# Patient Record
Sex: Male | Born: 1960 | Race: White | Hispanic: No | Marital: Single | Smoking: Never smoker
Health system: Southern US, Community
[De-identification: ages and names within clinical notes are randomized; demographics above are authoritative.]

## PROBLEM LIST (undated history)

## (undated) DIAGNOSIS — N481 Balanitis: Secondary | ICD-10-CM

## (undated) DIAGNOSIS — I10 Essential (primary) hypertension: Secondary | ICD-10-CM

## (undated) DIAGNOSIS — E119 Type 2 diabetes mellitus without complications: Secondary | ICD-10-CM

## (undated) DIAGNOSIS — K589 Irritable bowel syndrome without diarrhea: Secondary | ICD-10-CM

## (undated) DIAGNOSIS — L0291 Cutaneous abscess, unspecified: Secondary | ICD-10-CM

## (undated) DIAGNOSIS — J189 Pneumonia, unspecified organism: Secondary | ICD-10-CM

## (undated) DIAGNOSIS — L0390 Cellulitis, unspecified: Secondary | ICD-10-CM

## (undated) DIAGNOSIS — R7881 Bacteremia: Secondary | ICD-10-CM

## (undated) HISTORY — DX: Bacteremia: R78.81

## (undated) HISTORY — DX: Cutaneous abscess, unspecified: L02.91

## (undated) HISTORY — PX: TONSILLECTOMY: SUR1361

## (undated) HISTORY — DX: Cellulitis, unspecified: L03.90

## (undated) HISTORY — DX: Balanitis: N48.1

---

## 2008-01-23 ENCOUNTER — Encounter: Admission: RE | Admit: 2008-01-23 | Discharge: 2008-01-23 | Payer: Self-pay | Admitting: Family Medicine

## 2011-04-19 DIAGNOSIS — K760 Fatty (change of) liver, not elsewhere classified: Secondary | ICD-10-CM

## 2011-04-19 HISTORY — DX: Fatty (change of) liver, not elsewhere classified: K76.0

## 2013-03-21 DIAGNOSIS — I2699 Other pulmonary embolism without acute cor pulmonale: Secondary | ICD-10-CM

## 2013-03-21 HISTORY — DX: Other pulmonary embolism without acute cor pulmonale: I26.99

## 2015-04-19 DIAGNOSIS — K219 Gastro-esophageal reflux disease without esophagitis: Secondary | ICD-10-CM | POA: Diagnosis present

## 2015-11-11 ENCOUNTER — Inpatient Hospital Stay (HOSPITAL_BASED_OUTPATIENT_CLINIC_OR_DEPARTMENT_OTHER)
Admission: EM | Admit: 2015-11-11 | Discharge: 2015-11-17 | DRG: 871 | Disposition: A | Discharge status: Home Health | Payer: Medicare Other | Attending: Internal Medicine | Admitting: Internal Medicine

## 2015-11-11 ENCOUNTER — Emergency Department (HOSPITAL_BASED_OUTPATIENT_CLINIC_OR_DEPARTMENT_OTHER): Payer: Medicare Other

## 2015-11-11 ENCOUNTER — Encounter (HOSPITAL_BASED_OUTPATIENT_CLINIC_OR_DEPARTMENT_OTHER): Payer: Self-pay | Admitting: *Deleted

## 2015-11-11 DIAGNOSIS — Z794 Long term (current) use of insulin: Secondary | ICD-10-CM | POA: Diagnosis not present

## 2015-11-11 DIAGNOSIS — M793 Panniculitis, unspecified: Secondary | ICD-10-CM | POA: Diagnosis present

## 2015-11-11 DIAGNOSIS — L03311 Cellulitis of abdominal wall: Secondary | ICD-10-CM | POA: Diagnosis present

## 2015-11-11 DIAGNOSIS — L304 Erythema intertrigo: Secondary | ICD-10-CM | POA: Diagnosis present

## 2015-11-11 DIAGNOSIS — I4891 Unspecified atrial fibrillation: Secondary | ICD-10-CM | POA: Diagnosis present

## 2015-11-11 DIAGNOSIS — A4102 Sepsis due to Methicillin resistant Staphylococcus aureus: Secondary | ICD-10-CM | POA: Diagnosis present

## 2015-11-11 DIAGNOSIS — A419 Sepsis, unspecified organism: Secondary | ICD-10-CM | POA: Diagnosis present

## 2015-11-11 DIAGNOSIS — K589 Irritable bowel syndrome without diarrhea: Secondary | ICD-10-CM | POA: Diagnosis present

## 2015-11-11 DIAGNOSIS — I10 Essential (primary) hypertension: Secondary | ICD-10-CM | POA: Diagnosis present

## 2015-11-11 DIAGNOSIS — Z86711 Personal history of pulmonary embolism: Secondary | ICD-10-CM

## 2015-11-11 DIAGNOSIS — Z6839 Body mass index (BMI) 39.0-39.9, adult: Secondary | ICD-10-CM | POA: Diagnosis not present

## 2015-11-11 DIAGNOSIS — E1165 Type 2 diabetes mellitus with hyperglycemia: Secondary | ICD-10-CM | POA: Diagnosis present

## 2015-11-11 DIAGNOSIS — R7881 Bacteremia: Secondary | ICD-10-CM

## 2015-11-11 DIAGNOSIS — E119 Type 2 diabetes mellitus without complications: Secondary | ICD-10-CM

## 2015-11-11 DIAGNOSIS — R600 Localized edema: Secondary | ICD-10-CM | POA: Diagnosis present

## 2015-11-11 DIAGNOSIS — B377 Candidal sepsis: Secondary | ICD-10-CM | POA: Diagnosis present

## 2015-11-11 DIAGNOSIS — Z7901 Long term (current) use of anticoagulants: Secondary | ICD-10-CM | POA: Diagnosis not present

## 2015-11-11 DIAGNOSIS — R21 Rash and other nonspecific skin eruption: Secondary | ICD-10-CM | POA: Diagnosis present

## 2015-11-11 DIAGNOSIS — E0865 Diabetes mellitus due to underlying condition with hyperglycemia: Secondary | ICD-10-CM | POA: Diagnosis not present

## 2015-11-11 DIAGNOSIS — B9562 Methicillin resistant Staphylococcus aureus infection as the cause of diseases classified elsewhere: Secondary | ICD-10-CM | POA: Diagnosis not present

## 2015-11-11 DIAGNOSIS — Z452 Encounter for adjustment and management of vascular access device: Secondary | ICD-10-CM

## 2015-11-11 DIAGNOSIS — B379 Candidiasis, unspecified: Secondary | ICD-10-CM | POA: Diagnosis present

## 2015-11-11 DIAGNOSIS — Z7984 Long term (current) use of oral hypoglycemic drugs: Secondary | ICD-10-CM | POA: Diagnosis not present

## 2015-11-11 DIAGNOSIS — Z79899 Other long term (current) drug therapy: Secondary | ICD-10-CM | POA: Diagnosis not present

## 2015-11-11 HISTORY — DX: Irritable bowel syndrome, unspecified: K58.9

## 2015-11-11 HISTORY — DX: Essential (primary) hypertension: I10

## 2015-11-11 HISTORY — DX: Pneumonia, unspecified organism: J18.9

## 2015-11-11 HISTORY — DX: Type 2 diabetes mellitus without complications: E11.9

## 2015-11-11 HISTORY — DX: Sepsis, unspecified organism: A41.9

## 2015-11-11 LAB — APTT: APTT: 31 s (ref 24–36)

## 2015-11-11 LAB — CBC WITH DIFFERENTIAL/PLATELET
BASOS PCT: 0 %
Basophils Absolute: 0 10*3/uL (ref 0.0–0.1)
Eosinophils Absolute: 0.2 10*3/uL (ref 0.0–0.7)
Eosinophils Relative: 1 %
HEMATOCRIT: 44.6 % (ref 39.0–52.0)
Hemoglobin: 15.3 g/dL (ref 13.0–17.0)
LYMPHS ABS: 1.7 10*3/uL (ref 0.7–4.0)
Lymphocytes Relative: 12 %
MCH: 32.1 pg (ref 26.0–34.0)
MCHC: 34.3 g/dL (ref 30.0–36.0)
MCV: 93.7 fL (ref 78.0–100.0)
MONO ABS: 0.9 10*3/uL (ref 0.1–1.0)
MONOS PCT: 6 %
NEUTROS ABS: 11.1 10*3/uL — AB (ref 1.7–7.7)
Neutrophils Relative %: 81 %
Platelets: 194 10*3/uL (ref 150–400)
RBC: 4.76 MIL/uL (ref 4.22–5.81)
RDW: 12.9 % (ref 11.5–15.5)
WBC: 13.8 10*3/uL — ABNORMAL HIGH (ref 4.0–10.5)

## 2015-11-11 LAB — SEDIMENTATION RATE: Sed Rate: 13 mm/hr (ref 0–16)

## 2015-11-11 LAB — COMPREHENSIVE METABOLIC PANEL
ALBUMIN: 4.2 g/dL (ref 3.5–5.0)
ALK PHOS: 76 U/L (ref 38–126)
ALT: 96 U/L — ABNORMAL HIGH (ref 17–63)
ANION GAP: 11 (ref 5–15)
AST: 67 U/L — ABNORMAL HIGH (ref 15–41)
BILIRUBIN TOTAL: 0.8 mg/dL (ref 0.3–1.2)
BUN: 15 mg/dL (ref 6–20)
CO2: 23 mmol/L (ref 22–32)
Calcium: 9.9 mg/dL (ref 8.9–10.3)
Chloride: 101 mmol/L (ref 101–111)
Creatinine, Ser: 0.72 mg/dL (ref 0.61–1.24)
Glucose, Bld: 330 mg/dL — ABNORMAL HIGH (ref 65–99)
POTASSIUM: 4.2 mmol/L (ref 3.5–5.1)
Sodium: 135 mmol/L (ref 135–145)
Total Protein: 7.8 g/dL (ref 6.5–8.1)

## 2015-11-11 LAB — GLUCOSE, CAPILLARY
GLUCOSE-CAPILLARY: 271 mg/dL — AB (ref 65–99)
GLUCOSE-CAPILLARY: 283 mg/dL — AB (ref 65–99)
GLUCOSE-CAPILLARY: 264 mg/dL — AB (ref 65–99)
GLUCOSE-CAPILLARY: 283 mg/dL — AB (ref 65–99)
GLUCOSE-CAPILLARY: 292 mg/dL — AB (ref 65–99)

## 2015-11-11 LAB — I-STAT CG4 LACTIC ACID, ED: LACTIC ACID, VENOUS: 2.33 mmol/L — AB (ref 0.5–1.9)

## 2015-11-11 LAB — URINALYSIS, ROUTINE W REFLEX MICROSCOPIC
Bilirubin Urine: NEGATIVE
Glucose, UA: >1000 mg/dL — AB
Hgb urine dipstick: NEGATIVE
KETONES UR: 15 mg/dL — AB
LEUKOCYTES UA: NEGATIVE
NITRITE: NEGATIVE
PROTEIN: NEGATIVE mg/dL
Specific Gravity, Urine: 1.026 (ref 1.005–1.030)
pH: 6 (ref 5.0–8.0)

## 2015-11-11 LAB — URINE MICROSCOPIC-ADD ON: Bacteria, UA: NONE SEEN

## 2015-11-11 LAB — C-REACTIVE PROTEIN: CRP: 3.2 mg/dL — ABNORMAL HIGH

## 2015-11-11 LAB — LACTIC ACID, PLASMA
LACTIC ACID, VENOUS: 2.1 mmol/L — AB (ref 0.5–1.9)
Lactic Acid, Venous: 2.1 mmol/L (ref 0.5–1.9)

## 2015-11-11 LAB — HIV ANTIBODY (ROUTINE TESTING W REFLEX): HIV Screen 4th Generation wRfx: NONREACTIVE

## 2015-11-11 LAB — PROTIME-INR
INR: 1.45
Prothrombin Time: 17.8 seconds — ABNORMAL HIGH (ref 11.4–15.2)

## 2015-11-11 LAB — CBG MONITORING, ED: Glucose-Capillary: 322 mg/dL — ABNORMAL HIGH (ref 65–99)

## 2015-11-11 LAB — PROCALCITONIN: Procalcitonin: <0.1 ng/mL

## 2015-11-11 MED ORDER — INSULIN GLARGINE 100 UNIT/ML ~~LOC~~ SOLN
61.0000 [IU] | Freq: Two times a day (BID) | SUBCUTANEOUS | Status: DC
  Filled 2015-11-11 (×2): qty 0.61

## 2015-11-11 MED ORDER — PIPERACILLIN-TAZOBACTAM 3.375 G IVPB 30 MIN
3.3750 g | Freq: Once | INTRAVENOUS | Status: AC
  Administered 2015-11-11: 3.375 g via INTRAVENOUS
  Filled 2015-11-11 (×2): qty 50

## 2015-11-11 MED ORDER — SODIUM CHLORIDE 0.9 % IV BOLUS (SEPSIS)
1000.0000 mL | Freq: Once | INTRAVENOUS | Status: AC
  Administered 2015-11-11: 1000 mL via INTRAVENOUS

## 2015-11-11 MED ORDER — PIPERACILLIN-TAZOBACTAM 3.375 G IVPB 30 MIN: 3.3750 g | Freq: Three times a day (TID) | INTRAVENOUS | Status: DC

## 2015-11-11 MED ORDER — NYSTATIN 100000 UNIT/GM EX POWD
Freq: Three times a day (TID) | CUTANEOUS | Status: DC
  Administered 2015-11-11: 04:00:00 via TOPICAL
  Filled 2015-11-11: qty 15

## 2015-11-11 MED ORDER — VANCOMYCIN HCL 10 G IV SOLR
1250.0000 mg | Freq: Three times a day (TID) | INTRAVENOUS | Status: DC
  Administered 2015-11-11 – 2015-11-12 (×3): 1250 mg via INTRAVENOUS
  Filled 2015-11-11 (×5): qty 1250

## 2015-11-11 MED ORDER — VANCOMYCIN HCL IN DEXTROSE 1-5 GM/200ML-% IV SOLN
1000.0000 mg | Freq: Once | INTRAVENOUS | Status: AC
  Administered 2015-11-11: 1000 mg via INTRAVENOUS
  Filled 2015-11-11: qty 200

## 2015-11-11 MED ORDER — IBUPROFEN 200 MG PO TABS: 400.0000 mg | ORAL_TABLET | Freq: Four times a day (QID) | ORAL | Status: DC | PRN

## 2015-11-11 MED ORDER — ONDANSETRON HCL 4 MG PO TABS: 4.0000 mg | ORAL_TABLET | Freq: Four times a day (QID) | ORAL | Status: DC | PRN

## 2015-11-11 MED ORDER — LOSARTAN POTASSIUM 50 MG PO TABS
50.0000 mg | ORAL_TABLET | Freq: Every day | ORAL | Status: DC
  Administered 2015-11-11 – 2015-11-17 (×7): 50 mg via ORAL
  Filled 2015-11-11 (×7): qty 1

## 2015-11-11 MED ORDER — INSULIN ASPART 100 UNIT/ML ~~LOC~~ SOLN
0.0000 [IU] | Freq: Three times a day (TID) | SUBCUTANEOUS | Status: DC
  Administered 2015-11-11 – 2015-11-12 (×4): 11 [IU] via SUBCUTANEOUS
  Administered 2015-11-12: 7 [IU] via SUBCUTANEOUS
  Administered 2015-11-13: 11 [IU] via SUBCUTANEOUS
  Administered 2015-11-13: 4 [IU] via SUBCUTANEOUS
  Administered 2015-11-13: 7 [IU] via SUBCUTANEOUS
  Administered 2015-11-14: 4 [IU] via SUBCUTANEOUS
  Administered 2015-11-14 (×2): 7 [IU] via SUBCUTANEOUS
  Administered 2015-11-15: 4 [IU] via SUBCUTANEOUS
  Administered 2015-11-15: 7 [IU] via SUBCUTANEOUS
  Administered 2015-11-15 – 2015-11-16 (×2): 4 [IU] via SUBCUTANEOUS
  Administered 2015-11-16: 3 [IU] via SUBCUTANEOUS
  Administered 2015-11-16 – 2015-11-17 (×2): 4 [IU] via SUBCUTANEOUS
  Administered 2015-11-17: 3 [IU] via SUBCUTANEOUS

## 2015-11-11 MED ORDER — PIPERACILLIN-TAZOBACTAM 3.375 G IVPB
3.3750 g | Freq: Three times a day (TID) | INTRAVENOUS | Status: DC
  Administered 2015-11-11 – 2015-11-12 (×4): 3.375 g via INTRAVENOUS
  Filled 2015-11-11 (×6): qty 50

## 2015-11-11 MED ORDER — SODIUM CHLORIDE 0.9 % IV BOLUS (SEPSIS): 1000.0000 mL | Freq: Once | INTRAVENOUS | Status: DC

## 2015-11-11 MED ORDER — ACETAMINOPHEN 325 MG PO TABS
650.0000 mg | ORAL_TABLET | Freq: Four times a day (QID) | ORAL | Status: DC | PRN
  Administered 2015-11-12: 650 mg via ORAL
  Filled 2015-11-11: qty 2

## 2015-11-11 MED ORDER — RIVAROXABAN 20 MG PO TABS
20.0000 mg | ORAL_TABLET | Freq: Every day | ORAL | Status: DC
  Administered 2015-11-11 – 2015-11-17 (×7): 20 mg via ORAL
  Filled 2015-11-11 (×7): qty 1

## 2015-11-11 MED ORDER — ACETAMINOPHEN 325 MG PO TABS
ORAL_TABLET | ORAL | Status: AC
  Administered 2015-11-11: 650 mg
  Filled 2015-11-11: qty 2

## 2015-11-11 MED ORDER — SODIUM CHLORIDE 0.9 % IV BOLUS (SEPSIS)
1000.0000 mL | Freq: Once | INTRAVENOUS | Status: DC
Start: 2015-11-11 — End: 2015-11-11

## 2015-11-11 MED ORDER — SODIUM CHLORIDE 0.9 % IV BOLUS (SEPSIS): 500.0000 mL | Freq: Once | INTRAVENOUS | Status: DC

## 2015-11-11 MED ORDER — ENOXAPARIN SODIUM 60 MG/0.6ML ~~LOC~~ SOLN
60.0000 mg | SUBCUTANEOUS | Status: DC
  Administered 2015-11-11: 60 mg via SUBCUTANEOUS
  Filled 2015-11-11: qty 0.6

## 2015-11-11 MED ORDER — IBUPROFEN 400 MG PO TABS
600.0000 mg | ORAL_TABLET | Freq: Once | ORAL | Status: AC
  Administered 2015-11-11: 600 mg via ORAL
  Filled 2015-11-11: qty 1

## 2015-11-11 MED ORDER — INSULIN ASPART 100 UNIT/ML ~~LOC~~ SOLN
0.0000 [IU] | Freq: Three times a day (TID) | SUBCUTANEOUS | Status: DC
  Administered 2015-11-11: 5 [IU] via SUBCUTANEOUS

## 2015-11-11 MED ORDER — PIPERACILLIN-TAZOBACTAM 3.375 G IVPB
3.3750 g | Freq: Three times a day (TID) | INTRAVENOUS | Status: DC
  Filled 2015-11-11 (×2): qty 50

## 2015-11-11 MED ORDER — INSULIN GLARGINE 100 UNIT/ML ~~LOC~~ SOLN
65.0000 [IU] | Freq: Two times a day (BID) | SUBCUTANEOUS | Status: DC
  Filled 2015-11-11: qty 0.65

## 2015-11-11 MED ORDER — MORPHINE SULFATE (PF) 4 MG/ML IV SOLN
4.0000 mg | Freq: Once | INTRAVENOUS | Status: AC
  Administered 2015-11-11: 4 mg via INTRAVENOUS
  Filled 2015-11-11: qty 1

## 2015-11-11 MED ORDER — INSULIN GLARGINE 100 UNIT/ML ~~LOC~~ SOLN
65.0000 [IU] | Freq: Two times a day (BID) | SUBCUTANEOUS | Status: DC
  Administered 2015-11-11 – 2015-11-12 (×3): 65 [IU] via SUBCUTANEOUS
  Filled 2015-11-11 (×4): qty 0.65

## 2015-11-11 MED ORDER — ACETAMINOPHEN 650 MG RE SUPP: 650.0000 mg | Freq: Four times a day (QID) | RECTAL | Status: DC | PRN

## 2015-11-11 MED ORDER — ONDANSETRON HCL 4 MG/2ML IJ SOLN: 4.0000 mg | Freq: Four times a day (QID) | INTRAMUSCULAR | Status: DC | PRN

## 2015-11-11 MED ORDER — INSULIN ASPART 100 UNIT/ML ~~LOC~~ SOLN
4.0000 [IU] | Freq: Three times a day (TID) | SUBCUTANEOUS | Status: DC
  Administered 2015-11-11 – 2015-11-17 (×19): 4 [IU] via SUBCUTANEOUS

## 2015-11-11 MED ORDER — SODIUM CHLORIDE 0.9 % IV SOLN
INTRAVENOUS | Status: DC
  Administered 2015-11-11 – 2015-11-15 (×6): via INTRAVENOUS

## 2015-11-11 MED ORDER — OXYCODONE-ACETAMINOPHEN 5-325 MG PO TABS: 2.0000 | ORAL_TABLET | Freq: Four times a day (QID) | ORAL | Status: DC | PRN

## 2015-11-11 MED ORDER — SODIUM CHLORIDE 0.9% FLUSH
3.0000 mL | Freq: Two times a day (BID) | INTRAVENOUS | Status: DC
  Administered 2015-11-12 – 2015-11-14 (×4): 3 mL via INTRAVENOUS

## 2015-11-11 NOTE — ED Provider Notes (Signed)
[facility] Provider Note   CSN: [id number] Arrival date & time: 11/11/15  4098  First Provider Contact:  None       History   Chief Complaint Chief Complaint  Patient presents with  . Rash    HPI Bradley Mcclure is a 55 y.o. male.  HPI  This is a 55 year old male with a history of hypertension and diabetes who presents with a rash. Patient reports he noted a painful rash under his right pannus 2-3 days ago. He has used a generic over-the-counter antibiotic ointment that did not seem to help. He subsequently used Neosporin which seem to worsen pain and redness at the site. Currently he rates his pain at 5 out of 10. Denies any fevers at home.  Past Medical History:  Diagnosis Date  . Diabetes mellitus without complication (HCC)   . Hypertension   . IBS (irritable bowel syndrome)     Patient Active Problem List   Diagnosis Date Noted  . Cellulitis 11/11/2015    Past Surgical History:  Procedure Laterality Date  . TONSILLECTOMY         Home Medications    Prior to Admission medications   Medication Sig Start Date End Date Taking? Authorizing Provider  glucose blood test strip 1 each by Other route as needed for other. Use as instructed   Yes Historical Provider, MD  insulin glargine (LANTUS) 100 UNIT/ML injection Inject 61 Units into the skin at bedtime.   Yes Historical Provider, MD  Liraglutide (VICTOZA) 18 MG/3ML SOPN Inject into the skin.   Yes Historical Provider, MD  losartan (COZAAR) 50 MG tablet Take 50 mg by mouth daily.   Yes Historical Provider, MD  metFORMIN (GLUCOPHAGE) 500 MG tablet Take by mouth 2 (two) times daily with a meal.   Yes Historical Provider, MD    Family History History reviewed. No pertinent family history.  Social History Social History  Substance Use Topics  . Smoking status: Never Smoker  . Smokeless tobacco: Not on file  . Alcohol use No     Allergies   Review of patient's allergies indicates no known  allergies.   Review of Systems Review of Systems  Constitutional: Positive for chills. Negative for fever.  Respiratory: Negative for cough and shortness of breath.   Cardiovascular: Negative for chest pain.  Gastrointestinal: Positive for abdominal pain. Negative for nausea and vomiting.  Musculoskeletal: Negative for arthralgias.  Skin: Positive for rash. Negative for wound.  All other systems reviewed and are negative.    Physical Exam Updated Vital Signs BP 137/65   Pulse (!) 143   Temp 101.8 F (38.8 C) (Oral)   Resp 22   Ht 6\' 1"  (1.854 m)   Wt 298 lb (135.2 kg)   SpO2 94%   BMI 39.32 kg/m   Physical Exam  Constitutional: He is oriented to person, place, and time. No distress.  Obese  HENT:  Head: Normocephalic and atraumatic.  Cardiovascular: Regular rhythm and normal heart sounds.   No murmur heard. Tachycardia  Pulmonary/Chest: Effort normal and breath sounds normal. No respiratory distress. He has no wheezes.  Abdominal: Soft. Bowel sounds are normal. There is no tenderness.  Musculoskeletal: He exhibits no edema.  Lymphadenopathy:    He has no cervical adenopathy.  Neurological: He is alert and oriented to person, place, and time.  Skin: Skin is warm.  Erythema with satellite lesions noted bilaterally underneath abdominal pannus right greater than left, there is adjacent blanching erythema, warmth,  tenderness to palpation, no fluctuance noted  Psychiatric: He has a normal mood and affect.  Nursing note and vitals reviewed.    ED Treatments / Results  Labs (all labs ordered are listed, but only abnormal results are displayed) Labs Reviewed  CBC WITH DIFFERENTIAL/PLATELET - Abnormal; Notable for the following:       Result Value   WBC 13.8 (*)    Neutro Abs 11.1 (*)    All other components within normal limits  COMPREHENSIVE METABOLIC PANEL - Abnormal; Notable for the following:    Glucose, Bld 330 (*)    AST 67 (*)    ALT 96 (*)    All other  components within normal limits  URINALYSIS, ROUTINE W REFLEX MICROSCOPIC (NOT AT [facility]) - Abnormal; Notable for the following:    Glucose, UA >1000 (*)    Ketones, ur 15 (*)    All other components within normal limits  URINE MICROSCOPIC-ADD ON - Abnormal; Notable for the following:    Squamous Epithelial / LPF 0-5 (*)    All other components within normal limits  I-STAT CG4 LACTIC ACID, ED - Abnormal; Notable for the following:    Lactic Acid, Venous 2.33 (*)    All other components within normal limits  CULTURE, BLOOD (ROUTINE X 2)  CULTURE, BLOOD (ROUTINE X 2)  URINE CULTURE    EKG  EKG Interpretation  Date/Time:  Wednesday November 11 2015 00:50:28 EDT Ventricular Rate:  140 PR Interval:    QRS Duration: 97 QT Interval:  279 QTC Calculation: 426 R Axis:   85 Text Interpretation:  Sinus tachycardia Borderline ST elevation, anterior leads No prior for comparison Confirmed by Wilkie Aye  MD, COURTNEY ([id number]) on 11/11/2015 1:18:07 AM       Radiology Dg Chest 2 View  Result Date: 11/11/2015 CLINICAL DATA:  55 year old male with fever EXAM: CHEST  2 VIEW COMPARISON:  None. FINDINGS: Two views of the chest demonstrate shallow inspiration with mild increased interstitial prominence. There is no focal consolidation, pleural effusion, or pneumothorax. The cardiac silhouette is within normal limits with no acute osseous pathology identified. IMPRESSION: No active cardiopulmonary disease. Electronically Signed   By: Elgie Collard M.D.   On: 11/11/2015 02:14   Procedures Procedures (including critical care time)  CRITICAL CARE Performed by: Shon Baton   Total critical care time: 20 minutes  Critical care time was exclusive of separately billable procedures and treating other patients.  Critical care was necessary to treat or prevent imminent or life-threatening deterioration.  Critical care was time spent personally by me on the following activities: development of  treatment plan with patient and/or surrogate as well as nursing, discussions with consultants, evaluation of patient's response to treatment, examination of patient, obtaining history from patient or surrogate, ordering and performing treatments and interventions, ordering and review of laboratory studies, ordering and review of radiographic studies, pulse oximetry and re-evaluation of patient's condition.   Medications Ordered in ED Medications  nystatin (MYCOSTATIN/NYSTOP) topical powder (not administered)  vancomycin (VANCOCIN) IVPB 1000 mg/200 mL premix (1,000 mg Intravenous New Bag/Given 11/11/15 0216)  sodium chloride 0.9 % bolus 1,000 mL (not administered)  morphine 4 MG/ML injection 4 mg (4 mg Intravenous Given 11/11/15 0110)  sodium chloride 0.9 % bolus 1,000 mL (0 mLs Intravenous Stopped 11/11/15 0208)  piperacillin-tazobactam (ZOSYN) IVPB 3.375 g (0 g Intravenous Stopped 11/11/15 0208)  acetaminophen (TYLENOL) 325 MG tablet (650 mg  Given 11/11/15 0125)  sodium chloride 0.9 % bolus 1,000 mL (1,000  mLs Intravenous New Bag/Given 11/11/15 0221)  ibuprofen (ADVIL,MOTRIN) tablet 600 mg (600 mg Oral Given 11/11/15 0249)     Initial Impression / Assessment and Plan / ED Course  I have reviewed the triage vital signs and the nursing notes.  Pertinent labs & imaging results that were available during my care of the patient were reviewed by me and considered in my medical decision making (see chart for details).  Clinical Course   Patient presents with a rash. Over the last 2-3 days. Denies systemic symptoms. Nontoxic on exam but vital signs notable for pulse of 147. Initial temperature 99.6. Repeat temperature 102.7. Sepsis alert was initiated. Rash appears to be candidal with superimposed cellulitis/Panniculitis. Lactate 2.33.  Patient is not hypotensive. He was initially given 1 normal saline bolus. He remained tachycardic and febrile. He subsequently was given Tylenol and ibuprofen and 2  additional fluid boluses were ordered. He was given vancomycin and Zosyn. White blood count 13.8. He is hyperglycemic. No anion gap. On recheck, he remained stable. Remains tachycardic but remains febrile. Will admit for IV antibiotics.  2:59 AM  Repeat sepsis assessment completed.     Final Clinical Impressions(s) / ED Diagnoses   Final diagnoses:  Cellulitis of abdominal wall  Candida-induced panniculitis    New Prescriptions New Prescriptions   No medications on file     Shon Baton, MD 11/11/15 0300

## 2015-11-11 NOTE — Progress Notes (Signed)
Lestine Box, MD notified of lactic acid level of 2.1 . Will continue to monitor

## 2015-11-11 NOTE — H&P (Signed)
History and Physical    Bradley Mcclure TDV:[id number] DOB: May 27, 1960 DOA: 11/11/2015  Referring MD/NP/PA:   PCP: Pcp Not In System   Patient coming from:  The patient is coming from home.  At baseline, pt is independent for most of ADL.  Chief Complaint: painful rashes  HPI: Bradley Mcclure is a 55 y.o. male with medical history significant of hypertension, diabetes mellitus, IBS, who presents with painful rashes.  Pt states that he has painful rashed under his bilateral pannus areas for 3 days ago. He has used a generic over-the-counter antibiotic ointment that did not seem to help. He subsequently used Neosporin which seem to worsen pain and redness. He states that he developed fever and chills. His pain is constant, 5 out of 10 in severity, nonradiating. He states that he has similar rashed breakout almost every year in this season. He does not have chest pain, cough, shortness breath, nausea, vomiting, diarrhea, symptoms of UTI.  ED Course: pt was found to have WBC 13.8, lactate 2.33, negative urinalysis, temperature while a 2.7, tachycardia with heart rate up to 140s, tachypnea, characterize renal function okay, chest x-ray is negative for acute abnormalities. Patient is admitted to telemetry bed as inpatient for further eval and treatment.  Review of Systems:   General: has fevers, chills, no changes in body weight, has fatigue HEENT: no blurry vision, hearing changes or sore throat Pulm: no dyspnea, coughing, wheezing CV: no chest pain, no palpitations Abd: no nausea, vomiting, abdominal pain, diarrhea, constipation GU: no dysuria, burning on urination, increased urinary frequency, hematuria  Ext: no leg edema Neuro: no unilateral weakness, numbness, or tingling, no vision change or hearing loss Skin: has painful rashes in bilateral pannus areas. MSK: No muscle spasm, no deformity, no limitation of range of movement in spin Heme: No easy bruising.  Travel history: No  recent long distant travel.  Allergy: No Known Allergies  Past Medical History:  Diagnosis Date  . Diabetes mellitus without complication ([place])   . Hypertension   . IBS (irritable bowel syndrome)     Past Surgical History:  Procedure Laterality Date  . TONSILLECTOMY      Social History:  reports that he has never smoked. He does not have any smokeless tobacco history on file. He reports that he does not drink alcohol or use drugs.  Family History:  Family History  Problem Relation Age of Onset  . Myasthenia gravis Mother   . Diabetes Mellitus I Brother      Prior to Admission medications   Medication Sig Start Date End Date Taking? Authorizing Provider  glucose blood test strip 1 each by Other route as needed for other. Use as instructed   Yes Historical Provider, MD  insulin glargine (LANTUS) 100 UNIT/ML injection Inject 61 Units into the skin at bedtime.   Yes Historical Provider, MD  Liraglutide (VICTOZA) 18 MG/3ML SOPN Inject into the skin.   Yes Historical Provider, MD  losartan (COZAAR) 50 MG tablet Take 50 mg by mouth daily.   Yes Historical Provider, MD  metFORMIN (GLUCOPHAGE) 500 MG tablet Take by mouth 2 (two) times daily with a meal.   Yes Historical Provider, MD    Physical Exam: Vitals:   11/11/15 0330 11/11/15 0338 11/11/15 0341 11/11/15 0438  BP: 142/75 140/76  (!) 143/66  Pulse: (!) 126 (!) 123  (!) 127  Resp: 22 24  (!) 25  Temp:   101.4 F (38.6 C) (!) 100.4 F (38 C)  TempSrc:  Oral Oral  SpO2: (!) 88% 95%  100%  Weight:    (!) 136.2 kg (300 lb 3.2 oz)  Height:    6' 1"  (1.854 m)   General: Not in acute distress HEENT:       Eyes: PERRL, EOMI, no scleral icterus.       ENT: No discharge from the ears and nose, no pharynx injection, no tonsillar enlargement.        Neck: No JVD, no bruit, no mass felt. Heme: No neck lymph node enlargement. Cardiac: S1/S2, RRR, No murmurs, No gallops or rubs. Pulm: Good air movement bilaterally. No rales,  wheezing, rhonchi or rubs. Abd: Soft, nondistended, nontender, no rebound pain, no organomegaly, BS present. GU: No hematuria Ext: No pitting leg edema bilaterally. 2+DP/PT pulse bilaterally. Musculoskeletal: No joint deformities, No joint redness or warmth, no limitation of ROM in spin. Skin: has erythematous rashes, with satellite lesions, underneath abdominal pannus right greater than left, has adjacent skin erythema, warmth, tenderness to palpation, no fluctuance. Neuro: Alert, oriented X3, cranial nerves II-XII grossly intact, moves all extremities normally.  Psych: Patient is not psychotic, no suicidal or hemocidal ideation.  Labs on Admission: I have personally reviewed following labs and imaging studies  CBC:  Recent Labs Lab 11/11/15 0103  WBC 13.8*  NEUTROABS 11.1*  HGB 15.3  HCT 44.6  MCV 93.7  PLT 426   Basic Metabolic Panel:  Recent Labs Lab 11/11/15 0103  NA 135  K 4.2  CL 101  CO2 23  GLUCOSE 330*  BUN 15  CREATININE 0.72  CALCIUM 9.9   GFR: Estimated Creatinine Clearance: 156.4 mL/min (by C-G formula based on SCr of 0.8 mg/dL). Liver Function Tests:  Recent Labs Lab 11/11/15 0103  AST 67*  ALT 96*  ALKPHOS 76  BILITOT 0.8  PROT 7.8  ALBUMIN 4.2   No results for input(s): LIPASE, AMYLASE in the last 168 hours. No results for input(s): AMMONIA in the last 168 hours. Coagulation Profile: No results for input(s): INR, PROTIME in the last 168 hours. Cardiac Enzymes: No results for input(s): CKTOTAL, CKMB, CKMBINDEX, TROPONINI in the last 168 hours. BNP (last 3 results) No results for input(s): PROBNP in the last 8760 hours. HbA1C: No results for input(s): HGBA1C in the last 72 hours. CBG:  Recent Labs Lab 11/11/15 0338 11/11/15 0436  GLUCAP 322* 271*   Lipid Profile: No results for input(s): CHOL, HDL, LDLCALC, TRIG, CHOLHDL, LDLDIRECT in the last 72 hours. Thyroid Function Tests: No results for input(s): TSH, T4TOTAL, FREET4, T3FREE,  THYROIDAB in the last 72 hours. Anemia Panel: No results for input(s): VITAMINB12, FOLATE, FERRITIN, TIBC, IRON, RETICCTPCT in the last 72 hours. Urine analysis:    Component Value Date/Time   COLORURINE YELLOW 11/11/2015 0200   APPEARANCEUR CLEAR 11/11/2015 0200   LABSPEC 1.026 11/11/2015 0200   PHURINE 6.0 11/11/2015 0200   GLUCOSEU >1000 (A) 11/11/2015 0200   HGBUR NEGATIVE 11/11/2015 0200   BILIRUBINUR NEGATIVE 11/11/2015 0200   KETONESUR 15 (A) 11/11/2015 0200   PROTEINUR NEGATIVE 11/11/2015 0200   NITRITE NEGATIVE 11/11/2015 0200   LEUKOCYTESUR NEGATIVE 11/11/2015 0200   Sepsis Labs: @LABRCNTIP (procalcitonin:4,lacticidven:4) )No results found for this or any previous visit (from the past 240 hour(s)).   Radiological Exams on Admission: Dg Chest 2 View  Result Date: 11/11/2015 CLINICAL DATA:  55 year old male with fever EXAM: CHEST  2 VIEW COMPARISON:  None. FINDINGS: Two views of the chest demonstrate shallow inspiration with mild increased interstitial prominence. There is no  focal consolidation, pleural effusion, or pneumothorax. The cardiac silhouette is within normal limits with no acute osseous pathology identified. IMPRESSION: No active cardiopulmonary disease. Electronically Signed   By: Anner Crete M.D.   On: 11/11/2015 02:14    EKG: Independently reviewed. Sinus rhythm, tachycardia, QTC 426, nonspecific T-wave change.  Assessment/Plan Principal Problem:   Abdominal wall cellulitis Active Problems:   Hypertension   Diabetes mellitus without complication (HCC)   Sepsis ([place])   Rash   Candida-induced panniculitis   Abdominal wall cellulitis and sepsis: pt seems to havesuperimposed abdominal wall cellulitis on on the top of Candida-induced panniculitis. He is septic with leukocytosis, fever, elevated lactate at 2.33, tachycardia and tachypnea. Currently hemodynamically stable.  -will admit to tele bed as inpt. - Empiric antimicrobial treatment with  vancomycin and Zosyn per pharmacy - PRN Percocet and ibuprofen for pain - Blood cultures x 2  - ESR and CRP - wound care consult - will get Procalcitonin and trend lactic acid levels per sepsis protocol. - IVF: 3.0L of NS bolus in ED, followed by 125 cc/h  Candida-induced panniculitis: -Nystatin power tid  HTN: -continue Losartan  DM-II: Last A1c not on record. His medication list has Lantus 61 unit daily, but patient states that he is taking Lantus 61 unit twice a day at home. He is also taking metformin and victoza. CBG 330 on admission. -will continue Lantus 61 U bid -SSI -Check A1c  DVT ppx: SQ Lovenox Code Status: Full code Family Communication: None at bed side.  Disposition Plan:  Anticipate discharge back to previous home environment Consults called:  none Admission status:  Inpatient/tele   Date of Service 11/11/2015    Ivor Costa [facility] Pager [phone]  If 7PM-7AM, please contact night-coverage [email or web address] Password [email or web address] 11/11/2015, 5:15 AM

## 2015-11-11 NOTE — ED Notes (Signed)
Pt ambulatory to b/r, steady gait, from [facility] stretcher prior to leaving/transfer.

## 2015-11-11 NOTE — Progress Notes (Signed)
Patient seen and examined. Admitted after midnight secondary to sepsis due to abdominal panniculitis and cellulitis. Without nausea, CP or SOB currently. Lactic acid much better after IVF's (2.1 now). Will continue broad spectrum antibiotics and follow clinical response. Please refer to H&P written by Dr. Clyde Lundborg for further info/details on admission.  Plan: -will follow lactic acid and continue IVF's -insulin therapy adjusted (lantus BID 65 units) along with SSI and meal coverage -follow A1C -appreciate wound care assessment and rec's   Bradley Mcclure [phone]

## 2015-11-11 NOTE — Progress Notes (Signed)
CBG recheck 283.  NP notified, no new orders at this time.

## 2015-11-11 NOTE — Progress Notes (Signed)
Pharmacy Antibiotic Note  Bradley Mcclure is a 55 y.o. male admitted on 11/11/2015 with abdominal wall cellulitis.  Pharmacy has been consulted for Vancomycin and Zosyn dosing.  Vancomycin 2 g IV total given thus far tongiht  Plan: Vancomycin 1250 mg IV q8h Zosyn 3.375 g IV q8h   Height: 6\' 1"  (185.4 cm) Weight: (!) 300 lb 3.2 oz (136.2 kg) IBW/kg (Calculated) : 79.9  Temp (24hrs), Avg:101.2 F (38.4 C), Min:99.6 F (37.6 C), Max:102.7 F (39.3 C)   Recent Labs Lab 11/11/15 0103 11/11/15 0107 11/11/15 0511  WBC 13.8*  --   --   CREATININE 0.72  --   --   LATICACIDVEN  --  2.33* 2.1*    Estimated Creatinine Clearance: 156.4 mL/min (by C-G formula based on SCr of 0.8 mg/dL).    No Known Allergies   Bradley, Mcclure 11/11/2015 5:57 AM

## 2015-11-11 NOTE — Progress Notes (Signed)
This is a no charge note   Transfer from [facility] per Dr. Wilkie Aye.  55 year old male withpast medical history of IBS, hypertension, diabetes mellitus, who presents with fever and rashes.  Pt has painful rash under his right pannus for 3 days. He seems to have cellulitis in the setting of Candida infection per DEP. Patient is septic with lactate of 2.33, WBC 13.8, temperature 102.7, tachycardia with heart rate up to 140s, tachypnea, electrolytes and renal function okay, negative urinalysis, negative chest x-ray. Pt will receive 3L NS. IV vancomycin and Zosyn was started. Pt is accepted to tele bed for obs.  Lorretta Harp, MD  [facility] Pager [phone]  If 7PM-7AM, please contact night-coverage [email or web address] Password [facility] 11/11/2015, 2:58 AM

## 2015-11-11 NOTE — Progress Notes (Signed)
Utilization review completed.  

## 2015-11-11 NOTE — ED Triage Notes (Signed)
Pt c/o rash to abd  X 3 days

## 2015-11-11 NOTE — Consult Note (Addendum)
WOC Nurse wound consult note Reason for Consult: Consult requested for abd fold;Pt with generalized cellulitis and intertrigo. Wound type: Red macerated skin, generalized erythremia, painful to touch across entire lower abd fold locations. Drainage (amount, consistency, odor) Small amt yellow drainage, no odor Dressing procedure/placement/frequency: Interdry silver-impregnated fabric to wick drainage away from skin and provide antimicrobial benefits. Pt is on systemic antibiotic coverage. Discussed plan of care with patient and provided instructions for staff nurse use.  This should remain in place for 5 days for optimal plan of care. Please re-consult if further assistance is needed.  Thank-you,  Cammie Mcgee MSN, RN, CWOCN, [place], CNS [phone]

## 2015-11-11 NOTE — Progress Notes (Signed)
MD Clyde Lundborg notified of patient's lactic acid level 2.1.  RN will continue to monitor.

## 2015-11-11 NOTE — ED Notes (Signed)
Dr. Wilkie Aye at [facility] to re-assess pt, SPO2 88% 2L, increased to 4L [place], SPO2 95%. LS CTA. IVF bolus continues, vanc continues, CBG 322, [facility] here at [facility], pt alert, NAD, calm, interactive, denies need for pain med.

## 2015-11-11 NOTE — Progress Notes (Signed)
Patient had two incontinent episodes, inter dry Agi reordered after each episode, will continue to monitor

## 2015-11-11 NOTE — Progress Notes (Signed)
Inpatient Diabetes Program Recommendations  AACE/ADA: New Consensus Statement on Inpatient Glycemic Control (2015)  Target Ranges:  Prepandial:   less than 140 mg/dL      Peak postprandial:   less than 180 mg/dL (1-2 hours)      Critically ill patients:  140 - 180 mg/dL   Results for Bradley Mcclure, Bradley Mcclure (MRN [id number]) as of 11/11/2015 12:13  Ref. Range 11/11/2015 03:38 11/11/2015 04:36 11/11/2015 06:18 11/11/2015 11:18  Glucose-Capillary Latest Ref Range: 65 - 99 mg/dL 262 (H) 035 (H) 597 (H) 264 (H)    Review of Glycemic Control  Diabetes history: DM2 Outpatient Diabetes medications: Lantus 61 units bid + Victoza qd + Metformin 500 mg bid Current orders for Inpatient glycemic control: Lantus 65 units bid + Novolog 4 units tid meal coverage + Novolog correction 0-20 units tid  Inpatient Diabetes Program Recommendations:  Noted A1c pending. Spoke with nurse concerning Lantus dose for today (MAR has current dose discontinued and new dose scheduled to start hs). Reviewed patient needs dose to start this am. Nurse to call MD to clarify. Will follow patient while hospitalized.  Thank you, [name]. Dezeray Puccio, RN, MSN, CDE Inpatient Glycemic Control Team Team Pager [phone] (8am-5pm) 11/11/2015 12:21 PM

## 2015-11-12 DIAGNOSIS — Z794 Long term (current) use of insulin: Secondary | ICD-10-CM

## 2015-11-12 DIAGNOSIS — R7881 Bacteremia: Secondary | ICD-10-CM

## 2015-11-12 DIAGNOSIS — E0865 Diabetes mellitus due to underlying condition with hyperglycemia: Secondary | ICD-10-CM

## 2015-11-12 DIAGNOSIS — A4102 Sepsis due to Methicillin resistant Staphylococcus aureus: Secondary | ICD-10-CM

## 2015-11-12 LAB — BLOOD CULTURE ID PANEL (REFLEXED)
Acinetobacter baumannii: NOT DETECTED
CANDIDA KRUSEI: NOT DETECTED
CANDIDA PARAPSILOSIS: NOT DETECTED
CANDIDA TROPICALIS: NOT DETECTED
CARBAPENEM RESISTANCE: NOT DETECTED
Candida albicans: NOT DETECTED
Candida glabrata: NOT DETECTED
ENTEROBACTERIACEAE SPECIES: NOT DETECTED
ENTEROCOCCUS SPECIES: NOT DETECTED
Enterobacter cloacae complex: NOT DETECTED
Escherichia coli: NOT DETECTED
Haemophilus influenzae: NOT DETECTED
KLEBSIELLA OXYTOCA: NOT DETECTED
KLEBSIELLA PNEUMONIAE: NOT DETECTED
LISTERIA MONOCYTOGENES: NOT DETECTED
Methicillin resistance: DETECTED — AB
Neisseria meningitidis: NOT DETECTED
Proteus species: NOT DETECTED
Pseudomonas aeruginosa: NOT DETECTED
SERRATIA MARCESCENS: NOT DETECTED
STAPHYLOCOCCUS AUREUS BCID: DETECTED — AB
STAPHYLOCOCCUS SPECIES: DETECTED — AB
Streptococcus agalactiae: NOT DETECTED
Streptococcus pneumoniae: NOT DETECTED
Streptococcus pyogenes: NOT DETECTED
Streptococcus species: NOT DETECTED
VANCOMYCIN RESISTANCE: NOT DETECTED

## 2015-11-12 LAB — VANCOMYCIN, TROUGH: Vancomycin Tr: 6 ug/mL — ABNORMAL LOW (ref 15–20)

## 2015-11-12 LAB — CBC
HCT: 40.1 % (ref 39.0–52.0)
HEMOGLOBIN: 13.6 g/dL (ref 13.0–17.0)
MCH: 31.8 pg (ref 26.0–34.0)
MCHC: 33.9 g/dL (ref 30.0–36.0)
MCV: 93.7 fL (ref 78.0–100.0)
PLATELETS: 151 10*3/uL (ref 150–400)
RBC: 4.28 MIL/uL (ref 4.22–5.81)
RDW: 13 % (ref 11.5–15.5)
WBC: 15 10*3/uL — ABNORMAL HIGH (ref 4.0–10.5)

## 2015-11-12 LAB — BASIC METABOLIC PANEL
ANION GAP: 7 (ref 5–15)
BUN: 9 mg/dL (ref 6–20)
CALCIUM: 8.8 mg/dL — AB (ref 8.9–10.3)
CO2: 26 mmol/L (ref 22–32)
CREATININE: 0.7 mg/dL (ref 0.61–1.24)
Chloride: 100 mmol/L — ABNORMAL LOW (ref 101–111)
GLUCOSE: 258 mg/dL — AB (ref 65–99)
Potassium: 3.9 mmol/L (ref 3.5–5.1)
Sodium: 133 mmol/L — ABNORMAL LOW (ref 135–145)

## 2015-11-12 LAB — HEMOGLOBIN A1C
HEMOGLOBIN A1C: 9.8 % — AB (ref 4.8–5.6)
MEAN PLASMA GLUCOSE: 235 mg/dL

## 2015-11-12 LAB — GLUCOSE, CAPILLARY
GLUCOSE-CAPILLARY: 239 mg/dL — AB (ref 65–99)
GLUCOSE-CAPILLARY: 289 mg/dL — AB (ref 65–99)
GLUCOSE-CAPILLARY: 273 mg/dL — AB (ref 65–99)
GLUCOSE-CAPILLARY: 235 mg/dL — AB (ref 65–99)

## 2015-11-12 LAB — URINE CULTURE: CULTURE: NO GROWTH

## 2015-11-12 LAB — LACTIC ACID, PLASMA: LACTIC ACID, VENOUS: 1.5 mmol/L (ref 0.5–1.9)

## 2015-11-12 MED ORDER — VANCOMYCIN HCL 10 G IV SOLR
1750.0000 mg | Freq: Three times a day (TID) | INTRAVENOUS | Status: DC
  Administered 2015-11-12 – 2015-11-13 (×4): 1750 mg via INTRAVENOUS
  Filled 2015-11-12 (×6): qty 1750

## 2015-11-12 MED ORDER — INSULIN GLARGINE 100 UNIT/ML ~~LOC~~ SOLN
70.0000 [IU] | Freq: Two times a day (BID) | SUBCUTANEOUS | Status: DC
Start: 2015-11-12 — End: 2015-11-16
  Administered 2015-11-12 – 2015-11-16 (×8): 70 [IU] via SUBCUTANEOUS
  Filled 2015-11-12 (×9): qty 0.7

## 2015-11-12 MED ORDER — VANCOMYCIN HCL 10 G IV SOLR
1750.0000 mg | Freq: Three times a day (TID) | INTRAVENOUS | Status: DC
  Filled 2015-11-12: qty 1750

## 2015-11-12 MED ORDER — VANCOMYCIN HCL 10 G IV SOLR
1750.0000 mg | Freq: Three times a day (TID) | INTRAVENOUS | Status: DC
  Filled 2015-11-12 (×2): qty 1750

## 2015-11-12 MED ORDER — VANCOMYCIN HCL 10 G IV SOLR
2000.0000 mg | INTRAVENOUS | Status: AC
  Administered 2015-11-12: 2000 mg via INTRAVENOUS
  Filled 2015-11-12: qty 2000

## 2015-11-12 NOTE — Progress Notes (Signed)
Inpatient Diabetes Program Recommendations  AACE/ADA: New Consensus Statement on Inpatient Glycemic Control (2015)  Target Ranges:  Prepandial:   less than 140 mg/dL      Peak postprandial:   less than 180 mg/dL (1-2 hours)      Critically ill patients:  140 - 180 mg/dL   Results for Bradley Mcclure, Bradley Mcclure (MRN [id number]) as of 11/12/2015 09:35  Ref. Range 11/11/2015 06:18 11/11/2015 11:18 11/11/2015 16:46 11/11/2015 21:38 11/12/2015 06:25  Glucose-Capillary Latest Ref Range: 65 - 99 mg/dL 366 (H) 294 (H) 765 (H) 292 (H) 239 (H)   Results for ELGAR, RAPPAPORT (MRN [id number]) as of 11/12/2015 09:35  Ref. Range 11/11/2015 05:05  Hemoglobin A1C Latest Ref Range: 4.8 - 5.6 % 9.8 (H)   Review of Glycemic Control  Diabetes history: DM2 Outpatient Diabetes medications: Lantus 61 units bid + Victoza qd + Metformin 500 mg bid Current orders for Inpatient glycemic control: Lantus 65 units bid + Novolog 4 units tid meal coverage + Novolog correction 0-20 units tid  Inpatient Diabetes Program Recommendations:    Noted A1c 9.8. Please consider increase in meal coverage to 6 units tid and add Novolog hs correction of 0-5 units. Will review with patient regarding elevated A1c.  Thank you, [name]. Ryelan Kazee, RN, MSN, CDE Inpatient Glycemic Control Team Team Pager [phone] (8am-5pm) 11/12/2015 9:38 AM

## 2015-11-12 NOTE — Progress Notes (Signed)
ANTIBIOTIC CONSULT NOTE   Pharmacy Consult for Vancomycin Indication: bacteremia  No Known Allergies  Patient Measurements: Height: 6\' 1"  (185.4 cm) Weight: (!) 300 lb 3.2 oz (136.2 kg) IBW/kg (Calculated) : 79.9 Adjusted Body Weight:    Vital Signs: Temp: 100.1 F (37.8 C) (07/27 0445) Temp Source: Oral (07/27 0445) BP: 120/56 (07/27 0953) Pulse Rate: 106 (07/27 0445) Intake/Output from previous day: 07/26 0701 - 07/27 0700 In: 760 [P.O.:760] Out: 2340 [Urine:2340] Intake/Output from this shift: Total I/O In: 240 [P.O.:240] Out: 900 [Urine:900]  Labs:  Recent Labs  11/11/15 0103 11/12/15 0152  WBC 13.8* 15.0*  HGB 15.3 13.6  PLT 194 151  CREATININE 0.72 0.70   Estimated Creatinine Clearance: 156.4 mL/min (by C-G formula based on SCr of 0.8 mg/dL).  Recent Labs  11/12/15 1127  VANCOTROUGH 6*     Microbiology:   Medical History: Past Medical History:  Diagnosis Date  . Hypertension   . IBS (irritable bowel syndrome)   . Pneumonia 2014-2015 X 1  . Type II diabetes mellitus (HCC)     Assessment: Admitted with severe abdominal wall cellulitis for Vancomycin and Zosyn.  Infectious Disease  Vancomycin and Zosyn for severe abdominal wall cellulitis. Now MRSA bacteremia. Renal ok. LA 1.5 down. Vanco trough 6 very low.  Vanco 7/26>> --7/27 VT 6: 2g bolus, then 1750mg  IV 8hr Zosyn 7/26>>7/27  7/26 BC x 2>>MRSA  7/26 Ucx>>   Goal of Therapy:  Vancomycin trough level 15-20 mcg/ml   Plan:  Re-bolus Vanco 2g stat, then increase to 1750mg  IV q8hr Zosyn 3.375g IV q8hr--d/c Need to tighten glucose control! 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

## 2015-11-12 NOTE — Consult Note (Signed)
[facility]       Reason for Consult:  MRSA bacteremia  Referring Physician: CHAMP autoconsult  Principal Problem:   Abdominal wall cellulitis Active Problems:   Hypertension   Diabetes mellitus without complication (HCC)   Sepsis (HCC)   Rash   Candida-induced panniculitis   . insulin aspart  0-20 Units Subcutaneous TID WC  . insulin aspart  4 Units Subcutaneous TID WC  . insulin glargine  65 Units Subcutaneous BID  . losartan  50 mg Oral Daily  . piperacillin-tazobactam (ZOSYN)  IV  3.375 g Intravenous Q8H  . rivaroxaban  20 mg Oral Q supper  . sodium chloride flush  3 mL Intravenous Q12H  . vancomycin  1,250 mg Intravenous Q8H    Recommendations: Will narrow to vancomycin TTE TEE if no endocarditis on TTE Repeat blood cultures   Assessment: He has Staph aureus bacteremia from skin source.    Antibiotics: Vancomycin and zosyn  HPI: Bradley Mcclure is a 55 y.o. male with morbid obesity, who presented to ED with skin infection and wanted an 'ointment'.  He was admitted with fever, chills with an elevated WBC and lactate and concern for sirs.  Hgb A1C 9.8. No associated chest pain, no cough, no N/V.  Does not want to stay, complaining of his phone not charged and can't call anyone to care for his cat.  Has been tired.    Review of Systems:  Constitutional: negative for fevers and chills Gastrointestinal: negative for diarrhea Musculoskeletal: negative for myalgias and arthralgias All other systems reviewed and are negative   Past Medical History:  Diagnosis Date  . Hypertension   . IBS (irritable bowel syndrome)   . Pneumonia 2014-2015 X 1  . Type II diabetes mellitus (HCC)     Social History  Substance Use Topics  . Smoking status: Never Smoker  . Smokeless tobacco: Never Used  . Alcohol use No    Family History  Problem Relation Age of Onset  . Myasthenia gravis Mother   . Diabetes Mellitus I Brother     No Known  Allergies  Physical Exam: Constitutional: in no apparent distress and alert  Vitals:   11/12/15 0445 11/12/15 0953  BP: (!) 126/55 (!) 120/56  Pulse: (!) 106   Resp: 18   Temp: 100.1 F (37.8 C)    EYES: anicteric ENMT: no thrush Cardiovascular: Cor RRR Respiratory: CTA B; normal respiratory effort GI: Bowel sounds are normal, liver is not enlarged, spleen is not enlarged Musculoskeletal: no pedal edema noted Skin: negatives: no rash Hematologic: no cervical lad  Lab Results  Component Value Date   WBC 15.0 (H) 11/12/2015   HGB 13.6 11/12/2015   HCT 40.1 11/12/2015   MCV 93.7 11/12/2015   PLT 151 11/12/2015    Lab Results  Component Value Date   CREATININE 0.70 11/12/2015   BUN 9 11/12/2015   NA 133 (L) 11/12/2015   K 3.9 11/12/2015   CL 100 (L) 11/12/2015   CO2 26 11/12/2015    Lab Results  Component Value Date   ALT 96 (H) 11/11/2015   AST 67 (H) 11/11/2015   ALKPHOS 76 11/11/2015     Microbiology: Recent Results (from the past 240 hour(s))  Blood Culture (routine x 2)     Status: Abnormal (Preliminary result)   Collection Time: 11/11/15  1:04 AM  Result Value Ref Range Status   Specimen Description BLOOD LEFT FOREHAND  Final   Special Requests  BOTTLES DRAWN AEROBIC AND ANAEROBIC 8 CC EACH  Final   Culture  Setup Time   Final    GRAM POSITIVE COCCI IN CLUSTERS AEROBIC BOTTLE ONLY CRITICAL RESULT CALLED TO, READ BACK BY AND VERIFIED WITH: VERONDA BRYK,PHARMD  11/12/15 MKELLY Performed at [facility]    Culture STAPHYLOCOCCUS AUREUS (A)  Final   Report Status PENDING  Incomplete  Blood Culture ID Panel (Reflexed)     Status: Abnormal   Collection Time: 11/11/15  1:04 AM  Result Value Ref Range Status   Enterococcus species NOT DETECTED NOT DETECTED Final   Vancomycin resistance NOT DETECTED NOT DETECTED Final   Listeria monocytogenes NOT DETECTED NOT DETECTED Final   Staphylococcus species DETECTED (A) NOT DETECTED Final    Comment:  CRITICAL RESULT CALLED TO, READ BACK BY AND VERIFIED WITH: V BRYK,PHARMD  11/12/15 MKELLY    Staphylococcus aureus DETECTED (A) NOT DETECTED Final    Comment: CRITICAL RESULT CALLED TO, READ BACK BY AND VERIFIED WITH: V BRYK,PHARMD  11/12/15 MKELLY    Methicillin resistance DETECTED (A) NOT DETECTED Final    Comment: CRITICAL RESULT CALLED TO, READ BACK BY AND VERIFIED WITH: V BRYK,PHARMD  11/12/15 MKELLY    Streptococcus species NOT DETECTED NOT DETECTED Final   Streptococcus agalactiae NOT DETECTED NOT DETECTED Final   Streptococcus pneumoniae NOT DETECTED NOT DETECTED Final   Streptococcus pyogenes NOT DETECTED NOT DETECTED Final   Acinetobacter baumannii NOT DETECTED NOT DETECTED Final   Enterobacteriaceae species NOT DETECTED NOT DETECTED Final   Enterobacter cloacae complex NOT DETECTED NOT DETECTED Final   Escherichia coli NOT DETECTED NOT DETECTED Final   Klebsiella oxytoca NOT DETECTED NOT DETECTED Final   Klebsiella pneumoniae NOT DETECTED NOT DETECTED Final   Proteus species NOT DETECTED NOT DETECTED Final   Serratia marcescens NOT DETECTED NOT DETECTED Final   Carbapenem resistance NOT DETECTED NOT DETECTED Final   Haemophilus influenzae NOT DETECTED NOT DETECTED Final   Neisseria meningitidis NOT DETECTED NOT DETECTED Final   Pseudomonas aeruginosa NOT DETECTED NOT DETECTED Final   Candida albicans NOT DETECTED NOT DETECTED Final   Candida glabrata NOT DETECTED NOT DETECTED Final   Candida krusei NOT DETECTED NOT DETECTED Final   Candida parapsilosis NOT DETECTED NOT DETECTED Final   Candida tropicalis NOT DETECTED NOT DETECTED Final    Comment: Performed at [facility]    Staci Righter, MD [facility] [email or web address] [phone] pager  [phone] cell 11/12/2015, 11:25 AM

## 2015-11-12 NOTE — Progress Notes (Signed)
[facility] PROGRESS NOTE  Bradley Mcclure:[id number] DOB: 11-26-1960 DOA: 11/11/2015 PCP: [facility]  Assessment/Plan: 1-Sepsis: due to panniculitis and MRSA Bacteremia  -has received IVF's resuscitation as part of sepsis protocol -on vancomycin as recommended by ID -repeat blood cx's ordered, along with 2-D echo -will continue supportive care -lactic acid is now WNL -patient still with low grade temp  2-diabetes type 2 with hyperglycemia: -will continue lantus, SSI, and meal coverage -still running high -continue modified carb diet -imaging infection contributing with elevated CBG's -follow A1C  3-essential HTN -continue losartan  4-hx of PE: and per patient some concerns about hypercoagulable state -continue xarelto  5-obesity -low calorie diet and increase exercise discussed with patient  -Body mass index is 39.61 kg/m.    Code Status: Full Family Communication: no family at bedside  Disposition Plan: remains inpatient, continue IV antibiotics; follow results of 2-D echo and repeat blood cultures   Consultants:  ID  Wound care service   Procedures:  2-D echo: pending  Antibiotics:  Vancomycin   HPI/Subjective: Spiking low grade temp, no CP, no SOB. Patient with positive staph (MRSA) on his blood cultures. Denies nausea and vomiting   Objective: Vitals:   11/12/15 0445 11/12/15 0953  BP: (!) 126/55 (!) 120/56  Pulse: (!) 106   Resp: 18   Temp: 100.1 F (37.8 C)     Intake/Output Summary (Last 24 hours) at 11/12/15 1451 Last data filed at 11/12/15 1328  Gross per 24 hour  Intake              240 ml  Output             2740 ml  Net            -2500 ml   Filed Weights   11/11/15 0026 11/11/15 0438  Weight: 135.2 kg (298 lb) (!) 136.2 kg (300 lb 3.2 oz)    Exam:   General:  Feeling somewhat better; no CP or SOB. Patient with low grade temp overnight.  Cardiovascular: tachycardic, no rubs or  gallops  Respiratory: no wheezing, no crackles  Abdomen: soft, obese, with erythematous changes/panniculitis across lower abd; positive BS  Musculoskeletal: trace edema bilaterally, no cyanosis or clubbing   Data Reviewed: Basic Metabolic Panel:  Recent Labs Lab 11/11/15 0103 11/12/15 0152  NA 135 133*  K 4.2 3.9  CL 101 100*  CO2 23 26  GLUCOSE 330* 258*  BUN 15 9  CREATININE 0.72 0.70  CALCIUM 9.9 8.8*   Liver Function Tests:  Recent Labs Lab 11/11/15 0103  AST 67*  ALT 96*  ALKPHOS 76  BILITOT 0.8  PROT 7.8  ALBUMIN 4.2   CBC:  Recent Labs Lab 11/11/15 0103 11/12/15 0152  WBC 13.8* 15.0*  NEUTROABS 11.1*  --   HGB 15.3 13.6  HCT 44.6 40.1  MCV 93.7 93.7  PLT 194 151   CBG:  Recent Labs Lab 11/11/15 1118 11/11/15 1646 11/11/15 2138 11/12/15 0625 11/12/15 1116  GLUCAP 264* 283* 292* 239* 289*    Recent Results (from the past 240 hour(s))  Blood Culture (routine x 2)     Status: Abnormal (Preliminary result)   Collection Time: 11/11/15  1:04 AM  Result Value Ref Range Status   Specimen Description BLOOD LEFT FOREHAND  Final   Special Requests BOTTLES DRAWN AEROBIC AND ANAEROBIC 8 CC EACH  Final   Culture  Setup Time   Final    GRAM POSITIVE COCCI IN CLUSTERS AEROBIC  BOTTLE ONLY CRITICAL RESULT CALLED TO, READ BACK BY AND VERIFIED WITH: VERONDA BRYK,PHARMD @0544  11/12/15 MKELLY Performed at [facility]    Culture STAPHYLOCOCCUS AUREUS (A)  Final   Report Status PENDING  Incomplete  Blood Culture ID Panel (Reflexed)     Status: Abnormal   Collection Time: 11/11/15  1:04 AM  Result Value Ref Range Status   Enterococcus species NOT DETECTED NOT DETECTED Final   Vancomycin resistance NOT DETECTED NOT DETECTED Final   Listeria monocytogenes NOT DETECTED NOT DETECTED Final   Staphylococcus species DETECTED (A) NOT DETECTED Final    Comment: CRITICAL RESULT CALLED TO, READ BACK BY AND VERIFIED WITH: V BRYK,PHARMD @0544  11/12/15  MKELLY    Staphylococcus aureus DETECTED (A) NOT DETECTED Final    Comment: CRITICAL RESULT CALLED TO, READ BACK BY AND VERIFIED WITH: V BRYK,PHARMD @0544  11/12/15 MKELLY    Methicillin resistance DETECTED (A) NOT DETECTED Final    Comment: CRITICAL RESULT CALLED TO, READ BACK BY AND VERIFIED WITH: V BRYK,PHARMD @0544  11/12/15 MKELLY    Streptococcus species NOT DETECTED NOT DETECTED Final   Streptococcus agalactiae NOT DETECTED NOT DETECTED Final   Streptococcus pneumoniae NOT DETECTED NOT DETECTED Final   Streptococcus pyogenes NOT DETECTED NOT DETECTED Final   Acinetobacter baumannii NOT DETECTED NOT DETECTED Final   Enterobacteriaceae species NOT DETECTED NOT DETECTED Final   Enterobacter cloacae complex NOT DETECTED NOT DETECTED Final   Escherichia coli NOT DETECTED NOT DETECTED Final   Klebsiella oxytoca NOT DETECTED NOT DETECTED Final   Klebsiella pneumoniae NOT DETECTED NOT DETECTED Final   Proteus species NOT DETECTED NOT DETECTED Final   Serratia marcescens NOT DETECTED NOT DETECTED Final   Carbapenem resistance NOT DETECTED NOT DETECTED Final   Haemophilus influenzae NOT DETECTED NOT DETECTED Final   Neisseria meningitidis NOT DETECTED NOT DETECTED Final   Pseudomonas aeruginosa NOT DETECTED NOT DETECTED Final   Candida albicans NOT DETECTED NOT DETECTED Final   Candida glabrata NOT DETECTED NOT DETECTED Final   Candida krusei NOT DETECTED NOT DETECTED Final   Candida parapsilosis NOT DETECTED NOT DETECTED Final   Candida tropicalis NOT DETECTED NOT DETECTED Final    Comment: Performed at [facility]  Urine culture     Status: None   Collection Time: 11/11/15  2:00 AM  Result Value Ref Range Status   Specimen Description URINE, RANDOM  Final   Special Requests NONE  Final   Culture NO GROWTH Performed at [facility]   Final   Report Status 11/12/2015 FINAL  Final  Culture, blood (routine x 2)     Status: None (Preliminary result)   Collection Time:  11/12/15 12:37 PM  Result Value Ref Range Status   Specimen Description BLOOD LEFT HAND  Final   Special Requests NONE  Final   Culture PENDING  Incomplete   Report Status PENDING  Incomplete  Culture, blood (routine x 2)     Status: None (Preliminary result)   Collection Time: 11/12/15 12:37 PM  Result Value Ref Range Status   Specimen Description BLOOD RIGHT ANTECUBITAL  Final   Special Requests NONE  Final   Culture PENDING  Incomplete   Report Status PENDING  Incomplete     Studies: Dg Chest 2 View  Result Date: 11/11/2015 CLINICAL DATA:  55 year old male with fever EXAM: CHEST  2 VIEW COMPARISON:  None. FINDINGS: Two views of the chest demonstrate shallow inspiration with mild increased interstitial prominence. There is no focal consolidation, pleural effusion, or  pneumothorax. The cardiac silhouette is within normal limits with no acute osseous pathology identified. IMPRESSION: No active cardiopulmonary disease. Electronically Signed   By: Elgie Collard M.D.   On: 11/11/2015 02:14   Scheduled Meds: . insulin aspart  0-20 Units Subcutaneous TID WC  . insulin aspart  4 Units Subcutaneous TID WC  . insulin glargine  65 Units Subcutaneous BID  . losartan  50 mg Oral Daily  . rivaroxaban  20 mg Oral Q supper  . sodium chloride flush  3 mL Intravenous Q12H  . vancomycin  1,750 mg Intravenous Q8H  . vancomycin  2,000 mg Intravenous STAT   Continuous Infusions: . sodium chloride 125 mL/hr at 11/12/15 1325    Principal Problem:   Abdominal wall cellulitis Active Problems:   Hypertension   Diabetes mellitus without complication (HCC)   Sepsis (HCC)   Rash   Candida-induced panniculitis    Time spent: 30 minutes    Vassie Loll  [facility] Pager [phone]. If 7PM-7AM, please contact night-coverage at [email or web address], password [facility] 11/12/2015, 2:51 PM  LOS: 1 day

## 2015-11-12 NOTE — Progress Notes (Signed)
PHARMACY - PHYSICIAN COMMUNICATION CRITICAL VALUE ALERT - BLOOD CULTURE IDENTIFICATION (BCID)  Results for orders placed or performed during the hospital encounter of 11/11/15  Blood Culture ID Panel (Reflexed) (Collected: 11/11/2015  1:04 AM)  Result Value Ref Range   Enterococcus species NOT DETECTED NOT DETECTED   Vancomycin resistance NOT DETECTED NOT DETECTED   Listeria monocytogenes NOT DETECTED NOT DETECTED   Staphylococcus species DETECTED (A) NOT DETECTED   Staphylococcus aureus DETECTED (A) NOT DETECTED   Methicillin resistance DETECTED (A) NOT DETECTED   Streptococcus species NOT DETECTED NOT DETECTED   Streptococcus agalactiae NOT DETECTED NOT DETECTED   Streptococcus pneumoniae NOT DETECTED NOT DETECTED   Streptococcus pyogenes NOT DETECTED NOT DETECTED   Acinetobacter baumannii NOT DETECTED NOT DETECTED   Enterobacteriaceae species NOT DETECTED NOT DETECTED   Enterobacter cloacae complex NOT DETECTED NOT DETECTED   Escherichia coli NOT DETECTED NOT DETECTED   Klebsiella oxytoca NOT DETECTED NOT DETECTED   Klebsiella pneumoniae NOT DETECTED NOT DETECTED   Proteus species NOT DETECTED NOT DETECTED   Serratia marcescens NOT DETECTED NOT DETECTED   Carbapenem resistance NOT DETECTED NOT DETECTED   Haemophilus influenzae NOT DETECTED NOT DETECTED   Neisseria meningitidis NOT DETECTED NOT DETECTED   Pseudomonas aeruginosa NOT DETECTED NOT DETECTED   Candida albicans NOT DETECTED NOT DETECTED   Candida glabrata NOT DETECTED NOT DETECTED   Candida krusei NOT DETECTED NOT DETECTED   Candida parapsilosis NOT DETECTED NOT DETECTED   Candida tropicalis NOT DETECTED NOT DETECTED    Name of physician (or Provider) Contacted: Maren Reamer, NP via text msg  Changes to prescribed antibiotics required: Already on vanc, no change needed.  Vernard Gambles, PharmD, BCPS  11/12/2015  5:58 AM

## 2015-11-13 ENCOUNTER — Inpatient Hospital Stay (HOSPITAL_COMMUNITY): Payer: Medicare Other

## 2015-11-13 DIAGNOSIS — R7881 Bacteremia: Secondary | ICD-10-CM

## 2015-11-13 LAB — ECHOCARDIOGRAM COMPLETE
CHL CUP DOP CALC LVOT VTI: 16.2 cm
E decel time: 151 msec
E/e' ratio: 7.91
FS: 29 % (ref 28–44)
Height: 73 in
IVS/LV PW RATIO, ED: 1.02
LA diam index: 1.44 cm/m2
LASIZE: 39 mm
LAVOL: 59.7 mL
LAVOLA4C: 64 mL
LAVOLIN: 22.1 mL/m2
LDCA: 3.46 cm2
LEFT ATRIUM END SYS DIAM: 39 mm
LV E/e' medial: 7.91
LV E/e'average: 7.91
LV dias vol: 216 mL — AB (ref 62–150)
LV sys vol index: 41 mL/m2
LVDIAVOLIN: 80 mL/m2
LVELAT: 10.3 cm/s
LVOT SV: 56 mL
LVOT diameter: 21 mm
LVOTPV: 79.1 cm/s
LVSYSVOL: 111 mL — AB (ref 21–61)
MV Dec: 151
MV pk A vel: 58.7 m/s
MV pk E vel: 81.5 m/s
MVPG: 3 mmHg
PW: 10.3 mm — AB (ref 0.6–1.1)
RV TAPSE: 20.3 mm
Simpson's disk: 49
Stroke v: 105 ml
TDI e' lateral: 10.3
TDI e' medial: 8.49
WEIGHTICAEL: 4803.2 [oz_av]

## 2015-11-13 LAB — VANCOMYCIN, TROUGH: Vancomycin Tr: 12 ug/mL — ABNORMAL LOW (ref 15–20)

## 2015-11-13 LAB — GLUCOSE, CAPILLARY
GLUCOSE-CAPILLARY: 205 mg/dL — AB (ref 65–99)
GLUCOSE-CAPILLARY: 172 mg/dL — AB (ref 65–99)
Glucose-Capillary: 256 mg/dL — ABNORMAL HIGH (ref 65–99)
Glucose-Capillary: 196 mg/dL — ABNORMAL HIGH (ref 65–99)

## 2015-11-13 MED ORDER — VANCOMYCIN HCL 10 G IV SOLR
2250.0000 mg | Freq: Three times a day (TID) | INTRAVENOUS | Status: DC
  Administered 2015-11-14 – 2015-11-17 (×11): 2250 mg via INTRAVENOUS
  Filled 2015-11-13 (×13): qty 2250

## 2015-11-13 MED ORDER — FUROSEMIDE 10 MG/ML IJ SOLN
20.0000 mg | Freq: Once | INTRAMUSCULAR | Status: AC
  Administered 2015-11-13: 20 mg via INTRAVENOUS
  Filled 2015-11-13: qty 2

## 2015-11-13 MED ORDER — PERFLUTREN LIPID MICROSPHERE
1.0000 mL | INTRAVENOUS | Status: AC | PRN
  Administered 2015-11-13: 2 mL via INTRAVENOUS
  Filled 2015-11-13: qty 10

## 2015-11-13 MED ORDER — PERFLUTREN LIPID MICROSPHERE
INTRAVENOUS | Status: AC
Start: 2015-11-13 — End: 2015-11-13
  Administered 2015-11-13: 17:00:00
  Filled 2015-11-13: qty 10

## 2015-11-13 NOTE — Progress Notes (Signed)
Pharmacy Antibiotic Note  Bradley Mcclure is a 55 y.o. male admitted on 11/11/2015 with painful rashes.  Pharmacy has been consulted for Vancomycin dosing (Day #) for MRSA bacteremia. TTE showed no vegetations. Will need TEE to r/o endocarditis. ID service following. SCr remains stable  Vancomycin trough 12 mcg/ml (SUBtherapeutic for goal trough of 15-20 mcg) on 1750mg  IV q8h  Plan: Change Vancomycin to 2250mg  IV q8h Will f/u renal function, TEE, micro data, and pt's clinical condition Vanc trough at new Css  Height: 6\' 1"  (185.4 cm) Weight: (!) 300 lb 3.2 oz (136.2 kg) IBW/kg (Calculated) : 79.9  Temp (24hrs), Avg:98.1 F (36.7 C), Min:98 F (36.7 C), Max:98.2 F (36.8 C)   Recent Labs Lab 11/11/15 0103 11/11/15 0107 11/11/15 0511 11/11/15 0827 11/12/15 0152 11/12/15 1127 11/13/15 2128  WBC 13.8*  --   --   --  15.0*  --   --   CREATININE 0.72  --   --   --  0.70  --   --   LATICACIDVEN  --  2.33* 2.1* 2.1* 1.5  --   --   VANCOTROUGH  --   --   --   --   --  6* 12*    Estimated Creatinine Clearance: 156.4 mL/min (by C-G formula based on SCr of 0.8 mg/dL).    No Known Allergies  Antimicrobials this admission: Vanco 7/26>> Zosyn 7/26>>7/27  Dose adjustments this admission: 7/28 VT 12 mcg/ml on Vanc 1750mg  IV q8h - changed to 2.25gm q8h  Microbiology results: 7/26 BC x 2>> 1/2 MRSA 7/26 Ucx>>neg 7/27 BC x 2>>  Thank you for allowing pharmacy to be a part of this patient's care.  Christoper Fabian, PharmD, BCPS Clinical pharmacist, pager [phone] 11/13/2015 11:45 PM

## 2015-11-13 NOTE — Progress Notes (Signed)
  Echocardiogram 2D Echocardiogram has been performed with definity.  Bradley Mcclure 11/13/2015, 5:04 PM

## 2015-11-13 NOTE — Care Management Note (Addendum)
Case Management Note [name] RN, BSN Unit 2W-Case Manager [phone]  Patient Details  Name: Bradley Mcclure MRN: [id number] Date of Birth: [birth year]  Subjective/Objective:    Pt admitted with abdominal wall cellulitis- and Sepsis: due to panniculitis and MRSA Bacteremia                 Action/Plan: PTA pt lived at home-followed at the [facility] clinic-  ID has been consulted- may need long term IV abx- following cultures- CM to follow for potential HH needs at discharge.   Expected Discharge Date:                  Expected Discharge Plan:  Home w Home Health Services  In-House Referral:     Discharge planning Services  CM Consult  Post Acute Care Choice:    Choice offered to:     DME Arranged:    DME Agency:     HH Arranged:    HH Agency:     Status of Service:  In process, will continue to follow  If discussed at Long Length of Stay Meetings, dates discussed:    Additional Comments:  Darrold Span, RN 11/13/2015, 10:53 AM

## 2015-11-13 NOTE — Progress Notes (Signed)
[facility] PROGRESS NOTE  Bradley Mcclure YNW:[id number] DOB: [birth year] DOA: 11/11/2015 PCP: [facility]  Assessment/Plan: 1-Sepsis: due to panniculitis and MRSA Bacteremia  -has received IVF's resuscitation as part of sepsis protocol -on vancomycin as recommended by ID -repeat blood cx's ordered, along with 2-D echo -will continue supportive care -lactic acid is now WNL -patient sepsis features resolving   2-diabetes type 2 with hyperglycemia: -will continue lantus, SSI, and meal coverage -still running high -continue modified carb diet -imaging infection contributing with elevated CBG's -follow A1C  3-essential HTN -continue losartan BP is stable  4-hx of PE: and per patient some concerns about hypercoagulable state -continue xarelto  5-obesity -low calorie diet and increase exercise discussed with patient  -Body mass index is 39.61 kg/m.  6-pedal edema: -most likely due to aggressive IVF's -will use low dose lasix -IVF's rate decreased -TED hoses ordered   Code Status: Full Family Communication: no family at bedside  Disposition Plan: remains inpatient, continue IV antibiotics; follow results of 2-D echo and repeat blood cultures   Consultants:  ID  Wound care service   Procedures:  2-D echo: pending  Antibiotics:  Vancomycin   HPI/Subjective: No CP, no SOB. Patient with positive staph (MRSA) on his blood cultures. Denies nausea and vomiting   Objective: Vitals:   11/12/15 2209 11/13/15 0605  BP: 134/63 140/67  Pulse: (!) 101 91  Resp: 20 18  Temp: 98.2 F (36.8 C) 98.1 F (36.7 C)    Intake/Output Summary (Last 24 hours) at 11/13/15 1335 Last data filed at 11/13/15 1219  Gross per 24 hour  Intake             2560 ml  Output             3525 ml  Net             -965 ml   Filed Weights   11/11/15 0026 11/11/15 0438  Weight: 135.2 kg (298 lb) (!) 136.2 kg (300 lb 3.2 oz)    Exam:   General:  Feeling somewhat  better; no CP or SOB. Patient without fever today. No nausea, no vomiting. Complaining of LE edema.   Cardiovascular: tachycardic, no rubs or gallops  Respiratory: no wheezing, no crackles, good air movement   Abdomen: soft, obese, with erythematous changes/panniculitis across lower abd; positive BS  Musculoskeletal: 1+ pedal edema bilaterally, no cyanosis or clubbing   Data Reviewed: Basic Metabolic Panel:  Recent Labs Lab 11/11/15 0103 11/12/15 0152  NA 135 133*  K 4.2 3.9  CL 101 100*  CO2 23 26  GLUCOSE 330* 258*  BUN 15 9  CREATININE 0.72 0.70  CALCIUM 9.9 8.8*   Liver Function Tests:  Recent Labs Lab 11/11/15 0103  AST 67*  ALT 96*  ALKPHOS 76  BILITOT 0.8  PROT 7.8  ALBUMIN 4.2   CBC:  Recent Labs Lab 11/11/15 0103 11/12/15 0152  WBC 13.8* 15.0*  NEUTROABS 11.1*  --   HGB 15.3 13.6  HCT 44.6 40.1  MCV 93.7 93.7  PLT 194 151   CBG:  Recent Labs Lab 11/12/15 1116 11/12/15 1620 11/12/15 2209 11/13/15 0613 11/13/15 1133  GLUCAP 289* 273* 235* 205* 256*    Recent Results (from the past 240 hour(s))  Blood Culture (routine x 2)     Status: None (Preliminary result)   Collection Time: 11/11/15  1:04 AM  Result Value Ref Range Status   Specimen Description BLOOD RIGHT HAND  Final  Special Requests BOTTLES DRAWN AEROBIC AND ANAEROBIC 5CC EACH  Final   Culture   Final    NO GROWTH 1 DAY Performed at [facility]    Report Status PENDING  Incomplete  Blood Culture (routine x 2)     Status: Abnormal (Preliminary result)   Collection Time: 11/11/15  1:04 AM  Result Value Ref Range Status   Specimen Description BLOOD LEFT FOREHAND  Final   Special Requests BOTTLES DRAWN AEROBIC AND ANAEROBIC 8 CC EACH  Final   Culture  Setup Time   Final    GRAM POSITIVE COCCI IN CLUSTERS AEROBIC BOTTLE ONLY CRITICAL RESULT CALLED TO, READ BACK BY AND VERIFIED WITH: VERONDA BRYK,PHARMD @0544  11/12/15 MKELLY Performed at [facility]     Culture STAPHYLOCOCCUS AUREUS (A)  Final   Report Status PENDING  Incomplete  Blood Culture ID Panel (Reflexed)     Status: Abnormal   Collection Time: 11/11/15  1:04 AM  Result Value Ref Range Status   Enterococcus species NOT DETECTED NOT DETECTED Final   Vancomycin resistance NOT DETECTED NOT DETECTED Final   Listeria monocytogenes NOT DETECTED NOT DETECTED Final   Staphylococcus species DETECTED (A) NOT DETECTED Final    Comment: CRITICAL RESULT CALLED TO, READ BACK BY AND VERIFIED WITH: V BRYK,PHARMD @0544  11/12/15 MKELLY    Staphylococcus aureus DETECTED (A) NOT DETECTED Final    Comment: CRITICAL RESULT CALLED TO, READ BACK BY AND VERIFIED WITH: V BRYK,PHARMD @0544  11/12/15 MKELLY    Methicillin resistance DETECTED (A) NOT DETECTED Final    Comment: CRITICAL RESULT CALLED TO, READ BACK BY AND VERIFIED WITH: V BRYK,PHARMD @0544  11/12/15 MKELLY    Streptococcus species NOT DETECTED NOT DETECTED Final   Streptococcus agalactiae NOT DETECTED NOT DETECTED Final   Streptococcus pneumoniae NOT DETECTED NOT DETECTED Final   Streptococcus pyogenes NOT DETECTED NOT DETECTED Final   Acinetobacter baumannii NOT DETECTED NOT DETECTED Final   Enterobacteriaceae species NOT DETECTED NOT DETECTED Final   Enterobacter cloacae complex NOT DETECTED NOT DETECTED Final   Escherichia coli NOT DETECTED NOT DETECTED Final   Klebsiella oxytoca NOT DETECTED NOT DETECTED Final   Klebsiella pneumoniae NOT DETECTED NOT DETECTED Final   Proteus species NOT DETECTED NOT DETECTED Final   Serratia marcescens NOT DETECTED NOT DETECTED Final   Carbapenem resistance NOT DETECTED NOT DETECTED Final   Haemophilus influenzae NOT DETECTED NOT DETECTED Final   Neisseria meningitidis NOT DETECTED NOT DETECTED Final   Pseudomonas aeruginosa NOT DETECTED NOT DETECTED Final   Candida albicans NOT DETECTED NOT DETECTED Final   Candida glabrata NOT DETECTED NOT DETECTED Final   Candida krusei NOT DETECTED NOT DETECTED  Final   Candida parapsilosis NOT DETECTED NOT DETECTED Final   Candida tropicalis NOT DETECTED NOT DETECTED Final    Comment: Performed at [facility]  Urine culture     Status: None   Collection Time: 11/11/15  2:00 AM  Result Value Ref Range Status   Specimen Description URINE, RANDOM  Final   Special Requests NONE  Final   Culture NO GROWTH Performed at [facility]   Final   Report Status 11/12/2015 FINAL  Final  Culture, blood (routine x 2)     Status: None (Preliminary result)   Collection Time: 11/12/15 12:37 PM  Result Value Ref Range Status   Specimen Description BLOOD LEFT HAND  Final   Special Requests BOTTLES DRAWN AEROBIC AND ANAEROBIC 5CC  Final   Culture PENDING  Incomplete  Report Status PENDING  Incomplete  Culture, blood (routine x 2)     Status: None (Preliminary result)   Collection Time: 11/12/15 12:37 PM  Result Value Ref Range Status   Specimen Description BLOOD RIGHT ANTECUBITAL  Final   Special Requests BOTTLES DRAWN AEROBIC AND ANAEROBIC 5CC  Final   Culture PENDING  Incomplete   Report Status PENDING  Incomplete     Studies: No results found.  Scheduled Meds: . furosemide  20 mg Intravenous Once  . insulin aspart  0-20 Units Subcutaneous TID WC  . insulin aspart  4 Units Subcutaneous TID WC  . insulin glargine  70 Units Subcutaneous BID  . losartan  50 mg Oral Daily  . rivaroxaban  20 mg Oral Q supper  . sodium chloride flush  3 mL Intravenous Q12H  . vancomycin  1,750 mg Intravenous Q8H   Continuous Infusions: . sodium chloride 75 mL/hr at 11/12/15 1511    Principal Problem:   Abdominal wall cellulitis Active Problems:   Hypertension   Diabetes mellitus without complication (HCC)   Sepsis (HCC)   Rash   Candida-induced panniculitis    Time spent: 30 minutes    Vassie Loll  [facility] Pager [phone]. If 7PM-7AM, please contact night-coverage at [email or web address], password [facility] 11/13/2015, 1:35 PM  LOS: 2  days

## 2015-11-14 DIAGNOSIS — E1165 Type 2 diabetes mellitus with hyperglycemia: Secondary | ICD-10-CM

## 2015-11-14 LAB — GLUCOSE, CAPILLARY
GLUCOSE-CAPILLARY: 159 mg/dL — AB (ref 65–99)
GLUCOSE-CAPILLARY: 226 mg/dL — AB (ref 65–99)
GLUCOSE-CAPILLARY: 224 mg/dL — AB (ref 65–99)
Glucose-Capillary: 222 mg/dL — ABNORMAL HIGH (ref 65–99)

## 2015-11-14 LAB — CBC WITH DIFFERENTIAL/PLATELET
BASOS ABS: 0 10*3/uL (ref 0.0–0.1)
BASOS PCT: 0 %
Eosinophils Absolute: 0.3 10*3/uL (ref 0.0–0.7)
Eosinophils Relative: 3 %
HEMATOCRIT: 39.5 % (ref 39.0–52.0)
HEMOGLOBIN: 13.3 g/dL (ref 13.0–17.0)
LYMPHS PCT: 22 %
Lymphs Abs: 1.9 10*3/uL (ref 0.7–4.0)
MCH: 31.9 pg (ref 26.0–34.0)
MCHC: 33.7 g/dL (ref 30.0–36.0)
MCV: 94.7 fL (ref 78.0–100.0)
MONO ABS: 0.6 10*3/uL (ref 0.1–1.0)
Monocytes Relative: 7 %
NEUTROS ABS: 6.1 10*3/uL (ref 1.7–7.7)
NEUTROS PCT: 68 %
Platelets: 191 10*3/uL (ref 150–400)
RBC: 4.17 MIL/uL — AB (ref 4.22–5.81)
RDW: 12.8 % (ref 11.5–15.5)
WBC: 8.9 10*3/uL (ref 4.0–10.5)

## 2015-11-14 LAB — BASIC METABOLIC PANEL
ANION GAP: 6 (ref 5–15)
BUN: 6 mg/dL (ref 6–20)
CHLORIDE: 105 mmol/L (ref 101–111)
CO2: 25 mmol/L (ref 22–32)
Calcium: 9.3 mg/dL (ref 8.9–10.3)
Creatinine, Ser: 0.68 mg/dL (ref 0.61–1.24)
GFR calc non Af Amer: >60 mL/min (ref >60)
GLUCOSE: 285 mg/dL — AB (ref 65–99)
POTASSIUM: 3.8 mmol/L (ref 3.5–5.1)
Sodium: 136 mmol/L (ref 135–145)

## 2015-11-14 LAB — CULTURE, BLOOD (ROUTINE X 2)

## 2015-11-14 LAB — HEMOGLOBIN A1C
HEMOGLOBIN A1C: 10.2 % — AB (ref 4.8–5.6)
Mean Plasma Glucose: 246 mg/dL

## 2015-11-14 NOTE — Progress Notes (Signed)
[facility] PROGRESS NOTE  AQIL WAHLE RUE:[id number] DOB: 02-01-1961 DOA: 11/11/2015 PCP: [facility]  Interim summary and HPI 55 y.o. male with medical history significant of hypertension, diabetes mellitus, IBS, who presents with painful rashes. Pt states that he has painful rashed under his bilateral pannus areas for 3 days ago. He has used a generic over-the-counter antibiotic ointment that did not seem to help. He subsequently used Neosporin which seem to worsen pain and redness. He states that he developed fever and chills at home.   Found to have Sepsis due to panniculitis/superimposed cellulitis and MRSA bacteremia.  Assessment/Plan: 1-Sepsis: due to panniculitis and MRSA Bacteremia  -Received IVF's resuscitation as part of sepsis protocol; -on vancomycin as recommended by ID -repeat blood cx's ordered (and w/o growth), 2-D echo w/o vegetations. Will follow ID rec's for needs of TEE vs extended abx's therapy -will continue supportive care -lactic acid is now WNL and patient is now afebrile and with normal WBC's. -patient sepsis features essentially resolved   2-diabetes type 2 uncontrolled and with hyperglycemia: -will continue lantus, SSI, and meal coverage -continue modified carb diet -improved, but still running high -imaging infection contributing with elevated CBG's -A1C 10.2  3-essential HTN -continue losartan BP is stable  4-hx of PE: and per patient some concerns about hypercoagulable state -continue xarelto  5-obesity -low calorie diet and increase exercise discussed with patient  -Body mass index is 39.61 kg/m.  6-pedal edema: -most likely due to aggressive IVF's -IVF's rate decreased -TED hoses ordered   Code Status: Full Family Communication: no family at bedside  Disposition Plan: remains inpatient, continue IV antibiotics; follow results of 2-D echo and repeat blood cultures   Consultants:  ID  Wound care service    Procedures:  2-D echo: pending - Procedure narrative: Transthoracic echocardiography. Image   quality was fair. The study was technically difficult, as a   result of poor acoustic windows and body habitus. Intravenous   contrast (Definity) was administered. - Left ventricle: The cavity size was normal. Wall thickness was   increased in a pattern of mild LVH. Systolic function was normal.   The estimated ejection fraction was in the range of 50% to 55%.   Wall motion was normal; there were no regional wall motion   abnormalities. Left ventricular diastolic function parameters   were normal. - Ventricular septum: Septal motion showed abnormal function and   dyssynergy. -no vegetations appreciated  Antibiotics:  Vancomycin   HPI/Subjective: No CP, no SOB. Patient with positive staph (MRSA) on his blood cultures. Denies nausea and vomiting. Overall feeling better  Objective: Vitals:   11/13/15 2053 11/14/15 1540  BP: 136/66 (!) 147/66  Pulse: 96 89  Resp: 20 20  Temp: 98 F (36.7 C) 98.3 F (36.8 C)    Intake/Output Summary (Last 24 hours) at 11/14/15 1636 Last data filed at 11/14/15 1542  Gross per 24 hour  Intake              480 ml  Output             2375 ml  Net            -1895 ml   Filed Weights   11/11/15 0026 11/11/15 0438  Weight: 135.2 kg (298 lb) (!) 136.2 kg (300 lb 3.2 oz)    Exam:   General:  Feeling better; no CP or SOB. Patient has remained afebrile now. No nausea, no vomiting. Endorsing improvement on LE edema.  Cardiovascular: tachycardic, no rubs or gallops  Respiratory: no wheezing, no crackles, good air movement   Abdomen: soft, obese, with erythematous changes/panniculitis across lower abd; positive BS  Musculoskeletal: trace pedal edema bilaterally, no cyanosis or clubbing   Data Reviewed: Basic Metabolic Panel:  Recent Labs Lab 11/11/15 0103 11/12/15 0152 11/14/15 1342  NA 135 133* 136  K 4.2 3.9 3.8  CL 101 100* 105   CO2 GLUCOSE 330* 258* 285*  BUN CREATININE 0.72 0.70 0.68  CALCIUM 9.9 8.8* 9.3   Liver Function Tests:  Recent Labs Lab 11/11/15 0103  AST 67*  ALT 96*  ALKPHOS 76  BILITOT 0.8  PROT 7.8  ALBUMIN 4.2   CBC:  Recent Labs Lab 11/11/15 0103 11/12/15 0152 11/14/15 1342  WBC 13.8* 15.0* 8.9  NEUTROABS 11.1*  --  6.1  HGB 15.3 13.6 13.3  HCT 44.6 40.1 39.5  MCV 93.7 93.7 94.7  PLT 194 151 191   CBG:  Recent Labs Lab 11/13/15 1133 11/13/15 1633 11/13/15 2141 11/14/15 0625 11/14/15 1213  GLUCAP 256* 196* 172* 159* 222*    Recent Results (from the past 240 hour(s))  Blood Culture (routine x 2)     Status: None (Preliminary result)   Collection Time: 11/11/15  1:04 AM  Result Value Ref Range Status   Specimen Description BLOOD RIGHT HAND  Final   Special Requests BOTTLES DRAWN AEROBIC AND ANAEROBIC 5CC EACH  Final   Culture   Final    NO GROWTH 3 DAYS Performed at [facility]    Report Status PENDING  Incomplete  Blood Culture (routine x 2)     Status: Abnormal   Collection Time: 11/11/15  1:04 AM  Result Value Ref Range Status   Specimen Description BLOOD LEFT FOREHAND  Final   Special Requests BOTTLES DRAWN AEROBIC AND ANAEROBIC 8 CC EACH  Final   Culture  Setup Time   Final    GRAM POSITIVE COCCI IN CLUSTERS AEROBIC BOTTLE ONLY CRITICAL RESULT CALLED TO, READ BACK BY AND VERIFIED WITH: VERONDA BRYK,PHARMD  11/12/15 MKELLY Performed at [facility]    Culture METHICILLIN RESISTANT STAPHYLOCOCCUS AUREUS (A)  Final   Report Status 11/14/2015 FINAL  Final   Organism ID, Bacteria METHICILLIN RESISTANT STAPHYLOCOCCUS AUREUS  Final      Susceptibility   Methicillin resistant staphylococcus aureus - MIC*    CIPROFLOXACIN <=0.5 SENSITIVE Sensitive     ERYTHROMYCIN >=8 RESISTANT Resistant     GENTAMICIN <=0.5 SENSITIVE Sensitive     OXACILLIN >=4 RESISTANT Resistant     TETRACYCLINE >=16 RESISTANT Resistant      VANCOMYCIN <=0.5 SENSITIVE Sensitive     TRIMETH/SULFA <=10 SENSITIVE Sensitive     CLINDAMYCIN >=8 RESISTANT Resistant     RIFAMPIN <=0.5 SENSITIVE Sensitive     Inducible Clindamycin NEGATIVE Sensitive     * METHICILLIN RESISTANT STAPHYLOCOCCUS AUREUS  Blood Culture ID Panel (Reflexed)     Status: Abnormal   Collection Time: 11/11/15  1:04 AM  Result Value Ref Range Status   Enterococcus species NOT DETECTED NOT DETECTED Final   Vancomycin resistance NOT DETECTED NOT DETECTED Final   Listeria monocytogenes NOT DETECTED NOT DETECTED Final   Staphylococcus species DETECTED (A) NOT DETECTED Final    Comment: CRITICAL RESULT CALLED TO, READ BACK BY AND VERIFIED WITH: V BRYK,PHARMD  11/12/15 MKELLY    Staphylococcus aureus DETECTED (A) NOT DETECTED Final    Comment: CRITICAL RESULT CALLED  TO, READ BACK BY AND VERIFIED WITH: V BRYK,PHARMD @0544  11/12/15 MKELLY    Methicillin resistance DETECTED (A) NOT DETECTED Final    Comment: CRITICAL RESULT CALLED TO, READ BACK BY AND VERIFIED WITH: V BRYK,PHARMD @0544  11/12/15 MKELLY    Streptococcus species NOT DETECTED NOT DETECTED Final   Streptococcus agalactiae NOT DETECTED NOT DETECTED Final   Streptococcus pneumoniae NOT DETECTED NOT DETECTED Final   Streptococcus pyogenes NOT DETECTED NOT DETECTED Final   Acinetobacter baumannii NOT DETECTED NOT DETECTED Final   Enterobacteriaceae species NOT DETECTED NOT DETECTED Final   Enterobacter cloacae complex NOT DETECTED NOT DETECTED Final   Escherichia coli NOT DETECTED NOT DETECTED Final   Klebsiella oxytoca NOT DETECTED NOT DETECTED Final   Klebsiella pneumoniae NOT DETECTED NOT DETECTED Final   Proteus species NOT DETECTED NOT DETECTED Final   Serratia marcescens NOT DETECTED NOT DETECTED Final   Carbapenem resistance NOT DETECTED NOT DETECTED Final   Haemophilus influenzae NOT DETECTED NOT DETECTED Final   Neisseria meningitidis NOT DETECTED NOT DETECTED Final   Pseudomonas  aeruginosa NOT DETECTED NOT DETECTED Final   Candida albicans NOT DETECTED NOT DETECTED Final   Candida glabrata NOT DETECTED NOT DETECTED Final   Candida krusei NOT DETECTED NOT DETECTED Final   Candida parapsilosis NOT DETECTED NOT DETECTED Final   Candida tropicalis NOT DETECTED NOT DETECTED Final    Comment: Performed at [facility]  Urine culture     Status: None   Collection Time: 11/11/15  2:00 AM  Result Value Ref Range Status   Specimen Description URINE, RANDOM  Final   Special Requests NONE  Final   Culture NO GROWTH Performed at [facility]   Final   Report Status 11/12/2015 FINAL  Final  Culture, blood (routine x 2)     Status: None (Preliminary result)   Collection Time: 11/12/15 12:37 PM  Result Value Ref Range Status   Specimen Description BLOOD LEFT HAND  Final   Special Requests BOTTLES DRAWN AEROBIC AND ANAEROBIC 5CC  Final   Culture NO GROWTH 2 DAYS  Final   Report Status PENDING  Incomplete  Culture, blood (routine x 2)     Status: None (Preliminary result)   Collection Time: 11/12/15 12:37 PM  Result Value Ref Range Status   Specimen Description BLOOD RIGHT ANTECUBITAL  Final   Special Requests BOTTLES DRAWN AEROBIC AND ANAEROBIC 5CC  Final   Culture NO GROWTH 2 DAYS  Final   Report Status PENDING  Incomplete     Studies: No results found.  Scheduled Meds: . insulin aspart  0-20 Units Subcutaneous TID WC  . insulin aspart  4 Units Subcutaneous TID WC  . insulin glargine  70 Units Subcutaneous BID  . losartan  50 mg Oral Daily  . rivaroxaban  20 mg Oral Q supper  . sodium chloride flush  3 mL Intravenous Q12H  . vancomycin  2,250 mg Intravenous Q8H   Continuous Infusions: . sodium chloride 50 mL/hr at 11/13/15 2201    Principal Problem:   Abdominal wall cellulitis Active Problems:   Hypertension   Diabetes mellitus without complication (HCC)   Sepsis (HCC)   Rash   Candida-induced panniculitis    Time spent: 25  minutes    Vassie Loll  [facility] Pager [phone]. If 7PM-7AM, please contact night-coverage at [email or web address], password [facility] 11/14/2015, 4:36 PM  LOS: 3 days

## 2015-11-15 DIAGNOSIS — R7881 Bacteremia: Secondary | ICD-10-CM

## 2015-11-15 DIAGNOSIS — R21 Rash and other nonspecific skin eruption: Secondary | ICD-10-CM

## 2015-11-15 LAB — GLUCOSE, CAPILLARY
GLUCOSE-CAPILLARY: 206 mg/dL — AB (ref 65–99)
Glucose-Capillary: 189 mg/dL — ABNORMAL HIGH (ref 65–99)
Glucose-Capillary: 199 mg/dL — ABNORMAL HIGH (ref 65–99)
Glucose-Capillary: 173 mg/dL — ABNORMAL HIGH (ref 65–99)

## 2015-11-15 LAB — VANCOMYCIN, TROUGH: VANCOMYCIN TR: 17 ug/mL (ref 15–20)

## 2015-11-15 NOTE — Progress Notes (Signed)
[facility] PROGRESS NOTE  Bradley Mcclure ZOX:[id number] DOB: [birth year] DOA: 11/11/2015 PCP: [facility]  Interim summary and HPI 55 y.o. male with medical history significant of hypertension, diabetes mellitus, IBS, who presents with painful rashes. Pt states that he has painful rashed under his bilateral pannus areas for 3 days ago. He has used a generic over-the-counter antibiotic ointment that did not seem to help. He subsequently used Neosporin which seem to worsen pain and redness. He states that he developed fever and chills at home.   Found to have Sepsis due to panniculitis/superimposed cellulitis and MRSA bacteremia.  Assessment/Plan: 1-Sepsis: due to panniculitis and MRSA Bacteremia  -Received IVF's resuscitation as part of sepsis protocol; -on vancomycin as recommended by ID -repeat blood cx's ordered (and w/o growth), 2-D echo w/o vegetations. Will follow ID rec's for needs of TEE vs extended abx's therapy -will continue supportive care -lactic acid is now WNL and patient is now afebrile and with normal WBC's. -patient sepsis features essentially resolved  -will place PICC line tomorrow (7/31)  2-diabetes type 2 uncontrolled and with hyperglycemia: -will continue lantus, SSI, and meal coverage -continue modified carb diet -improved, but still running high -imaging infection contributing with elevated CBG's -A1C 10.2  3-essential HTN -continue losartan BP is stable  4-hx of PE: and per patient some concerns about hypercoagulable state -continue xarelto  5-obesity -low calorie diet and increase exercise discussed with patient  -Body mass index is 39.91 kg/m.  6-pedal edema: -most likely due to aggressive IVF's -IVF's rate decreased -TED hoses ordered   Code Status: Full Family Communication: no family at bedside  Disposition Plan: remains inpatient, continue IV antibiotics; 2-D echo without vegetations and repeat blood cultures w/o  growth.   Consultants:  ID  Wound care service   Procedures:  2-D echo: pending - Procedure narrative: Transthoracic echocardiography. Image   quality was fair. The study was technically difficult, as a   result of poor acoustic windows and body habitus. Intravenous   contrast (Definity) was administered. - Left ventricle: The cavity size was normal. Wall thickness was   increased in a pattern of mild LVH. Systolic function was normal.   The estimated ejection fraction was in the range of 50% to 55%.   Wall motion was normal; there were no regional wall motion   abnormalities. Left ventricular diastolic function parameters   were normal. - Ventricular septum: Septal motion showed abnormal function and   dyssynergy. -no vegetations appreciated  Antibiotics:  Vancomycin   HPI/Subjective: No CP, no SOB. Patient with positive staph (MRSA) on his blood cultures. Denies nausea and vomiting. Overall feeling much better.  Objective: Vitals:   11/15/15 0610 11/15/15 1432  BP: (!) 147/69 (!) 148/69  Pulse: 86 82  Resp: 18 18  Temp: 98.3 F (36.8 C) 98.2 F (36.8 C)    Intake/Output Summary (Last 24 hours) at 11/15/15 1712 Last data filed at 11/15/15 1654  Gross per 24 hour  Intake              581 ml  Output             6150 ml  Net            -5569 ml   Filed Weights   11/11/15 0026 11/11/15 0438 11/15/15 0610  Weight: 135.2 kg (298 lb) (!) 136.2 kg (300 lb 3.2 oz) (!) 137.2 kg (302 lb 7.5 oz)    Exam:   General: Feeling better; no CP or  SOB. Patient has remained afebrile now. No nausea, no vomiting. LE edema essentially resolved. In no acute distress.  Cardiovascular: tachycardic, no rubs or gallops  Respiratory: no wheezing, no crackles, good air movement   Abdomen: soft, obese, with improvement erythematous changes/panniculitis across lower abd; his right side with mild ulceration. Positive BS  Musculoskeletal: trace pedal edema bilaterally, no cyanosis  or clubbing   Data Reviewed: Basic Metabolic Panel:  Recent Labs Lab 11/11/15 0103 11/12/15 0152 11/14/15 1342  NA 135 133* 136  K 4.2 3.9 3.8  CL 101 100* 105  CO2 23 26 25   GLUCOSE 330* 258* 285*  BUN 15 9 6   CREATININE 0.72 0.70 0.68  CALCIUM 9.9 8.8* 9.3   Liver Function Tests:  Recent Labs Lab 11/11/15 0103  AST 67*  ALT 96*  ALKPHOS 76  BILITOT 0.8  PROT 7.8  ALBUMIN 4.2   CBC:  Recent Labs Lab 11/11/15 0103 11/12/15 0152 11/14/15 1342  WBC 13.8* 15.0* 8.9  NEUTROABS 11.1*  --  6.1  HGB 15.3 13.6 13.3  HCT 44.6 40.1 39.5  MCV 93.7 93.7 94.7  PLT 194 151 191   CBG:  Recent Labs Lab 11/14/15 1639 11/14/15 2139 11/15/15 0606 11/15/15 1135 11/15/15 1635  GLUCAP 226* 224* 206* 189* 199*    Recent Results (from the past 240 hour(s))  Blood Culture (routine x 2)     Status: None (Preliminary result)   Collection Time: 11/11/15  1:04 AM  Result Value Ref Range Status   Specimen Description BLOOD RIGHT HAND  Final   Special Requests BOTTLES DRAWN AEROBIC AND ANAEROBIC 5CC EACH  Final   Culture   Final    NO GROWTH 4 DAYS Performed at [facility]    Report Status PENDING  Incomplete  Blood Culture (routine x 2)     Status: Abnormal   Collection Time: 11/11/15  1:04 AM  Result Value Ref Range Status   Specimen Description BLOOD LEFT FOREHAND  Final   Special Requests BOTTLES DRAWN AEROBIC AND ANAEROBIC 8 CC EACH  Final   Culture  Setup Time   Final    GRAM POSITIVE COCCI IN CLUSTERS AEROBIC BOTTLE ONLY CRITICAL RESULT CALLED TO, READ BACK BY AND VERIFIED WITH: VERONDA BRYK,PHARMD @0544  11/12/15 MKELLY Performed at [facility]    Culture METHICILLIN RESISTANT STAPHYLOCOCCUS AUREUS (A)  Final   Report Status 11/14/2015 FINAL  Final   Organism ID, Bacteria METHICILLIN RESISTANT STAPHYLOCOCCUS AUREUS  Final      Susceptibility   Methicillin resistant staphylococcus aureus - MIC*    CIPROFLOXACIN <=0.5 SENSITIVE Sensitive      ERYTHROMYCIN >=8 RESISTANT Resistant     GENTAMICIN <=0.5 SENSITIVE Sensitive     OXACILLIN >=4 RESISTANT Resistant     TETRACYCLINE >=16 RESISTANT Resistant     VANCOMYCIN <=0.5 SENSITIVE Sensitive     TRIMETH/SULFA <=10 SENSITIVE Sensitive     CLINDAMYCIN >=8 RESISTANT Resistant     RIFAMPIN <=0.5 SENSITIVE Sensitive     Inducible Clindamycin NEGATIVE Sensitive     * METHICILLIN RESISTANT STAPHYLOCOCCUS AUREUS  Blood Culture ID Panel (Reflexed)     Status: Abnormal   Collection Time: 11/11/15  1:04 AM  Result Value Ref Range Status   Enterococcus species NOT DETECTED NOT DETECTED Final   Vancomycin resistance NOT DETECTED NOT DETECTED Final   Listeria monocytogenes NOT DETECTED NOT DETECTED Final   Staphylococcus species DETECTED (A) NOT DETECTED Final    Comment: CRITICAL RESULT CALLED TO, READ BACK  BY AND VERIFIED WITH: V BRYK,PHARMD  11/12/15 MKELLY    Staphylococcus aureus DETECTED (A) NOT DETECTED Final    Comment: CRITICAL RESULT CALLED TO, READ BACK BY AND VERIFIED WITH: V BRYK,PHARMD  11/12/15 MKELLY    Methicillin resistance DETECTED (A) NOT DETECTED Final    Comment: CRITICAL RESULT CALLED TO, READ BACK BY AND VERIFIED WITH: V BRYK,PHARMD  11/12/15 MKELLY    Streptococcus species NOT DETECTED NOT DETECTED Final   Streptococcus agalactiae NOT DETECTED NOT DETECTED Final   Streptococcus pneumoniae NOT DETECTED NOT DETECTED Final   Streptococcus pyogenes NOT DETECTED NOT DETECTED Final   Acinetobacter baumannii NOT DETECTED NOT DETECTED Final   Enterobacteriaceae species NOT DETECTED NOT DETECTED Final   Enterobacter cloacae complex NOT DETECTED NOT DETECTED Final   Escherichia coli NOT DETECTED NOT DETECTED Final   Klebsiella oxytoca NOT DETECTED NOT DETECTED Final   Klebsiella pneumoniae NOT DETECTED NOT DETECTED Final   Proteus species NOT DETECTED NOT DETECTED Final   Serratia marcescens NOT DETECTED NOT DETECTED Final   Carbapenem resistance NOT  DETECTED NOT DETECTED Final   Haemophilus influenzae NOT DETECTED NOT DETECTED Final   Neisseria meningitidis NOT DETECTED NOT DETECTED Final   Pseudomonas aeruginosa NOT DETECTED NOT DETECTED Final   Candida albicans NOT DETECTED NOT DETECTED Final   Candida glabrata NOT DETECTED NOT DETECTED Final   Candida krusei NOT DETECTED NOT DETECTED Final   Candida parapsilosis NOT DETECTED NOT DETECTED Final   Candida tropicalis NOT DETECTED NOT DETECTED Final    Comment: Performed at [facility]  Urine culture     Status: None   Collection Time: 11/11/15  2:00 AM  Result Value Ref Range Status   Specimen Description URINE, RANDOM  Final   Special Requests NONE  Final   Culture NO GROWTH Performed at [facility]   Final   Report Status 11/12/2015 FINAL  Final  Culture, blood (routine x 2)     Status: None (Preliminary result)   Collection Time: 11/12/15 12:37 PM  Result Value Ref Range Status   Specimen Description BLOOD LEFT HAND  Final   Special Requests BOTTLES DRAWN AEROBIC AND ANAEROBIC 5CC  Final   Culture NO GROWTH 3 DAYS  Final   Report Status PENDING  Incomplete  Culture, blood (routine x 2)     Status: None (Preliminary result)   Collection Time: 11/12/15 12:37 PM  Result Value Ref Range Status   Specimen Description BLOOD RIGHT ANTECUBITAL  Final   Special Requests BOTTLES DRAWN AEROBIC AND ANAEROBIC 5CC  Final   Culture NO GROWTH 3 DAYS  Final   Report Status PENDING  Incomplete     Studies: No results found.  Scheduled Meds: . insulin aspart  0-20 Units Subcutaneous TID WC  . insulin aspart  4 Units Subcutaneous TID WC  . insulin glargine  70 Units Subcutaneous BID  . losartan  50 mg Oral Daily  . rivaroxaban  20 mg Oral Q supper  . sodium chloride flush  3 mL Intravenous Q12H  . vancomycin  2,250 mg Intravenous Q8H   Continuous Infusions: . sodium chloride 50 mL/hr at 11/15/15 7253    Principal Problem:   Abdominal wall cellulitis Active  Problems:   Hypertension   Diabetes mellitus without complication (HCC)   Sepsis (HCC)   Rash   Candida-induced panniculitis    Time spent: 25 minutes    Vassie Loll  [facility] Pager [phone]. If 7PM-7AM, please contact night-coverage at [email or web address], password  [email or web address] 11/15/2015, 5:12 PM  LOS: 4 days

## 2015-11-15 NOTE — Progress Notes (Signed)
    [facility]    Date of Admission:  11/11/2015   Total days of antibiotics 4        Day 4 vanco           ID: Bradley Mcclure is a 55 y.o. male  with morbid obesity, who presented to ED with candidal skin infection affecting pannus admitted with fever, chills with an elevated WBC and lactate and concern for sirs.  Hgb A1C 9.8. Found to have MRSA bacteremia Principal Problem:   Abdominal wall cellulitis Active Problems:   Hypertension   Diabetes mellitus without complication (HCC)   Sepsis (HCC)   Rash   Candida-induced panniculitis    Subjective: Afebrile, abdominal wall area improving  Medications:  . insulin aspart  0-20 Units Subcutaneous TID WC  . insulin aspart  4 Units Subcutaneous TID WC  . insulin glargine  70 Units Subcutaneous BID  . losartan  50 mg Oral Daily  . rivaroxaban  20 mg Oral Q supper  . sodium chloride flush  3 mL Intravenous Q12H  . vancomycin  2,250 mg Intravenous Q8H    Objective: Vital signs in last 24 hours: Temp:  [98.2 F (36.8 C)-98.9 F (37.2 C)] 98.2 F (36.8 C) (07/30 1432) Pulse Rate:  [82-93] 82 (07/30 1432) Resp:  [18] 18 (07/30 1432) BP: (123-148)/(52-69) 148/69 (07/30 1432) SpO2:  [95 %-96 %] 96 % (07/30 1432) Weight:  [137.2 kg (302 lb 7.5 oz)] 137.2 kg (302 lb 7.5 oz) (07/30 0610) Physical Exam  Constitutional: He is oriented to person, place, and time. He appears well-developed and well-nourished. No distress.  HENT:  Mouth/Throat: Oropharynx is clear and moist. No oropharyngeal exudate.  Cardiovascular: Normal rate, regular rhythm and normal heart sounds. Exam reveals no gallop and no friction rub.  No murmur heard.  Pulmonary/Chest: Effort normal and breath sounds normal. No respiratory distress. He has no wheezes.  Abdominal: obese contour. Soft. Bowel sounds are normal. He exhibits no distension. There is no tenderness. Erythema improving to pannus fold R side affected more so than L  side Lymphadenopathy:  He has no cervical adenopathy.  Neurological: He is alert and oriented to person, place, and time.  Skin: Skin is warm and dry. No rash noted. No erythema. R side of lower abdominal fold erythema with serous drainage Psychiatric: He has a normal mood and affect. His behavior is normal.     Lab Results  Recent Labs  11/14/15 1342  WBC 8.9  HGB 13.3  HCT 39.5  NA 136  K 3.8  CL 105  CO2 25  BUN 6  CREATININE 0.68    Microbiology: 7/26 blood cx MRSA 7/27 blood cx NGTD Studies/Results: No results found.   Assessment/Plan: mrsa bacteremia associated with panniculitis = continue with vancomycin. TTE had poor visualization. Would recommend to get TEE to determine length of treatment. Since blood cx are clear, can get picc line. Minimum of 2 wk using 7/27 as day 1, possibly 4 wk pending TEE findings  Panniculitis =continue with IV vanco. Trough 15-20.  Candidal skin infection = continue with nystatin powder  Diabetes mellitus IDDM = continue with moderate control. May need to change home regimen  Dr Luciana Axe to follow tomorrow  Drue Second [facility] Cell: [phone] Pager: [phone]  11/15/2015, 4:20 PM

## 2015-11-15 NOTE — Progress Notes (Signed)
Pharmacy Antibiotic Note  Bradley Mcclure is a 55 y.o. male admitted on 11/11/2015 with abdominal wall cellulitis and MRSA bacteremia on day #5 of antibiotic therapy. Pharmacy has been consulted for vancomycin dosing. Today's vanc trough is therapeutic at 17. Patient is afebrile, wbc decreasing, and Scr stable.   Plan: - Continue Vancomycin 2250 mg IV every 8 hours. Goal trough 15-20 mcg/mL. - Follow-up on ID recommendation for possible TEE - Will monitor renal function, C/S, and clinical condition  Height: 6\' 1"  (185.4 cm) Weight: (!) 302 lb 7.5 oz (137.2 kg) IBW/kg (Calculated) : 79.9  Temp (24hrs), Avg:98.5 F (36.9 C), Min:98.2 F (36.8 C), Max:98.9 F (37.2 C)   Recent Labs Lab 11/11/15 0103 11/11/15 0107 11/11/15 0511 11/11/15 0827 11/12/15 0152  11/13/15 2128 11/14/15 1342 11/15/15 1334  WBC 13.8*  --   --   --  15.0*  --   --  8.9  --   CREATININE 0.72  --   --   --  0.70  --   --  0.68  --   LATICACIDVEN  --  2.33* 2.1* 2.1* 1.5  --   --   --   --   VANCOTROUGH  --   --   --   --   --   < > 12*  --  17  < > = values in this interval not displayed.  Estimated Creatinine Clearance: 157.1 mL/min (by C-G formula based on SCr of 0.8 mg/dL).    No Known Allergies  Antimicrobials this admission: Vanc 7/26 >>  Zosyn 7/26 >> 7/27  Dose adjustments this admission: 7/26 start >> vanc 2g bolus, then 1250mg  IV q8h 7/27 VT=6 >> vanc 2g bolus, then 1750mg  IV q8h 7/28 VT=12 >> increase to vanc 2250mg  IV q8h  Microbiology results: 7/26 BCx: MRSA (1 of 2) 7/26 UCx: NG (final)  7/27 BCx: NGTD  Thank you for allowing pharmacy to be a part of this patient's care.  Allie Bossier, PharmD PGY1 Pharmacy Resident [phone] (Pager) 11/15/2015 3:56 PM

## 2015-11-16 DIAGNOSIS — E1165 Type 2 diabetes mellitus with hyperglycemia: Secondary | ICD-10-CM

## 2015-11-16 DIAGNOSIS — B9562 Methicillin resistant Staphylococcus aureus infection as the cause of diseases classified elsewhere: Secondary | ICD-10-CM

## 2015-11-16 DIAGNOSIS — Z794 Long term (current) use of insulin: Secondary | ICD-10-CM

## 2015-11-16 LAB — GLUCOSE, CAPILLARY
GLUCOSE-CAPILLARY: 199 mg/dL — AB (ref 65–99)
Glucose-Capillary: 180 mg/dL — ABNORMAL HIGH (ref 65–99)
Glucose-Capillary: 135 mg/dL — ABNORMAL HIGH (ref 65–99)
Glucose-Capillary: 161 mg/dL — ABNORMAL HIGH (ref 65–99)

## 2015-11-16 LAB — CULTURE, BLOOD (ROUTINE X 2): Culture: NO GROWTH

## 2015-11-16 MED ORDER — INSULIN GLARGINE 100 UNIT/ML ~~LOC~~ SOLN
75.0000 [IU] | Freq: Two times a day (BID) | SUBCUTANEOUS | Status: DC
  Administered 2015-11-16 – 2015-11-17 (×2): 75 [IU] via SUBCUTANEOUS
  Filled 2015-11-16 (×3): qty 0.75

## 2015-11-16 MED ORDER — SODIUM CHLORIDE 0.9% FLUSH
10.0000 mL | INTRAVENOUS | Status: DC | PRN
  Administered 2015-11-17: 10 mL
  Filled 2015-11-16: qty 40

## 2015-11-16 MED ORDER — SODIUM CHLORIDE 0.9 % IV SOLN
INTRAVENOUS | Status: DC
  Administered 2015-11-16 (×2): via INTRAVENOUS

## 2015-11-16 NOTE — Progress Notes (Signed)
[facility] PROGRESS NOTE  Bradley Mcclure ZOX:[id number] DOB: [birth year] DOA: 11/11/2015 PCP: [facility]  Interim summary and HPI 55 y.o. male with medical history significant of hypertension, diabetes mellitus, IBS, who presents with painful rashes. Pt states that he has painful rashed under his bilateral pannus areas for 3 days ago. He has used a generic over-the-counter antibiotic ointment that did not seem to help. He subsequently used Neosporin which seem to worsen pain and redness. He states that he developed fever and chills at home.   Found to have Sepsis due to panniculitis/superimposed cellulitis and MRSA bacteremia.  Assessment/Plan: 1-Sepsis: due to panniculitis and MRSA Bacteremia  -Received IVF's resuscitation as part of sepsis protocol; -on vancomycin as recommended by ID -repeat blood cx's ordered (and w/o growth), 2-D echo w/o vegetations. -will continue supportive care -lactic acid is now WNL and patient is now afebrile and with normal WBC's. -patient sepsis features essentially resolved  -waiting on PICC line placement and per ID rec's will perform TEE  2-diabetes type 2 uncontrolled and with hyperglycemia: -will continue lantus (now adjusted to 75 units BID), SSI, and meal coverage -continue modified carb diet -improved, but still running high -imaging infection contributing with elevated CBG's -A1C 10.2  3-essential HTN -continue losartan BP is stable and well controlled.  4-hx of PE: and per patient some underlying concerns about hypercoagulable state (reason why he is still on anticoagulation) -continue xarelto  5-obesity -low calorie diet and increase exercise discussed with patient  -Body mass index is 39.91 kg/m.  6-pedal edema: -most likely due to aggressive IVF's -IVF's rate decreased -TED hoses ordered   Code Status: Full Family Communication: no family at bedside  Disposition Plan: remains inpatient, continue IV  antibiotics; 2-D echo without vegetations and repeat blood cultures w/o growth. Cardiology consulted for TEE   Consultants:  ID  Cardiology consulted for TEE  Wound care service   Procedures:  2-D echo: pending - Procedure narrative: Transthoracic echocardiography. Image   quality was fair. The study was technically difficult, as a   result of poor acoustic windows and body habitus. Intravenous   contrast (Definity) was administered. - Left ventricle: The cavity size was normal. Wall thickness was   increased in a pattern of mild LVH. Systolic function was normal.   The estimated ejection fraction was in the range of 50% to 55%.   Wall motion was normal; there were no regional wall motion   abnormalities. Left ventricular diastolic function parameters   were normal. - Ventricular septum: Septal motion showed abnormal function and   dyssynergy. -no vegetations appreciated  Antibiotics:  Vancomycin   HPI/Subjective: No CP, no SOB. Patient in no distress. Denies nausea, vomiting and fever.   Objective: Vitals:   11/16/15 0521 11/16/15 1312  BP: (!) 142/64 (!) 175/78  Pulse: 86 88  Resp: 18 18  Temp: 97.8 F (36.6 C) 98 F (36.7 C)    Intake/Output Summary (Last 24 hours) at 11/16/15 1417 Last data filed at 11/16/15 1230  Gross per 24 hour  Intake              600 ml  Output             5501 ml  Net            -4901 ml   Filed Weights   11/11/15 0026 11/11/15 0438 11/15/15 0610  Weight: 135.2 kg (298 lb) (!) 136.2 kg (300 lb 3.2 oz) (!) 137.2 kg (302 lb 7.5  oz)    Exam:   General: Feeling better; no CP or SOB. Patient has remained afebrile now. No nausea, no vomiting. In no acute distress.  Cardiovascular: tachycardic, no rubs or gallops  Respiratory: no wheezing, no crackles, good air movement   Abdomen: soft, obese, with improvement in erythematous changes/panniculitis across lower abd; his right side with mild ulceration. Positive  BS  Musculoskeletal: trace pedal edema bilaterally, no cyanosis or clubbing   Data Reviewed: Basic Metabolic Panel:  Recent Labs Lab 11/11/15 0103 11/12/15 0152 11/14/15 1342  NA 135 133* 136  K 4.2 3.9 3.8  CL 101 100* 105  CO2 23 26 25   GLUCOSE 330* 258* 285*  BUN 15 9 6   CREATININE 0.72 0.70 0.68  CALCIUM 9.9 8.8* 9.3   Liver Function Tests:  Recent Labs Lab 11/11/15 0103  AST 67*  ALT 96*  ALKPHOS 76  BILITOT 0.8  PROT 7.8  ALBUMIN 4.2   CBC:  Recent Labs Lab 11/11/15 0103 11/12/15 0152 11/14/15 1342  WBC 13.8* 15.0* 8.9  NEUTROABS 11.1*  --  6.1  HGB 15.3 13.6 13.3  HCT 44.6 40.1 39.5  MCV 93.7 93.7 94.7  PLT 194 151 191   CBG:  Recent Labs Lab 11/15/15 0606 11/15/15 1135 11/15/15 1635 11/15/15 2145 11/16/15 0757  GLUCAP 206* 189* 199* 173* 199*    Recent Results (from the past 240 hour(s))  Blood Culture (routine x 2)     Status: None (Preliminary result)   Collection Time: 11/11/15  1:04 AM  Result Value Ref Range Status   Specimen Description BLOOD RIGHT HAND  Final   Special Requests BOTTLES DRAWN AEROBIC AND ANAEROBIC 5CC EACH  Final   Culture   Final    NO GROWTH 4 DAYS Performed at [facility]    Report Status PENDING  Incomplete  Blood Culture (routine x 2)     Status: Abnormal   Collection Time: 11/11/15  1:04 AM  Result Value Ref Range Status   Specimen Description BLOOD LEFT FOREHAND  Final   Special Requests BOTTLES DRAWN AEROBIC AND ANAEROBIC 8 CC EACH  Final   Culture  Setup Time   Final    GRAM POSITIVE COCCI IN CLUSTERS AEROBIC BOTTLE ONLY CRITICAL RESULT CALLED TO, READ BACK BY AND VERIFIED WITH: VERONDA BRYK,PHARMD @0544  11/12/15 MKELLY Performed at [facility]    Culture METHICILLIN RESISTANT STAPHYLOCOCCUS AUREUS (A)  Final   Report Status 11/14/2015 FINAL  Final   Organism ID, Bacteria METHICILLIN RESISTANT STAPHYLOCOCCUS AUREUS  Final      Susceptibility   Methicillin resistant  staphylococcus aureus - MIC*    CIPROFLOXACIN <=0.5 SENSITIVE Sensitive     ERYTHROMYCIN >=8 RESISTANT Resistant     GENTAMICIN <=0.5 SENSITIVE Sensitive     OXACILLIN >=4 RESISTANT Resistant     TETRACYCLINE >=16 RESISTANT Resistant     VANCOMYCIN <=0.5 SENSITIVE Sensitive     TRIMETH/SULFA <=10 SENSITIVE Sensitive     CLINDAMYCIN >=8 RESISTANT Resistant     RIFAMPIN <=0.5 SENSITIVE Sensitive     Inducible Clindamycin NEGATIVE Sensitive     * METHICILLIN RESISTANT STAPHYLOCOCCUS AUREUS  Blood Culture ID Panel (Reflexed)     Status: Abnormal   Collection Time: 11/11/15  1:04 AM  Result Value Ref Range Status   Enterococcus species NOT DETECTED NOT DETECTED Final   Vancomycin resistance NOT DETECTED NOT DETECTED Final   Listeria monocytogenes NOT DETECTED NOT DETECTED Final   Staphylococcus species DETECTED (A) NOT DETECTED Final  Comment: CRITICAL RESULT CALLED TO, READ BACK BY AND VERIFIED WITH: V BRYK,PHARMD @0544  11/12/15 MKELLY    Staphylococcus aureus DETECTED (A) NOT DETECTED Final    Comment: CRITICAL RESULT CALLED TO, READ BACK BY AND VERIFIED WITH: V BRYK,PHARMD @0544  11/12/15 MKELLY    Methicillin resistance DETECTED (A) NOT DETECTED Final    Comment: CRITICAL RESULT CALLED TO, READ BACK BY AND VERIFIED WITH: V BRYK,PHARMD @0544  11/12/15 MKELLY    Streptococcus species NOT DETECTED NOT DETECTED Final   Streptococcus agalactiae NOT DETECTED NOT DETECTED Final   Streptococcus pneumoniae NOT DETECTED NOT DETECTED Final   Streptococcus pyogenes NOT DETECTED NOT DETECTED Final   Acinetobacter baumannii NOT DETECTED NOT DETECTED Final   Enterobacteriaceae species NOT DETECTED NOT DETECTED Final   Enterobacter cloacae complex NOT DETECTED NOT DETECTED Final   Escherichia coli NOT DETECTED NOT DETECTED Final   Klebsiella oxytoca NOT DETECTED NOT DETECTED Final   Klebsiella pneumoniae NOT DETECTED NOT DETECTED Final   Proteus species NOT DETECTED NOT DETECTED Final    Serratia marcescens NOT DETECTED NOT DETECTED Final   Carbapenem resistance NOT DETECTED NOT DETECTED Final   Haemophilus influenzae NOT DETECTED NOT DETECTED Final   Neisseria meningitidis NOT DETECTED NOT DETECTED Final   Pseudomonas aeruginosa NOT DETECTED NOT DETECTED Final   Candida albicans NOT DETECTED NOT DETECTED Final   Candida glabrata NOT DETECTED NOT DETECTED Final   Candida krusei NOT DETECTED NOT DETECTED Final   Candida parapsilosis NOT DETECTED NOT DETECTED Final   Candida tropicalis NOT DETECTED NOT DETECTED Final    Comment: Performed at [facility]  Urine culture     Status: None   Collection Time: 11/11/15  2:00 AM  Result Value Ref Range Status   Specimen Description URINE, RANDOM  Final   Special Requests NONE  Final   Culture NO GROWTH Performed at [facility]   Final   Report Status 11/12/2015 FINAL  Final  Culture, blood (routine x 2)     Status: None (Preliminary result)   Collection Time: 11/12/15 12:37 PM  Result Value Ref Range Status   Specimen Description BLOOD LEFT HAND  Final   Special Requests BOTTLES DRAWN AEROBIC AND ANAEROBIC 5CC  Final   Culture NO GROWTH 3 DAYS  Final   Report Status PENDING  Incomplete  Culture, blood (routine x 2)     Status: None (Preliminary result)   Collection Time: 11/12/15 12:37 PM  Result Value Ref Range Status   Specimen Description BLOOD RIGHT ANTECUBITAL  Final   Special Requests BOTTLES DRAWN AEROBIC AND ANAEROBIC 5CC  Final   Culture NO GROWTH 3 DAYS  Final   Report Status PENDING  Incomplete     Studies: No results found.  Scheduled Meds: . insulin aspart  0-20 Units Subcutaneous TID WC  . insulin aspart  4 Units Subcutaneous TID WC  . insulin glargine  70 Units Subcutaneous BID  . losartan  50 mg Oral Daily  . rivaroxaban  20 mg Oral Q supper  . sodium chloride flush  3 mL Intravenous Q12H  . vancomycin  2,250 mg Intravenous Q8H   Continuous Infusions: . sodium chloride 50 mL/hr  at 11/15/15 1610    Principal Problem:   Cellulitis of abdominal wall Active Problems:   Hypertension   Diabetes mellitus without complication (HCC)   Sepsis (HCC)   Rash   Candida-induced panniculitis   Bacteremia   Morbid obesity due to excess calories (HCC)    Time spent:  25 minutes    Vassie Loll  [facility] Pager [phone]. If 7PM-7AM, please contact night-coverage at [email or web address], password [facility] 11/16/2015, 2:17 PM  LOS: 5 days

## 2015-11-16 NOTE — Progress Notes (Signed)
Patient is followed by the [facility]; CM talked to soc worker with the [facility] to arrange [facility] IV antibiotics and HHRN; awaiting callback from the [place] for approval and HHC agency. Bradley Mcclure [facility] [phone]

## 2015-11-16 NOTE — Progress Notes (Addendum)
    [facility] has been requested to perform a transesophageal echocardiogram on this patient for bacteremia. After careful review of history and examination, the risks and benefits of transesophageal echocardiogram have been explained including risks of esophageal damage, perforation (1:10,000 risk), bleeding, pharyngeal hematoma as well as other potential complications associated with conscious sedation including aspiration, arrhythmia, respiratory failure and death. Alternatives to treatment were discussed, questions were answered. Patient is willing to proceed. Tentatively scheduled for tomorrow AM (we are shuffling pts around so it will probably be 8am, if not that then 11am). I discussed pt's insulin regimen with Dr. Gwenlyn Perking given NPO after midnight and he does plan to make any changes at this time.  Laurann Montana, PA-C 11/16/2015 3:27 PM

## 2015-11-16 NOTE — Progress Notes (Signed)
Peripherally Inserted Central Catheter/Midline Placement  The IV Nurse has discussed with the patient and/or persons authorized to consent for the patient, the purpose of this procedure and the potential benefits and risks involved with this procedure.  The benefits include less needle sticks, lab draws from the catheter and patient may be discharged home with the catheter.  Risks include, but not limited to, infection, bleeding, blood clot (thrombus formation), and puncture of an artery; nerve damage and irregular heat beat.  Alternatives to this procedure were also discussed.  PICC/Midline Placement Documentation        Lisabeth Devoid 11/16/2015, 11:58 AM

## 2015-11-16 NOTE — Progress Notes (Signed)
Talked to [place] with the [facility]; clinical information faxed as requested ( fax # [phone]) to the [facility] for approval. The [facility] use [facility] agencies  [facility] [phone] [facility] [phone] [facility] [phone]; [phone]  CM talked to patient concerning HHC choices, patient stated "which ever agency will take me." [facility] called, voice mail left. [facility] called - they stated that they will call me back. Kristen Cardinal Worker with the [employer] pager # [phone]  [facility] - [place] Harry Rattron [phone]( she will assist the [facility] with home IV antibiotics at discharge/ contact person for the [facility] agency). Abelino Derrick [facility] [phone]

## 2015-11-16 NOTE — Progress Notes (Signed)
[facility]   Reason for visit: Follow up on bacteremia  Interval History: afebrile, TTE unrevealing.  Picc line ordered  Physical Exam: Constitutional:  Vitals:   11/15/15 2143 11/16/15 0521  BP: (!) 110/95 (!) 142/64  Pulse: 92 86  Resp: 19 18  Temp: 98.3 F (36.8 C) 97.8 F (36.6 C)   patient appears in NAD Respiratory: Normal respiratory effort; CTA B Cardiovascular: RRR GI: soft, nt  Review of Systems: Constitutional: negative for fevers and chills Gastrointestinal: negative for diarrhea Musculoskeletal: negative for myalgias and arthralgias  Lab Results  Component Value Date   WBC 8.9 11/14/2015   HGB 13.3 11/14/2015   HCT 39.5 11/14/2015   MCV 94.7 11/14/2015   PLT 191 11/14/2015    Lab Results  Component Value Date   CREATININE 0.68 11/14/2015   BUN 6 11/14/2015   NA 136 11/14/2015   K 3.8 11/14/2015   CL 105 11/14/2015   CO2 25 11/14/2015    Lab Results  Component Value Date   ALT 96 (H) 11/11/2015   AST 67 (H) 11/11/2015   ALKPHOS 76 11/11/2015     Microbiology: Recent Results (from the past 240 hour(s))  Blood Culture (routine x 2)     Status: None (Preliminary result)   Collection Time: 11/11/15  1:04 AM  Result Value Ref Range Status   Specimen Description BLOOD RIGHT HAND  Final   Special Requests BOTTLES DRAWN AEROBIC AND ANAEROBIC 5CC EACH  Final   Culture   Final    NO GROWTH 4 DAYS Performed at [facility]    Report Status PENDING  Incomplete  Blood Culture (routine x 2)     Status: Abnormal   Collection Time: 11/11/15  1:04 AM  Result Value Ref Range Status   Specimen Description BLOOD LEFT FOREHAND  Final   Special Requests BOTTLES DRAWN AEROBIC AND ANAEROBIC 8 CC EACH  Final   Culture  Setup Time   Final    GRAM POSITIVE COCCI IN CLUSTERS AEROBIC BOTTLE ONLY CRITICAL RESULT CALLED TO, READ BACK BY AND VERIFIED WITH: VERONDA BRYK,PHARMD  11/12/15 MKELLY Performed at [facility]    Culture METHICILLIN RESISTANT STAPHYLOCOCCUS AUREUS (A)  Final   Report Status 11/14/2015 FINAL  Final   Organism ID, Bacteria METHICILLIN RESISTANT STAPHYLOCOCCUS AUREUS  Final      Susceptibility   Methicillin resistant staphylococcus aureus - MIC*    CIPROFLOXACIN <=0.5 SENSITIVE Sensitive     ERYTHROMYCIN >=8 RESISTANT Resistant     GENTAMICIN <=0.5 SENSITIVE Sensitive     OXACILLIN >=4 RESISTANT Resistant     TETRACYCLINE >=16 RESISTANT Resistant     VANCOMYCIN <=0.5 SENSITIVE Sensitive     TRIMETH/SULFA <=10 SENSITIVE Sensitive     CLINDAMYCIN >=8 RESISTANT Resistant     RIFAMPIN <=0.5 SENSITIVE Sensitive     Inducible Clindamycin NEGATIVE Sensitive     * METHICILLIN RESISTANT STAPHYLOCOCCUS AUREUS  Blood Culture ID Panel (Reflexed)     Status: Abnormal   Collection Time: 11/11/15  1:04 AM  Result Value Ref Range Status   Enterococcus species NOT DETECTED NOT DETECTED Final   Vancomycin resistance NOT DETECTED NOT DETECTED Final   Listeria monocytogenes NOT DETECTED NOT DETECTED Final   Staphylococcus species DETECTED (A) NOT DETECTED Final    Comment: CRITICAL RESULT CALLED TO, READ BACK BY AND VERIFIED WITH: V BRYK,PHARMD  11/12/15 MKELLY    Staphylococcus aureus DETECTED (A) NOT DETECTED Final  Comment: CRITICAL RESULT CALLED TO, READ BACK BY AND VERIFIED WITH: V BRYK,PHARMD @0544  11/12/15 MKELLY    Methicillin resistance DETECTED (A) NOT DETECTED Final    Comment: CRITICAL RESULT CALLED TO, READ BACK BY AND VERIFIED WITH: V BRYK,PHARMD @0544  11/12/15 MKELLY    Streptococcus species NOT DETECTED NOT DETECTED Final   Streptococcus agalactiae NOT DETECTED NOT DETECTED Final   Streptococcus pneumoniae NOT DETECTED NOT DETECTED Final   Streptococcus pyogenes NOT DETECTED NOT DETECTED Final   Acinetobacter baumannii NOT DETECTED NOT DETECTED Final   Enterobacteriaceae species NOT DETECTED NOT DETECTED Final   Enterobacter cloacae complex NOT DETECTED  NOT DETECTED Final   Escherichia coli NOT DETECTED NOT DETECTED Final   Klebsiella oxytoca NOT DETECTED NOT DETECTED Final   Klebsiella pneumoniae NOT DETECTED NOT DETECTED Final   Proteus species NOT DETECTED NOT DETECTED Final   Serratia marcescens NOT DETECTED NOT DETECTED Final   Carbapenem resistance NOT DETECTED NOT DETECTED Final   Haemophilus influenzae NOT DETECTED NOT DETECTED Final   Neisseria meningitidis NOT DETECTED NOT DETECTED Final   Pseudomonas aeruginosa NOT DETECTED NOT DETECTED Final   Candida albicans NOT DETECTED NOT DETECTED Final   Candida glabrata NOT DETECTED NOT DETECTED Final   Candida krusei NOT DETECTED NOT DETECTED Final   Candida parapsilosis NOT DETECTED NOT DETECTED Final   Candida tropicalis NOT DETECTED NOT DETECTED Final    Comment: Performed at [facility]  Urine culture     Status: None   Collection Time: 11/11/15  2:00 AM  Result Value Ref Range Status   Specimen Description URINE, RANDOM  Final   Special Requests NONE  Final   Culture NO GROWTH Performed at [facility]   Final   Report Status 11/12/2015 FINAL  Final  Culture, blood (routine x 2)     Status: None (Preliminary result)   Collection Time: 11/12/15 12:37 PM  Result Value Ref Range Status   Specimen Description BLOOD LEFT HAND  Final   Special Requests BOTTLES DRAWN AEROBIC AND ANAEROBIC 5CC  Final   Culture NO GROWTH 3 DAYS  Final   Report Status PENDING  Incomplete  Culture, blood (routine x 2)     Status: None (Preliminary result)   Collection Time: 11/12/15 12:37 PM  Result Value Ref Range Status   Specimen Description BLOOD RIGHT ANTECUBITAL  Final   Special Requests BOTTLES DRAWN AEROBIC AND ANAEROBIC 5CC  Final   Culture NO GROWTH 3 DAYS  Final   Report Status PENDING  Incomplete    Impression/Plan:  1. MRSA bacteremia - blood cultures remain negative.  Picc line ordered and to get TEE.  Duration depends on TEE.  2. Dispo - will need Home Health,  care management aware.

## 2015-11-17 ENCOUNTER — Encounter (HOSPITAL_COMMUNITY)
Admission: EM | Disposition: A | Discharge status: Home Health | Payer: Self-pay | Source: Home / Self Care | Attending: Internal Medicine

## 2015-11-17 ENCOUNTER — Inpatient Hospital Stay (HOSPITAL_COMMUNITY): Payer: Medicare Other

## 2015-11-17 ENCOUNTER — Encounter (HOSPITAL_COMMUNITY): Payer: Self-pay

## 2015-11-17 DIAGNOSIS — R7881 Bacteremia: Secondary | ICD-10-CM

## 2015-11-17 HISTORY — PX: TEE WITHOUT CARDIOVERSION: SHX5443

## 2015-11-17 LAB — BASIC METABOLIC PANEL
Anion gap: 7 (ref 5–15)
CHLORIDE: 104 mmol/L (ref 101–111)
CO2: 26 mmol/L (ref 22–32)
Calcium: 9.3 mg/dL (ref 8.9–10.3)
Creatinine, Ser: 0.73 mg/dL (ref 0.61–1.24)
GFR calc Af Amer: >60 mL/min (ref >60)
GFR calc non Af Amer: >60 mL/min (ref >60)
GLUCOSE: 168 mg/dL — AB (ref 65–99)
POTASSIUM: 3.7 mmol/L (ref 3.5–5.1)
Sodium: 137 mmol/L (ref 135–145)

## 2015-11-17 LAB — CULTURE, BLOOD (ROUTINE X 2)
Culture: NO GROWTH
Culture: NO GROWTH

## 2015-11-17 LAB — VANCOMYCIN, TROUGH: Vancomycin Tr: 17 ug/mL (ref 15–20)

## 2015-11-17 LAB — GLUCOSE, CAPILLARY
GLUCOSE-CAPILLARY: 194 mg/dL — AB (ref 65–99)
GLUCOSE-CAPILLARY: 104 mg/dL — AB (ref 65–99)
Glucose-Capillary: 131 mg/dL — ABNORMAL HIGH (ref 65–99)

## 2015-11-17 SURGERY — ECHOCARDIOGRAM, TRANSESOPHAGEAL
Anesthesia: Moderate Sedation

## 2015-11-17 MED ORDER — FENTANYL CITRATE (PF) 100 MCG/2ML IJ SOLN
INTRAMUSCULAR | Status: DC | PRN
  Administered 2015-11-17: 50 ug via INTRAVENOUS
  Administered 2015-11-17: 25 ug via INTRAVENOUS

## 2015-11-17 MED ORDER — HYDRALAZINE HCL 20 MG/ML IJ SOLN
INTRAMUSCULAR | Status: AC
  Filled 2015-11-17: qty 1

## 2015-11-17 MED ORDER — IBUPROFEN 400 MG PO TABS: 400.0000 mg | ORAL_TABLET | Freq: Four times a day (QID) | ORAL | 0 refills | Status: DC | PRN

## 2015-11-17 MED ORDER — INSULIN GLARGINE 100 UNIT/ML ~~LOC~~ SOLN: 75.0000 [IU] | Freq: Two times a day (BID) | SUBCUTANEOUS | Status: DC

## 2015-11-17 MED ORDER — HEPARIN SOD (PORK) LOCK FLUSH 100 UNIT/ML IV SOLN
250.0000 [IU] | INTRAVENOUS | Status: AC | PRN
  Administered 2015-11-17: 250 [IU]

## 2015-11-17 MED ORDER — FENTANYL CITRATE (PF) 100 MCG/2ML IJ SOLN
INTRAMUSCULAR | Status: AC
  Filled 2015-11-17: qty 2

## 2015-11-17 MED ORDER — "INTERDRY AG TEXTILE 10""X144"" EX SHEE": 2.0000 | MEDICATED_PATCH | CUTANEOUS | 1 refills | Status: DC

## 2015-11-17 MED ORDER — MIDAZOLAM HCL 10 MG/2ML IJ SOLN
INTRAMUSCULAR | Status: DC | PRN
  Administered 2015-11-17: 2 mg via INTRAVENOUS
  Administered 2015-11-17: 1 mg via INTRAVENOUS

## 2015-11-17 MED ORDER — MIDAZOLAM HCL 5 MG/ML IJ SOLN
INTRAMUSCULAR | Status: AC
  Filled 2015-11-17: qty 2

## 2015-11-17 MED ORDER — VANCOMYCIN HCL 10 G IV SOLR: 2250.0000 mg | Freq: Three times a day (TID) | INTRAVENOUS | 0 refills | Status: AC

## 2015-11-17 MED ORDER — HYDRALAZINE HCL 20 MG/ML IJ SOLN
INTRAMUSCULAR | Status: DC | PRN
  Administered 2015-11-17: 10 mg via INTRAVENOUS

## 2015-11-17 MED ORDER — LIDOCAINE VISCOUS 2 % MT SOLN
OROMUCOSAL | Status: AC
  Filled 2015-11-17: qty 15

## 2015-11-17 MED ORDER — LIDOCAINE VISCOUS 2 % MT SOLN
OROMUCOSAL | Status: DC | PRN
  Administered 2015-11-17: 15 mL via OROMUCOSAL

## 2015-11-17 NOTE — Discharge Instructions (Signed)

## 2015-11-17 NOTE — Care Management Note (Signed)
Case Management Note  Patient Details  Name: Bradley Mcclure MRN: [id number] Date of Birth: [birth year]  Subjective/Objective:    Cellulitis of abdominal wall                Action/Plan: Discharge Planning: AVS reviewed:  1030 NCM spoke to pt and he lives at home alone. Spoke to Maxcine Ham RN, [facility] [phone]. States she will do teaching for IV abx with pt. Requested Rx, PICC line, HH orders and dc summary be faxed to office.   Refaxed paperwork to [place], Spoke to [place] SW and they have submitted documentation to [place] to approve IV Vancomycin at home. Faxed dc summary, Rx, HH orders, and PICC line documentation, facesheet to [facility] # [phone].   1545 Spoke to [facility] rep, [place] # [phone] and she has needed documentation. Pt was approved for IV Vancomycin.     Expected Discharge Date:  11/17/2015              Expected Discharge Plan:  Home w Home Health Services  In-House Referral:  NA  Discharge planning Services  CM Consult  Post Acute Care Choice:  Home Health Choice offered to:  Patient  DME Arranged:  N/A DME Agency:  NA  HH Arranged:  RN HH Agency:  Other - See comment  Status of Service:  Completed, signed off  If discussed at Long Length of Stay Meetings, dates discussed:    Additional Comments:  Elliot Cousin, RN 11/17/2015, 3:43 PM

## 2015-11-17 NOTE — Progress Notes (Signed)
Inpatient Diabetes Program Recommendations  AACE/ADA: New Consensus Statement on Inpatient Glycemic Control (2015)  Target Ranges:  Prepandial:   less than 140 mg/dL      Peak postprandial:   less than 180 mg/dL (1-2 hours)      Critically ill patients:  140 - 180 mg/dL   Lab Results  Component Value Date   GLUCAP 194 (H) 11/17/2015   HGBA1C 10.2 (H) 11/12/2015    Review of Glycemic Control  Results for CHIKA, WEB (MRN [id number]) as of 11/17/2015 11:06  Ref. Range 11/16/2015 07:57 11/16/2015 11:49 11/16/2015 16:19 11/16/2015 21:27 11/17/2015 06:07  Glucose-Capillary Latest Ref Range: 65 - 99 mg/dL 631 (H) 497 (H) 026 (H) 161 (H) 194 (H)    Diabetes history: Type 2 Outpatient Diabetes medications: Lantus 75 units bid, Novolog 0-20 units tid, Novolog 4 units tid Current orders for Inpatient glycemic control: Lantus 61 units bid, Metformin 1000mg  bid, Victoza daily  Inpatient Diabetes Program Recommendations: Noted that Lantus was increased from 70 to 75 units bid but fasting remains elevated- will follow.   Consider increasing Novolog to 5 units tid.   Susette Racer, RN, BA, MHA, CDE Diabetes Coordinator Inpatient Diabetes Program  [phone] (Team Pager) [phone] [facility] Office) 11/17/2015 11:12 AM

## 2015-11-17 NOTE — Discharge Summary (Signed)
Physician Discharge Summary  Bradley Mcclure KFM:[id number] DOB: Jan 25, 1961 DOA: 11/11/2015  PCP: [facility]  Admit date: 11/11/2015 Discharge date: 11/17/2015  Time spent: 35 minutes  Recommendations for Outpatient Follow-up:  1. Close follow up to his CBG's and further adjust hypoglycemic regimen as needed 2. Please reassess panniculitis process to ensure complete healing and resolution 3. Check BMEt to follow electrolytes and renal function    Discharge Diagnoses:  Principal Problem:   Cellulitis of abdominal wall Active Problems:   Hypertension   Diabetes mellitus without complication (HCC)   Sepsis (HCC)   Rash   Candida-induced panniculitis   Bacteremia   Morbid obesity due to excess calories (HCC)   Uncontrolled type 2 diabetes mellitus with hyperglycemia, with long-term current use of insulin (HCC)   Discharge Condition: stable and improved. Discharge home with [facility] services for IV antibiotics and instructions to follow up with PCP in 2 weeks.  Diet recommendation: heart healthy and modify carbohydrates  Filed Weights   11/11/15 0438 11/15/15 0610 11/17/15 0444  Weight: (!) 136.2 kg (300 lb 3.2 oz) (!) 137.2 kg (302 lb 7.5 oz) 135.4 kg (298 lb 6.4 oz)    History of present illness:  As per Dr. Clyde Lundborg H&P written on 11/11/15 55 y.o. male with medical history significant of hypertension, diabetes mellitus, IBS, who presents with painful rashes. Pt states that he has painful rashed under his bilateral pannus areas for 3 days ago. He has used a generic over-the-counter antibiotic ointment that did not seem to help. He subsequently used Neosporin which seem to worsen pain and redness. He states that he developed fever and chills. His pain is constant, 5 out of 10 in severity, nonradiating. He states that he has similar rashed breakout almost every year in this season. He does not have chest pain, cough, shortness breath, nausea, vomiting, diarrhea, symptoms of  UTI.  Hospital Course:  1-Sepsis: due to panniculitis and MRSA Bacteremia  -Received IVF's resuscitation as part of sepsis protocol; and at discharge sepsis features resolved -on vancomycin as recommended by ID and planning to treat for 2 weeks (las antibiotic day 11/26/15) -repeat blood cx's ordered (and w/o growth), 2-D echo and TEE w/o vegetations. -will continue interdry AG + for panniculitis  -advise to keep area clean and dry  2-diabetes type 2 uncontrolled and with hyperglycemia: -will continue lantus (now adjusted to 75 units BID) and resume victoza and metformin -patient advise to follow low carb diet -imaging infection contributing with elevated CBG's -But A1C 10.2  (which demonstrate poor control) -further adjustment on hypoglycemic regimen to be done by  His PCP  3-essential HTN -fair control -advise to follow heart healthy diet -continue home antihypertensive regimen   4-hx of PE: and per patient some underlying concerns about hypercoagulable state (reason why he is still on anticoagulation) -continue xarelto  5-obesity -low calorie diet and increase exercise discussed with patient  -Body mass index is 39.91 kg/m.  6-pedal edema: -most likely due to aggressive IVF's given on resuscitation -resolved after using one dose of lasix and TED hoses. -advise to follow heart healthy and low sodium diet   Procedures:  2-D echo: pending - Procedure narrative: Transthoracic echocardiography. Image quality was fair. The study was technically difficult, as a result of poor acoustic windows and body habitus. Intravenous contrast (Definity) was administered. - Left ventricle: The cavity size was normal. Wall thickness was increased in a pattern of mild LVH. Systolic function was normal. The estimated ejection fraction was in  the range of 50% to 55%. Wall motion was normal; there were no regional wall motion abnormalities. Left ventricular diastolic  function parameters were normal. - Ventricular septum: Septal motion showed abnormal function and dyssynergy. -no vegetations appreciated   TEE -no vegetations seen   Consultations:  ID  Cardiology consulted for TEE  Wound care service   Discharge Exam: Vitals:   11/17/15 0849 11/17/15 0900  BP: (!) 210/96 (!) 168/103  Pulse: 88   Resp: 19   Temp: 97.8 F (36.6 C)      General: Feeling better; no CP or SOB. Patient has remained afebrile now. No nausea, no vomiting. In no acute distress.  Cardiovascular: tachycardic, no rubs or gallops  Respiratory: no wheezing, no crackles, good air movement   Abdomen: soft, obese, with continue improvement in erythematous changes/panniculitis across lower abd; his right side with mild ulceration is not longer draining and healing properly. Positive BS  Musculoskeletal: trace pedal edema bilaterally, no cyanosis or clubbing    Discharge Instructions   Discharge Instructions    Diet - low sodium heart healthy    Complete by:  As directed   Discharge instructions    Complete by:  As directed   Follow up with PCP in 2 weeks Follow low carb diet Please keep area clean and dry Continue use of interdry AG+ every 5 days   Increase activity slowly    Complete by:  As directed     Current Discharge Medication List    START taking these medications   Details  ibuprofen (ADVIL,MOTRIN) 400 MG tablet Take 1 tablet (400 mg total) by mouth every 6 (six) hours as needed for moderate pain. Qty: 30 tablet, Refills: 0    Skin Protectants, Misc. (INTERDRY AG TEXTILE 16"X096") SHEE Apply 2 packets topically once a week. Qty: 10 each, Refills: 1    vancomycin 2,250 mg in sodium chloride 0.9 % 500 mL Inject 2,250 mg into the vein every 8 (eight) hours. Qty: 94500 mg, Refills: 0      CONTINUE these medications which have CHANGED   Details  insulin glargine (LANTUS) 100 UNIT/ML injection Inject 0.75 mLs (75 Units total) into the skin  2 (two) times daily.      CONTINUE these medications which have NOT CHANGED   Details  glucose blood test strip 1 each by Other route as needed for other. Use as instructed    Liraglutide (VICTOZA) 18 MG/3ML SOPN Inject into the skin daily.     losartan (COZAAR) 50 MG tablet Take 50 mg by mouth daily.    metFORMIN (GLUCOPHAGE) 500 MG tablet Take by mouth 2 (two) times daily with a meal.    pravastatin (PRAVACHOL) 40 MG tablet Take 40 mg by mouth daily.    rivaroxaban (XARELTO) 20 MG TABS tablet Take 20 mg by mouth daily with supper.       No Known Allergies Follow-up Information    [facility]. Schedule an appointment as soon as possible for a visit in 2 week(s).   Contact information: [address] [facility] [place] [address] [phone]           The results of significant diagnostics from this hospitalization (including imaging, microbiology, ancillary and laboratory) are listed below for reference.    Significant Diagnostic Studies: Dg Chest 2 View  Result Date: 11/11/2015 CLINICAL DATA:  56 year old male with fever EXAM: CHEST  2 VIEW COMPARISON:  None. FINDINGS: Two views of the chest demonstrate shallow inspiration with mild increased  interstitial prominence. There is no focal consolidation, pleural effusion, or pneumothorax. The cardiac silhouette is within normal limits with no acute osseous pathology identified. IMPRESSION: No active cardiopulmonary disease. Electronically Signed   By: Elgie Collard M.D.   On: 11/11/2015 02:14   Microbiology: Recent Results (from the past 240 hour(s))  Blood Culture (routine x 2)     Status: None   Collection Time: 11/11/15  1:04 AM  Result Value Ref Range Status   Specimen Description BLOOD RIGHT HAND  Final   Special Requests BOTTLES DRAWN AEROBIC AND ANAEROBIC 5CC EACH  Final   Culture   Final    NO GROWTH 5 DAYS Performed at [facility]    Report Status 11/16/2015 FINAL  Final   Blood Culture (routine x 2)     Status: Abnormal   Collection Time: 11/11/15  1:04 AM  Result Value Ref Range Status   Specimen Description BLOOD LEFT FOREHAND  Final   Special Requests BOTTLES DRAWN AEROBIC AND ANAEROBIC 8 CC EACH  Final   Culture  Setup Time   Final    GRAM POSITIVE COCCI IN CLUSTERS AEROBIC BOTTLE ONLY CRITICAL RESULT CALLED TO, READ BACK BY AND VERIFIED WITH: VERONDA BRYK,PHARMD @0544  11/12/15 MKELLY Performed at [facility]    Culture METHICILLIN RESISTANT STAPHYLOCOCCUS AUREUS (A)  Final   Report Status 11/14/2015 FINAL  Final   Organism ID, Bacteria METHICILLIN RESISTANT STAPHYLOCOCCUS AUREUS  Final      Susceptibility   Methicillin resistant staphylococcus aureus - MIC*    CIPROFLOXACIN <=0.5 SENSITIVE Sensitive     ERYTHROMYCIN >=8 RESISTANT Resistant     GENTAMICIN <=0.5 SENSITIVE Sensitive     OXACILLIN >=4 RESISTANT Resistant     TETRACYCLINE >=16 RESISTANT Resistant     VANCOMYCIN <=0.5 SENSITIVE Sensitive     TRIMETH/SULFA <=10 SENSITIVE Sensitive     CLINDAMYCIN >=8 RESISTANT Resistant     RIFAMPIN <=0.5 SENSITIVE Sensitive     Inducible Clindamycin NEGATIVE Sensitive     * METHICILLIN RESISTANT STAPHYLOCOCCUS AUREUS  Blood Culture ID Panel (Reflexed)     Status: Abnormal   Collection Time: 11/11/15  1:04 AM  Result Value Ref Range Status   Enterococcus species NOT DETECTED NOT DETECTED Final   Vancomycin resistance NOT DETECTED NOT DETECTED Final   Listeria monocytogenes NOT DETECTED NOT DETECTED Final   Staphylococcus species DETECTED (A) NOT DETECTED Final    Comment: CRITICAL RESULT CALLED TO, READ BACK BY AND VERIFIED WITH: V BRYK,PHARMD @0544  11/12/15 MKELLY    Staphylococcus aureus DETECTED (A) NOT DETECTED Final    Comment: CRITICAL RESULT CALLED TO, READ BACK BY AND VERIFIED WITH: V BRYK,PHARMD @0544  11/12/15 MKELLY    Methicillin resistance DETECTED (A) NOT DETECTED Final    Comment: CRITICAL RESULT CALLED TO, READ BACK BY  AND VERIFIED WITH: V BRYK,PHARMD @0544  11/12/15 MKELLY    Streptococcus species NOT DETECTED NOT DETECTED Final   Streptococcus agalactiae NOT DETECTED NOT DETECTED Final   Streptococcus pneumoniae NOT DETECTED NOT DETECTED Final   Streptococcus pyogenes NOT DETECTED NOT DETECTED Final   Acinetobacter baumannii NOT DETECTED NOT DETECTED Final   Enterobacteriaceae species NOT DETECTED NOT DETECTED Final   Enterobacter cloacae complex NOT DETECTED NOT DETECTED Final   Escherichia coli NOT DETECTED NOT DETECTED Final   Klebsiella oxytoca NOT DETECTED NOT DETECTED Final   Klebsiella pneumoniae NOT DETECTED NOT DETECTED Final   Proteus species NOT DETECTED NOT DETECTED Final   Serratia marcescens NOT DETECTED NOT DETECTED Final  Carbapenem resistance NOT DETECTED NOT DETECTED Final   Haemophilus influenzae NOT DETECTED NOT DETECTED Final   Neisseria meningitidis NOT DETECTED NOT DETECTED Final   Pseudomonas aeruginosa NOT DETECTED NOT DETECTED Final   Candida albicans NOT DETECTED NOT DETECTED Final   Candida glabrata NOT DETECTED NOT DETECTED Final   Candida krusei NOT DETECTED NOT DETECTED Final   Candida parapsilosis NOT DETECTED NOT DETECTED Final   Candida tropicalis NOT DETECTED NOT DETECTED Final    Comment: Performed at [facility]  Urine culture     Status: None   Collection Time: 11/11/15  2:00 AM  Result Value Ref Range Status   Specimen Description URINE, RANDOM  Final   Special Requests NONE  Final   Culture NO GROWTH Performed at [facility]   Final   Report Status 11/12/2015 FINAL  Final  Culture, blood (routine x 2)     Status: None (Preliminary result)   Collection Time: 11/12/15 12:37 PM  Result Value Ref Range Status   Specimen Description BLOOD LEFT HAND  Final   Special Requests BOTTLES DRAWN AEROBIC AND ANAEROBIC 5CC  Final   Culture NO GROWTH 4 DAYS  Final   Report Status PENDING  Incomplete  Culture, blood (routine x 2)     Status: None  (Preliminary result)   Collection Time: 11/12/15 12:37 PM  Result Value Ref Range Status   Specimen Description BLOOD RIGHT ANTECUBITAL  Final   Special Requests BOTTLES DRAWN AEROBIC AND ANAEROBIC 5CC  Final   Culture NO GROWTH 4 DAYS  Final   Report Status PENDING  Incomplete     Labs: Basic Metabolic Panel:  Recent Labs Lab 11/11/15 0103 11/12/15 0152 11/14/15 1342 11/17/15 1215  NA 135 133* 136 137  K 4.2 3.9 3.8 3.7  CL 101 100* 105 104  CO2 GLUCOSE 330* 258* 285* 168*  BUN <5*  CREATININE 0.72 0.70 0.68 0.73  CALCIUM 9.9 8.8* 9.3 9.3   Liver Function Tests:  Recent Labs Lab 11/11/15 0103  AST 67*  ALT 96*  ALKPHOS 76  BILITOT 0.8  PROT 7.8  ALBUMIN 4.2   CBC:  Recent Labs Lab 11/11/15 0103 11/12/15 0152 11/14/15 1342  WBC 13.8* 15.0* 8.9  NEUTROABS 11.1*  --  6.1  HGB 15.3 13.6 13.3  HCT 44.6 40.1 39.5  MCV 93.7 93.7 94.7  PLT 194 151 191    CBG:  Recent Labs Lab 11/16/15 1149 11/16/15 1619 11/16/15 2127 11/17/15 0607 11/17/15 1129  GLUCAP 180* 135* 161* 194* 104*    Signed:  Vassie Loll MD.  [facility] 11/17/2015, 1:35 PM

## 2015-11-17 NOTE — H&P (Signed)
Patient ID: Bradley Mcclure MRN: [id number] DOB/AGE: 21-Sep-1960 55 y.o.  Admit date: 11/11/2015 Primary Physician   [facility]  Primary Cardiologist   n/a Chief Complaint   MRSA bacteremia   HPI: Bradley Mcclure is a 73M with hypertension, diabetes and IBS here with MRS sepsis and panniculitis/cellulitis.  No vegetation on TTE.  Referred for TEE.     Past Medical History:  Diagnosis Date  . Hypertension   . IBS (irritable bowel syndrome)   . Pneumonia 2014-2015 X 1  . Type II diabetes mellitus (HCC)     Medications Prior to Admission  Medication Sig Dispense Refill  . glucose blood test strip 1 each by Other route as needed for other. Use as instructed    . insulin glargine (LANTUS) 100 UNIT/ML injection Inject 61 Units into the skin 2 (two) times daily.     . Liraglutide (VICTOZA) 18 MG/3ML SOPN Inject into the skin daily.     Marland Kitchen losartan (COZAAR) 50 MG tablet Take 50 mg by mouth daily.    . metFORMIN (GLUCOPHAGE) 500 MG tablet Take by mouth 2 (two) times daily with a meal.    . pravastatin (PRAVACHOL) 40 MG tablet Take 40 mg by mouth daily.    . rivaroxaban (XARELTO) 20 MG TABS tablet Take 20 mg by mouth daily with supper.       No Known Allergies  Social History   Social History  . Marital status: Single    Spouse name: N/A  . Number of children: N/A  . Years of education: N/A   Occupational History  . Not on file.   Social History Main Topics  . Smoking status: Never Smoker  . Smokeless tobacco: Never Used  . Alcohol use No  . Drug use: No  . Sexual activity: Yes   Other Topics Concern  . Not on file   Social History Narrative  . No narrative on file    Family History  Problem Relation Age of Onset  . Myasthenia gravis Mother   . Diabetes Mellitus I Brother     PHYSICAL EXAM: Vitals:   11/17/15 0444 11/17/15 0725  BP: (!) 113/44 (!) 194/82  Pulse: 88 81  Resp: 18 20  Temp: 97.4 F (36.3 C)    General:  Well appearing. No  respiratory difficulty  HEENT: normal Neck: supple. no JVD. Carotids 2+ bilat; no bruits. No lymphadenopathy or thryomegaly appreciated. Cor: PMI nondisplaced. Regular rate & rhythm. No rubs, gallops or murmurs. Lungs: clear Abdomen: soft, nontender, nondistended. No hepatosplenomegaly. No bruits or masses. Good bowel sounds. Extremities: no cyanosis, clubbing, rash, edema Neuro: alert & oriented x 3, cranial nerves grossly intact. moves all 4 extremities w/o difficulty. Affect pleasant.   Results for orders placed or performed during the hospital encounter of 11/11/15 (from the past 24 hour(s))  Glucose, capillary     Status: Abnormal   Collection Time: 11/16/15  7:57 AM  Result Value Ref Range   Glucose-Capillary 199 (H) 65 - 99 mg/dL  Glucose, capillary     Status: Abnormal   Collection Time: 11/16/15 11:49 AM  Result Value Ref Range   Glucose-Capillary 180 (H) 65 - 99 mg/dL   Comment 1 Notify RN   Glucose, capillary     Status: Abnormal   Collection Time: 11/16/15  4:19 PM  Result Value Ref Range   Glucose-Capillary 135 (H) 65 - 99 mg/dL  Glucose, capillary     Status: Abnormal   Collection Time:  11/16/15  9:27 PM  Result Value Ref Range   Glucose-Capillary 161 (H) 65 - 99 mg/dL  Glucose, capillary     Status: Abnormal   Collection Time: 11/17/15  6:07 AM  Result Value Ref Range   Glucose-Capillary 194 (H) 65 - 99 mg/dL   Comment 1 Notify RN    No results found.    ASSESSMENT/PLAN:  [name] is a 50M with hypertension, diabetes and IBS here with MRS sepsis and panniculitis/cellulitis.  No vegetation on TTE.  Referred for TEE.  Bradley Mcclure is a 55 y.o. male who has presented today for surgery, with the diagnosis of atrial fibrillation. The various methods of treatment have been discussed with the patient and family. After consideration of risks, benefits and other options for treatment, the patient has consented to Procedure(s): TRANSESOPHAGEAL ECHOCARDIOGRAM  (TEE) (N/A) as a surgical intervention . The patient's history has been reviewed, patient examined, no change in status, stable for surgery. I have reviewed the patient's chart and labs. Questions were answered to the patient's satisfaction.   Carver Murakami C. Duke Salvia, MD, [employer]  11/17/2015 @     Signed: Micki Riley. Duke Salvia, MD, [facility]  11/17/2015, 7:38 AM

## 2015-11-17 NOTE — Interval H&P Note (Signed)
History and Physical Interval Note:  11/17/2015 8:02 AM  Bradley Mcclure  has presented today for surgery, with the diagnosis of BACTEREMIA  The various methods of treatment have been discussed with the patient and family. After consideration of risks, benefits and other options for treatment, the patient has consented to  Procedure(s): TRANSESOPHAGEAL ECHOCARDIOGRAM (TEE) (N/A) as a surgical intervention .  The patient's history has been reviewed, patient examined, no change in status, stable for surgery.  I have reviewed the patient's chart and labs.  Questions were answered to the patient's satisfaction.     Chilton Si, MD

## 2015-11-17 NOTE — Progress Notes (Signed)
Arrived to patients unit. Nurse stated "don't draw Vanco level, pt will refuse, he has been yelling, bacause he wants to go home." pt was found standing at the door with bag in hand. Pt left unit as soon as line was flushed. Vast nurse reviewed PICC line safety and what to report. VU. Tomasita Morrow, RN VAST

## 2015-11-17 NOTE — Progress Notes (Signed)
[facility] called, they can accept the patient and will be in today to see him. Abelino Derrick [facility] [phone]

## 2015-11-17 NOTE — Progress Notes (Signed)
Faxed Vanc Trough and PICC Line Placement via chest xray to [facility] # [phone].Isidoro Donning RN CCM Case Mgmt phone [phone]

## 2015-11-17 NOTE — CV Procedure (Signed)
Brief TEE Note  LVEF 45-50% No LA/LAA thrombus or mass No evidence of thrombus or vegetation Trivial MR  For additional details see full report.  During this procedure the patient is administered a total of Versed 3 mg and Fentanyl 75 mcg to achieve and maintain moderate conscious sedation.  The patient's heart rate, blood pressure, and oxygen saturation are monitored continuously during the procedure. The period of conscious sedation is 18 minutes, of which I was present face-to-face 100% of this time.   Malaak Stach C. Duke Salvia, MD, [facility]  11/17/2015  8:35 AM

## 2015-11-18 ENCOUNTER — Encounter (HOSPITAL_COMMUNITY): Payer: Self-pay | Admitting: Cardiovascular Disease

## 2015-12-07 ENCOUNTER — Emergency Department (HOSPITAL_BASED_OUTPATIENT_CLINIC_OR_DEPARTMENT_OTHER)
Admission: EM | Admit: 2015-12-07 | Discharge: 2015-12-07 | Disposition: A | Discharge status: Home / Self Care | Payer: Non-veteran care | Attending: Emergency Medicine | Admitting: Emergency Medicine

## 2015-12-07 ENCOUNTER — Encounter (HOSPITAL_BASED_OUTPATIENT_CLINIC_OR_DEPARTMENT_OTHER): Payer: Self-pay | Admitting: *Deleted

## 2015-12-07 DIAGNOSIS — E119 Type 2 diabetes mellitus without complications: Secondary | ICD-10-CM | POA: Insufficient documentation

## 2015-12-07 DIAGNOSIS — Z794 Long term (current) use of insulin: Secondary | ICD-10-CM | POA: Diagnosis not present

## 2015-12-07 DIAGNOSIS — L02413 Cutaneous abscess of right upper limb: Secondary | ICD-10-CM | POA: Insufficient documentation

## 2015-12-07 DIAGNOSIS — L0390 Cellulitis, unspecified: Secondary | ICD-10-CM

## 2015-12-07 DIAGNOSIS — I10 Essential (primary) hypertension: Secondary | ICD-10-CM | POA: Insufficient documentation

## 2015-12-07 DIAGNOSIS — L0291 Cutaneous abscess, unspecified: Secondary | ICD-10-CM

## 2015-12-07 DIAGNOSIS — Z79899 Other long term (current) drug therapy: Secondary | ICD-10-CM | POA: Diagnosis not present

## 2015-12-07 DIAGNOSIS — L03113 Cellulitis of right upper limb: Secondary | ICD-10-CM | POA: Insufficient documentation

## 2015-12-07 DIAGNOSIS — Z7984 Long term (current) use of oral hypoglycemic drugs: Secondary | ICD-10-CM | POA: Diagnosis not present

## 2015-12-07 LAB — CBG MONITORING, ED: GLUCOSE-CAPILLARY: 211 mg/dL — AB (ref 65–99)

## 2015-12-07 MED ORDER — LIDOCAINE-EPINEPHRINE (PF) 2 %-1:200000 IJ SOLN
10.0000 mL | Freq: Once | INTRAMUSCULAR | Status: AC
  Administered 2015-12-07: 10 mL
  Filled 2015-12-07: qty 10

## 2015-12-07 MED ORDER — SULFAMETHOXAZOLE-TRIMETHOPRIM 800-160 MG PO TABS: 1.0000 | ORAL_TABLET | Freq: Two times a day (BID) | ORAL | 0 refills | Status: AC

## 2015-12-07 NOTE — ED Notes (Signed)
PA at bedside.

## 2015-12-07 NOTE — ED Provider Notes (Signed)
[facility] Provider Note   CSN: [id number] Arrival date & time: 12/07/15  1537  By signing my name below, I, Vista Minkobert Ross, attest that this documentation has been prepared under the direction and in the presence of Will Joeleen Wortley PA-C.  Electronically Signed: Vista Minkobert Ross, ED Scribe. 12/07/15. 4:41 PM.  History   Chief Complaint Chief Complaint  Patient presents with  . Abscess    HPI HPI Comments: Bradley Mcclure is a 55 y.o. male who presents to the Emergency Department with a PMHx of diabetes, complaining of gradually worsening possible abscess to his right upper thigh, that has been persistent for the past 3 weeks. Pt [facility] ED on 11/11/15 complaining of a rash, states that he had MRSA and was admitted to the hospital for IV abx. He states that the area started as a "small blackhead" but has been gradually worsening over the past week. Pt is not on any abx currently. He reports that he has noticed "bloody" drainage from the site. Pt has applied antibiotic lotion and neosporin to the area with no relief.  Pt also states that his blood sugar has been running high lately; around 300s.  Pt currently takes Lantus and Victoza for control of his DM. He reports he ran out of his big toes 3 days ago. He reports it is being sent to him by mail order pharmacy currently. He reports having plenty of Lantus. He last checked his CBG approximately 2 hours ago and was 238. He denies any fever, shortness of breath, cough, nausea, vomiting, diarrhea.   The history is provided by the patient. No language interpreter was used.    Past Medical History:  Diagnosis Date  . Hypertension   . IBS (irritable bowel syndrome)   . Pneumonia 2014-2015 X 1  . Type II diabetes mellitus [facility](HCC)     Patient Active Problem List   Diagnosis Date Noted  . Uncontrolled type 2 diabetes mellitus with hyperglycemia, with long-term current use of insulin (HCC)   . Bacteremia   . Morbid obesity due to  excess calories (HCC)   . Cellulitis of abdominal wall 11/11/2015  . Sepsis (HCC) 11/11/2015  . Rash 11/11/2015  . Hypertension   . Diabetes mellitus without complication (HCC)   . Candida-induced panniculitis     Past Surgical History:  Procedure Laterality Date  . TEE WITHOUT CARDIOVERSION N/A 11/17/2015   Procedure: TRANSESOPHAGEAL ECHOCARDIOGRAM (TEE);  Surgeon: Chilton Siiffany [place], MD;  [facility];  Service: Cardiovascular;  Laterality: N/A;  . TONSILLECTOMY  ~ 1977     Home Medications    Prior to Admission medications   Medication Sig Start Date End Date Taking? Authorizing Provider  glucose blood test strip 1 each by Other route as needed for other. Use as instructed    Historical Provider, MD  ibuprofen (ADVIL,MOTRIN) 400 MG tablet Take 1 tablet (400 mg total) by mouth every 6 (six) hours as needed for moderate pain. 11/17/15   Vassie Lollarlos Madera, MD  insulin glargine (LANTUS) 100 UNIT/ML injection Inject 0.75 mLs (75 Units total) into the skin 2 (two) times daily. 11/17/15   Vassie Lollarlos Madera, MD  Liraglutide (VICTOZA) 18 MG/3ML SOPN Inject into the skin daily.     Historical Provider, MD  losartan (COZAAR) 50 MG tablet Take 50 mg by mouth daily.    Historical Provider, MD  metFORMIN (GLUCOPHAGE) 500 MG tablet Take by mouth 2 (two) times daily with a meal.    Historical Provider, MD  pravastatin (  PRAVACHOL) 40 MG tablet Take 40 mg by mouth daily.    Historical Provider, MD  rivaroxaban (XARELTO) 20 MG TABS tablet Take 20 mg by mouth daily with supper.    Historical Provider, MD  Skin Protectants, Misc. (INTERDRY AG TEXTILE 96"[id number]"X144") SHEE Apply 2 packets topically once a week. 11/17/15   Vassie Lollarlos Madera, MD  sulfamethoxazole-trimethoprim (BACTRIM DS,SEPTRA DS) 800-160 MG tablet Take 1 tablet by mouth 2 (two) times daily. 12/07/15 12/14/15  Everlene FarrierWilliam Neiva Maenza, PA-C    Family History Family History  Problem Relation Age of Onset  . Myasthenia gravis Mother   . Diabetes Mellitus I Brother      Social History Social History  Substance Use Topics  . Smoking status: Never Smoker  . Smokeless tobacco: Never Used  . Alcohol use No    Allergies   Review of patient's allergies indicates no known allergies.   Review of Systems Review of Systems  Constitutional: Negative for fever.  Respiratory: Negative for cough and shortness of breath.   Gastrointestinal: Negative for abdominal pain, diarrhea, nausea and vomiting.  Skin: Positive for color change.  Neurological: Negative for dizziness and light-headedness.     Physical Exam Updated Vital Signs BP 150/82 (BP Location: Left Arm)   Pulse 105   Temp 98.5 F (36.9 C)   Resp 16   Ht 6\' 1"  (1.854 m)   Wt 131.5 kg   SpO2 98%   BMI 38.26 kg/m   Physical Exam  Constitutional: He appears well-developed and well-nourished. No distress.  Nontoxic appearing. Obese male.  HENT:  Head: Normocephalic and atraumatic.  Mouth/Throat: Oropharynx is clear and moist.  Eyes: Conjunctivae are normal. Pupils are equal, round, and reactive to light. Right eye exhibits no discharge. Left eye exhibits no discharge.  Neck: Neck supple.  Cardiovascular: Normal rate, regular rhythm, normal heart sounds and intact distal pulses.  Exam reveals no gallop and no friction rub.   No murmur heard. Pulmonary/Chest: Effort normal and breath sounds normal. No respiratory distress. He has no wheezes. He has no rales.  Abdominal: Soft. There is no tenderness.  Musculoskeletal: He exhibits no edema.  Lymphadenopathy:    He has no cervical adenopathy.  Neurological: He is alert. Coordination normal.  Skin: Skin is warm and dry. Capillary refill takes less than 2 seconds. No rash noted. He is not diaphoretic. There is erythema. No pallor.  3cmx1.5cm area of induration with 9cmx6cm area of surrounding erythema to his right upper thigh. No discharge noted. No fluctuance noted.   Psychiatric: He has a normal mood and affect. His behavior is normal.    Nursing note and vitals reviewed.    ED Treatments / Results  DIAGNOSTIC STUDIES: Oxygen Saturation is 98% on RA, normal by my interpretation.  COORDINATION OF CARE: 4:20 PM-Will check CBG and attempt to drain. Discussed treatment plan with pt at bedside and pt agreed to plan.   Labs (all labs ordered are listed, but only abnormal results are displayed) Labs Reviewed  CBG MONITORING, ED - Abnormal; Notable for the following:       Result Value   Glucose-Capillary 211 (*)    All other components within normal limits    EKG  EKG Interpretation None       Radiology No results found.  Procedures .Marland Kitchen.Incision and Drainage Date/Time: 12/07/2015 5:45 PM Performed by: Everlene FarrierANSIE, Ashtyn Freilich Authorized by: Everlene FarrierANSIE, Teresea Donley   Consent:    Consent obtained:  Verbal   Consent given by:  Patient   Risks discussed:  Bleeding, incomplete drainage, pain and infection Location:    Type:  Abscess   Size:  3x5 cm   Location:  Lower extremity   Lower extremity location:  Leg   Leg location:  R upper leg Pre-procedure details:    Skin preparation:  Betadine Anesthesia (see MAR for exact dosages):    Anesthesia method:  Local infiltration   Local anesthetic:  Lidocaine 2% WITH epi Procedure type:    Complexity:  Complex Procedure details:    Incision types:  Single straight   Incision depth:  Dermal   Scalpel blade:  11   Wound management:  Probed and deloculated, irrigated with saline and extensive cleaning   Drainage:  Purulent   Drainage amount:  Scant   Wound treatment:  Wound left open   Packing materials:  None Post-procedure details:    Patient tolerance of procedure:  Tolerated well, no immediate complications   (including critical care time)  Medications Ordered in ED Medications  lidocaine-EPINEPHrine (XYLOCAINE W/EPI) 2 %-1:200000 (PF) injection 10 mL (10 mLs Infiltration Given by Other 12/07/15 1729)   EMERGENCY DEPARTMENT US SOFT TISSUE INTERPRETATION "Study:  Limited Ultrasound of the noted body part in comments below"  INDICATIONS: Soft tissue infection Multiple views of the body part are obtained with a multi-frequency linear probe  PERFORMED BY:  Myself  IMAGES ARCHIVED?: Yes  SIDE:Right   BODY PART:Lower extremity  FINDINGS: Abcess present and Cellulitis present  LIMITATIONS:  Emergent Procedure  INTERPRETATION:  Abcess present and Cellulitis present  COMMENT:  Abscess and cellulitis present to right upper thigh. Dangerous landmarks identified and are more medial to the abscess.     Initial Impression / Assessment and Plan / ED Course  I have reviewed the triage vital signs and the nursing notes.  Pertinent labs & imaging results that were available during my care of the patient were reviewed by me and considered in my medical decision making (see chart for details).  Clinical Course   Patient presented with abscess to right upper thigh. On exam patient is afebrile nontoxic appearing. Blood sugar is 211 in the emergency department. He has no systemic symptoms. On exam he has a large indurated abscess to his right upper thigh with surrounding erythema. Evidence of cellulitis on ultrasound. Patient has previously been diagnosed with MRSA cellulitis. I performed an incision and drainage and obtained purulent material. It was extensively cleaned and washed. Will start on Bactrim to cover MRSA. Encourage close follow-up by his PCP for wound recheck in 2 days. I discussed if he has developing systemic symptoms or worsening abscess he needs to return to the emergency department for reevaluation immediately. The patient is safe to be discharged at this time. Patient reports he has more Victoza on his way to him in the mail. He does not need any refills on his diabetes medications. I advised the patient to follow-up with their primary care provider this week. I advised the patient to return to the emergency department with new or worsening  symptoms or new concerns. The patient verbalized understanding and agreement with plan.    This patient was discussed with and evaluated by Dr. Patria Mane who agrees with assessment and plan.   Final Clinical Impressions(s) / ED Diagnoses   Final diagnoses:  Abscess and cellulitis    New Prescriptions New Prescriptions   SULFAMETHOXAZOLE-TRIMETHOPRIM (BACTRIM DS,SEPTRA DS) 800-160 MG TABLET    Take 1 tablet by mouth 2 (two) times daily.    I personally performed the services  described in this documentation, which was scribed in my presence. The recorded information has been reviewed and is accurate.      Everlene Farrier, PA-C 12/07/15 1801    Azalia Bilis, MD 12/07/15 [phone]

## 2015-12-07 NOTE — ED Triage Notes (Signed)
Pt c/o abscess to right groin x 1 week

## 2015-12-07 NOTE — Discharge Instructions (Signed)
If symptoms worsen or you start to develop fevers you need to return to the ER like we discussed. Please follow up this week with your primary doctor for recheck.

## 2015-12-07 NOTE — ED Notes (Signed)
Pt reports spot on his right upper leg. Reports it is either MRSA or a massive blackhead. Reports had MRSA recently and he had finished up medication regimen Thursday before last

## 2015-12-11 ENCOUNTER — Emergency Department (HOSPITAL_BASED_OUTPATIENT_CLINIC_OR_DEPARTMENT_OTHER)
Admission: EM | Admit: 2015-12-11 | Discharge: 2015-12-11 | Disposition: A | Discharge status: Home / Self Care | Payer: Non-veteran care | Attending: Emergency Medicine | Admitting: Emergency Medicine

## 2015-12-11 ENCOUNTER — Encounter (HOSPITAL_BASED_OUTPATIENT_CLINIC_OR_DEPARTMENT_OTHER): Payer: Self-pay | Admitting: *Deleted

## 2015-12-11 DIAGNOSIS — Z7984 Long term (current) use of oral hypoglycemic drugs: Secondary | ICD-10-CM | POA: Diagnosis not present

## 2015-12-11 DIAGNOSIS — Z794 Long term (current) use of insulin: Secondary | ICD-10-CM | POA: Diagnosis not present

## 2015-12-11 DIAGNOSIS — I10 Essential (primary) hypertension: Secondary | ICD-10-CM | POA: Diagnosis not present

## 2015-12-11 DIAGNOSIS — L03115 Cellulitis of right lower limb: Secondary | ICD-10-CM | POA: Insufficient documentation

## 2015-12-11 DIAGNOSIS — Z5189 Encounter for other specified aftercare: Secondary | ICD-10-CM

## 2015-12-11 DIAGNOSIS — Z79899 Other long term (current) drug therapy: Secondary | ICD-10-CM | POA: Insufficient documentation

## 2015-12-11 DIAGNOSIS — E119 Type 2 diabetes mellitus without complications: Secondary | ICD-10-CM | POA: Diagnosis not present

## 2015-12-11 LAB — I-STAT CG4 LACTIC ACID, ED: LACTIC ACID, VENOUS: 1.81 mmol/L (ref 0.5–1.9)

## 2015-12-11 LAB — CBC WITH DIFFERENTIAL/PLATELET
BASOS ABS: 0 10*3/uL (ref 0.0–0.1)
BASOS PCT: 0 %
EOS ABS: 0.3 10*3/uL (ref 0.0–0.7)
Eosinophils Relative: 5 %
HEMATOCRIT: 38.4 % — AB (ref 39.0–52.0)
Hemoglobin: 13 g/dL (ref 13.0–17.0)
Lymphocytes Relative: 41 %
Lymphs Abs: 2.2 10*3/uL (ref 0.7–4.0)
MCH: 31.7 pg (ref 26.0–34.0)
MCHC: 33.9 g/dL (ref 30.0–36.0)
MCV: 93.7 fL (ref 78.0–100.0)
MONO ABS: 0.6 10*3/uL (ref 0.1–1.0)
Monocytes Relative: 11 %
NEUTROS ABS: 2.3 10*3/uL (ref 1.7–7.7)
Neutrophils Relative %: 43 %
PLATELETS: 171 10*3/uL (ref 150–400)
RBC: 4.1 MIL/uL — ABNORMAL LOW (ref 4.22–5.81)
RDW: 13.3 % (ref 11.5–15.5)
WBC: 5.4 10*3/uL (ref 4.0–10.5)

## 2015-12-11 LAB — BASIC METABOLIC PANEL
Anion gap: 7 (ref 5–15)
BUN: 8 mg/dL (ref 6–20)
CHLORIDE: 102 mmol/L (ref 101–111)
CO2: 25 mmol/L (ref 22–32)
CREATININE: 0.68 mg/dL (ref 0.61–1.24)
Calcium: 9 mg/dL (ref 8.9–10.3)
GFR calc non Af Amer: >60 mL/min (ref >60)
Glucose, Bld: 280 mg/dL — ABNORMAL HIGH (ref 65–99)
Potassium: 4.2 mmol/L (ref 3.5–5.1)
SODIUM: 134 mmol/L — AB (ref 135–145)

## 2015-12-11 LAB — CBG MONITORING, ED: GLUCOSE-CAPILLARY: 279 mg/dL — AB (ref 65–99)

## 2015-12-11 MED ORDER — DEXTROSE 5 % IV SOLN
1.0000 g | Freq: Once | INTRAVENOUS | Status: AC
  Administered 2015-12-11: 1 g via INTRAVENOUS
  Filled 2015-12-11: qty 10

## 2015-12-11 MED ORDER — AMOXICILLIN-POT CLAVULANATE 875-125 MG PO TABS: 1.0000 | ORAL_TABLET | Freq: Two times a day (BID) | ORAL | 0 refills | Status: DC

## 2015-12-11 MED ORDER — SULFAMETHOXAZOLE-TRIMETHOPRIM 800-160 MG PO TABS
1.0000 | ORAL_TABLET | Freq: Two times a day (BID) | ORAL | 0 refills | Status: AC
Start: 2015-12-11 — End: 2015-12-18

## 2015-12-11 MED ORDER — VANCOMYCIN HCL IN DEXTROSE 1-5 GM/200ML-% IV SOLN
1000.0000 mg | Freq: Once | INTRAVENOUS | Status: AC
Start: 2015-12-11 — End: 2015-12-11
  Administered 2015-12-11: 1000 mg via INTRAVENOUS
  Filled 2015-12-11: qty 200

## 2015-12-11 MED ORDER — MORPHINE SULFATE (PF) 4 MG/ML IV SOLN
4.0000 mg | Freq: Once | INTRAVENOUS | Status: AC
  Administered 2015-12-11: 4 mg via INTRAVENOUS
  Filled 2015-12-11: qty 1

## 2015-12-11 MED ORDER — SODIUM CHLORIDE 0.9 % IV BOLUS (SEPSIS)
1000.0000 mL | Freq: Once | INTRAVENOUS | Status: AC
  Administered 2015-12-11: 1000 mL via INTRAVENOUS

## 2015-12-11 NOTE — ED Provider Notes (Signed)
Care assumed from previous provider PA Bradley Mcclure. Please see note for further details. Case discussed, plan agreed upon. Patient currently receiving IV antibiotics. Plan to discharge home after antibiotic completion.  Gen: afebrile, VSS, NAD HEENT: [place]/AT Resp: no resp distress CV: RRR Skin: right anterior thigh with erythema, swelling, and tenderness.  MsK: moving all extremities well Neuro: A&O x4  IV ABX completed. Patient requesting discharge to home. Patient strongly encouraged to follow up with surgery-call first thing Monday morning to schedule appointment. Bactrim and Augmentin. Reasons to return discussed.    Bradley [facility] Hospital[facility] Pilcher Bradley Henri, PA-C 12/11/15 1937    Arby BarretteMarcy Pfeiffer, MD 12/12/15 [phone]

## 2015-12-11 NOTE — ED Notes (Signed)
MD at bedside. 

## 2015-12-11 NOTE — ED Provider Notes (Signed)
[facility]Y [facility] Provider Note   CSN: [id number] Arrival date & time: 12/11/15  1201     History   Chief Complaint Chief Complaint  Patient presents with  . Wound Check    HPI Bradley Mcclure is a 55 y.o. male.  HPI   Bradley Mcclure is a 55 y.o. male, with a history of Hypertension and DM, presenting to the ED for a wound check of an I&D wound on the upper right thigh. Pt had the I&D performed on August 21.  States pain, redness, and swelling to the lesion on the upper left thigh continued to get worse despite the antibiotics and I&D. Pt has been taking tylenol with some relief of pain, last dose around 4 AM this morning.  Pt was previously seen, admitted to [facility], and treated with IV ABX for MRSA sepsis beginning July 26. Pt was admitted for 6 days. This MRSA infection was located under the pannus.  Denies fever/chills, nausea/vomiting, lymphadenopathy, neuro deficits, or any other complaints.   Past Medical History:  Diagnosis Date  . Hypertension   . IBS (irritable bowel syndrome)   . Pneumonia 2014-2015 X 1  . Type II diabetes mellitus [facility])     Patient Active Problem List   Diagnosis Date Noted  . Uncontrolled type 2 diabetes mellitus with hyperglycemia, with long-term current use of insulin (HCC)   . Bacteremia   . Morbid obesity due to excess calories (HCC)   . Cellulitis of abdominal wall 11/11/2015  . Sepsis (HCC) 11/11/2015  . Rash 11/11/2015  . Hypertension   . Diabetes mellitus without complication (HCC)   . Candida-induced panniculitis     Past Surgical History:  Procedure Laterality Date  . TEE WITHOUT CARDIOVERSION N/A 11/17/2015   Procedure: TRANSESOPHAGEAL ECHOCARDIOGRAM (TEE);  Surgeon: Chilton Si, MD;  Location: [facility] ENDOSCOPY;  Service: Cardiovascular;  Laterality: N/A;  . TONSILLECTOMY  ~ 1977       Home Medications    Prior to Admission medications   Medication Sig Start Date End Date Taking? Authorizing Provider    amoxicillin-clavulanate (AUGMENTIN) 875-125 MG tablet Take 1 tablet by mouth every 12 (twelve) hours. 12/11/15   Kaipo Ardis C Jahliyah Trice, PA-C  glucose blood test strip 1 each by Other route as needed for other. Use as instructed    Historical Provider, MD  ibuprofen (ADVIL,MOTRIN) 400 MG tablet Take 1 tablet (400 mg total) by mouth every 6 (six) hours as needed for moderate pain. 11/17/15   Vassie Loll, MD  insulin glargine (LANTUS) 100 UNIT/ML injection Inject 0.75 mLs (75 Units total) into the skin 2 (two) times daily. 11/17/15   Vassie Loll, MD  Liraglutide (VICTOZA) 18 MG/3ML SOPN Inject into the skin daily.     Historical Provider, MD  losartan (COZAAR) 50 MG tablet Take 50 mg by mouth daily.    Historical Provider, MD  metFORMIN (GLUCOPHAGE) 500 MG tablet Take by mouth 2 (two) times daily with a meal.    Historical Provider, MD  pravastatin (PRAVACHOL) 40 MG tablet Take 40 mg by mouth daily.    Historical Provider, MD  rivaroxaban (XARELTO) 20 MG TABS tablet Take 20 mg by mouth daily with supper.    Historical Provider, MD  Skin Protectants, Misc. (INTERDRY AG TEXTILE 40"J811") SHEE Apply 2 packets topically once a week. 11/17/15   Vassie Loll, MD  sulfamethoxazole-trimethoprim (BACTRIM DS,SEPTRA DS) 800-160 MG tablet Take 1 tablet by mouth 2 (two) times daily. 12/07/15 12/14/15  Everlene Farrier, PA-C  sulfamethoxazole-trimethoprim (BACTRIM DS,SEPTRA DS) 800-160 MG tablet Take 1 tablet by mouth 2 (two) times daily. 12/11/15 12/18/15  Anselm PancoastShawn C Yoshie Kosel, PA-C    Family History Family History  Problem Relation Age of Onset  . Myasthenia gravis Mother   . Diabetes Mellitus I Brother     Social History Social History  Substance Use Topics  . Smoking status: Never Smoker  . Smokeless tobacco: Never Used  . Alcohol use No     Allergies   Review of patient's allergies indicates no known allergies.   Review of Systems Review of Systems  Constitutional: Negative for chills and fever.  Gastrointestinal:  Negative for abdominal pain, nausea and vomiting.  Skin: Positive for wound.  All other systems reviewed and are negative.    Physical Exam Updated Vital Signs BP 150/77   Pulse 102   Temp 98 F (36.7 C) (Oral)   Resp 18   Ht 6\' 1"  (1.854 m)   Wt 131.5 kg   SpO2 95%   BMI 38.26 kg/m   Physical Exam  Constitutional: He appears well-developed and well-nourished. No distress.  HENT:  Head: Normocephalic and atraumatic.  Eyes: Conjunctivae are normal.  Neck: Neck supple.  Cardiovascular: Normal rate, regular rhythm, normal heart sounds and intact distal pulses.   Pulmonary/Chest: Effort normal and breath sounds normal. No respiratory distress.  Abdominal: Soft. There is no tenderness. There is no guarding.  Musculoskeletal: He exhibits no edema or tenderness.  Lymphadenopathy:    He has no cervical adenopathy.  Neurological: He is alert.  Skin: Skin is warm and dry. He is not diaphoretic.  6 x 5 cm raised area of tenderness, swelling, and induration noted to the right anterior upper thigh.  Psychiatric: He has a normal mood and affect. His behavior is normal.  Nursing note and vitals reviewed.    ED Treatments / Results  Labs (all labs ordered are listed, but only abnormal results are displayed) Labs Reviewed  CBC WITH DIFFERENTIAL/PLATELET - Abnormal; Notable for the following:       Result Value   RBC 4.10 (*)    HCT 38.4 (*)    All other components within normal limits  BASIC METABOLIC PANEL - Abnormal; Notable for the following:    Sodium 134 (*)    Glucose, Bld 280 (*)    All other components within normal limits  CBG MONITORING, ED - Abnormal; Notable for the following:    Glucose-Capillary 279 (*)    All other components within normal limits  I-STAT CG4 LACTIC ACID, ED  I-STAT CG4 LACTIC ACID, ED    EKG  EKG Interpretation None       Radiology No results found.  Procedures Procedures (including critical care time)  Medications Ordered in  ED Medications  vancomycin (VANCOCIN) IVPB 1000 mg/200 mL premix (not administered)  sodium chloride 0.9 % bolus 1,000 mL (0 mLs Intravenous Stopped 12/11/15 1654)  morphine 4 MG/ML injection 4 mg (4 mg Intravenous Given 12/11/15 1356)  cefTRIAXone (ROCEPHIN) 1 g in dextrose 5 % 50 mL IVPB (1 g Intravenous New Bag/Given 12/11/15 1653)     Initial Impression / Assessment and Plan / ED Course  I have reviewed the triage vital signs and the nursing notes.  Pertinent labs & imaging results that were available during my care of the patient were reviewed by me and considered in my medical decision making (see chart for details).  Clinical Course    Bradley Mcclure presents with a worsening area  of infection on the upper right thigh.  Findings and plan of care discussed with [name], MD. Dr. Donnald Garre personally evaluated and examined this patient.  Patient's area of presumed infection on the upper right thigh does not seem to be responding to initial antibiotic therapy. IV antibiotics given here in the ED. Patient has no signs of sepsis. Inside ultrasound evaluation revealed no central fluid collection that would benefit from I&D. Condition of patient care handoff report given to [facility], PA-C. Plan: Continue the patient's IV antibiotics. Reassess for any new symptoms or signs of sepsis. Discharge patient with extension of his Bactrim prescription and the addition of Augmentin. Patient is to also have a general surgery consult.  Vitals:   12/11/15 1206 12/11/15 1333 12/11/15 1358 12/11/15 1655  BP: 150/77  130/75 141/78  Pulse: 102  92 102  Resp: 18  14 14   Temp: 98 F (36.7 C) 98.2 F (36.8 C)    TempSrc: Oral Rectal    SpO2: 95%  97% 100%  Weight:      Height:            Final Clinical Impressions(s) / ED Diagnoses   Final diagnoses:  Visit for wound check  Cellulitis of right lower extremity    New Prescriptions New Prescriptions   AMOXICILLIN-CLAVULANATE  (AUGMENTIN) 875-125 MG TABLET    Take 1 tablet by mouth every 12 (twelve) hours.   SULFAMETHOXAZOLE-TRIMETHOPRIM (BACTRIM DS,SEPTRA DS) 800-160 MG TABLET    Take 1 tablet by mouth 2 (two) times daily.     Anselm Pancoast, PA-C 12/11/15 1734    [name], MD 12/12/15 [phone]

## 2015-12-11 NOTE — ED Triage Notes (Signed)
Here to have an abscess I&D wound check to his right upper leg.

## 2015-12-11 NOTE — ED Notes (Signed)
Pt placed on contact precautions for MRSA.

## 2015-12-11 NOTE — Discharge Instructions (Signed)
You have been seen today for a wound check. There was evidence of cellulitis and worsening infection. You were given IV antibiotics and IV fluids here in the ED. Follow up with general surgery as soon as possible due to this recurrent issue. Return to ED should symptoms worsen.

## 2015-12-11 NOTE — ED Notes (Signed)
Pt was extremely rude throughout his time here and repeatedly exposed himself despite attempts to cover him.

## 2015-12-14 ENCOUNTER — Emergency Department (HOSPITAL_BASED_OUTPATIENT_CLINIC_OR_DEPARTMENT_OTHER)
Admission: EM | Admit: 2015-12-14 | Discharge: 2015-12-14 | Disposition: A | Discharge status: Home / Self Care | Payer: Non-veteran care | Attending: Emergency Medicine | Admitting: Emergency Medicine

## 2015-12-14 ENCOUNTER — Encounter (HOSPITAL_BASED_OUTPATIENT_CLINIC_OR_DEPARTMENT_OTHER): Payer: Self-pay | Admitting: *Deleted

## 2015-12-14 DIAGNOSIS — I10 Essential (primary) hypertension: Secondary | ICD-10-CM | POA: Diagnosis not present

## 2015-12-14 DIAGNOSIS — Z4801 Encounter for change or removal of surgical wound dressing: Secondary | ICD-10-CM | POA: Insufficient documentation

## 2015-12-14 DIAGNOSIS — Z794 Long term (current) use of insulin: Secondary | ICD-10-CM | POA: Diagnosis not present

## 2015-12-14 DIAGNOSIS — E119 Type 2 diabetes mellitus without complications: Secondary | ICD-10-CM | POA: Diagnosis not present

## 2015-12-14 DIAGNOSIS — Z7984 Long term (current) use of oral hypoglycemic drugs: Secondary | ICD-10-CM | POA: Insufficient documentation

## 2015-12-14 DIAGNOSIS — Z5189 Encounter for other specified aftercare: Secondary | ICD-10-CM

## 2015-12-14 DIAGNOSIS — Z79899 Other long term (current) drug therapy: Secondary | ICD-10-CM | POA: Diagnosis not present

## 2015-12-14 NOTE — ED Notes (Signed)
Pt. Left room after he was given d/c instructions by the Dr. And his dressing was placed on the Pt.  RN Rosana Hoes met Pt. At check out window and gave Pt. The paper work for his d/c instructions.  Pt. Then left facility.

## 2015-12-14 NOTE — ED Triage Notes (Signed)
States his dressing needs to be replaced on his MRSA wound right groin.

## 2015-12-14 NOTE — Discharge Instructions (Signed)
Please read and follow all provided instructions.  Your diagnoses today include:  1. Encounter for wound re-check    Tests performed today include:  Vital signs. See below for your results today.   Medications prescribed:   None  Take any prescribed medications only as directed.   Home care instructions:   Follow any educational materials contained in this packet  Follow-up instructions: Return to the Emergency Department in 48 hours for a recheck if your symptoms are not significantly improved.  Please follow-up with your primary care provider in the next week for recheck.   Return instructions:  Return to the Emergency Department if you have:  Fever  Worsening symptoms  Worsening pain  Worsening swelling  Redness of the skin that moves away from the affected area, especially if it streaks away from the affected area   Any other emergent concerns  Your vital signs today were: BP 145/94    Pulse 100    Temp 97.6 F (36.4 C) (Oral)    Resp 18    Ht 6\' 1"  (1.854 m)    Wt 131.5 kg    SpO2 98%    BMI 38.26 kg/m  If your blood pressure (BP) was elevated above 135/85 this visit, please have this repeated by your doctor within one month. --------------

## 2015-12-14 NOTE — ED Provider Notes (Signed)
[facility] Provider Note   CSN: [id number] Arrival date & time: 12/14/15  1304     History   Chief Complaint Chief Complaint  Patient presents with  . Wound Check    HPI Bradley Mcclure is a 55 y.o. male.  Patient with recent diagnosis of abscess of the right groin presents for wound recheck. Patient has a history of diabetes. He is currently on Bactrim and Augmentin. Patient was seen in emergency department 3 days ago, had I&D performed, received IV antibiotics. Patient reports generalized improvement in the area. It is still sore but is not swollen like it was. Patient denies fevers, nausea or vomiting. He states that the bandage came off at home yesterday. He is requesting that the wound be re-bandaged today. He has not been doing any warm soaks. Patient has a primary care physician at the [place], but states "they don't know anything" and has not followed up with them. The onset of this condition was acute. The course is improving. Aggravating factors: none. Alleviating factors: none.        Past Medical History:  Diagnosis Date  . Hypertension   . IBS (irritable bowel syndrome)   . Pneumonia 2014-2015 X 1  . Type II diabetes mellitus [facility])     Patient Active Problem List   Diagnosis Date Noted  . Uncontrolled type 2 diabetes mellitus with hyperglycemia, with long-term current use of insulin (HCC)   . Bacteremia   . Morbid obesity due to excess calories (HCC)   . Cellulitis of abdominal wall 11/11/2015  . Sepsis (HCC) 11/11/2015  . Rash 11/11/2015  . Hypertension   . Diabetes mellitus without complication (HCC)   . Candida-induced panniculitis     Past Surgical History:  Procedure Laterality Date  . TEE WITHOUT CARDIOVERSION N/A 11/17/2015   Procedure: TRANSESOPHAGEAL ECHOCARDIOGRAM (TEE);  Surgeon: Chilton Si, MD;  Location: [facility] ENDOSCOPY;  Service: Cardiovascular;  Laterality: N/A;  . TONSILLECTOMY  ~ 1977       Home Medications    Prior  to Admission medications   Medication Sig Start Date End Date Taking? Authorizing Provider  amoxicillin-clavulanate (AUGMENTIN) 875-125 MG tablet Take 1 tablet by mouth every 12 (twelve) hours. 12/11/15   Shawn C Joy, PA-C  glucose blood test strip 1 each by Other route as needed for other. Use as instructed    Historical Provider, MD  ibuprofen (ADVIL,MOTRIN) 400 MG tablet Take 1 tablet (400 mg total) by mouth every 6 (six) hours as needed for moderate pain. 11/17/15   Vassie Loll, MD  insulin glargine (LANTUS) 100 UNIT/ML injection Inject 0.75 mLs (75 Units total) into the skin 2 (two) times daily. 11/17/15   Vassie Loll, MD  Liraglutide (VICTOZA) 18 MG/3ML SOPN Inject into the skin daily.     Historical Provider, MD  losartan (COZAAR) 50 MG tablet Take 50 mg by mouth daily.    Historical Provider, MD  metFORMIN (GLUCOPHAGE) 500 MG tablet Take by mouth 2 (two) times daily with a meal.    Historical Provider, MD  pravastatin (PRAVACHOL) 40 MG tablet Take 40 mg by mouth daily.    Historical Provider, MD  rivaroxaban (XARELTO) 20 MG TABS tablet Take 20 mg by mouth daily with supper.    Historical Provider, MD  Skin Protectants, Misc. (INTERDRY AG TEXTILE 40"J811") SHEE Apply 2 packets topically once a week. 11/17/15   Vassie Loll, MD  sulfamethoxazole-trimethoprim (BACTRIM DS,SEPTRA DS) 800-160 MG tablet Take 1 tablet by mouth 2 (two) times  daily. 12/07/15 12/14/15  Everlene Farrier, PA-C  sulfamethoxazole-trimethoprim (BACTRIM DS,SEPTRA DS) 800-160 MG tablet Take 1 tablet by mouth 2 (two) times daily. 12/11/15 12/18/15  Anselm Pancoast, PA-C    Family History Family History  Problem Relation Age of Onset  . Myasthenia gravis Mother   . Diabetes Mellitus I Brother     Social History Social History  Substance Use Topics  . Smoking status: Never Smoker  . Smokeless tobacco: Never Used  . Alcohol use No     Allergies   Review of patient's allergies indicates no known allergies.   Review of  Systems Review of Systems  Constitutional: Negative for fever.  Gastrointestinal: Negative for nausea and vomiting.  Skin: Negative for color change.       Positive for abscess  Hematological: Negative for adenopathy.     Physical Exam Updated Vital Signs BP 145/94   Pulse 100   Temp 97.6 F (36.4 C) (Oral)   Resp 18   Ht 6\' 1"  (1.854 m)   Wt 131.5 kg   SpO2 98%   BMI 38.26 kg/m   Physical Exam  Constitutional: He appears well-developed and well-nourished.  HENT:  Head: Normocephalic and atraumatic.  Eyes: Conjunctivae are normal.  Neck: Normal range of motion. Neck supple.  Pulmonary/Chest: No respiratory distress.  Neurological: He is alert.  Skin: Skin is warm and dry.  Patient with right groin abscess. There are marks showing the extent of previous cellulitis. Cellulitis has receded to well within these marks. Patient is minimally tender. No active drainage. There is some granulation tissue noted in the center of the abscess.  Psychiatric: He has a normal mood and affect.  Nursing note and vitals reviewed.    ED Treatments / Results   Procedures Procedures (including critical care time)   Initial Impression / Assessment and Plan / ED Course  I have reviewed the triage vital signs and the nursing notes.  Pertinent labs & imaging results that were available during my care of the patient were reviewed by me and considered in my medical decision making (see chart for details).  Clinical Course   1:41 PM Patient seen and examined. Bandage replaced. Patient counseled to perform warm soaks or warm compresses if possible. Encouraged patient to take entire course of antibiotics as prescribed. Encouraged PCP follow-up in 2 days.  The patient was urged to return to the Emergency Department urgently with worsening pain, swelling, expanding erythema especially if it streaks away from the affected area, fever, or if they have any other concerns.   The patient was urged to  return to the Emergency Department in 48 hours for wound recheck if the area is not significantly improved.  The patient verbalized understanding and stated agreement with this plan.      Vital signs reviewed and are as follows: BP 145/94   Pulse 100   Temp 97.6 F (36.4 C) (Oral)   Resp 18   Ht 6\' 1"  (1.854 m)   Wt 131.5 kg   SpO2 98%   BMI 38.26 kg/m     Final Clinical Impressions(s) / ED Diagnoses   Final diagnoses:  Encounter for wound re-check   Patient with gradually improving right groin abscess. No systemic symptoms of illness. Cellulitis appears to have receded. No active drainage. No indication or significant induration to reopen the abscess at this time. Patient to monitor closely, return in follow-up with PCP as needed. He is already on Bactrim and Augmentin  New Prescriptions New  Prescriptions   No medications on file     Renne CriglerJoshua Cherisa Brucker, PA-C 12/14/15 1343    Nira ConnPedro Eduardo Cardama, MD 12/14/15 2034

## 2015-12-14 NOTE — ED Notes (Signed)
Pt. Did not sign out with RN

## 2015-12-21 ENCOUNTER — Encounter (HOSPITAL_BASED_OUTPATIENT_CLINIC_OR_DEPARTMENT_OTHER): Payer: Self-pay | Admitting: *Deleted

## 2015-12-21 ENCOUNTER — Emergency Department (HOSPITAL_BASED_OUTPATIENT_CLINIC_OR_DEPARTMENT_OTHER)
Admission: EM | Admit: 2015-12-21 | Discharge: 2015-12-21 | Disposition: A | Discharge status: Home / Self Care | Payer: Non-veteran care | Attending: Emergency Medicine | Admitting: Emergency Medicine

## 2015-12-21 DIAGNOSIS — I10 Essential (primary) hypertension: Secondary | ICD-10-CM | POA: Diagnosis not present

## 2015-12-21 DIAGNOSIS — Z7984 Long term (current) use of oral hypoglycemic drugs: Secondary | ICD-10-CM | POA: Diagnosis not present

## 2015-12-21 DIAGNOSIS — Z794 Long term (current) use of insulin: Secondary | ICD-10-CM | POA: Insufficient documentation

## 2015-12-21 DIAGNOSIS — E119 Type 2 diabetes mellitus without complications: Secondary | ICD-10-CM | POA: Diagnosis not present

## 2015-12-21 DIAGNOSIS — Z79899 Other long term (current) drug therapy: Secondary | ICD-10-CM | POA: Diagnosis not present

## 2015-12-21 DIAGNOSIS — Z4800 Encounter for change or removal of nonsurgical wound dressing: Secondary | ICD-10-CM | POA: Insufficient documentation

## 2015-12-21 NOTE — ED Notes (Addendum)
[place] [facility] (Dr. Ena DawleySekhon), instructed to come to ED d/t unable to get ride to [facility]. Being treated for R inguinal groin MRSA wound, covered with multiple band-aids at this time, wound red, hot, indurated, swollen, open and draining pus, no packing in place, (denies: fever, nv, dizziness), rates pain 1/10, taking Tylenol ES, last taken yesterday, finished Amox and Bactrim, (pt not sure of the name of abx), "finished last Friday", pt is poor historian.

## 2015-12-21 NOTE — ED Provider Notes (Signed)
[facility] Provider Note   CSN: [id number] Arrival date & time: 12/21/15  1729  By signing my name below, I, Bradley Mcclure, attest that this documentation has been prepared under the direction and in the presence of Bradley Racer, MD. Electronically Signed: Angelene Mcclure, ED Scribe. 12/21/15. 8:29 PM.    History   Chief Complaint Chief Complaint  Patient presents with  . Wound Check    HPI Comments: Bradley Mcclure is a 55 y.o. male with a hx of DM II and hypertenstion who presents to the Emergency Department requesting dressing change. He explains that he was initially seen on 12/07/15 for an abscess where he had an I & D. He adds that he was discharged with Bactrim, which he states he has been compliant with. He was then seen on 12/11/15 for a wound check and discharged with Augmentin. He states that he is here today to have his dressing changed. He adds that he has not been to the [place] for follow up because they act as though "they are scared of MRSA". No alleviating factors noted. He has not tried any medications PTA. He denies any fever, chills, nausea, vomiting, or diarrhea.   The history is provided by the patient. No language interpreter was used.  Wound Check  This is a new problem. The current episode started more than 1 week ago. Nothing aggravates the symptoms. Nothing relieves the symptoms. He has tried nothing for the symptoms.    Past Medical History:  Diagnosis Date  . Hypertension   . IBS (irritable bowel syndrome)   . Pneumonia 2014-2015 X 1  . Type II diabetes mellitus [facility])     Patient Active Problem List   Diagnosis Date Noted  . Uncontrolled type 2 diabetes mellitus with hyperglycemia, with long-term current use of insulin (HCC)   . Bacteremia   . Morbid obesity due to excess calories (HCC)   . Cellulitis of abdominal wall 11/11/2015  . Sepsis (HCC) 11/11/2015  . Rash 11/11/2015  . Hypertension   . Diabetes mellitus without  complication (HCC)   . Candida-induced panniculitis     Past Surgical History:  Procedure Laterality Date  . TEE WITHOUT CARDIOVERSION N/A 11/17/2015   Procedure: TRANSESOPHAGEAL ECHOCARDIOGRAM (TEE);  Surgeon: Chilton Si, MD;  Location: [facility] ENDOSCOPY;  Service: Cardiovascular;  Laterality: N/A;  . TONSILLECTOMY  ~ 1977       Home Medications    Prior to Admission medications   Medication Sig Start Date End Date Taking? Authorizing Provider  amoxicillin-clavulanate (AUGMENTIN) 875-125 MG tablet Take 1 tablet by mouth every 12 (twelve) hours. 12/11/15   Shawn C Joy, PA-C  glucose blood test strip 1 each by Other route as needed for other. Use as instructed    Historical Provider, MD  ibuprofen (ADVIL,MOTRIN) 400 MG tablet Take 1 tablet (400 mg total) by mouth every 6 (six) hours as needed for moderate pain. 11/17/15   Vassie Loll, MD  insulin glargine (LANTUS) 100 UNIT/ML injection Inject 0.75 mLs (75 Units total) into the skin 2 (two) times daily. 11/17/15   Vassie Loll, MD  Liraglutide (VICTOZA) 18 MG/3ML SOPN Inject into the skin daily.     Historical Provider, MD  losartan (COZAAR) 50 MG tablet Take 50 mg by mouth daily.    Historical Provider, MD  metFORMIN (GLUCOPHAGE) 500 MG tablet Take by mouth 2 (two) times daily with a meal.    Historical Provider, MD  pravastatin (PRAVACHOL) 40 MG tablet Take 40 mg by  mouth daily.    Historical Provider, MD  rivaroxaban (XARELTO) 20 MG TABS tablet Take 20 mg by mouth daily with supper.    Historical Provider, MD  Skin Protectants, Misc. (INTERDRY AG TEXTILE 40"J811") SHEE Apply 2 packets topically once a week. 11/17/15   Vassie Loll, MD    Family History Family History  Problem Relation Age of Onset  . Myasthenia gravis Mother   . Diabetes Mellitus I Brother     Social History Social History  Substance Use Topics  . Smoking status: Never Smoker  . Smokeless tobacco: Never Used  . Alcohol use No     Allergies   Review of  patient's allergies indicates no known allergies.   Review of Systems Review of Systems  Constitutional: Negative for chills and fever.  Gastrointestinal: Negative for diarrhea, nausea and vomiting.  Skin: Positive for wound.  Neurological: Negative for weakness and numbness.  All other systems reviewed and are negative.    Physical Exam Updated Vital Signs BP 140/73 (BP Location: Left Arm)   Pulse 113   Temp 98.1 F (36.7 C) (Oral)   Resp 18   Ht 6\' 1"  (1.854 m)   Wt 290 lb (131.5 kg)   SpO2 97%   BMI 38.26 kg/m   Physical Exam  Constitutional: He is oriented to person, place, and time. He appears well-developed and well-nourished.  HENT:  Head: Normocephalic and atraumatic.  Mouth/Throat: Oropharynx is clear and moist.  Eyes: EOM are normal. Pupils are equal, round, and reactive to light.  Neck: Normal range of motion. Neck supple.  Cardiovascular: Normal rate and regular rhythm.   Pulmonary/Chest: Effort normal and breath sounds normal.  Abdominal: Soft. Bowel sounds are normal. There is no tenderness. There is no rebound and no guarding.  Musculoskeletal: Normal range of motion. He exhibits no edema or tenderness.  Neurological: He is alert and oriented to person, place, and time.  Skin: Skin is warm and dry. No rash noted. No erythema.  Patient has ulceration roughly 1-1/2 cm in diameter to the right upper thigh. There is some necrotic material at the base but no purulent discharge. There is some surrounding erythema that is significantly decreased from marked edges of previous cellulitis. There is no induration or tenderness.  Psychiatric: He has a normal mood and affect. His behavior is normal.  Nursing note and vitals reviewed.    ED Treatments / Results  DIAGNOSTIC STUDIES: Oxygen Saturation is 97% on RA, normal by my interpretation.    COORDINATION OF CARE: 8:26 PM- Pt advised of plan for treatment and pt agrees. Pt will receive wound care here.     Labs (all labs ordered are listed, but only abnormal results are displayed) Labs Reviewed - No data to display  EKG  EKG Interpretation None       Radiology No results found.  Procedures Procedures (including critical care time)  Medications Ordered in ED Medications - No data to display   Initial Impression / Assessment and Plan / ED Course  Bradley Racer, MD has reviewed the triage vital signs and the nursing notes.  Pertinent labs & imaging results that were available during my care of the patient were reviewed by me and considered in my medical decision making (see chart for details).  Clinical Course   Wet to dry dressing placed. Patient advised to do wet-to-dry dressings daily to aid in wound healing. Do not believe there is no active infection and therefore does not need another round of  antibiotics. Patient has follow-up with his infectious disease doctor this week. Return precautions given.  Final Clinical Impressions(s) / ED Diagnoses   Final diagnoses:  None    New Prescriptions New Prescriptions   No medications on file   I personally performed the services described in this documentation, which was scribed in my presence. The recorded information has been reviewed and is accurate.      Bradley Raceravid Arayah Krouse, MD 12/21/15 2149

## 2015-12-21 NOTE — ED Notes (Signed)
EDP at [facility] 

## 2015-12-21 NOTE — ED Triage Notes (Signed)
Pt reports that he is here for a wound to his right groin-requesting for the bandage to be changed.

## 2015-12-24 ENCOUNTER — Ambulatory Visit (INDEPENDENT_AMBULATORY_CARE_PROVIDER_SITE_OTHER): Payer: Medicare Other | Admitting: Infectious Disease

## 2015-12-24 ENCOUNTER — Encounter: Payer: Self-pay | Admitting: Infectious Disease

## 2015-12-24 VITALS — BP 131/88 | HR 99 | Temp 98.1°F | Ht 73.0 in | Wt 295.0 lb

## 2015-12-24 DIAGNOSIS — B379 Candidiasis, unspecified: Secondary | ICD-10-CM | POA: Diagnosis not present

## 2015-12-24 DIAGNOSIS — M793 Panniculitis, unspecified: Secondary | ICD-10-CM | POA: Diagnosis not present

## 2015-12-24 DIAGNOSIS — Z794 Long term (current) use of insulin: Secondary | ICD-10-CM | POA: Diagnosis not present

## 2015-12-24 DIAGNOSIS — B9562 Methicillin resistant Staphylococcus aureus infection as the cause of diseases classified elsewhere: Secondary | ICD-10-CM | POA: Diagnosis not present

## 2015-12-24 DIAGNOSIS — L0390 Cellulitis, unspecified: Secondary | ICD-10-CM | POA: Diagnosis not present

## 2015-12-24 DIAGNOSIS — N481 Balanitis: Secondary | ICD-10-CM

## 2015-12-24 DIAGNOSIS — E1165 Type 2 diabetes mellitus with hyperglycemia: Secondary | ICD-10-CM

## 2015-12-24 DIAGNOSIS — R7881 Bacteremia: Secondary | ICD-10-CM

## 2015-12-24 DIAGNOSIS — L0291 Cutaneous abscess, unspecified: Secondary | ICD-10-CM | POA: Diagnosis not present

## 2015-12-24 DIAGNOSIS — L03311 Cellulitis of abdominal wall: Secondary | ICD-10-CM

## 2015-12-24 HISTORY — DX: Balanitis: N48.1

## 2015-12-24 HISTORY — DX: Cellulitis, unspecified: L03.90

## 2015-12-24 HISTORY — DX: Bacteremia: R78.81

## 2015-12-24 HISTORY — DX: Cutaneous abscess, unspecified: L02.91

## 2015-12-24 MED ORDER — MUPIROCIN 2 % EX OINT: 1.0000 "application " | TOPICAL_OINTMENT | Freq: Two times a day (BID) | CUTANEOUS | 1 refills | Status: DC

## 2015-12-24 NOTE — Patient Instructions (Signed)
For next  2 months take one cup of bleach and put in a full tub of bathwater  SOAK as much of your body as possible for FIFTEEN MINUTES  REPEAT THREE TIMES WEEKLY FOR 2 MONTHS  Make sure you have eucerin or other moisturizer to apply to skin because the bleach will dry it out  Also apply an anitbacterial ointment : 2% MUPIROCIN TWICE DAILY inside of your nostrils for 7 days

## 2015-12-24 NOTE — Progress Notes (Signed)
Subjective:    Chief complaint: leg wound   Patient ID: Bradley Mcclure, male    DOB: 1960/06/14, 55 y.o.   MRN: [id number]  HPI  55 year old [name] with poorly controled DM with A1C >10 who claims he has recurrent inflammation of his penis and his pannus on yearly basis. This time he was admitted to [facility] with MRSA bacteremia. He had TEE which was negative and source of infection was thought to be panniculitis. He received 2 weeks of IV antibiotics.   Since then he succumbed to an abscess on his leg that required I and D in the ED.  Past Medical History:  Diagnosis Date  . Balanitis 12/24/2015  . Cellulitis and abscess 12/24/2015  . Hypertension   . IBS (irritable bowel syndrome)   . 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  . Pneumonia 2014-2015 X 1  . Type II diabetes mellitus (HCC)     Past Surgical History:  Procedure Laterality Date  . TEE WITHOUT CARDIOVERSION N/A 11/17/2015   Procedure: TRANSESOPHAGEAL ECHOCARDIOGRAM (TEE);  Surgeon: Chilton Si, MD;  Location: [facility] ENDOSCOPY;  Service: Cardiovascular;  Laterality: N/A;  . TONSILLECTOMY  ~ 1977    Family History  Problem Relation Age of Onset  . Myasthenia gravis Mother   . Diabetes Mellitus I Brother       Social History   Social History  . Marital status: Single    Spouse name: N/A  . Number of children: N/A  . Years of education: N/A   Social History Main Topics  . Smoking status: Never Smoker  . Smokeless tobacco: Never Used  . Alcohol use No  . Drug use: No  . Sexual activity: Yes   Other Topics Concern  . None   Social History Narrative  . None    No Known Allergies   Current Outpatient Prescriptions:  .  acetaminophen (TYLENOL) 325 MG tablet, Take 650 mg by mouth every 6 (six) hours as needed., Disp: , Rfl:  .  amoxicillin-clavulanate (AUGMENTIN) 875-125 MG tablet, Take 1 tablet by mouth every 12 (twelve) hours., Disp: 14 tablet, Rfl: 0 .  glucose blood test strip, 1 each by Other route as  needed for other. Use as instructed, Disp: , Rfl:  .  insulin glargine (LANTUS) 100 UNIT/ML injection, Inject 0.75 mLs (75 Units total) into the skin 2 (two) times daily. (Patient taking differently: Inject 90 Units into the skin 2 (two) times daily. ), Disp: , Rfl:  .  Liraglutide (VICTOZA) 18 MG/3ML SOPN, Inject 1.8 mg into the skin daily. , Disp: , Rfl:  .  losartan (COZAAR) 50 MG tablet, Take 50 mg by mouth daily., Disp: , Rfl:  .  metFORMIN (GLUCOPHAGE) 500 MG tablet, Take 1,000 mg by mouth 2 (two) times daily with a meal. , Disp: , Rfl:  .  pravastatin (PRAVACHOL) 40 MG tablet, Take 40 mg by mouth daily., Disp: , Rfl:  .  rivaroxaban (XARELTO) 20 MG TABS tablet, Take 20 mg by mouth daily with supper., Disp: , Rfl:  .  Skin Protectants, Misc. (INTERDRY AG TEXTILE 10"X144") SHEE, Apply 2 packets topically once a week., Disp: 10 each, Rfl: 1 .  mupirocin ointment (BACTROBAN) 2 %, Place 1 application into the nose 2 (two) times daily., Disp: 30 g, Rfl: 1    Review of Systems  Constitutional: Negative for chills, diaphoresis and fever.  HENT: Negative for congestion, hearing loss, sore throat and tinnitus.   Respiratory: Negative for cough, shortness  of breath and wheezing.   Cardiovascular: Negative for chest pain, palpitations and leg swelling.  Gastrointestinal: Negative for abdominal pain, blood in stool, constipation, diarrhea, nausea and vomiting.  Genitourinary: Negative for dysuria, flank pain and hematuria.  Musculoskeletal: Negative for back pain and myalgias.  Skin: Positive for wound. Negative for rash.  Neurological: Negative for dizziness, weakness and headaches.  Hematological: Does not bruise/bleed easily.  Psychiatric/Behavioral: Negative for suicidal ideas. The patient is not nervous/anxious.       Objective:   Physical Exam  Constitutional: He is oriented to person, place, and time. He appears well-developed and well-nourished. No distress.  HENT:  Head:  Normocephalic and atraumatic.  Mouth/Throat: No oropharyngeal exudate.  Eyes: Conjunctivae and EOM are normal. No scleral icterus.  Neck: Normal range of motion. Neck supple.  Cardiovascular: Normal rate and regular rhythm.   Pulmonary/Chest: Effort normal. No respiratory distress. He has no wheezes.  Abdominal: He exhibits no distension.  Genitourinary:    Circumcised. No phimosis.  Musculoskeletal: He exhibits no edema or tenderness.  Neurological: He is alert and oriented to person, place, and time. He exhibits normal muscle tone. Coordination normal.  Skin: Skin is warm and dry. No rash noted. He is not diaphoretic. No erythema. No pallor.  Psychiatric: He has a normal mood and affect. His behavior is normal. Thought content normal. His speech is delayed.   Skin: wound on right thigh 12/24/15:           Assessment & Plan:   MRSA bacteremia:   should have received adequate therapy  MRSA abscess: sp I and D and seems to be under control  DM: very poorly controlled. 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

## 2015-12-28 ENCOUNTER — Telehealth: Payer: Self-pay | Admitting: *Deleted

## 2015-12-28 ENCOUNTER — Other Ambulatory Visit: Payer: Self-pay | Admitting: *Deleted

## 2015-12-28 MED ORDER — CHLORHEXIDINE GLUCONATE 4 % EX LIQD: Freq: Every day | CUTANEOUS | 0 refills | Status: DC

## 2015-12-28 NOTE — Telephone Encounter (Signed)
If he cannot do this than he can do the following instead:  Purchase Hibiclens 4% solution and shower head to toe with this nightly for 1 week.  When he completes the shower with Hibiclens he should not apply deodorant are other topical creams or ointments for at least 1 hour after the showers completed  He can also apply intranasal mupirocin twice daily and his nostrils for that same week.  If the above approach does not help eradicate MRSA and he continues to have recurrent infectious symptoms consistent with MRSA infection such as boils then we may need to reconsider trying to do a bleach back and or doing decolonization of family members or other close contacts with whom he lives.

## 2015-12-28 NOTE — Telephone Encounter (Signed)
Patient called and advised he was told at his last visit to take bleach baths. He advised at the time it sounded easy but as he is over six feet tall and it is hard for him to get down that low and get back up. He can not fit his whole body in the bathtub. He asked if it was really necessary. Advised him will have to ask the doctor and give him a call back.

## 2015-12-30 NOTE — Telephone Encounter (Signed)
Patient notified Bradley Mcclure  

## 2016-02-28 ENCOUNTER — Emergency Department (HOSPITAL_COMMUNITY): Payer: Medicare Other

## 2016-02-28 ENCOUNTER — Encounter (HOSPITAL_COMMUNITY): Payer: Self-pay | Admitting: *Deleted

## 2016-02-28 ENCOUNTER — Inpatient Hospital Stay (HOSPITAL_COMMUNITY)
Admission: EM | Admit: 2016-02-28 | Discharge: 2016-03-01 | DRG: 872 | Disposition: A | Discharge status: Home / Self Care | Payer: Medicare Other | Attending: Internal Medicine | Admitting: Internal Medicine

## 2016-02-28 DIAGNOSIS — E119 Type 2 diabetes mellitus without complications: Secondary | ICD-10-CM | POA: Diagnosis present

## 2016-02-28 DIAGNOSIS — K589 Irritable bowel syndrome without diarrhea: Secondary | ICD-10-CM | POA: Diagnosis present

## 2016-02-28 DIAGNOSIS — Z86711 Personal history of pulmonary embolism: Secondary | ICD-10-CM | POA: Diagnosis not present

## 2016-02-28 DIAGNOSIS — Z833 Family history of diabetes mellitus: Secondary | ICD-10-CM | POA: Diagnosis not present

## 2016-02-28 DIAGNOSIS — A4101 Sepsis due to Methicillin susceptible Staphylococcus aureus: Secondary | ICD-10-CM | POA: Diagnosis present

## 2016-02-28 DIAGNOSIS — Z6839 Body mass index (BMI) 39.0-39.9, adult: Secondary | ICD-10-CM | POA: Diagnosis not present

## 2016-02-28 DIAGNOSIS — Z820 Family history of epilepsy and other diseases of the nervous system: Secondary | ICD-10-CM

## 2016-02-28 DIAGNOSIS — Z794 Long term (current) use of insulin: Secondary | ICD-10-CM

## 2016-02-28 DIAGNOSIS — A419 Sepsis, unspecified organism: Secondary | ICD-10-CM | POA: Diagnosis not present

## 2016-02-28 DIAGNOSIS — Z7901 Long term (current) use of anticoagulants: Secondary | ICD-10-CM

## 2016-02-28 DIAGNOSIS — L03116 Cellulitis of left lower limb: Secondary | ICD-10-CM | POA: Diagnosis present

## 2016-02-28 DIAGNOSIS — E785 Hyperlipidemia, unspecified: Secondary | ICD-10-CM | POA: Diagnosis present

## 2016-02-28 DIAGNOSIS — L0390 Cellulitis, unspecified: Secondary | ICD-10-CM | POA: Diagnosis present

## 2016-02-28 DIAGNOSIS — B9689 Other specified bacterial agents as the cause of diseases classified elsewhere: Secondary | ICD-10-CM | POA: Diagnosis not present

## 2016-02-28 DIAGNOSIS — I10 Essential (primary) hypertension: Secondary | ICD-10-CM | POA: Diagnosis present

## 2016-02-28 DIAGNOSIS — E1165 Type 2 diabetes mellitus with hyperglycemia: Secondary | ICD-10-CM

## 2016-02-28 DIAGNOSIS — B9562 Methicillin resistant Staphylococcus aureus infection as the cause of diseases classified elsewhere: Secondary | ICD-10-CM | POA: Diagnosis not present

## 2016-02-28 LAB — MRSA PCR SCREENING: MRSA by PCR: POSITIVE — AB

## 2016-02-28 LAB — CBC WITH DIFFERENTIAL/PLATELET
Basophils Absolute: 0 10*3/uL (ref 0.0–0.1)
Basophils Relative: 0 %
EOS ABS: 0 10*3/uL (ref 0.0–0.7)
Eosinophils Relative: 0 %
HEMATOCRIT: 43.9 % (ref 39.0–52.0)
HEMOGLOBIN: 15.3 g/dL (ref 13.0–17.0)
LYMPHS ABS: 1.3 10*3/uL (ref 0.7–4.0)
Lymphocytes Relative: 7 %
MCH: 32.1 pg (ref 26.0–34.0)
MCHC: 34.9 g/dL (ref 30.0–36.0)
MCV: 92.2 fL (ref 78.0–100.0)
MONOS PCT: 7 %
Monocytes Absolute: 1.4 10*3/uL — ABNORMAL HIGH (ref 0.1–1.0)
NEUTROS PCT: 86 %
Neutro Abs: 16.4 10*3/uL — ABNORMAL HIGH (ref 1.7–7.7)
Platelets: 228 10*3/uL (ref 150–400)
RBC: 4.76 MIL/uL (ref 4.22–5.81)
RDW: 13.3 % (ref 11.5–15.5)
WBC: 19.1 10*3/uL — ABNORMAL HIGH (ref 4.0–10.5)

## 2016-02-28 LAB — COMPREHENSIVE METABOLIC PANEL
ALK PHOS: 81 U/L (ref 38–126)
ALT: 48 U/L (ref 17–63)
ANION GAP: 11 (ref 5–15)
AST: 42 U/L — ABNORMAL HIGH (ref 15–41)
Albumin: 4 g/dL (ref 3.5–5.0)
BILIRUBIN TOTAL: 1.1 mg/dL (ref 0.3–1.2)
BUN: 11 mg/dL (ref 6–20)
CALCIUM: 9.6 mg/dL (ref 8.9–10.3)
CO2: 23 mmol/L (ref 22–32)
Chloride: 100 mmol/L — ABNORMAL LOW (ref 101–111)
Creatinine, Ser: 0.73 mg/dL (ref 0.61–1.24)
Glucose, Bld: 330 mg/dL — ABNORMAL HIGH (ref 65–99)
Potassium: 4.6 mmol/L (ref 3.5–5.1)
SODIUM: 134 mmol/L — AB (ref 135–145)
TOTAL PROTEIN: 7.7 g/dL (ref 6.5–8.1)

## 2016-02-28 LAB — GLUCOSE, CAPILLARY
GLUCOSE-CAPILLARY: 268 mg/dL — AB (ref 65–99)
GLUCOSE-CAPILLARY: 330 mg/dL — AB (ref 65–99)
Glucose-Capillary: 351 mg/dL — ABNORMAL HIGH (ref 65–99)

## 2016-02-28 LAB — I-STAT CHEM 8, ED
BUN: 14 mg/dL (ref 6–20)
CALCIUM ION: 1.15 mmol/L (ref 1.15–1.40)
Chloride: 99 mmol/L — ABNORMAL LOW (ref 101–111)
Creatinine, Ser: 0.6 mg/dL — ABNORMAL LOW (ref 0.61–1.24)
GLUCOSE: 317 mg/dL — AB (ref 65–99)
HCT: 48 % (ref 39.0–52.0)
Hemoglobin: 16.3 g/dL (ref 13.0–17.0)
Potassium: 4.6 mmol/L (ref 3.5–5.1)
SODIUM: 136 mmol/L (ref 135–145)
TCO2: 26 mmol/L (ref 0–100)

## 2016-02-28 LAB — I-STAT CG4 LACTIC ACID, ED
LACTIC ACID, VENOUS: 2.81 mmol/L — AB (ref 0.5–1.9)
LACTIC ACID, VENOUS: 1.76 mmol/L (ref 0.5–1.9)

## 2016-02-28 MED ORDER — PRAVASTATIN SODIUM 40 MG PO TABS
40.0000 mg | ORAL_TABLET | Freq: Every day | ORAL | Status: DC
  Administered 2016-02-29 – 2016-03-01 (×2): 40 mg via ORAL
  Filled 2016-02-28 (×2): qty 1

## 2016-02-28 MED ORDER — HYDROMORPHONE HCL 2 MG/ML IJ SOLN
1.0000 mg | Freq: Once | INTRAMUSCULAR | Status: AC
  Administered 2016-02-28: 1 mg via INTRAVENOUS
  Filled 2016-02-28: qty 1

## 2016-02-28 MED ORDER — SENNA 8.6 MG PO TABS
1.0000 | ORAL_TABLET | Freq: Two times a day (BID) | ORAL | Status: DC
  Administered 2016-02-28 – 2016-03-01 (×4): 8.6 mg via ORAL
  Filled 2016-02-28 (×4): qty 1

## 2016-02-28 MED ORDER — RIVAROXABAN 20 MG PO TABS
20.0000 mg | ORAL_TABLET | Freq: Every day | ORAL | Status: DC
  Administered 2016-02-28 – 2016-02-29 (×2): 20 mg via ORAL
  Filled 2016-02-28 (×2): qty 1

## 2016-02-28 MED ORDER — INSULIN ASPART 100 UNIT/ML ~~LOC~~ SOLN
0.0000 [IU] | SUBCUTANEOUS | Status: DC
  Administered 2016-02-28: 11 [IU] via SUBCUTANEOUS
  Administered 2016-02-28: 20 [IU] via SUBCUTANEOUS
  Administered 2016-02-28: 15 [IU] via SUBCUTANEOUS
  Administered 2016-02-29 (×3): 11 [IU] via SUBCUTANEOUS
  Administered 2016-02-29: 15 [IU] via SUBCUTANEOUS
  Administered 2016-03-01 (×4): 7 [IU] via SUBCUTANEOUS

## 2016-02-28 MED ORDER — DOCUSATE SODIUM 100 MG PO CAPS
100.0000 mg | ORAL_CAPSULE | Freq: Two times a day (BID) | ORAL | Status: DC
  Administered 2016-02-28 – 2016-03-01 (×4): 100 mg via ORAL
  Filled 2016-02-28 (×4): qty 1

## 2016-02-28 MED ORDER — SODIUM CHLORIDE 0.9 % IV SOLN: INTRAVENOUS | Status: AC

## 2016-02-28 MED ORDER — LOSARTAN POTASSIUM 50 MG PO TABS
50.0000 mg | ORAL_TABLET | Freq: Every day | ORAL | Status: DC
  Administered 2016-02-29 – 2016-03-01 (×2): 50 mg via ORAL
  Filled 2016-02-28 (×2): qty 1

## 2016-02-28 MED ORDER — VANCOMYCIN HCL 10 G IV SOLR
1250.0000 mg | Freq: Two times a day (BID) | INTRAVENOUS | Status: DC
  Administered 2016-02-29 – 2016-03-01 (×3): 1250 mg via INTRAVENOUS
  Filled 2016-02-28 (×4): qty 1250

## 2016-02-28 MED ORDER — ONDANSETRON HCL 4 MG/2ML IJ SOLN
4.0000 mg | Freq: Once | INTRAMUSCULAR | Status: AC
Start: 2016-02-28 — End: 2016-02-28
  Administered 2016-02-28: 4 mg via INTRAVENOUS
  Filled 2016-02-28: qty 2

## 2016-02-28 MED ORDER — ACETAMINOPHEN 650 MG RE SUPP: 650.0000 mg | Freq: Four times a day (QID) | RECTAL | Status: DC | PRN

## 2016-02-28 MED ORDER — HYDROCODONE-ACETAMINOPHEN 5-325 MG PO TABS
1.0000 | ORAL_TABLET | ORAL | Status: DC | PRN
  Administered 2016-02-28 – 2016-02-29 (×4): 2 via ORAL
  Filled 2016-02-28 (×4): qty 2

## 2016-02-28 MED ORDER — SODIUM CHLORIDE 0.9 % IV SOLN
INTRAVENOUS | Status: DC
Start: 2016-02-28 — End: 2016-02-29

## 2016-02-28 MED ORDER — ACETAMINOPHEN 325 MG PO TABS
650.0000 mg | ORAL_TABLET | Freq: Four times a day (QID) | ORAL | Status: DC | PRN
Start: 2016-02-28 — End: 2016-03-01
  Administered 2016-02-29: 650 mg via ORAL
  Filled 2016-02-28: qty 2

## 2016-02-28 MED ORDER — ONDANSETRON HCL 4 MG/2ML IJ SOLN: 4.0000 mg | Freq: Four times a day (QID) | INTRAMUSCULAR | Status: DC | PRN

## 2016-02-28 MED ORDER — ONDANSETRON HCL 4 MG PO TABS: 4.0000 mg | ORAL_TABLET | Freq: Four times a day (QID) | ORAL | Status: DC | PRN

## 2016-02-28 MED ORDER — VANCOMYCIN HCL 10 G IV SOLR
2000.0000 mg | Freq: Once | INTRAVENOUS | Status: AC
  Administered 2016-02-28: 2000 mg via INTRAVENOUS
  Filled 2016-02-28: qty 2000

## 2016-02-28 MED ORDER — INSULIN GLARGINE 100 UNIT/ML ~~LOC~~ SOLN
50.0000 [IU] | Freq: Every day | SUBCUTANEOUS | Status: DC
  Administered 2016-02-28: 50 [IU] via SUBCUTANEOUS
  Filled 2016-02-28: qty 0.5

## 2016-02-28 MED ORDER — VANCOMYCIN HCL IN DEXTROSE 1-5 GM/200ML-% IV SOLN
1000.0000 mg | Freq: Once | INTRAVENOUS | Status: AC
  Administered 2016-02-28: 1000 mg via INTRAVENOUS
  Filled 2016-02-28: qty 200

## 2016-02-28 MED ORDER — SODIUM CHLORIDE 0.9 % IV SOLN
INTRAVENOUS | Status: AC
  Administered 2016-02-28: 17:00:00 via INTRAVENOUS

## 2016-02-28 MED ORDER — SODIUM CHLORIDE 0.9 % IV BOLUS (SEPSIS)
1000.0000 mL | Freq: Once | INTRAVENOUS | Status: AC
  Administered 2016-02-28: 1000 mL via INTRAVENOUS

## 2016-02-28 MED ORDER — HYDRALAZINE HCL 20 MG/ML IJ SOLN
5.0000 mg | INTRAMUSCULAR | Status: DC | PRN
  Administered 2016-02-28 – 2016-02-29 (×2): 5 mg via INTRAVENOUS
  Filled 2016-02-28 (×2): qty 1
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
## 2016-02-28 NOTE — ED Notes (Signed)
Patient transported to X-ray 

## 2016-02-28 NOTE — Progress Notes (Signed)
Pharmacy Antibiotic Note  Bradley Mcclure is a 55 y.o. male admitted on 02/28/2016 with cellulitis.  Pharmacy has been consulted for vancomycin dosing.  Plan: Vancomycin 2000mg  now then 1250mg  IV every 12 hours.  Goal trough 10-15 mcg/mL.  Height: 6\' 1"  (185.4 cm) Weight: 300 lb (136.1 kg) IBW/kg (Calculated) : 79.9  Temp (24hrs), Avg:98.9 F (37.2 C), Min:98.4 F (36.9 C), Max:99.8 F (37.7 C)   Recent Labs Lab 02/28/16 1220 02/28/16 1243 02/28/16 1244 02/28/16 1610  WBC 19.1*  --   --   --   CREATININE 0.73 0.60*  --   --   LATICACIDVEN  --   --  2.81* 1.76    Estimated Creatinine Clearance: 154.7 mL/min (by C-G formula based on SCr of 0.6 mg/dL (L)).    No Known Allergies  Antimicrobials this admission: Vanc 11/12>>  Dose adjustments this admission:  Microbiology results:  BCx:   UCx:   Sputum:   MRSA PCR:   Thank you for allowing pharmacy to be a part of this patient's care.  Sheppard CoilFrank Wilson PharmD., BCPS Clinical Pharmacist Pager [phone] 02/28/2016 8:22 PM

## 2016-02-28 NOTE — ED Notes (Signed)
Pt 84-86% on RA. Pt placed on 2L [place], SpO2 improved to 94%

## 2016-02-28 NOTE — ED Notes (Signed)
EDP aware pt hr 130 and pt wound to left leg w/ yellow drainage and redness surrounding wound. No new orders at this time.
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
## 2016-02-29 DIAGNOSIS — Z7901 Long term (current) use of anticoagulants: Secondary | ICD-10-CM

## 2016-02-29 DIAGNOSIS — Z79899 Other long term (current) drug therapy: Secondary | ICD-10-CM

## 2016-02-29 DIAGNOSIS — Z86711 Personal history of pulmonary embolism: Secondary | ICD-10-CM

## 2016-02-29 DIAGNOSIS — Z8614 Personal history of Methicillin resistant Staphylococcus aureus infection: Secondary | ICD-10-CM

## 2016-02-29 DIAGNOSIS — I10 Essential (primary) hypertension: Secondary | ICD-10-CM

## 2016-02-29 DIAGNOSIS — Z794 Long term (current) use of insulin: Secondary | ICD-10-CM

## 2016-02-29 DIAGNOSIS — A419 Sepsis, unspecified organism: Secondary | ICD-10-CM

## 2016-02-29 DIAGNOSIS — E1165 Type 2 diabetes mellitus with hyperglycemia: Secondary | ICD-10-CM

## 2016-02-29 DIAGNOSIS — L03116 Cellulitis of left lower limb: Secondary | ICD-10-CM

## 2016-02-29 DIAGNOSIS — E785 Hyperlipidemia, unspecified: Secondary | ICD-10-CM

## 2016-02-29 DIAGNOSIS — B9689 Other specified bacterial agents as the cause of diseases classified elsewhere: Secondary | ICD-10-CM

## 2016-02-29 DIAGNOSIS — Z833 Family history of diabetes mellitus: Secondary | ICD-10-CM

## 2016-02-29 DIAGNOSIS — Z872 Personal history of diseases of the skin and subcutaneous tissue: Secondary | ICD-10-CM

## 2016-02-29 DIAGNOSIS — Z8349 Family history of other endocrine, nutritional and metabolic diseases: Secondary | ICD-10-CM

## 2016-02-29 DIAGNOSIS — Z6839 Body mass index (BMI) 39.0-39.9, adult: Secondary | ICD-10-CM

## 2016-02-29 LAB — CBC
HCT: 37.9 % — ABNORMAL LOW (ref 39.0–52.0)
HEMOGLOBIN: 12.6 g/dL — AB (ref 13.0–17.0)
MCH: 31.2 pg (ref 26.0–34.0)
MCHC: 33.2 g/dL (ref 30.0–36.0)
MCV: 93.8 fL (ref 78.0–100.0)
PLATELETS: 192 10*3/uL (ref 150–400)
RBC: 4.04 MIL/uL — AB (ref 4.22–5.81)
RDW: 13.7 % (ref 11.5–15.5)
WBC: 17.4 10*3/uL — AB (ref 4.0–10.5)

## 2016-02-29 LAB — GLUCOSE, CAPILLARY
GLUCOSE-CAPILLARY: 275 mg/dL — AB (ref 65–99)
GLUCOSE-CAPILLARY: 289 mg/dL — AB (ref 65–99)
Glucose-Capillary: 284 mg/dL — ABNORMAL HIGH (ref 65–99)
Glucose-Capillary: 301 mg/dL — ABNORMAL HIGH (ref 65–99)
Glucose-Capillary: 279 mg/dL — ABNORMAL HIGH (ref 65–99)

## 2016-02-29 LAB — BASIC METABOLIC PANEL
ANION GAP: 9 (ref 5–15)
BUN: 10 mg/dL (ref 6–20)
CHLORIDE: 97 mmol/L — AB (ref 101–111)
CO2: 25 mmol/L (ref 22–32)
Calcium: 9 mg/dL (ref 8.9–10.3)
Creatinine, Ser: 0.74 mg/dL (ref 0.61–1.24)
Glucose, Bld: 294 mg/dL — ABNORMAL HIGH (ref 65–99)
POTASSIUM: 4.3 mmol/L (ref 3.5–5.1)
SODIUM: 131 mmol/L — AB (ref 135–145)

## 2016-02-29 MED ORDER — SILVER SULFADIAZINE 1 % EX CREA
TOPICAL_CREAM | Freq: Every day | CUTANEOUS | Status: DC
  Administered 2016-02-29 – 2016-03-01 (×2): via TOPICAL
  Filled 2016-02-29: qty 85

## 2016-02-29 MED ORDER — INSULIN GLARGINE 100 UNIT/ML ~~LOC~~ SOLN
70.0000 [IU] | Freq: Every day | SUBCUTANEOUS | Status: DC
  Administered 2016-02-29: 70 [IU] via SUBCUTANEOUS
  Filled 2016-02-29: qty 0.7

## 2016-02-29 MED ORDER — HYDROCODONE-ACETAMINOPHEN 5-325 MG PO TABS
1.0000 | ORAL_TABLET | Freq: Four times a day (QID) | ORAL | Status: DC | PRN
  Administered 2016-02-29 – 2016-03-01 (×5): 2 via ORAL
  Filled 2016-02-29 (×5): qty 2
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
## 2016-02-29 NOTE — Progress Notes (Signed)
Inpatient Diabetes Program Recommendations  AACE/ADA: New Consensus Statement on Inpatient Glycemic Control (2015)  Target Ranges:  Prepandial:   less than 140 mg/dL      Peak postprandial:   less than 180 mg/dL (1-2 hours)      Critically ill patients:  140 - 180 mg/dL   Results for Bradley Mcclure, Bradley Mcclure (MRN [id number]) as of 02/29/2016 08:52  Ref. Range 02/28/2016 18:07 02/28/2016 20:35 02/28/2016 23:34 02/29/2016 05:16 02/29/2016 08:36  Glucose-Capillary Latest Ref Range: 65 - 99 mg/dL 409268 (H) 811351 (H) 914330 (H) 284 (H) 275 (H)   Review of Glycemic Control  Diabetes history: DM2 Outpatient Diabetes medications: Lantus 90 units BID, Victoza 1.8 mg daily, Metformin 1000 mg BID Current orders for Inpatient glycemic control: Lantus 70 units QHS, Novolog 0-20 units Q4H  Inpatient Diabetes Program Recommendations: Insulin - Basal: Noted Lantus was increased to 70 units QHS this morning.  Insulin - Meal Coverage: If patient is eating at least 50% of meals, please consider ordering Novolog 10 units TID with meals for meal coverage (in addition to Novolog correction scale).  Thanks, Orlando PennerMarie Frankee Gritz, RN, MSN, CDE Diabetes Coordinator Inpatient Diabetes Program [phone] (Team Pager from 8am to 5pm)

## 2016-02-29 NOTE — Progress Notes (Signed)
Patient's blood glucose elevated to 275 after 11 units of insulin at 0516. Physician notified. Will recheck in one hour, nurse will continue to monitor Becci Batty A Edwina Grossberg, RN
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
## 2016-02-29 NOTE — Progress Notes (Addendum)
Received reports that patient was refusing to cooperate with nursing staff to receive medications, being verbally abusive and inappropriate with nursing staff. Had candid conversation with the patient about the need to cooperate with staff, who are working hard to help him heal from his infection, as well as the unacceptable nature of his current behavior. I made it clear that he is not being held here against his will, and that he has the option to leave against medical advice, but while he is here he needs to be reasonably polite and cooperative with our treatment efforts. He voiced understanding and willingness to adjust his behavior.

## 2016-02-29 NOTE — Progress Notes (Signed)
Nurse went in to give patient his medication. Patient is using very inappropriate language, cussing, and not wanting to state his proper name and date of birth. Patient states that his name is Bradley Mcclure or Bradley Mcclure with different birth dates with each encounter. Nurse paged MD, waiting for response Ramin Zoll A Kaleya Douse, RN

## 2016-03-01 DIAGNOSIS — B9562 Methicillin resistant Staphylococcus aureus infection as the cause of diseases classified elsewhere: Secondary | ICD-10-CM

## 2016-03-01 DIAGNOSIS — A4101 Sepsis due to Methicillin susceptible Staphylococcus aureus: Secondary | ICD-10-CM | POA: Diagnosis present

## 2016-03-01 LAB — CBC
HCT: 38.3 % — ABNORMAL LOW (ref 39.0–52.0)
Hemoglobin: 12.7 g/dL — ABNORMAL LOW (ref 13.0–17.0)
MCH: 31 pg (ref 26.0–34.0)
MCHC: 33.2 g/dL (ref 30.0–36.0)
MCV: 93.4 fL (ref 78.0–100.0)
PLATELETS: 206 10*3/uL (ref 150–400)
RBC: 4.1 MIL/uL — AB (ref 4.22–5.81)
RDW: 13.6 % (ref 11.5–15.5)
WBC: 16.2 10*3/uL — ABNORMAL HIGH (ref 4.0–10.5)

## 2016-03-01 LAB — GLUCOSE, CAPILLARY
GLUCOSE-CAPILLARY: 232 mg/dL — AB (ref 65–99)
Glucose-Capillary: 203 mg/dL — ABNORMAL HIGH (ref 65–99)
Glucose-Capillary: 230 mg/dL — ABNORMAL HIGH (ref 65–99)
Glucose-Capillary: 243 mg/dL — ABNORMAL HIGH (ref 65–99)

## 2016-03-01 MED ORDER — POLYETHYLENE GLYCOL 3350 17 G PO PACK
17.0000 g | PACK | Freq: Every day | ORAL | Status: DC
  Administered 2016-03-01: 17 g via ORAL
  Filled 2016-03-01: qty 1

## 2016-03-01 MED ORDER — SILVER SULFADIAZINE 1 % EX CREA: TOPICAL_CREAM | Freq: Every day | CUTANEOUS | 0 refills | Status: DC

## 2016-03-01 MED ORDER — SULFAMETHOXAZOLE-TRIMETHOPRIM 800-160 MG PO TABS
2.0000 | ORAL_TABLET | Freq: Two times a day (BID) | ORAL | Status: DC
  Filled 2016-03-01: qty 2

## 2016-03-01 MED ORDER — SULFAMETHOXAZOLE-TRIMETHOPRIM 800-160 MG PO TABS: 2.0000 | ORAL_TABLET | Freq: Two times a day (BID) | ORAL | Status: DC

## 2016-03-01 MED ORDER — SENNA 8.6 MG PO TABS: 2.0000 | ORAL_TABLET | Freq: Two times a day (BID) | ORAL | Status: DC

## 2016-03-01 MED ORDER — SENNA 8.6 MG PO TABS: 2.0000 | ORAL_TABLET | Freq: Two times a day (BID) | ORAL | 0 refills | Status: DC

## 2016-03-01 MED ORDER — HYDROCODONE-ACETAMINOPHEN 5-325 MG PO TABS: 1.0000 | ORAL_TABLET | Freq: Four times a day (QID) | ORAL | 0 refills | Status: DC | PRN

## 2016-03-01 MED ORDER — MUPIROCIN 2 % EX OINT
1.0000 "application " | TOPICAL_OINTMENT | Freq: Two times a day (BID) | CUTANEOUS | Status: DC
  Administered 2016-03-01: 1 via NASAL
  Filled 2016-03-01: qty 22

## 2016-03-01 MED ORDER — SULFAMETHOXAZOLE-TRIMETHOPRIM 800-160 MG PO TABS: 2.0000 | ORAL_TABLET | Freq: Two times a day (BID) | ORAL | 0 refills | Status: DC

## 2016-03-01 MED ORDER — POLYETHYLENE GLYCOL 3350 17 G PO PACK: 17.0000 g | PACK | Freq: Every day | ORAL | 0 refills | Status: DC

## 2016-03-01 MED ORDER — CHLORHEXIDINE GLUCONATE CLOTH 2 % EX PADS: 6.0000 | MEDICATED_PAD | Freq: Every day | CUTANEOUS | Status: DC

## 2016-03-01 MED ORDER — INSULIN GLARGINE 100 UNIT/ML ~~LOC~~ SOLN
80.0000 [IU] | Freq: Every day | SUBCUTANEOUS | Status: DC
  Filled 2016-03-01: qty 0.8

## 2016-03-01 NOTE — Progress Notes (Signed)
Inpatient Diabetes Program Recommendations  AACE/ADA: New Consensus Statement on Inpatient Glycemic Control (2015)  Target Ranges:  Prepandial:   less than 140 mg/dL      Peak postprandial:   less than 180 mg/dL (1-2 hours)      Critically ill patients:  140 - 180 mg/dL   Results for Ernestina PatchesDONAVANT, Ulrich M (MRN [id number]) as of 03/01/2016 07:38  Ref. Range 02/29/2016 05:16 02/29/2016 08:36 02/29/2016 13:01 02/29/2016 16:29 02/29/2016 19:48 03/01/2016 00:38 03/01/2016 04:47  Glucose-Capillary Latest Ref Range: 65 - 99 mg/dL 409284 (H)  Novolog 11 units 275 (H)   301 (H)  Novolog 15 units 279 (H)  Novolog 11 units 289 (H)  Novolog 11 units  Lantus 70 units 243 (H)  Novolog 7 units 203 (H)  Novolog 7 units   Review of Glycemic Control  Diabetes history: DM2 Outpatient Diabetes medications: Lantus 90 units BID, Victoza 1.8 mg daily, Metformin 1000 mg BID Current orders for Inpatient glycemic control: Lantus 80 units QHS, Novolog 0-20 units Q4H  Inpatient Diabetes Program Recommendations: Insulin - Basal: Noted Lantus was increased to 80 units QHS this morning. Please consider increasing Lantus further to 90 units QHS. Insulin - Meal Coverage: If patient is eating at least 50% of meals, please consider ordering Novolog 10 units TID with meals for meal coverage (in addition to Novolog correction scale).  Thanks, Orlando PennerMarie Milla Wahlberg, RN, MSN, CDE Diabetes Coordinator Inpatient Diabetes Program [phone] (Team Pager from 8am to 5pm)
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
## 2016-03-01 NOTE — Progress Notes (Signed)
   Subjective: [name] reports that his leg pain continues to improve. His behavior has improved and he is cooperating with medications and dressing changes. He reports constipation with no BM since Saturday. He is interested in going home.  Objective: Vital signs in last 24 hours: Vitals:   02/28/16 2036 02/29/16 0520 02/29/16 1952 03/01/16 0452  BP: (!) 111/49 (!) 131/53 (!) 132/56 122/62  Pulse: (!) 131 (!) 124 (!) 118 (!) 112  Resp: 20 18 16 17   Temp: 98.6 F (37 C) 98.5 F (36.9 C) 99.5 F (37.5 C) 99.4 F (37.4 C)  TempSrc: Oral Oral Oral Oral  SpO2: 96% 96% 93% 95%  Weight:    (!) 137 kg (302 lb 1.6 oz)  Height:        Intake/Output Summary (Last 24 hours) at 03/01/16 [id number] Last data filed at 02/29/16 1952  Gross per 24 hour  Intake                0 ml  Output             1075 ml  Net            -1075 ml    Physical Exam General appearance: Obese, disheveled, consuming large breakfast  HENT: Normocephalic, atraumatic  Cardiovascular: Regular rate and rhythm, no murmurs, rubs, gallops Respiratory/Chest: Clear to ausculation bilaterally, normal work of breathing Abdomen: Obese, bowel sounds present, soft, non-tender, non-distended Skin: Warm, dry, bandaged lower extremity cellulitis continues to drain some clearyellow fluid, surrounding erythema retracting from marked border Psych: Guarded affect  Labs / Imaging / Procedures: CBC Latest Ref Rng & Units 03/01/2016 02/29/2016 02/28/2016  WBC 4.0 - 10.5 K/uL 16.2(H) 17.4(H) -  Hemoglobin 13.0 - 17.0 g/dL 12.7(L) 12.6(L) 16.3  Hematocrit 39.0 - 52.0 % 38.3(L) 37.9(L) 48.0  Platelets 150 - 400 K/uL 206 192 -   BMP Latest Ref Rng & Units 02/29/2016 02/28/2016 02/28/2016  Glucose 65 - 99 mg/dL 119(J294(H) 478(G317(H) 956(O330(H)  BUN 6 - 20 mg/dL 10 14 11   Creatinine 0.61 - 1.24 mg/dL 1.300.74 8.65(H0.60(L) 8.460.73  Sodium 135 - 145 mmol/L 131(L) 136 134(L)  Potassium 3.5 - 5.1 mmol/L 4.3 4.6 4.6  Chloride 101 - 111 mmol/L 97(L) 99(L)  100(L)  CO2 22 - 32 mmol/L 25 - 23  Calcium 8.9 - 10.3 mg/dL 9.0 - 9.6   No results found.  Assessment/Plan: 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   LOS: 2 days   Althia FortsAdam Taetum Flewellen, MD 03/01/2016, 6:52 AM Pager: [phone]

## 2016-03-01 NOTE — Progress Notes (Signed)
Discharge orders and prescriptions reviewed with patient. IV removed. Patient states understanding. Patient called [employer] for transportation [employer] Marijo Quizon, RCharity fundraiser

## 2016-03-01 NOTE — Progress Notes (Signed)
Internal Medicine Attending:   I saw and examined the patient. I reviewed the resident's note and I agree with the resident's findings and plan as documented in the resident's note. Erythema is greatly improved on exam today.  Blood cultures are no growth to date.  His wound culture has moderate Staph Aureus.  Based on previous culture results from July I would not recommend Doxycyline but rather Bactrim for transition to oral.  He appears otherwise stable for discharge on Bactrim.  We discussed with him the importance of diabetic control.  I agree with the diabetes coordinator to add mealtime coverage here if he remains here overnight.  [facility], I encourage him to discuss prandial insulin with his PCP as the may be needed to assist in controlling his diabetes.

## 2016-03-02 LAB — AEROBIC CULTURE W GRAM STAIN (SUPERFICIAL SPECIMEN)

## 2016-03-02 LAB — AEROBIC CULTURE  (SUPERFICIAL SPECIMEN)

## 2016-03-04 LAB — CULTURE, BLOOD (ROUTINE X 2)
CULTURE: NO GROWTH
Culture: NO GROWTH

## 2016-04-25 ENCOUNTER — Ambulatory Visit (INDEPENDENT_AMBULATORY_CARE_PROVIDER_SITE_OTHER): Payer: Non-veteran care | Admitting: Infectious Disease

## 2016-04-25 ENCOUNTER — Encounter: Payer: Self-pay | Admitting: Infectious Disease

## 2016-04-25 VITALS — BP 138/83 | HR 119 | Temp 98.6°F | Wt 299.0 lb

## 2016-04-25 DIAGNOSIS — M793 Panniculitis, unspecified: Secondary | ICD-10-CM

## 2016-04-25 DIAGNOSIS — R7881 Bacteremia: Secondary | ICD-10-CM

## 2016-04-25 DIAGNOSIS — A4101 Sepsis due to Methicillin susceptible Staphylococcus aureus: Secondary | ICD-10-CM | POA: Diagnosis not present

## 2016-04-25 DIAGNOSIS — B9562 Methicillin resistant Staphylococcus aureus infection as the cause of diseases classified elsewhere: Secondary | ICD-10-CM

## 2016-04-25 DIAGNOSIS — L0293 Carbuncle, unspecified: Secondary | ICD-10-CM

## 2016-04-25 DIAGNOSIS — L0292 Furuncle, unspecified: Secondary | ICD-10-CM

## 2016-04-25 DIAGNOSIS — B379 Candidiasis, unspecified: Secondary | ICD-10-CM

## 2016-04-25 NOTE — Patient Instructions (Signed)
ONCE your wound has healed up  TRY to see if HIBICLENS 2%  Showers nightly x 7 days  And   INtranasal mupirocin TWICE daily for 7 days will reduce frequency of MRSA outbreaks

## 2016-04-25 NOTE — Progress Notes (Signed)
Subjective:    Chief complaint: New left-sided MRSA infection   Patient ID: Bradley Mcclure, male    DOB: 1961/01/25, 56 y.o.   MRN: [id number]  HPI  56 year old Bradley Mcclure with poorly controled DM with A1C >10 who claims he has recurrent inflammation of his penis and his pannus on yearly basis. This time he was admitted to [facility] with MRSA bacteremia. He had TEE which was negative and source of infection was thought to be panniculitis. He received 2 weeks of IV antibiotics.   Since then he succumbed to an abscess on his leg that required I and D in the ED.  When I last saw him we tried to do Hibiclens baths in the mupirocin intranasally. We also tried to do bleach baths but he apparently would not fit into the bathtub due to his width.  He now has developed another abscess in his left pannus for which she was treated with oral Bactrim at the [facility].    Past Medical History:  Diagnosis Date  . Balanitis 12/24/2015  . Cellulitis and abscess 12/24/2015  . Hypertension   . IBS (irritable bowel syndrome)   . 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  . Pneumonia 2014-2015 X 1  . Type II diabetes mellitus (HCC)     Past Surgical History:  Procedure Laterality Date  . TEE WITHOUT CARDIOVERSION N/A 11/17/2015   Procedure: TRANSESOPHAGEAL ECHOCARDIOGRAM (TEE);  Surgeon: Chilton Si, MD;  Location: [facility] ENDOSCOPY;  Service: Cardiovascular;  Laterality: N/A;  . TONSILLECTOMY  ~ 1977    Family History  Problem Relation Age of Onset  . Myasthenia gravis Mother   . Diabetes Mellitus I Brother       Social History   Social History  . Marital status: Single    Spouse name: N/A  . Number of children: N/A  . Years of education: N/A   Social History Main Topics  . Smoking status: Never Smoker  . Smokeless tobacco: Never Used  . Alcohol use No  . Drug use: No  . Sexual activity: Yes   Other Topics Concern  . None   Social History Narrative  . None    No Known  Allergies   Current Outpatient Prescriptions:  .  acetaminophen (TYLENOL) 325 MG tablet, Take 650 mg by mouth every 6 (six) hours as needed for mild pain. , Disp: , Rfl:  .  Cholecalciferol (VITAMIN D-1000 MAX ST) 1000 units tablet, Take 2,000 Units by mouth daily., Disp: , Rfl:  .  glucose blood test strip, 1 each by Other route as needed for other. Use as instructed, Disp: , Rfl:  .  HYDROcodone-acetaminophen (NORCO/VICODIN) 5-325 MG tablet, Take 1-2 tablets by mouth every 6 (six) hours as needed for moderate pain., Disp: 12 tablet, Rfl: 0 .  insulin glargine (LANTUS) 100 UNIT/ML injection, Inject 0.75 mLs (75 Units total) into the skin 2 (two) times daily. (Patient taking differently: Inject 90 Units into the skin 2 (two) times daily. ), Disp: , Rfl:  .  Liraglutide (VICTOZA) 18 MG/3ML SOPN, Inject 1.8 mg into the skin daily. , Disp: , Rfl:  .  losartan (COZAAR) 50 MG tablet, Take 50 mg by mouth daily., Disp: , Rfl:  .  metFORMIN (GLUCOPHAGE) 500 MG tablet, Take 1,000 mg by mouth 2 (two) times daily with a meal. , Disp: , Rfl:  .  Multiple Vitamin (MULTIVITAMIN) tablet, Take 1 tablet by mouth daily., Disp: , Rfl:  .  mupirocin ointment (  BACTROBAN) 2 %, Place 1 application into the nose 2 (two) times daily., Disp: 30 g, Rfl: 1 .  polyethylene glycol (MIRALAX / GLYCOLAX) packet, Take 17 g by mouth daily., Disp: 14 each, Rfl: 0 .  pravastatin (PRAVACHOL) 40 MG tablet, Take 40 mg by mouth daily., Disp: , Rfl:  .  rivaroxaban (XARELTO) 20 MG TABS tablet, Take 20 mg by mouth daily with supper., Disp: , Rfl:  .  senna (SENOKOT) 8.6 MG TABS tablet, Take 2 tablets (17.2 mg total) by mouth 2 (two) times daily., Disp: 120 each, Rfl: 0 .  silver sulfADIAZINE (SILVADENE) 1 % cream, Apply topically daily., Disp: 50 g, Rfl: 0 .  Skin Protectants, Misc. (INTERDRY AG TEXTILE 10"X144") SHEE, Apply 2 packets topically once a week., Disp: 10 each, Rfl: 1 .  sulfamethoxazole-trimethoprim (BACTRIM DS,SEPTRA DS)  800-160 MG tablet, Take 2 tablets by mouth every 12 (twelve) hours., Disp: 40 tablet, Rfl: 0    Review of Systems  Constitutional: Negative for chills, diaphoresis and fever.  HENT: Negative for congestion, hearing loss, sore throat and tinnitus.   Respiratory: Negative for cough, shortness of breath and wheezing.   Cardiovascular: Negative for chest pain, palpitations and leg swelling.  Gastrointestinal: Negative for abdominal pain, blood in stool, constipation, diarrhea, nausea and vomiting.  Genitourinary: Negative for dysuria, flank pain and hematuria.  Musculoskeletal: Negative for back pain and myalgias.  Skin: Positive for color change and wound. Negative for rash.  Neurological: Negative for dizziness, weakness and headaches.  Hematological: Does not bruise/bleed easily.  Psychiatric/Behavioral: Negative for suicidal ideas. The patient is not nervous/anxious.    Left pannus 04/25/16:       Objective:   Physical Exam  Constitutional: He is oriented to person, place, and time. He appears well-developed and well-nourished. No distress.  HENT:  Head: Normocephalic and atraumatic.  Mouth/Throat: No oropharyngeal exudate.  Eyes: Conjunctivae and EOM are normal. No scleral icterus.  Neck: Normal range of motion. Neck supple.  Cardiovascular: Normal rate and regular rhythm.   Pulmonary/Chest: Effort normal. No respiratory distress. He has no wheezes.  Abdominal: He exhibits no distension.  Genitourinary: Circumcised. No phimosis.  Musculoskeletal: He exhibits no edema or tenderness.  Neurological: He is alert and oriented to person, place, and time. He exhibits normal muscle tone. Coordination normal.  Skin: Skin is warm and dry. No rash noted. He is not diaphoretic. No erythema. No pallor.  Psychiatric: He has a normal mood and affect. His behavior is normal. Thought content normal. His speech is delayed.   Skin: wound on right thigh 12/24/15:     Skin today  18/18:          Assessment & Plan:   MRSA bacteremia:   should have received adequate therapy  Recurrent MRSA infections: Ones that work found here at; were tetracycline resistance of Bactrim would be to go to drug for him for treatment along with proper I&D and drainage. We will see her continue with additional hours with Hibiclens and intranasal mupirocin  DM: very poorly controlled. I told him he clearly needs to bring down his A1c  Recurrent panniculitis a not clear what is driving this a type of allergy or eczema he claims it happens every year   MRSA colonization: see above  care.
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
## 2022-09-05 ENCOUNTER — Other Ambulatory Visit: Payer: Self-pay

## 2022-09-05 ENCOUNTER — Encounter (HOSPITAL_COMMUNITY): Payer: Self-pay

## 2022-09-05 ENCOUNTER — Inpatient Hospital Stay (HOSPITAL_COMMUNITY)
Admission: EM | Admit: 2022-09-05 | Discharge: 2022-09-19 | DRG: 871 | Disposition: A | Discharge status: Skilled Nursing Facility | Payer: Medicare PPO | Attending: Internal Medicine | Admitting: Internal Medicine

## 2022-09-05 ENCOUNTER — Emergency Department (HOSPITAL_COMMUNITY): Payer: Medicare PPO

## 2022-09-05 DIAGNOSIS — F411 Generalized anxiety disorder: Secondary | ICD-10-CM | POA: Diagnosis present

## 2022-09-05 DIAGNOSIS — F329 Major depressive disorder, single episode, unspecified: Secondary | ICD-10-CM | POA: Diagnosis present

## 2022-09-05 DIAGNOSIS — I2699 Other pulmonary embolism without acute cor pulmonale: Secondary | ICD-10-CM | POA: Diagnosis present

## 2022-09-05 DIAGNOSIS — E669 Obesity, unspecified: Secondary | ICD-10-CM | POA: Diagnosis present

## 2022-09-05 DIAGNOSIS — Z7984 Long term (current) use of oral hypoglycemic drugs: Secondary | ICD-10-CM

## 2022-09-05 DIAGNOSIS — Z6830 Body mass index (BMI) 30.0-30.9, adult: Secondary | ICD-10-CM

## 2022-09-05 DIAGNOSIS — J189 Pneumonia, unspecified organism: Secondary | ICD-10-CM | POA: Diagnosis present

## 2022-09-05 DIAGNOSIS — E1110 Type 2 diabetes mellitus with ketoacidosis without coma: Secondary | ICD-10-CM | POA: Diagnosis present

## 2022-09-05 DIAGNOSIS — Z86718 Personal history of other venous thrombosis and embolism: Secondary | ICD-10-CM

## 2022-09-05 DIAGNOSIS — Z8614 Personal history of Methicillin resistant Staphylococcus aureus infection: Secondary | ICD-10-CM

## 2022-09-05 DIAGNOSIS — G3184 Mild cognitive impairment, so stated: Secondary | ICD-10-CM | POA: Diagnosis present

## 2022-09-05 DIAGNOSIS — E876 Hypokalemia: Secondary | ICD-10-CM | POA: Diagnosis present

## 2022-09-05 DIAGNOSIS — Z79899 Other long term (current) drug therapy: Secondary | ICD-10-CM

## 2022-09-05 DIAGNOSIS — R55 Syncope and collapse: Secondary | ICD-10-CM | POA: Diagnosis present

## 2022-09-05 DIAGNOSIS — Z7901 Long term (current) use of anticoagulants: Secondary | ICD-10-CM

## 2022-09-05 DIAGNOSIS — E873 Alkalosis: Secondary | ICD-10-CM | POA: Diagnosis present

## 2022-09-05 DIAGNOSIS — Z820 Family history of epilepsy and other diseases of the nervous system: Secondary | ICD-10-CM

## 2022-09-05 DIAGNOSIS — K219 Gastro-esophageal reflux disease without esophagitis: Secondary | ICD-10-CM | POA: Diagnosis present

## 2022-09-05 DIAGNOSIS — Z86711 Personal history of pulmonary embolism: Secondary | ICD-10-CM

## 2022-09-05 DIAGNOSIS — Z794 Long term (current) use of insulin: Secondary | ICD-10-CM

## 2022-09-05 DIAGNOSIS — W19XXXA Unspecified fall, initial encounter: Secondary | ICD-10-CM | POA: Diagnosis present

## 2022-09-05 DIAGNOSIS — A419 Sepsis, unspecified organism: Secondary | ICD-10-CM | POA: Diagnosis not present

## 2022-09-05 DIAGNOSIS — I10 Essential (primary) hypertension: Secondary | ICD-10-CM | POA: Diagnosis present

## 2022-09-05 DIAGNOSIS — E785 Hyperlipidemia, unspecified: Secondary | ICD-10-CM | POA: Diagnosis present

## 2022-09-05 DIAGNOSIS — L89321 Pressure ulcer of left buttock, stage 1: Secondary | ICD-10-CM | POA: Diagnosis present

## 2022-09-05 DIAGNOSIS — Z833 Family history of diabetes mellitus: Secondary | ICD-10-CM

## 2022-09-05 DIAGNOSIS — J90 Pleural effusion, not elsewhere classified: Secondary | ICD-10-CM | POA: Diagnosis present

## 2022-09-05 DIAGNOSIS — D6959 Other secondary thrombocytopenia: Secondary | ICD-10-CM | POA: Diagnosis present

## 2022-09-05 DIAGNOSIS — E119 Type 2 diabetes mellitus without complications: Secondary | ICD-10-CM

## 2022-09-05 DIAGNOSIS — Z1152 Encounter for screening for COVID-19: Secondary | ICD-10-CM

## 2022-09-05 DIAGNOSIS — K760 Fatty (change of) liver, not elsewhere classified: Secondary | ICD-10-CM | POA: Diagnosis present

## 2022-09-05 LAB — COMPREHENSIVE METABOLIC PANEL
ALT: 14 U/L (ref 0–44)
AST: 18 U/L (ref 15–41)
Albumin: 3.6 g/dL (ref 3.5–5.0)
Alkaline Phosphatase: 60 U/L (ref 38–126)
Anion gap: 13 (ref 5–15)
BUN: 13 mg/dL (ref 6–20)
CO2: 20 mmol/L — ABNORMAL LOW (ref 22–32)
Calcium: 8.8 mg/dL — ABNORMAL LOW (ref 8.9–10.3)
Chloride: 100 mmol/L (ref 98–111)
Creatinine, Ser: 0.8 mg/dL (ref 0.61–1.24)
GFR, Estimated: >60 mL/min (ref >60)
Glucose, Bld: 262 mg/dL — ABNORMAL HIGH (ref 70–99)
Potassium: 3 mmol/L — ABNORMAL LOW (ref 3.5–5.1)
Sodium: 133 mmol/L — ABNORMAL LOW (ref 135–145)
Total Bilirubin: 1.2 mg/dL (ref 0.3–1.2)
Total Protein: 7.4 g/dL (ref 6.5–8.1)

## 2022-09-05 LAB — URINALYSIS, W/ REFLEX TO CULTURE (INFECTION SUSPECTED)
Bacteria, UA: NONE SEEN
Bilirubin Urine: NEGATIVE
Glucose, UA: >=500 mg/dL — AB
Ketones, ur: 20 mg/dL — AB
Leukocytes,Ua: NEGATIVE
Nitrite: NEGATIVE
Protein, ur: 30 mg/dL — AB
Specific Gravity, Urine: 1.033 — ABNORMAL HIGH (ref 1.005–1.030)
pH: 5 (ref 5.0–8.0)

## 2022-09-05 LAB — CBC WITH DIFFERENTIAL/PLATELET
Abs Immature Granulocytes: 0.07 10*3/uL (ref 0.00–0.07)
Basophils Absolute: 0 10*3/uL (ref 0.0–0.1)
Basophils Relative: 0 %
Eosinophils Absolute: 0 10*3/uL (ref 0.0–0.5)
Eosinophils Relative: 0 %
HCT: 42.6 % (ref 39.0–52.0)
Hemoglobin: 14.7 g/dL (ref 13.0–17.0)
Immature Granulocytes: 1 %
Lymphocytes Relative: 3 %
Lymphs Abs: 0.4 10*3/uL — ABNORMAL LOW (ref 0.7–4.0)
MCH: 32 pg (ref 26.0–34.0)
MCHC: 34.5 g/dL (ref 30.0–36.0)
MCV: 92.8 fL (ref 80.0–100.0)
Monocytes Absolute: 0.9 10*3/uL (ref 0.1–1.0)
Monocytes Relative: 7 %
Neutro Abs: 12 10*3/uL — ABNORMAL HIGH (ref 1.7–7.7)
Neutrophils Relative %: 89 %
Platelets: 139 10*3/uL — ABNORMAL LOW (ref 150–400)
RBC: 4.59 MIL/uL (ref 4.22–5.81)
RDW: 13 % (ref 11.5–15.5)
WBC: 13.4 10*3/uL — ABNORMAL HIGH (ref 4.0–10.5)
nRBC: 0 % (ref 0.0–0.2)

## 2022-09-05 LAB — PROTIME-INR
INR: 1.4 — ABNORMAL HIGH (ref 0.8–1.2)
Prothrombin Time: 17 seconds — ABNORMAL HIGH (ref 11.4–15.2)

## 2022-09-05 MED ORDER — SODIUM CHLORIDE 0.9 % IV SOLN
500.0000 mg | Freq: Once | INTRAVENOUS | Status: AC
  Administered 2022-09-06: 500 mg via INTRAVENOUS
  Filled 2022-09-05: qty 5

## 2022-09-05 MED ORDER — SODIUM CHLORIDE 0.9 % IV SOLN
1.0000 g | Freq: Once | INTRAVENOUS | Status: AC
  Administered 2022-09-06: 1 g via INTRAVENOUS
  Filled 2022-09-05: qty 10

## 2022-09-05 MED ORDER — SODIUM CHLORIDE 0.9 % IV BOLUS
1000.0000 mL | Freq: Once | INTRAVENOUS | Status: AC
  Administered 2022-09-06: 1000 mL via INTRAVENOUS

## 2022-09-05 NOTE — ED Notes (Signed)
Sent urine with culture  

## 2022-09-05 NOTE — ED Provider Notes (Signed)
EMERGENCY DEPARTMENT AT [facility] Provider Note   CSN: [id number] Arrival date & time: 09/05/22  2251     History {Add pertinent medical, surgical, social history, OB history to HPI:1} Chief Complaint  Patient presents with   Loss of Consciousness   Weakness   Tachycardia    Bradley Mcclure is a 62 y.o. male.  The history is provided by the patient and medical records.  Loss of Consciousness Associated symptoms: weakness   Weakness Associated symptoms: syncope   Bradley Mcclure is a 62 y.o. male who presents to the Emergency Department complaining of *** Knees won't act right Went to pick up something off the floor and fell.  Hit head. No LOC. 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 Takes as prescribed. No hematochezia, melena.  No fever, chest pain, sob Has cough for four days     Home Medications Prior to Admission medications   Medication Sig Start Date End Date Taking? Authorizing Provider  acetaminophen (TYLENOL) 325 MG tablet Take 650 mg by mouth every 6 (six) hours as needed for mild pain.     [provider]  Cholecalciferol (VITAMIN D-1000 MAX ST) 1000 units tablet Take 2,000 Units by mouth daily.    [provider]  glucose blood test strip 1 each by Other route as needed for other. Use as instructed    [provider]  HYDROcodone-acetaminophen (NORCO/VICODIN) 5-325 MG tablet Take 1-2 tablets by mouth every 6 (six) hours as needed for moderate pain. 03/01/16   Althia Forts, MD  insulin glargine (LANTUS) 100 UNIT/ML injection Inject 0.75 mLs (75 Units total) into the skin 2 (two) times daily. Patient taking differently: Inject 90 Units into the skin 2 (two) times daily.  11/17/15   Vassie Loll, MD  Liraglutide (VICTOZA) 18 MG/3ML SOPN Inject 1.8 mg into the skin daily.     [provider]  losartan (COZAAR) 50 MG tablet Take 50 mg by mouth daily.    [provider]  metFORMIN (GLUCOPHAGE) 500  MG tablet Take 1,000 mg by mouth 2 (two) times daily with a meal.     [provider]  Multiple Vitamin (MULTIVITAMIN) tablet Take 1 tablet by mouth daily.    [provider]  mupirocin ointment (BACTROBAN) 2 % Place 1 application into the nose 2 (two) times daily. 12/24/15   Randall Hiss, MD  polyethylene glycol [facility]) packet Take 17 g by mouth daily. 03/02/16   Althia Forts, MD  pravastatin (PRAVACHOL) 40 MG tablet Take 40 mg by mouth daily.    [provider]  rivaroxaban (XARELTO) 20 MG TABS tablet Take 20 mg by mouth daily with supper.    [provider]  senna (SENOKOT) 8.6 MG TABS tablet Take 2 tablets (17.2 mg total) by mouth 2 (two) times daily. 03/01/16   Althia Forts, MD  silver sulfADIAZINE (SILVADENE) 1 % cream Apply topically daily. 03/02/16   Althia Forts, MD  Skin Protectants, Misc. (INTERDRY AG TEXTILE 29"F621") SHEE Apply 2 packets topically once a week. 11/17/15   Vassie Loll, MD  sulfamethoxazole-trimethoprim (BACTRIM DS,SEPTRA DS) 800-160 MG tablet Take 2 tablets by mouth every 12 (twelve) hours. 03/01/16   Althia Forts, MD      Allergies    Patient has no known allergies.    Review of Systems   Review of Systems  Cardiovascular:  Positive for syncope.  Neurological:  Positive for weakness.    Physical Exam Updated Vital  Signs BP (!) 99/53   Pulse (!) 110   Temp 98.8 F (37.1 C) (Oral)   Resp 20   Ht 6\' 1"  (1.854 m)   Wt 102.1 kg   SpO2 (!) 89%   BMI 29.69 kg/m  Physical Exam  ED Results / Procedures / Treatments   Labs (all labs ordered are listed, but only abnormal results are displayed) Labs Reviewed  CULTURE, BLOOD (ROUTINE X 2)  CULTURE, BLOOD (ROUTINE X 2)  RESP PANEL BY RT-PCR (RSV, FLU A&B, COVID)  [id number]  COMPREHENSIVE METABOLIC PANEL  LACTIC ACID, PLASMA  LACTIC ACID, PLASMA  CBC WITH DIFFERENTIAL/PLATELET  PROTIME-INR  URINALYSIS, W/ REFLEX TO CULTURE (INFECTION SUSPECTED)     EKG None  Radiology No results found.  Procedures Procedures  {Document cardiac monitor, telemetry assessment procedure when appropriate:1}  Medications Ordered in ED Medications - No data to display  ED Course/ Medical Decision Making/ A&P   {   Click here for ABCD2, HEART and other calculatorsREFRESH Note before signing :1}                          Medical Decision Making Amount and/or Complexity of Data Reviewed Labs: ordered. Radiology: ordered.   ***  {Document critical care time when appropriate:1} {Document review of labs and clinical decision tools ie heart score, Chads2Vasc2 etc:1}  {Document your independent review of radiology images, and any outside records:1} {Document your discussion with family members, caretakers, and with consultants:1} {Document social determinants of health affecting pt's care:1} {Document your decision making why or why not admission, treatments were needed:1} Final Clinical Impression(s) / ED Diagnoses Final diagnoses:  None    Rx / DC Orders ED Discharge Orders     None
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
## 2022-09-06 ENCOUNTER — Encounter (HOSPITAL_COMMUNITY): Payer: Self-pay

## 2022-09-06 ENCOUNTER — Emergency Department (HOSPITAL_COMMUNITY): Payer: Medicare PPO

## 2022-09-06 DIAGNOSIS — E876 Hypokalemia: Secondary | ICD-10-CM | POA: Diagnosis present

## 2022-09-06 DIAGNOSIS — Z820 Family history of epilepsy and other diseases of the nervous system: Secondary | ICD-10-CM | POA: Diagnosis not present

## 2022-09-06 DIAGNOSIS — E785 Hyperlipidemia, unspecified: Secondary | ICD-10-CM | POA: Diagnosis present

## 2022-09-06 DIAGNOSIS — Z86711 Personal history of pulmonary embolism: Secondary | ICD-10-CM | POA: Diagnosis not present

## 2022-09-06 DIAGNOSIS — Z8669 Personal history of other diseases of the nervous system and sense organs: Secondary | ICD-10-CM | POA: Insufficient documentation

## 2022-09-06 DIAGNOSIS — F09 Unspecified mental disorder due to known physiological condition: Secondary | ICD-10-CM

## 2022-09-06 DIAGNOSIS — R55 Syncope and collapse: Secondary | ICD-10-CM | POA: Diagnosis not present

## 2022-09-06 DIAGNOSIS — Z1152 Encounter for screening for COVID-19: Secondary | ICD-10-CM | POA: Diagnosis not present

## 2022-09-06 DIAGNOSIS — I10 Essential (primary) hypertension: Secondary | ICD-10-CM | POA: Diagnosis present

## 2022-09-06 DIAGNOSIS — D6959 Other secondary thrombocytopenia: Secondary | ICD-10-CM | POA: Diagnosis present

## 2022-09-06 DIAGNOSIS — K760 Fatty (change of) liver, not elsewhere classified: Secondary | ICD-10-CM | POA: Diagnosis present

## 2022-09-06 DIAGNOSIS — J189 Pneumonia, unspecified organism: Secondary | ICD-10-CM | POA: Diagnosis present

## 2022-09-06 DIAGNOSIS — Z79899 Other long term (current) drug therapy: Secondary | ICD-10-CM | POA: Diagnosis not present

## 2022-09-06 DIAGNOSIS — E1140 Type 2 diabetes mellitus with diabetic neuropathy, unspecified: Secondary | ICD-10-CM | POA: Insufficient documentation

## 2022-09-06 DIAGNOSIS — F329 Major depressive disorder, single episode, unspecified: Secondary | ICD-10-CM | POA: Diagnosis present

## 2022-09-06 DIAGNOSIS — Z7901 Long term (current) use of anticoagulants: Secondary | ICD-10-CM | POA: Diagnosis not present

## 2022-09-06 DIAGNOSIS — W19XXXA Unspecified fall, initial encounter: Secondary | ICD-10-CM | POA: Diagnosis present

## 2022-09-06 DIAGNOSIS — Z6830 Body mass index (BMI) 30.0-30.9, adult: Secondary | ICD-10-CM | POA: Diagnosis not present

## 2022-09-06 DIAGNOSIS — E873 Alkalosis: Secondary | ICD-10-CM | POA: Diagnosis present

## 2022-09-06 DIAGNOSIS — E669 Obesity, unspecified: Secondary | ICD-10-CM | POA: Diagnosis present

## 2022-09-06 DIAGNOSIS — E1110 Type 2 diabetes mellitus with ketoacidosis without coma: Secondary | ICD-10-CM | POA: Diagnosis present

## 2022-09-06 DIAGNOSIS — Z7984 Long term (current) use of oral hypoglycemic drugs: Secondary | ICD-10-CM | POA: Diagnosis not present

## 2022-09-06 DIAGNOSIS — L89321 Pressure ulcer of left buttock, stage 1: Secondary | ICD-10-CM | POA: Diagnosis present

## 2022-09-06 DIAGNOSIS — J90 Pleural effusion, not elsewhere classified: Secondary | ICD-10-CM | POA: Diagnosis present

## 2022-09-06 DIAGNOSIS — Z833 Family history of diabetes mellitus: Secondary | ICD-10-CM | POA: Diagnosis not present

## 2022-09-06 DIAGNOSIS — A419 Sepsis, unspecified organism: Secondary | ICD-10-CM | POA: Diagnosis present

## 2022-09-06 DIAGNOSIS — E119 Type 2 diabetes mellitus without complications: Secondary | ICD-10-CM | POA: Diagnosis not present

## 2022-09-06 DIAGNOSIS — Z794 Long term (current) use of insulin: Secondary | ICD-10-CM | POA: Diagnosis not present

## 2022-09-06 DIAGNOSIS — Z8614 Personal history of Methicillin resistant Staphylococcus aureus infection: Secondary | ICD-10-CM | POA: Diagnosis not present

## 2022-09-06 DIAGNOSIS — K219 Gastro-esophageal reflux disease without esophagitis: Secondary | ICD-10-CM | POA: Diagnosis present

## 2022-09-06 DIAGNOSIS — F411 Generalized anxiety disorder: Secondary | ICD-10-CM

## 2022-09-06 HISTORY — DX: Generalized anxiety disorder: F41.1

## 2022-09-06 HISTORY — DX: Major depressive disorder, single episode, unspecified: F32.9

## 2022-09-06 HISTORY — DX: Hyperlipidemia, unspecified: E78.5

## 2022-09-06 HISTORY — DX: Unspecified mental disorder due to known physiological condition: F09

## 2022-09-06 LAB — CBC WITH DIFFERENTIAL/PLATELET
Abs Immature Granulocytes: 0.04 10*3/uL (ref 0.00–0.07)
Basophils Absolute: 0 10*3/uL (ref 0.0–0.1)
Basophils Relative: 0 %
Eosinophils Absolute: 0.1 10*3/uL (ref 0.0–0.5)
Eosinophils Relative: 1 %
HCT: 40.1 % (ref 39.0–52.0)
Hemoglobin: 13.3 g/dL (ref 13.0–17.0)
Immature Granulocytes: 0 %
Lymphocytes Relative: 10 %
Lymphs Abs: 1.1 10*3/uL (ref 0.7–4.0)
MCH: 31.2 pg (ref 26.0–34.0)
MCHC: 33.2 g/dL (ref 30.0–36.0)
MCV: 94.1 fL (ref 80.0–100.0)
Monocytes Absolute: 1.1 10*3/uL — ABNORMAL HIGH (ref 0.1–1.0)
Monocytes Relative: 10 %
Neutro Abs: 9.3 10*3/uL — ABNORMAL HIGH (ref 1.7–7.7)
Neutrophils Relative %: 79 %
Platelets: 135 10*3/uL — ABNORMAL LOW (ref 150–400)
RBC: 4.26 MIL/uL (ref 4.22–5.81)
RDW: 13 % (ref 11.5–15.5)
WBC: 11.7 10*3/uL — ABNORMAL HIGH (ref 4.0–10.5)
nRBC: 0 % (ref 0.0–0.2)

## 2022-09-06 LAB — BASIC METABOLIC PANEL
Anion gap: 10 (ref 5–15)
BUN: 10 mg/dL (ref 6–20)
CO2: 23 mmol/L (ref 22–32)
Calcium: 8.1 mg/dL — ABNORMAL LOW (ref 8.9–10.3)
Chloride: 103 mmol/L (ref 98–111)
Creatinine, Ser: 0.63 mg/dL (ref 0.61–1.24)
GFR, Estimated: >60 mL/min (ref >60)
Glucose, Bld: 114 mg/dL — ABNORMAL HIGH (ref 70–99)
Potassium: 3.5 mmol/L (ref 3.5–5.1)
Sodium: 136 mmol/L (ref 135–145)

## 2022-09-06 LAB — MAGNESIUM: Magnesium: 1.8 mg/dL (ref 1.7–2.4)

## 2022-09-06 LAB — CBG MONITORING, ED: Glucose-Capillary: 101 mg/dL — ABNORMAL HIGH (ref 70–99)

## 2022-09-06 LAB — TROPONIN I (HIGH SENSITIVITY)
Troponin I (High Sensitivity): 6 ng/L (ref <18)
Troponin I (High Sensitivity): 6 ng/L (ref <18)

## 2022-09-06 LAB — HEMOGLOBIN A1C
Hgb A1c MFr Bld: 6.8 % — ABNORMAL HIGH (ref 4.8–5.6)
Mean Plasma Glucose: 148.46 mg/dL

## 2022-09-06 LAB — GLUCOSE, CAPILLARY
Glucose-Capillary: 149 mg/dL — ABNORMAL HIGH (ref 70–99)
Glucose-Capillary: 153 mg/dL — ABNORMAL HIGH (ref 70–99)
Glucose-Capillary: 139 mg/dL — ABNORMAL HIGH (ref 70–99)

## 2022-09-06 LAB — RESP PANEL BY RT-PCR (RSV, FLU A&B, COVID)  RVPGX2
Influenza A by PCR: NEGATIVE
Influenza B by PCR: NEGATIVE
Resp Syncytial Virus by PCR: NEGATIVE
SARS Coronavirus 2 by RT PCR: NEGATIVE

## 2022-09-06 LAB — LACTIC ACID, PLASMA
Lactic Acid, Venous: 1.5 mmol/L (ref 0.5–1.9)
Lactic Acid, Venous: 1.9 mmol/L (ref 0.5–1.9)

## 2022-09-06 LAB — STREP PNEUMONIAE URINARY ANTIGEN: Strep Pneumo Urinary Antigen: NEGATIVE

## 2022-09-06 LAB — BRAIN NATRIURETIC PEPTIDE: B Natriuretic Peptide: 61.7 pg/mL (ref 0.0–100.0)

## 2022-09-06 MED ORDER — ALBUTEROL SULFATE (2.5 MG/3ML) 0.083% IN NEBU: 2.5000 mg | INHALATION_SOLUTION | RESPIRATORY_TRACT | Status: DC | PRN

## 2022-09-06 MED ORDER — GUAIFENESIN 100 MG/5ML PO LIQD
5.0000 mL | ORAL | Status: DC | PRN
  Administered 2022-09-17 – 2022-09-18 (×2): 5 mL via ORAL
  Filled 2022-09-06 (×2): qty 10

## 2022-09-06 MED ORDER — PANTOPRAZOLE SODIUM 40 MG PO TBEC
DELAYED_RELEASE_TABLET | ORAL | Status: AC
  Filled 2022-09-06: qty 1

## 2022-09-06 MED ORDER — HYDROCODONE-ACETAMINOPHEN 10-325 MG PO TABS
ORAL_TABLET | ORAL | Status: AC
  Filled 2022-09-06: qty 1

## 2022-09-06 MED ORDER — ARIPIPRAZOLE 2 MG PO TABS
2.0000 mg | ORAL_TABLET | Freq: Every day | ORAL | Status: DC
  Administered 2022-09-07 – 2022-09-19 (×11): 2 mg via ORAL
  Filled 2022-09-06 (×13): qty 1

## 2022-09-06 MED ORDER — MONTELUKAST SODIUM 10 MG PO TABS
ORAL_TABLET | ORAL | Status: AC
  Filled 2022-09-06: qty 1

## 2022-09-06 MED ORDER — INSULIN ASPART 100 UNIT/ML IJ SOLN
0.0000 [IU] | Freq: Three times a day (TID) | INTRAMUSCULAR | Status: DC
  Administered 2022-09-06: 3 [IU] via SUBCUTANEOUS
  Administered 2022-09-06 – 2022-09-08 (×2): 2 [IU] via SUBCUTANEOUS
  Administered 2022-09-09: 3 [IU] via SUBCUTANEOUS
  Filled 2022-09-06: qty 0.15

## 2022-09-06 MED ORDER — EMPAGLIFLOZIN 25 MG PO TABS
25.0000 mg | ORAL_TABLET | Freq: Every day | ORAL | Status: DC
  Administered 2022-09-07 – 2022-09-08 (×2): 25 mg via ORAL
  Filled 2022-09-06 (×3): qty 1

## 2022-09-06 MED ORDER — INSULIN ASPART 100 UNIT/ML IJ SOLN
0.0000 [IU] | Freq: Every day | INTRAMUSCULAR | Status: DC
  Administered 2022-09-08: 2 [IU] via SUBCUTANEOUS
  Filled 2022-09-06: qty 0.05

## 2022-09-06 MED ORDER — ORAL CARE MOUTH RINSE: 15.0000 mL | OROMUCOSAL | Status: DC | PRN

## 2022-09-06 MED ORDER — ACETAMINOPHEN 325 MG PO TABS
650.0000 mg | ORAL_TABLET | Freq: Four times a day (QID) | ORAL | Status: DC | PRN
  Administered 2022-09-06 – 2022-09-18 (×12): 650 mg via ORAL
  Filled 2022-09-06 (×13): qty 2

## 2022-09-06 MED ORDER — RIVAROXABAN 20 MG PO TABS
20.0000 mg | ORAL_TABLET | Freq: Every day | ORAL | Status: DC
  Administered 2022-09-06 – 2022-09-18 (×13): 20 mg via ORAL
  Filled 2022-09-06 (×13): qty 1

## 2022-09-06 MED ORDER — SODIUM CHLORIDE 0.9 % IV SOLN
2.0000 g | INTRAVENOUS | Status: DC
  Administered 2022-09-07 – 2022-09-08 (×2): 2 g via INTRAVENOUS
  Filled 2022-09-06 (×2): qty 20

## 2022-09-06 MED ORDER — ALBUTEROL SULFATE (2.5 MG/3ML) 0.083% IN NEBU
2.5000 mg | INHALATION_SOLUTION | RESPIRATORY_TRACT | Status: DC | PRN
  Administered 2022-09-09: 2.5 mg via RESPIRATORY_TRACT
  Filled 2022-09-06: qty 3

## 2022-09-06 MED ORDER — METFORMIN HCL 500 MG PO TABS
1000.0000 mg | ORAL_TABLET | Freq: Two times a day (BID) | ORAL | Status: DC
  Administered 2022-09-08 (×2): 1000 mg via ORAL
  Filled 2022-09-06 (×4): qty 2

## 2022-09-06 MED ORDER — NYSTATIN 100000 UNIT/ML MT SUSP
OROMUCOSAL | Status: AC
  Filled 2022-09-06: qty 5

## 2022-09-06 MED ORDER — DIAZEPAM 5 MG PO TABS
ORAL_TABLET | ORAL | Status: AC
  Filled 2022-09-06: qty 2

## 2022-09-06 MED ORDER — SODIUM CHLORIDE 0.9 % IV SOLN
1.0000 g | Freq: Once | INTRAVENOUS | Status: AC
  Administered 2022-09-06: 1 g via INTRAVENOUS
  Filled 2022-09-06: qty 10

## 2022-09-06 MED ORDER — SODIUM CHLORIDE 0.9 % IV SOLN
500.0000 mg | INTRAVENOUS | Status: AC
  Administered 2022-09-07 – 2022-09-10 (×4): 500 mg via INTRAVENOUS
  Filled 2022-09-06 (×4): qty 5

## 2022-09-06 MED ORDER — POTASSIUM CHLORIDE CRYS ER 20 MEQ PO TBCR
40.0000 meq | EXTENDED_RELEASE_TABLET | Freq: Once | ORAL | Status: AC
  Administered 2022-09-06: 40 meq via ORAL
  Filled 2022-09-06: qty 2

## 2022-09-06 MED ORDER — ONDANSETRON HCL 4 MG/2ML IJ SOLN: 4.0000 mg | Freq: Four times a day (QID) | INTRAMUSCULAR | Status: DC | PRN

## 2022-09-06 MED ORDER — INSULIN GLARGINE-YFGN 100 UNIT/ML ~~LOC~~ SOLN
10.0000 [IU] | Freq: Once | SUBCUTANEOUS | Status: AC
  Administered 2022-09-06: 10 [IU] via SUBCUTANEOUS
  Filled 2022-09-06: qty 0.1

## 2022-09-06 MED ORDER — BUSPIRONE HCL 5 MG PO TABS
ORAL_TABLET | ORAL | Status: AC
  Filled 2022-09-06: qty 1

## 2022-09-06 MED ORDER — IOHEXOL 350 MG/ML SOLN
75.0000 mL | Freq: Once | INTRAVENOUS | Status: AC | PRN
  Administered 2022-09-06: 75 mL via INTRAVENOUS

## 2022-09-06 MED ORDER — IPRATROPIUM-ALBUTEROL 0.5-2.5 (3) MG/3ML IN SOLN
3.0000 mL | Freq: Four times a day (QID) | RESPIRATORY_TRACT | Status: DC
  Administered 2022-09-06 – 2022-09-07 (×5): 3 mL via RESPIRATORY_TRACT
  Filled 2022-09-06 (×6): qty 3

## 2022-09-06 MED ORDER — HYDROCOD POLI-CHLORPHE POLI ER 10-8 MG/5ML PO SUER
ORAL | Status: AC
  Filled 2022-09-06: qty 5

## 2022-09-06 MED ORDER — BUSPIRONE HCL 10 MG PO TABS
ORAL_TABLET | ORAL | Status: AC
  Filled 2022-09-06: qty 1

## 2022-09-06 MED ORDER — GABAPENTIN 300 MG PO CAPS
ORAL_CAPSULE | ORAL | Status: AC
  Filled 2022-09-06: qty 1

## 2022-09-06 MED ORDER — LOSARTAN POTASSIUM 50 MG PO TABS
50.0000 mg | ORAL_TABLET | Freq: Every day | ORAL | Status: DC
  Administered 2022-09-06 – 2022-09-19 (×12): 50 mg via ORAL
  Filled 2022-09-06 (×12): qty 1

## 2022-09-06 MED ORDER — SODIUM CHLORIDE 0.9 % IV SOLN: 1.0000 g | INTRAVENOUS | Status: DC

## 2022-09-06 MED ORDER — POTASSIUM CHLORIDE IN NACL 20-0.9 MEQ/L-% IV SOLN
INTRAVENOUS | Status: DC
  Administered 2022-09-07: 100 mL/h via INTRAVENOUS
  Filled 2022-09-06 (×5): qty 1000

## 2022-09-06 MED ORDER — SODIUM CHLORIDE 0.9% FLUSH
3.0000 mL | Freq: Two times a day (BID) | INTRAVENOUS | Status: DC
  Administered 2022-09-06 – 2022-09-19 (×22): 3 mL via INTRAVENOUS

## 2022-09-06 NOTE — ED Notes (Signed)
ED TO INPATIENT HANDOFF REPORT  Name/Age/Gender Bradley Mcclure 62 y.o. male  Code Status Code Status History     Date Active Date Inactive Code Status Order ID Comments User Context   02/28/2016 1739 03/01/2016 1755 Full Code [id number]  Deneise Lever, MD Inpatient   11/11/2015 0505 11/17/2015 2143 Full Code [id number]  Lorretta Harp, MD Inpatient      Advance Directive Documentation    Flowsheet Row Most Recent Value  Type of Advance Directive Healthcare Power of Attorney, Living will  Pre-existing out of facility DNR order (yellow form or pink MOST form) --  "MOST" Form in Place? --       Home/SNF/Other Home  Chief Complaint Multifocal pneumonia [J18.9]  Level of Care/Admitting Diagnosis ED Disposition     ED Disposition  Admit   Condition  --   Comment  Hospital Area: [facility] [[id number]]  Level of Care: Progressive [102]  Admit to Progressive based on following criteria: MULTISYSTEM THREATS such as stable sepsis, metabolic/electrolyte imbalance with or without encephalopathy that is responding to early treatment.  Admit to Progressive based on following criteria: RESPIRATORY PROBLEMS hypoxemic/hypercapnic respiratory failure that is responsive to NIPPV (BiPAP) or High Flow Nasal Cannula (6-80 lpm). Frequent assessment/intervention, no > Q2 hrs < Q4 hrs, to maintain oxygenation and pulmonary hygiene.  May admit patient to [facility] or [facility] if equivalent level of care is available:: No  Covid Evaluation: Confirmed COVID Negative  Diagnosis: Multifocal pneumonia [[id number]]  Admitting Physician: Bobette Mo [[id number]]  Attending Physician: Bobette Mo [[id number]]  Certification:: I certify this patient will need inpatient services for at least 2 midnights  Estimated Length of Stay: 2          Medical History Past Medical History:  Diagnosis Date   Balanitis 12/24/2015   Cellulitis and abscess 12/24/2015   Hypertension     IBS (irritable bowel syndrome)    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   Pneumonia 2014-2015 X 1   Type II diabetes mellitus (HCC)     Allergies No Known Allergies  IV Location/Drains/Wounds Patient Lines/Drains/Airways Status     Active Line/Drains/Airways     Name Placement date Placement time Site Days   Peripheral IV 09/05/22 18 G Left;Posterior Forearm 09/05/22  2305  Forearm  1   Peripheral IV 09/05/22 20 G Left;Posterior Hand 09/05/22  2320  Hand  1   External Urinary Catheter 09/06/22  0209  --  less than 1            Labs/Imaging Results for orders placed or performed during the hospital encounter of 09/05/22 (from the past 48 hour(s))  Brain natriuretic peptide     Status: None   Collection Time: 09/05/22 12:59 AM  Result Value Ref Range   B Natriuretic Peptide 61.7 0.0 - 100.0 pg/mL    Comment: Performed at [facility], [address] [place], [place] [address]  Resp panel by RT-PCR (RSV, Flu A&B, Covid) Anterior Nasal Swab     Status: None   Collection Time: 09/05/22 11:17 PM   Specimen: Anterior Nasal Swab  Result Value Ref Range   SARS Coronavirus 2 by RT PCR NEGATIVE NEGATIVE    Comment: (NOTE) SARS-CoV-2 target nucleic acids are NOT DETECTED.  The SARS-CoV-2 RNA is generally detectable in upper respiratory specimens during the acute phase of infection. The lowest concentration of SARS-CoV-2 viral copies this assay can detect is 138 copies/mL. A negative result does not preclude SARS-Cov-2 infection  and should not be used as the sole basis for treatment or other patient management decisions. A negative result may occur with  improper specimen collection/handling, submission of specimen other than nasopharyngeal swab, presence of viral mutation(s) within the areas targeted by this assay, and inadequate number of viral copies(<138 copies/mL). A negative result must be combined with clinical observations, patient history, and  epidemiological information. The expected result is Negative.  Fact Sheet for Patients:  BloggerCourse.com  Fact Sheet for Healthcare Providers:  SeriousBroker.it  This test is no t yet approved or cleared by the [place] FDA and  has been authorized for detection and/or diagnosis of SARS-CoV-2 by FDA under an Emergency Use Authorization (EUA). This EUA will remain  in effect (meaning this test can be used) for the duration of the COVID-19 declaration under Section 564(b)(1) of the Act, 21 U.S.C.section 360bbb-3(b)(1), unless the authorization is terminated  or revoked sooner.       Influenza A by PCR NEGATIVE NEGATIVE   Influenza B by PCR NEGATIVE NEGATIVE    Comment: (NOTE) The Xpert Xpress SARS-CoV-2/FLU/RSV plus assay is intended as an aid in the diagnosis of influenza from Nasopharyngeal swab specimens and should not be used as a sole basis for treatment. Nasal washings and aspirates are unacceptable for Xpert Xpress SARS-CoV-2/FLU/RSV testing.  Fact Sheet for Patients: BloggerCourse.com  Fact Sheet for Healthcare Providers: SeriousBroker.it  This test is not yet approved or cleared by the [place] FDA and has been authorized for detection and/or diagnosis of SARS-CoV-2 by FDA under an Emergency Use Authorization (EUA). This EUA will remain in effect (meaning this test can be used) for the duration of the COVID-19 declaration under Section 564(b)(1) of the Act, 21 U.S.C. section 360bbb-3(b)(1), unless the authorization is terminated or revoked.     Resp Syncytial Virus by PCR NEGATIVE NEGATIVE    Comment: (NOTE) Fact Sheet for Patients: BloggerCourse.com  Fact Sheet for Healthcare Providers: SeriousBroker.it  This test is not yet approved or cleared by the [place] FDA and has been authorized for  detection and/or diagnosis of SARS-CoV-2 by FDA under an Emergency Use Authorization (EUA). This EUA will remain in effect (meaning this test can be used) for the duration of the COVID-19 declaration under Section 564(b)(1) of the Act, 21 U.S.C. section 360bbb-3(b)(1), unless the authorization is terminated or revoked.  Performed at [facility], [address] [place], [place] [address]   Urinalysis, w/ Reflex to Culture (Infection Suspected) -Urine, Clean Catch     Status: Abnormal   Collection Time: 09/05/22 11:27 PM  Result Value Ref Range   Specimen Source URINE, CATHETERIZED    Color, Urine STRAW (A) YELLOW   APPearance CLEAR CLEAR   Specific Gravity, Urine 1.033 (H) 1.005 - 1.030   pH 5.0 5.0 - 8.0   Glucose, UA >=500 (A) NEGATIVE mg/dL   Hgb urine dipstick MODERATE (A) NEGATIVE   Bilirubin Urine NEGATIVE NEGATIVE   Ketones, ur 20 (A) NEGATIVE mg/dL   Protein, ur 30 (A) NEGATIVE mg/dL   Nitrite NEGATIVE NEGATIVE   Leukocytes,Ua NEGATIVE NEGATIVE   RBC / HPF 0-5 0 - 5 RBC/hpf   WBC, UA 0-5 0 - 5 WBC/hpf    Comment:        Reflex urine culture not performed if WBC <=10, OR if Squamous epithelial cells >5. If Squamous epithelial cells >5 suggest recollection.    Bacteria, UA NONE SEEN NONE SEEN   Squamous Epithelial / HPF 0-5 0 -  5 /HPF    Comment: Performed at [facility], [address] [place], [place] [address]  Troponin I (High Sensitivity)     Status: None   Collection Time: 09/05/22 11:27 PM  Result Value Ref Range   Troponin I (High Sensitivity) 6 <18 ng/L    Comment: (NOTE) Elevated high sensitivity troponin I (hsTnI) values and significant  changes across serial measurements may suggest ACS but many other  chronic and acute conditions are known to elevate hsTnI results.  Refer to the "Links" section for chest pain algorithms and additional  guidance. Performed at [facility], [address], [place], [place] [address]   Comprehensive metabolic panel     Status: Abnormal   Collection Time: 09/05/22 11:35 PM  Result Value Ref Range   Sodium 133 (L) 135 - 145 mmol/L   Potassium 3.0 (L) 3.5 - 5.1 mmol/L   Chloride 100 98 - 111 mmol/L   CO2 20 (L) 22 - 32 mmol/L   Glucose, Bld 262 (H) 70 - 99 mg/dL    Comment: Glucose reference range applies only to samples taken after fasting for at least 8 hours.   BUN 13 6 - 20 mg/dL   Creatinine, Ser 6.57 0.61 - 1.24 mg/dL   Calcium 8.8 (L) 8.9 - 10.3 mg/dL   Total Protein 7.4 6.5 - 8.1 g/dL   Albumin 3.6 3.5 - 5.0 g/dL   AST 18 15 - 41 U/L   ALT 14 0 - 44 U/L   Alkaline Phosphatase 60 38 - 126 U/L   Total Bilirubin 1.2 0.3 - 1.2 mg/dL   GFR, Estimated >84 >69 mL/min    Comment: (NOTE) Calculated using the CKD-EPI Creatinine Equation (2021)    Anion gap 13 5 - 15    Comment: Performed at [facility], [address], [place], [place] [address]  CBC with Differential     Status: Abnormal   Collection Time: 09/05/22 11:35 PM  Result Value Ref Range   WBC 13.4 (H) 4.0 - 10.5 K/uL   RBC 4.59 4.22 - 5.81 MIL/uL   Hemoglobin 14.7 13.0 - 17.0 g/dL   HCT 84.1 32.4 - 40.1 %   MCV 92.8 80.0 - 100.0 fL   MCH 32.0 26.0 - 34.0 pg   MCHC 34.5 30.0 - 36.0 g/dL   RDW 02.7 25.3 - 66.4 %   Platelets 139 (L) 150 - 400 K/uL   nRBC 0.0 0.0 - 0.2 %   Neutrophils Relative % 89 %   Neutro Abs 12.0 (H) 1.7 - 7.7 K/uL   Lymphocytes Relative 3 %   Lymphs Abs 0.4 (L) 0.7 - 4.0 K/uL   Monocytes Relative 7 %   Monocytes Absolute 0.9 0.1 - 1.0 K/uL   Eosinophils Relative 0 %   Eosinophils Absolute 0.0 0.0 - 0.5 K/uL   Basophils Relative 0 %   Basophils Absolute 0.0 0.0 - 0.1 K/uL   Immature Granulocytes 1 %   Abs Immature Granulocytes 0.07 0.00 - 0.07 K/uL    Comment: Performed at [facility], [address], [place], [place] [address]  Protime-INR     Status: Abnormal   Collection Time: 09/05/22 11:35 PM   Result Value Ref Range   Prothrombin Time 17.0 (H) 11.4 - 15.2 seconds   INR 1.4 (H) 0.8 - 1.2    Comment: (NOTE) INR goal varies based on device and disease states. Performed at [facility], [address], [place], [place] [address]   Lactic acid, plasma     Status: None   Collection Time: 09/05/22 11:50 PM  Result Value Ref Range   Lactic Acid, Venous 1.9 0.5 - 1.9 mmol/L    Comment: Performed at [facility], [address] [place], [place] [address]  Lactic acid, plasma     Status: None   Collection Time: 09/06/22  1:45 AM  Result Value Ref Range   Lactic Acid, Venous 1.5 0.5 - 1.9 mmol/L    Comment: Performed at [facility], [address], [place], [place] [address]  Troponin I (High Sensitivity)     Status: None   Collection Time: 09/06/22  1:45 AM  Result Value Ref Range   Troponin I (High Sensitivity) 6 <18 ng/L    Comment: (NOTE) Elevated high sensitivity troponin I (hsTnI) values and significant  changes across serial measurements may suggest ACS but many other  chronic and acute conditions are known to elevate hsTnI results.  Refer to the "Links" section for chest pain algorithms and additional  guidance. Performed at [facility], [address], [place], [place] [address]   Hemoglobin A1c     Status: Abnormal   Collection Time: 09/06/22  5:05 AM  Result Value Ref Range   Hgb A1c MFr Bld 6.8 (H) 4.8 - 5.6 %    Comment: (NOTE) Pre diabetes:          5.7%-6.4%  Diabetes:              >6.4%  Glycemic control for   <7.0% adults with diabetes    Mean Plasma Glucose 148.46 mg/dL    Comment: Performed at [facility], [address], [place], [place] [address]  CBC with Differential/Platelet     Status: Abnormal   Collection Time: 09/06/22  5:05 AM  Result Value Ref Range   WBC 11.7 (H) 4.0 - 10.5 K/uL   RBC 4.26 4.22 - 5.81 MIL/uL   Hemoglobin 13.3 13.0 - 17.0 g/dL   HCT  13.0 86.5 - 78.4 %   MCV 94.1 80.0 - 100.0 fL   MCH 31.2 26.0 - 34.0 pg   MCHC 33.2 30.0 - 36.0 g/dL   RDW 69.6 29.5 - 28.4 %   Platelets 135 (L) 150 - 400 K/uL   nRBC 0.0 0.0 - 0.2 %   Neutrophils Relative % 79 %   Neutro Abs 9.3 (H) 1.7 - 7.7 K/uL   Lymphocytes Relative 10 %   Lymphs Abs 1.1 0.7 - 4.0 K/uL   Monocytes Relative 10 %   Monocytes Absolute 1.1 (H) 0.1 - 1.0 K/uL   Eosinophils Relative 1 %   Eosinophils Absolute 0.1 0.0 - 0.5 K/uL   Basophils Relative 0 %   Basophils Absolute 0.0 0.0 - 0.1 K/uL   Immature Granulocytes 0 %   Abs Immature Granulocytes 0.04 0.00 - 0.07 K/uL    Comment: Performed at [facility], [address] [place], [place] [address]  Basic metabolic panel     Status: Abnormal   Collection Time: 09/06/22  5:05 AM  Result Value Ref Range   Sodium 136 135 - 145 mmol/L   Potassium 3.5 3.5 - 5.1 mmol/L   Chloride 103 98 - 111 mmol/L   CO2 23 22 - 32 mmol/L   Glucose, Bld 114 (H) 70 - 99 mg/dL    Comment: Glucose reference range applies only to samples taken after fasting  for at least 8 hours.   BUN 10 6 - 20 mg/dL   Creatinine, Ser 5.36 0.61 - 1.24 mg/dL   Calcium 8.1 (L) 8.9 - 10.3 mg/dL   GFR, Estimated >64 >40 mL/min    Comment: (NOTE) Calculated using the CKD-EPI Creatinine Equation (2021)    Anion gap 10 5 - 15    Comment: Performed at [facility], [address] [place], [place] [address]  Magnesium     Status: None   Collection Time: 09/06/22  5:05 AM  Result Value Ref Range   Magnesium 1.8 1.7 - 2.4 mg/dL    Comment: Performed at [facility], [address] [place], [place] [address]   CT Angio Chest PE W/Cm &/Or Wo Cm  Result Date: 09/06/2022 CLINICAL DATA:  Pulmonary embolism (PE) suspected, high prob, syncope, tachycardia EXAM: CT ANGIOGRAPHY CHEST WITH CONTRAST TECHNIQUE: Multidetector CT imaging of the chest was performed using the standard protocol during bolus  administration of intravenous contrast. Multiplanar CT image reconstructions and MIPs were obtained to evaluate the vascular anatomy. RADIATION DOSE REDUCTION: This exam was performed according to the departmental dose-optimization program which includes automated exposure control, adjustment of the mA and/or kV according to patient size and/or use of iterative reconstruction technique. CONTRAST:  75mL OMNIPAQUE IOHEXOL 350 MG/ML SOLN COMPARISON:  None Available. FINDINGS: Cardiovascular: There is adequate opacification the pulmonary arterial tree. No intraluminal filling defect identified to suggest acute pulmonary embolism. The central pulmonary arteries are mildly enlarged in keeping with changes of pulmonary arterial hypertension. Moderate atherosclerotic calcification within the left anterior descending coronary artery. Global cardiac size within normal limits. No pericardial effusion. Minimal atherosclerotic calcification within the thoracic aorta. No aortic aneurysm. Mediastinum/Nodes: Shotty mediastinal and bilateral hilar adenopathy is nonspecific, likely reactive in nature. No frankly pathologic thoracic adenopathy identified. The esophagus is unremarkable. Lungs/Pleura: Multifocal airspace infiltrate is seen predominantly within the posterior right upper lobe but also scattered within the left lower lobe and lingula most in keeping with multifocal pneumonia in the acute setting. Associated bronchial wall thickening is present in keeping with airway inflammation. No pneumothorax or pleural effusion. No central obstructing lesion. Upper Abdomen: No acute abnormality. Musculoskeletal: No acute bone abnormality. No lytic or blastic bone lesion. Review of the MIP images confirms the above findings. IMPRESSION: 1. No pulmonary embolism. 2. Multifocal airspace infiltrate most in keeping with multifocal pneumonia in the acute setting. Associated bronchial wall thickening in keeping with airway inflammation. 3.  Moderate coronary artery calcification. 4. Morphologic changes in keeping with pulmonary arterial hypertension. 5.  Aortic Atherosclerosis (ICD10-I70.0). Electronically Signed   By: Helyn Numbers M.D.   On: 09/06/2022 01:32   CT Head Wo Contrast  Result Date: 09/06/2022 CLINICAL DATA:  Trauma. EXAM: CT HEAD WITHOUT CONTRAST CT CERVICAL SPINE WITHOUT CONTRAST TECHNIQUE: Multidetector CT imaging of the head and cervical spine was performed following the standard protocol without intravenous contrast. Multiplanar CT image reconstructions of the cervical spine were also generated. RADIATION DOSE REDUCTION: This exam was performed according to the departmental dose-optimization program which includes automated exposure control, adjustment of the mA and/or kV according to patient size and/or use of iterative reconstruction technique. COMPARISON:  None Available. FINDINGS: CT HEAD FINDINGS Brain: Mild age-related atrophy and chronic microvascular ischemic changes. There is no acute intracranial hemorrhage. No mass effect or midline shift. No extra-axial fluid collection. Vascular: No hyperdense vessel or unexpected calcification. Skull: Normal. Negative for fracture or focal lesion. Sinuses/Orbits: Mild mucoperiosteal thickening of  paranasal sinuses. No air-fluid level. The mastoid air cells are clear. Right scleral banding. Other: None CT CERVICAL SPINE FINDINGS Alignment: No acute subluxation. Skull base and vertebrae: No acute fracture. Soft tissues and spinal canal: No prevertebral fluid or swelling. No visible canal hematoma. Disc levels:  No acute findings.  Degenerative changes. Upper chest: Negative. Other: Bilateral carotid bulb calcified plaques. IMPRESSION: 1. No acute intracranial pathology. Mild age-related atrophy and chronic microvascular ischemic changes. 2. No acute/traumatic cervical spine pathology. Electronically Signed   By: Elgie Collard M.D.   On: 09/06/2022 01:30   CT Cervical Spine Wo  Contrast  Result Date: 09/06/2022 CLINICAL DATA:  Trauma. EXAM: CT HEAD WITHOUT CONTRAST CT CERVICAL SPINE WITHOUT CONTRAST TECHNIQUE: Multidetector CT imaging of the head and cervical spine was performed following the standard protocol without intravenous contrast. Multiplanar CT image reconstructions of the cervical spine were also generated. RADIATION DOSE REDUCTION: This exam was performed according to the departmental dose-optimization program which includes automated exposure control, adjustment of the mA and/or kV according to patient size and/or use of iterative reconstruction technique. COMPARISON:  None Available. FINDINGS: CT HEAD FINDINGS Brain: Mild age-related atrophy and chronic microvascular ischemic changes. There is no acute intracranial hemorrhage. No mass effect or midline shift. No extra-axial fluid collection. Vascular: No hyperdense vessel or unexpected calcification. Skull: Normal. Negative for fracture or focal lesion. Sinuses/Orbits: Mild mucoperiosteal thickening of paranasal sinuses. No air-fluid level. The mastoid air cells are clear. Right scleral banding. Other: None CT CERVICAL SPINE FINDINGS Alignment: No acute subluxation. Skull base and vertebrae: No acute fracture. Soft tissues and spinal canal: No prevertebral fluid or swelling. No visible canal hematoma. Disc levels:  No acute findings.  Degenerative changes. Upper chest: Negative. Other: Bilateral carotid bulb calcified plaques. IMPRESSION: 1. No acute intracranial pathology. Mild age-related atrophy and chronic microvascular ischemic changes. 2. No acute/traumatic cervical spine pathology. Electronically Signed   By: Elgie Collard M.D.   On: 09/06/2022 01:30   DG Chest 2 View  Result Date: 09/05/2022 CLINICAL DATA:  Weakness, confusion, sore throat, fever EXAM: CHEST - 2 VIEW COMPARISON:  Chest radiograph 11/17/2015 FINDINGS: Low lung volumes accentuate cardiomediastinal silhouette and pulmonary vascularity. Bilateral  interstitial coarsening likely due to low lung volumes. There is focal hazy opacity in the right lower lobe consistent with pneumonia. No pleural effusion or pneumothorax. No displaced rib fractures. IMPRESSION: Right lower lobe pneumonia. Electronically Signed   By: Minerva Fester M.D.   On: 09/05/2022 23:50    Pending Labs Unresulted Labs (From admission, onward)     Start     Ordered   09/05/22 2307  Culture, blood (Routine x 2)  BLOOD CULTURE X 2,   R      09/05/22 2306            Vitals/Pain Today's Vitals   09/06/22 0417 09/06/22 0543 09/06/22 0600 09/06/22 0718  BP:   132/68 124/65  Pulse:   94 93  Resp: 17  (!) 0 18  Temp:  97.8 F (36.6 C)    TempSrc:  Oral    SpO2: 98%  96% 97%  Weight:      Height:      PainSc:        Isolation Precautions No active isolations  Medications Medications  0.9 % NaCl with KCl 20 mEq/ L  infusion ( Intravenous New Bag/Given 09/06/22 0532)  cefTRIAXone (ROCEPHIN) 1 g in sodium chloride 0.9 % 100 mL IVPB (has no administration in time range)  azithromycin (ZITHROMAX) 500 mg in sodium chloride 0.9 % 250 mL IVPB (has no administration in time range)  albuterol (PROVENTIL) (2.5 MG/3ML) 0.083% nebulizer solution 2.5 mg (has no administration in time range)  guaiFENesin (ROBITUSSIN) 100 MG/5ML liquid 5 mL (has no administration in time range)  acetaminophen (TYLENOL) tablet 650 mg (has no administration in time range)  insulin aspart (novoLOG) injection 0-15 Units ( Subcutaneous Not Given 09/06/22 0721)  insulin aspart (novoLOG) injection 0-5 Units (has no administration in time range)  ondansetron (ZOFRAN) injection 4 mg (has no administration in time range)  sodium chloride 0.9 % bolus 1,000 mL (0 mLs Intravenous Stopped 09/06/22 0147)  cefTRIAXone (ROCEPHIN) 1 g in sodium chloride 0.9 % 100 mL IVPB (0 g Intravenous Stopped 09/06/22 0058)  azithromycin (ZITHROMAX) 500 mg in sodium chloride 0.9 % 250 mL IVPB (0 mg Intravenous Stopped  09/06/22 0250)  potassium chloride SA (KLOR-CON M) CR tablet 40 mEq (40 mEq Oral Given 09/06/22 0030)  iohexol (OMNIPAQUE) 350 MG/ML injection 75 mL (75 mLs Intravenous Contrast Given 09/06/22 0055)  insulin glargine-yfgn (SEMGLEE) injection 10 Units (10 Units Subcutaneous Given 09/06/22 0412)  potassium chloride SA (KLOR-CON M) CR tablet 40 mEq (40 mEq Oral Given 09/06/22 0412)    Mobility walks with person assist

## 2022-09-06 NOTE — ED Notes (Signed)
Assisted with cleaning pt, changed linen and gown.

## 2022-09-06 NOTE — Progress Notes (Signed)
Sputum specimen cup with verbal instructions given to patient, cup @ bedside.

## 2022-09-06 NOTE — Sepsis Progress Note (Signed)
Following for sepsis monitoring ?

## 2022-09-06 NOTE — ED Notes (Signed)
ED TO INPATIENT HANDOFF REPORT  ED Nurse Name and Phone #: Braxten Memmer  S Name/Age/Gender Bradley Mcclure Deguia 62 y.o. male Room/Bed: WA16/WA16  Code Status   Code Status: Prior  Home/SNF/Other Home Patient oriented to: self, place, time, and situation Is this baseline? Yes   Triage Complete: Triage complete  Chief Complaint Multifocal pneumonia [J18.9]  Triage Note Pt BIB [facility] from work. EMS initially called out for witnessed syncopal episode with head strike. Pt denied LOC but coworkers report pt did have LOC. Xarelto on med list in chart. Pt was pale, diaphoretic and tachy in 130s upon EMS arrival. Pt refused transport to hospital at that time.  EMS was called back out about 45 minutes later for generalized weakness. Pt states that he has had a sinus infection x3 days - scratchy throat, nasal congestion, productive cough. Pt c/o soreness to back after fall. Denies n/v, dizziness, CP, SOB, headache.  EMS vitals: 114/68 HR 135 > 110 RR 28 SpO2 92% CBG 321, hx diabetes  Pt received approx 400 mL NS via EMS.    Allergies No Known Allergies  Level of Care/Admitting Diagnosis ED Disposition     ED Disposition  Admit   Condition  --   Comment  Hospital Area: [facility] [[id number]]  Level of Care: Progressive [102]  Admit to Progressive based on following criteria: MULTISYSTEM THREATS such as stable sepsis, metabolic/electrolyte imbalance with or without encephalopathy that is responding to early treatment.  Admit to Progressive based on following criteria: RESPIRATORY PROBLEMS hypoxemic/hypercapnic respiratory failure that is responsive to NIPPV (BiPAP) or High Flow Nasal Cannula (6-80 lpm). Frequent assessment/intervention, no > Q2 hrs < Q4 hrs, to maintain oxygenation and pulmonary hygiene.  May admit patient to [facility] or [facility] if equivalent level of care is available:: No  Covid Evaluation: Confirmed COVID Negative  Diagnosis: Multifocal  pneumonia [[id number]]  Admitting Physician: Bobette Mo [[id number]]  Attending Physician: Bobette Mo [[id number]]  Certification:: I certify this patient will need inpatient services for at least 2 midnights  Estimated Length of Stay: 2          B Medical/Surgery History Past Medical History:  Diagnosis Date   Balanitis 12/24/2015   Cellulitis and abscess 12/24/2015   Hypertension    IBS (irritable bowel syndrome)    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   Pneumonia 2014-2015 X 1   Type II diabetes mellitus (HCC)    Past Surgical History:  Procedure Laterality Date   TEE WITHOUT CARDIOVERSION N/A 11/17/2015   Procedure: TRANSESOPHAGEAL ECHOCARDIOGRAM (TEE);  Surgeon: Chilton Si, MD;  Location: [facility] ENDOSCOPY;  Service: Cardiovascular;  Laterality: N/A;   TONSILLECTOMY  ~ 1977     A IV Location/Drains/Wounds Patient Lines/Drains/Airways Status     Active Line/Drains/Airways     Name Placement date Placement time Site Days   Peripheral IV 09/05/22 18 G Left;Posterior Forearm 09/05/22  2305  Forearm  1   Peripheral IV 09/05/22 20 G Left;Posterior Hand 09/05/22  2320  Hand  1   External Urinary Catheter 09/06/22  0209  --  less than 1            Intake/Output Last 24 hours  Intake/Output Summary (Last 24 hours) at 09/06/2022 0738 Last data filed at 09/06/2022 0535 Gross per 24 hour  Intake 1250.57 ml  Output 650 ml  Net 600.57 ml    Labs/Imaging Results for orders placed or performed during the hospital encounter of 09/05/22 (from the past 48 hour(s))  Brain natriuretic peptide     Status: None   Collection Time: 09/05/22 12:59 AM  Result Value Ref Range   B Natriuretic Peptide 61.7 0.0 - 100.0 pg/mL    Comment: Performed at [facility], [address], [place], [place] [address]  Resp panel by RT-PCR (RSV, Flu A&B, Covid) Anterior Nasal Swab     Status: None   Collection Time: 09/05/22 11:17 PM   Specimen: Anterior Nasal Swab  Result Value  Ref Range   SARS Coronavirus 2 by RT PCR NEGATIVE NEGATIVE    Comment: (NOTE) SARS-CoV-2 target nucleic acids are NOT DETECTED.  The SARS-CoV-2 RNA is generally detectable in upper respiratory specimens during the acute phase of infection. The lowest concentration of SARS-CoV-2 viral copies this assay can detect is 138 copies/mL. A negative result does not preclude SARS-Cov-2 infection and should not be used as the sole basis for treatment or other patient management decisions. A negative result may occur with  improper specimen collection/handling, submission of specimen other than nasopharyngeal swab, presence of viral mutation(s) within the areas targeted by this assay, and inadequate number of viral copies(<138 copies/mL). A negative result must be combined with clinical observations, patient history, and epidemiological information. The expected result is Negative.  Fact Sheet for Patients:  BloggerCourse.com  Fact Sheet for Healthcare Providers:  SeriousBroker.it  This test is no t yet approved or cleared by the [place] FDA and  has been authorized for detection and/or diagnosis of SARS-CoV-2 by FDA under an Emergency Use Authorization (EUA). This EUA will remain  in effect (meaning this test can be used) for the duration of the COVID-19 declaration under Section 564(b)(1) of the Act, 21 U.S.C.section 360bbb-3(b)(1), unless the authorization is terminated  or revoked sooner.       Influenza A by PCR NEGATIVE NEGATIVE   Influenza B by PCR NEGATIVE NEGATIVE    Comment: (NOTE) The Xpert Xpress SARS-CoV-2/FLU/RSV plus assay is intended as an aid in the diagnosis of influenza from Nasopharyngeal swab specimens and should not be used as a sole basis for treatment. Nasal washings and aspirates are unacceptable for Xpert Xpress SARS-CoV-2/FLU/RSV testing.  Fact Sheet for  Patients: BloggerCourse.com  Fact Sheet for Healthcare Providers: SeriousBroker.it  This test is not yet approved or cleared by the [place] FDA and has been authorized for detection and/or diagnosis of SARS-CoV-2 by FDA under an Emergency Use Authorization (EUA). This EUA will remain in effect (meaning this test can be used) for the duration of the COVID-19 declaration under Section 564(b)(1) of the Act, 21 U.S.C. section 360bbb-3(b)(1), unless the authorization is terminated or revoked.     Resp Syncytial Virus by PCR NEGATIVE NEGATIVE    Comment: (NOTE) Fact Sheet for Patients: BloggerCourse.com  Fact Sheet for Healthcare Providers: SeriousBroker.it  This test is not yet approved or cleared by the [place] FDA and has been authorized for detection and/or diagnosis of SARS-CoV-2 by FDA under an Emergency Use Authorization (EUA). This EUA will remain in effect (meaning this test can be used) for the duration of the COVID-19 declaration under Section 564(b)(1) of the Act, 21 U.S.C. section 360bbb-3(b)(1), unless the authorization is terminated or revoked.  Performed at [facility], [address] [place], [place] [address]   Urinalysis, w/ Reflex to Culture (Infection Suspected) -Urine, Clean Catch     Status: Abnormal   Collection Time: 09/05/22 11:27 PM  Result Value Ref Range   Specimen Source URINE, CATHETERIZED  Color, Urine STRAW (A) YELLOW   APPearance CLEAR CLEAR   Specific Gravity, Urine 1.033 (H) 1.005 - 1.030   pH 5.0 5.0 - 8.0   Glucose, UA >=500 (A) NEGATIVE mg/dL   Hgb urine dipstick MODERATE (A) NEGATIVE   Bilirubin Urine NEGATIVE NEGATIVE   Ketones, ur 20 (A) NEGATIVE mg/dL   Protein, ur 30 (A) NEGATIVE mg/dL   Nitrite NEGATIVE NEGATIVE   Leukocytes,Ua NEGATIVE NEGATIVE   RBC / HPF 0-5 0 - 5 RBC/hpf   WBC, UA 0-5 0 - 5  WBC/hpf    Comment:        Reflex urine culture not performed if WBC <=10, OR if Squamous epithelial cells >5. If Squamous epithelial cells >5 suggest recollection.    Bacteria, UA NONE SEEN NONE SEEN   Squamous Epithelial / HPF 0-5 0 - 5 /HPF    Comment: Performed at [facility], [address], [place], [place] [address]  Troponin I (High Sensitivity)     Status: None   Collection Time: 09/05/22 11:27 PM  Result Value Ref Range   Troponin I (High Sensitivity) 6 <18 ng/L    Comment: (NOTE) Elevated high sensitivity troponin I (hsTnI) values and significant  changes across serial measurements may suggest ACS but many other  chronic and acute conditions are known to elevate hsTnI results.  Refer to the "Links" section for chest pain algorithms and additional  guidance. Performed at [facility], [address], [place], [place] [address]   Comprehensive metabolic panel     Status: Abnormal   Collection Time: 09/05/22 11:35 PM  Result Value Ref Range   Sodium 133 (L) 135 - 145 mmol/L   Potassium 3.0 (L) 3.5 - 5.1 mmol/L   Chloride 100 98 - 111 mmol/L   CO2 20 (L) 22 - 32 mmol/L   Glucose, Bld 262 (H) 70 - 99 mg/dL    Comment: Glucose reference range applies only to samples taken after fasting for at least 8 hours.   BUN 13 6 - 20 mg/dL   Creatinine, Ser 0.98 0.61 - 1.24 mg/dL   Calcium 8.8 (L) 8.9 - 10.3 mg/dL   Total Protein 7.4 6.5 - 8.1 g/dL   Albumin 3.6 3.5 - 5.0 g/dL   AST 18 15 - 41 U/L   ALT 14 0 - 44 U/L   Alkaline Phosphatase 60 38 - 126 U/L   Total Bilirubin 1.2 0.3 - 1.2 mg/dL   GFR, Estimated >11 >91 mL/min    Comment: (NOTE) Calculated using the CKD-EPI Creatinine Equation (2021)    Anion gap 13 5 - 15    Comment: Performed at [facility], [address] [place], [place] [address]  CBC with Differential     Status: Abnormal   Collection Time: 09/05/22 11:35 PM  Result Value Ref Range   WBC 13.4  (H) 4.0 - 10.5 K/uL   RBC 4.59 4.22 - 5.81 MIL/uL   Hemoglobin 14.7 13.0 - 17.0 g/dL   HCT 56.2 13.0 - 86.5 %   MCV 92.8 80.0 - 100.0 fL   MCH 32.0 26.0 - 34.0 pg   MCHC 34.5 30.0 - 36.0 g/dL   RDW 78.4 69.6 - 29.5 %   Platelets 139 (L) 150 - 400 K/uL   nRBC 0.0 0.0 - 0.2 %   Neutrophils Relative % 89 %   Neutro Abs 12.0 (H) 1.7 - 7.7 K/uL   Lymphocytes Relative 3 %   Lymphs Abs  0.4 (L) 0.7 - 4.0 K/uL   Monocytes Relative 7 %   Monocytes Absolute 0.9 0.1 - 1.0 K/uL   Eosinophils Relative 0 %   Eosinophils Absolute 0.0 0.0 - 0.5 K/uL   Basophils Relative 0 %   Basophils Absolute 0.0 0.0 - 0.1 K/uL   Immature Granulocytes 1 %   Abs Immature Granulocytes 0.07 0.00 - 0.07 K/uL    Comment: Performed at [facility], [address], [place], [place] [address]  Protime-INR     Status: Abnormal   Collection Time: 09/05/22 11:35 PM  Result Value Ref Range   Prothrombin Time 17.0 (H) 11.4 - 15.2 seconds   INR 1.4 (H) 0.8 - 1.2    Comment: (NOTE) INR goal varies based on device and disease states. Performed at [facility], [address] [place], [place] [address]   Lactic acid, plasma     Status: None   Collection Time: 09/05/22 11:50 PM  Result Value Ref Range   Lactic Acid, Venous 1.9 0.5 - 1.9 mmol/L    Comment: Performed at [facility], [address] [place], [place] [address]  Lactic acid, plasma     Status: None   Collection Time: 09/06/22  1:45 AM  Result Value Ref Range   Lactic Acid, Venous 1.5 0.5 - 1.9 mmol/L    Comment: Performed at [facility], [address], [place], [place] [address]  Troponin I (High Sensitivity)     Status: None   Collection Time: 09/06/22  1:45 AM  Result Value Ref Range   Troponin I (High Sensitivity) 6 <18 ng/L    Comment: (NOTE) Elevated high sensitivity troponin I (hsTnI) values and significant  changes across serial measurements may suggest ACS but many  other  chronic and acute conditions are known to elevate hsTnI results.  Refer to the "Links" section for chest pain algorithms and additional  guidance. Performed at [facility], [address], [place], [place] [address]   Hemoglobin A1c     Status: Abnormal   Collection Time: 09/06/22  5:05 AM  Result Value Ref Range   Hgb A1c MFr Bld 6.8 (H) 4.8 - 5.6 %    Comment: (NOTE) Pre diabetes:          5.7%-6.4%  Diabetes:              >6.4%  Glycemic control for   <7.0% adults with diabetes    Mean Plasma Glucose 148.46 mg/dL    Comment: Performed at [facility], [address] [place], [place] [address]  CBC with Differential/Platelet     Status: Abnormal   Collection Time: 09/06/22  5:05 AM  Result Value Ref Range   WBC 11.7 (H) 4.0 - 10.5 K/uL   RBC 4.26 4.22 - 5.81 MIL/uL   Hemoglobin 13.3 13.0 - 17.0 g/dL   HCT 57.8 46.9 - 62.9 %   MCV 94.1 80.0 - 100.0 fL   MCH 31.2 26.0 - 34.0 pg   MCHC 33.2 30.0 - 36.0 g/dL   RDW 52.8 41.3 - 24.4 %   Platelets 135 (L) 150 - 400 K/uL   nRBC 0.0 0.0 - 0.2 %   Neutrophils Relative % 79 %   Neutro Abs 9.3 (H) 1.7 - 7.7 K/uL   Lymphocytes Relative 10 %   Lymphs Abs 1.1 0.7 - 4.0 K/uL   Monocytes Relative 10 %   Monocytes Absolute 1.1 (H) 0.1 -  1.0 K/uL   Eosinophils Relative 1 %   Eosinophils Absolute 0.1 0.0 - 0.5 K/uL   Basophils Relative 0 %   Basophils Absolute 0.0 0.0 - 0.1 K/uL   Immature Granulocytes 0 %   Abs Immature Granulocytes 0.04 0.00 - 0.07 K/uL    Comment: Performed at [facility], [address], [place], [place] [address]  Basic metabolic panel     Status: Abnormal   Collection Time: 09/06/22  5:05 AM  Result Value Ref Range   Sodium 136 135 - 145 mmol/L   Potassium 3.5 3.5 - 5.1 mmol/L   Chloride 103 98 - 111 mmol/L   CO2 23 22 - 32 mmol/L   Glucose, Bld 114 (H) 70 - 99 mg/dL    Comment: Glucose reference range applies only to samples taken after fasting for at  least 8 hours.   BUN 10 6 - 20 mg/dL   Creatinine, Ser 6.04 0.61 - 1.24 mg/dL   Calcium 8.1 (L) 8.9 - 10.3 mg/dL   GFR, Estimated >54 >09 mL/min    Comment: (NOTE) Calculated using the CKD-EPI Creatinine Equation (2021)    Anion gap 10 5 - 15    Comment: Performed at [facility], [address], [place], [place] [address]  Magnesium     Status: None   Collection Time: 09/06/22  5:05 AM  Result Value Ref Range   Magnesium 1.8 1.7 - 2.4 mg/dL    Comment: Performed at [facility], [address], [place], [place] [address]   CT Angio Chest PE W/Cm &/Or Wo Cm  Result Date: 09/06/2022 CLINICAL DATA:  Pulmonary embolism (PE) suspected, high prob, syncope, tachycardia EXAM: CT ANGIOGRAPHY CHEST WITH CONTRAST TECHNIQUE: Multidetector CT imaging of the chest was performed using the standard protocol during bolus administration of intravenous contrast. Multiplanar CT image reconstructions and MIPs were obtained to evaluate the vascular anatomy. RADIATION DOSE REDUCTION: This exam was performed according to the departmental dose-optimization program which includes automated exposure control, adjustment of the mA and/or kV according to patient size and/or use of iterative reconstruction technique. CONTRAST:  75mL OMNIPAQUE IOHEXOL 350 MG/ML SOLN COMPARISON:  None Available. FINDINGS: Cardiovascular: There is adequate opacification the pulmonary arterial tree. No intraluminal filling defect identified to suggest acute pulmonary embolism. The central pulmonary arteries are mildly enlarged in keeping with changes of pulmonary arterial hypertension. Moderate atherosclerotic calcification within the left anterior descending coronary artery. Global cardiac size within normal limits. No pericardial effusion. Minimal atherosclerotic calcification within the thoracic aorta. No aortic aneurysm. Mediastinum/Nodes: Shotty mediastinal and bilateral hilar adenopathy is nonspecific,  likely reactive in nature. No frankly pathologic thoracic adenopathy identified. The esophagus is unremarkable. Lungs/Pleura: Multifocal airspace infiltrate is seen predominantly within the posterior right upper lobe but also scattered within the left lower lobe and lingula most in keeping with multifocal pneumonia in the acute setting. Associated bronchial wall thickening is present in keeping with airway inflammation. No pneumothorax or pleural effusion. No central obstructing lesion. Upper Abdomen: No acute abnormality. Musculoskeletal: No acute bone abnormality. No lytic or blastic bone lesion. Review of the MIP images confirms the above findings. IMPRESSION: 1. No pulmonary embolism. 2. Multifocal airspace infiltrate most in keeping with multifocal pneumonia in the acute setting. Associated bronchial wall thickening in keeping with airway inflammation. 3. Moderate coronary artery calcification. 4. Morphologic changes in keeping with pulmonary arterial hypertension. 5.  Aortic Atherosclerosis (ICD10-I70.0). Electronically Signed   By: Lyda Kalata.D.  On: 09/06/2022 01:32   CT Head Wo Contrast  Result Date: 09/06/2022 CLINICAL DATA:  Trauma. EXAM: CT HEAD WITHOUT CONTRAST CT CERVICAL SPINE WITHOUT CONTRAST TECHNIQUE: Multidetector CT imaging of the head and cervical spine was performed following the standard protocol without intravenous contrast. Multiplanar CT image reconstructions of the cervical spine were also generated. RADIATION DOSE REDUCTION: This exam was performed according to the departmental dose-optimization program which includes automated exposure control, adjustment of the mA and/or kV according to patient size and/or use of iterative reconstruction technique. COMPARISON:  None Available. FINDINGS: CT HEAD FINDINGS Brain: Mild age-related atrophy and chronic microvascular ischemic changes. There is no acute intracranial hemorrhage. No mass effect or midline shift. No extra-axial fluid  collection. Vascular: No hyperdense vessel or unexpected calcification. Skull: Normal. Negative for fracture or focal lesion. Sinuses/Orbits: Mild mucoperiosteal thickening of paranasal sinuses. No air-fluid level. The mastoid air cells are clear. Right scleral banding. Other: None CT CERVICAL SPINE FINDINGS Alignment: No acute subluxation. Skull base and vertebrae: No acute fracture. Soft tissues and spinal canal: No prevertebral fluid or swelling. No visible canal hematoma. Disc levels:  No acute findings.  Degenerative changes. Upper chest: Negative. Other: Bilateral carotid bulb calcified plaques. IMPRESSION: 1. No acute intracranial pathology. Mild age-related atrophy and chronic microvascular ischemic changes. 2. No acute/traumatic cervical spine pathology. Electronically Signed   By: Elgie Collard M.D.   On: 09/06/2022 01:30   CT Cervical Spine Wo Contrast  Result Date: 09/06/2022 CLINICAL DATA:  Trauma. EXAM: CT HEAD WITHOUT CONTRAST CT CERVICAL SPINE WITHOUT CONTRAST TECHNIQUE: Multidetector CT imaging of the head and cervical spine was performed following the standard protocol without intravenous contrast. Multiplanar CT image reconstructions of the cervical spine were also generated. RADIATION DOSE REDUCTION: This exam was performed according to the departmental dose-optimization program which includes automated exposure control, adjustment of the mA and/or kV according to patient size and/or use of iterative reconstruction technique. COMPARISON:  None Available. FINDINGS: CT HEAD FINDINGS Brain: Mild age-related atrophy and chronic microvascular ischemic changes. There is no acute intracranial hemorrhage. No mass effect or midline shift. No extra-axial fluid collection. Vascular: No hyperdense vessel or unexpected calcification. Skull: Normal. Negative for fracture or focal lesion. Sinuses/Orbits: Mild mucoperiosteal thickening of paranasal sinuses. No air-fluid level. The mastoid air cells are  clear. Right scleral banding. Other: None CT CERVICAL SPINE FINDINGS Alignment: No acute subluxation. Skull base and vertebrae: No acute fracture. Soft tissues and spinal canal: No prevertebral fluid or swelling. No visible canal hematoma. Disc levels:  No acute findings.  Degenerative changes. Upper chest: Negative. Other: Bilateral carotid bulb calcified plaques. IMPRESSION: 1. No acute intracranial pathology. Mild age-related atrophy and chronic microvascular ischemic changes. 2. No acute/traumatic cervical spine pathology. Electronically Signed   By: Elgie Collard M.D.   On: 09/06/2022 01:30   DG Chest 2 View  Result Date: 09/05/2022 CLINICAL DATA:  Weakness, confusion, sore throat, fever EXAM: CHEST - 2 VIEW COMPARISON:  Chest radiograph 11/17/2015 FINDINGS: Low lung volumes accentuate cardiomediastinal silhouette and pulmonary vascularity. Bilateral interstitial coarsening likely due to low lung volumes. There is focal hazy opacity in the right lower lobe consistent with pneumonia. No pleural effusion or pneumothorax. No displaced rib fractures. IMPRESSION: Right lower lobe pneumonia. Electronically Signed   By: Minerva Fester M.D.   On: 09/05/2022 23:50    Pending Labs Unresulted Labs (From admission, onward)     Start     Ordered   09/05/22 2307  Culture, blood (Routine x 2)  BLOOD CULTURE X 2,   R      09/05/22 2306            Vitals/Pain Today's Vitals   09/06/22 0417 09/06/22 0543 09/06/22 0600 09/06/22 0718  BP:   132/68 124/65  Pulse:   94 93  Resp: 17  (!) 0 18  Temp:  97.8 F (36.6 C)    TempSrc:  Oral    SpO2: 98%  96% 97%  Weight:      Height:      PainSc:        Isolation Precautions No active isolations  Medications Medications  0.9 % NaCl with KCl 20 mEq/ L  infusion ( Intravenous New Bag/Given 09/06/22 0532)  cefTRIAXone (ROCEPHIN) 1 g in sodium chloride 0.9 % 100 mL IVPB (has no administration in time range)  azithromycin (ZITHROMAX) 500 mg in sodium  chloride 0.9 % 250 mL IVPB (has no administration in time range)  albuterol (PROVENTIL) (2.5 MG/3ML) 0.083% nebulizer solution 2.5 mg (has no administration in time range)  guaiFENesin (ROBITUSSIN) 100 MG/5ML liquid 5 mL (has no administration in time range)  acetaminophen (TYLENOL) tablet 650 mg (has no administration in time range)  insulin aspart (novoLOG) injection 0-15 Units ( Subcutaneous Not Given 09/06/22 0721)  insulin aspart (novoLOG) injection 0-5 Units (has no administration in time range)  ondansetron (ZOFRAN) injection 4 mg (has no administration in time range)  sodium chloride 0.9 % bolus 1,000 mL (0 mLs Intravenous Stopped 09/06/22 0147)  cefTRIAXone (ROCEPHIN) 1 g in sodium chloride 0.9 % 100 mL IVPB (0 g Intravenous Stopped 09/06/22 0058)  azithromycin (ZITHROMAX) 500 mg in sodium chloride 0.9 % 250 mL IVPB (0 mg Intravenous Stopped 09/06/22 0250)  potassium chloride SA (KLOR-CON M) CR tablet 40 mEq (40 mEq Oral Given 09/06/22 0030)  iohexol (OMNIPAQUE) 350 MG/ML injection 75 mL (75 mLs Intravenous Contrast Given 09/06/22 0055)  insulin glargine-yfgn (SEMGLEE) injection 10 Units (10 Units Subcutaneous Given 09/06/22 0412)  potassium chloride SA (KLOR-CON M) CR tablet 40 mEq (40 mEq Oral Given 09/06/22 0412)    Mobility walks     Focused Assessments    R Recommendations: See Admitting Provider Note  Report given to:   Additional Notes:

## 2022-09-06 NOTE — H&P (Signed)
History and Physical    Patient: Bradley Mcclure:[id number] DOB: [birth year] DOA: 09/05/2022 DOS: the patient was seen and examined on 09/06/2022 PCP: [facility]  Patient coming from: Home  Chief Complaint:  Chief Complaint  Patient presents with   Loss of Consciousness   Weakness   Tachycardia   HPI: Bradley Mcclure is a 62 y.o. male with medical history significant of balanitis, Candida induced panniculitis, cellulitis and abscess, MRSA bacteremia, GERD, retinal detachment, anxiety, depression, mild cognitive disorder, hypertension, hyperlipidemia, overweight, type 2 diabetes, nonalcoholic fatty liver disease, previous morbid obesity, history of pulmonary embolism on rivaroxaban, history of pneumonia who presented to the emergency department via EMS due to syncopal episode with injury to the head.  He initially refused to come to the hospital but EMS was called back about 45 minutes later due to generalized weakness.  He is stated that the previous week he had URI symptoms got progressively worse.  He has felt warm, has had night sweats, fatigue and malaise.  Yesterday evening, he tried to pick something up from the floor and fell forward hitting his head, but denied loss of consciousness.  No chest pain, palpitations, diaphoresis, PND, orthopnea or pitting edema of the lower extremities.  No abdominal pain, nausea, emesis, diarrhea, constipation, melena or hematochezia.  No flank pain, dysuria, frequency or hematuria.  No polyuria, polydipsia, polyphagia or blurred vision.   Lab work: Urinalysis showed a straw color, increase a specific gravity 1.0 33, glucose of more than 500, ketones of 20 and protein of 30 mg deciliter.  There was moderate hemoglobinuria.  Unremarkable microscopic examination.  CBC showed a white count of 13.4 with 89% neutrophils, hemoglobin 14.7 g deciliter platelets 139.  PT 17.0 and INR 1.4.  Lactic acid, troponin x 2 and BNP normal.  CMP showed a  sodium 133, potassium 3.0, chloride 100 and CO2 20 mmol/L with a normal anion gap.  Glucose is 262 mg/dL.  Renal and hepatic functions were normal.  Calcium is normal after correction.  Imaging: 2 view chest radiograph showing right lower pneumonia.  CT head without contrast with no acute intracranial pathology.  Mild age-related atrophy and chronic microvascular ischemic changes.  CT cervical spine with no acute/traumatic cervical spine pathology.  CTA chest with no PE.  There was multifocal airspace infiltrate with associated bronchial wall thickening.  Moderate coronary artery calcification.  Morphology changes in keeping with pulmonary arterial hypertension.  Aortic atherosclerosis.  ED course: Initial vital signs were temperature 98.8 F, pulse 110, respiration 20, BP 99/53 mmHg O2 sat 92% on room air.  The patient received 1000 mL normal saline bolus, KCl 40 mEq p.o. x 2 Semglee 10 units SQ, azithromycin 500 mg IVPB and ceftriaxone 1 g IVPB.  Review of Systems: As mentioned in the history of present illness. All other systems reviewed and are negative.  Past Medical History:  Diagnosis Date   Balanitis 12/24/2015   Cellulitis and abscess 12/24/2015   Hypertension    IBS (irritable bowel syndrome)    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   Pneumonia 2014-2015 X 1   Type II diabetes mellitus (HCC)    Past Surgical History:  Procedure Laterality Date   TEE WITHOUT CARDIOVERSION N/A 11/17/2015   Procedure: TRANSESOPHAGEAL ECHOCARDIOGRAM (TEE);  Surgeon: Chilton Si, MD;  Location: [facility] ENDOSCOPY;  Service: Cardiovascular;  Laterality: N/A;   TONSILLECTOMY  ~ 1977   Social History:  reports that he has never smoked. He has never used smokeless tobacco. He reports  that he does not drink alcohol and does not use drugs.  No Known Allergies  Family History  Problem Relation Age of Onset   Myasthenia gravis Mother    Diabetes Mellitus I Brother     Prior to Admission medications   Medication Sig Start  Date End Date Taking? Authorizing Provider  acetaminophen (TYLENOL) 325 MG tablet Take 650 mg by mouth every 6 (six) hours as needed for mild pain.     [provider]  Cholecalciferol (VITAMIN D-1000 MAX ST) 1000 units tablet Take 2,000 Units by mouth daily.    [provider]  glucose blood test strip 1 each by Other route as needed for other. Use as instructed    [provider]  HYDROcodone-acetaminophen (NORCO/VICODIN) 5-325 MG tablet Take 1-2 tablets by mouth every 6 (six) hours as needed for moderate pain. 03/01/16   Althia Forts, MD  insulin glargine (LANTUS) 100 UNIT/ML injection Inject 0.75 mLs (75 Units total) into the skin 2 (two) times daily. Patient taking differently: Inject 90 Units into the skin 2 (two) times daily.  11/17/15   Vassie Loll, MD  Liraglutide (VICTOZA) 18 MG/3ML SOPN Inject 1.8 mg into the skin daily.     [provider]  losartan (COZAAR) 50 MG tablet Take 50 mg by mouth daily.    [provider]  metFORMIN (GLUCOPHAGE) 500 MG tablet Take 1,000 mg by mouth 2 (two) times daily with a meal.     [provider]  Multiple Vitamin (MULTIVITAMIN) tablet Take 1 tablet by mouth daily.    [provider]  mupirocin ointment (BACTROBAN) 2 % Place 1 application into the nose 2 (two) times daily. 12/24/15   Randall Hiss, MD  polyethylene glycol [facility] Ethelene Hal) packet Take 17 g by mouth daily. 03/02/16   Althia Forts, MD  pravastatin (PRAVACHOL) 40 MG tablet Take 40 mg by mouth daily.    [provider]  rivaroxaban (XARELTO) 20 MG TABS tablet Take 20 mg by mouth daily with supper.    [provider]  senna (SENOKOT) 8.6 MG TABS tablet Take 2 tablets (17.2 mg total) by mouth 2 (two) times daily. 03/01/16   Althia Forts, MD  silver sulfADIAZINE (SILVADENE) 1 % cream Apply topically daily. 03/02/16   Althia Forts, MD  Skin Protectants, Misc. (INTERDRY AG TEXTILE 16"X096") SHEE Apply 2  packets topically once a week. 11/17/15   Vassie Loll, MD  sulfamethoxazole-trimethoprim (BACTRIM DS,SEPTRA DS) 800-160 MG tablet Take 2 tablets by mouth every 12 (twelve) hours. 03/01/16   Althia Forts, MD    Physical Exam: Vitals:   09/06/22 0400 09/06/22 0417 09/06/22 0543 09/06/22 0600  BP: 122/63   132/68  Pulse: 86   94  Resp: 19 17  (!) 0  Temp:   97.8 F (36.6 C)   TempSrc:   Oral   SpO2: 98% 98%  96%  Weight:      Height:       Physical Exam Vitals and nursing note reviewed.  Constitutional:      General: He is awake. He is not in acute distress.    Appearance: Normal appearance.  HENT:     Head: Normocephalic.     Nose: No rhinorrhea.     Mouth/Throat:     Mouth: Mucous membranes are moist.  Eyes:     General: No scleral icterus.    Pupils: Pupils are equal, round, and reactive to light.  Neck:     Vascular:  No JVD.  Cardiovascular:     Rate and Rhythm: Normal rate and regular rhythm.     Heart sounds: S1 normal and S2 normal.  Pulmonary:     Effort: Pulmonary effort is normal.     Breath sounds: Normal breath sounds. No wheezing, rhonchi or rales.  Abdominal:     General: Bowel sounds are normal.     Palpations: Abdomen is soft.     Tenderness: There is no abdominal tenderness.  Musculoskeletal:     Cervical back: Neck supple.     Right lower leg: No edema.     Left lower leg: No edema.  Skin:    General: Skin is warm and dry.     Coloration: Skin is not jaundiced.  Neurological:     General: No focal deficit present.     Mental Status: He is alert and oriented to person, place, and time.  Psychiatric:        Mood and Affect: Mood normal.        Behavior: Behavior normal. Behavior is cooperative.   Data Reviewed:  There are no new results to review at this time.  Assessment and Plan: Principal Problem:   Multifocal pneumonia Admit to PCU/inpatient. Continue supplemental oxygen. Scheduled and as needed bronchodilators. Continue ceftriaxone  1 g IVPB daily. Continue azithromycin 500 mg IVPB daily. Check strep pneumoniae urinary antigen. Check sputum Gram stain, culture and sensitivity. Follow-up blood culture and sensitivity. Follow-up CBC and chemistry in the morning.  Active Problems:   Pulmonary embolism Continue Xarelto 20 mg p.o. daily.    Hyperlipidemia Currently not on medical therapy.    Major depressive disorder Continue Abilify 2 mg p.o. daily.    Essential hypertension Continue losartan 50 mg p.o. daily.    Type 2 diabetes mellitus (HCC) Continue Jardiance 25 mg p.o. daily. Continue metformin 1000 mg p.o. twice daily. CBG monitoring with RI SS.    GERD (gastroesophageal reflux disease) Antiacid, H2 blocker or PPI as needed.      Advance Care Planning:   Code Status: Full Code   Consults:   Family Communication:   Severity of Illness: The appropriate patient status for this patient is INPATIENT. Inpatient status is judged to be reasonable and necessary in order to provide the required intensity of service to ensure the patient's safety. The patient's presenting symptoms, physical exam findings, and initial radiographic and laboratory data in the context of their chronic comorbidities is felt to place them at high risk for further clinical deterioration. Furthermore, it is not anticipated that the patient will be medically stable for discharge from the hospital within 2 midnights of admission.   * I certify that at the point of admission it is my clinical judgment that the patient will require inpatient hospital care spanning beyond 2 midnights from the point of admission due to high intensity of service, high risk for further deterioration and high frequency of surveillance required.*  Author: Bobette Mo, MD 09/06/2022 7:16 AM  For on call review [email or web address].   This document was prepared using Dragon voice recognition software and may contain some unintended transcription errors.

## 2022-09-07 ENCOUNTER — Inpatient Hospital Stay (HOSPITAL_COMMUNITY): Payer: Medicare PPO

## 2022-09-07 DIAGNOSIS — J189 Pneumonia, unspecified organism: Secondary | ICD-10-CM | POA: Diagnosis not present

## 2022-09-07 DIAGNOSIS — R55 Syncope and collapse: Secondary | ICD-10-CM

## 2022-09-07 LAB — CBC
HCT: 41.3 % (ref 39.0–52.0)
Hemoglobin: 13.9 g/dL (ref 13.0–17.0)
MCH: 32 pg (ref 26.0–34.0)
MCHC: 33.7 g/dL (ref 30.0–36.0)
MCV: 95.2 fL (ref 80.0–100.0)
Platelets: 126 10*3/uL — ABNORMAL LOW (ref 150–400)
RBC: 4.34 MIL/uL (ref 4.22–5.81)
RDW: 13.2 % (ref 11.5–15.5)
WBC: 11.4 10*3/uL — ABNORMAL HIGH (ref 4.0–10.5)
nRBC: 0 % (ref 0.0–0.2)

## 2022-09-07 LAB — GLUCOSE, CAPILLARY
Glucose-Capillary: 89 mg/dL (ref 70–99)
Glucose-Capillary: 92 mg/dL (ref 70–99)
Glucose-Capillary: 85 mg/dL (ref 70–99)
Glucose-Capillary: 103 mg/dL — ABNORMAL HIGH (ref 70–99)

## 2022-09-07 LAB — ECHOCARDIOGRAM COMPLETE
AR max vel: 3.33 cm2
AV Area VTI: 3.36 cm2
AV Area mean vel: 3.24 cm2
AV Mean grad: 4 mmHg
AV Peak grad: 6.8 mmHg
Ao pk vel: 1.3 m/s
Area-P 1/2: 4.41 cm2
Height: 73 in
S' Lateral: 2.7 cm
Weight: 3668.45 oz

## 2022-09-07 LAB — COMPREHENSIVE METABOLIC PANEL
ALT: 16 U/L (ref 0–44)
AST: 27 U/L (ref 15–41)
Albumin: 3.2 g/dL — ABNORMAL LOW (ref 3.5–5.0)
Alkaline Phosphatase: 60 U/L (ref 38–126)
Anion gap: 12 (ref 5–15)
BUN: 11 mg/dL (ref 6–20)
CO2: 21 mmol/L — ABNORMAL LOW (ref 22–32)
Calcium: 8.5 mg/dL — ABNORMAL LOW (ref 8.9–10.3)
Chloride: 104 mmol/L (ref 98–111)
Creatinine, Ser: 0.59 mg/dL — ABNORMAL LOW (ref 0.61–1.24)
GFR, Estimated: >60 mL/min (ref >60)
Glucose, Bld: 121 mg/dL — ABNORMAL HIGH (ref 70–99)
Potassium: 3.9 mmol/L (ref 3.5–5.1)
Sodium: 137 mmol/L (ref 135–145)
Total Bilirubin: 1.2 mg/dL (ref 0.3–1.2)
Total Protein: 7 g/dL (ref 6.5–8.1)

## 2022-09-07 LAB — HIV ANTIBODY (ROUTINE TESTING W REFLEX): HIV Screen 4th Generation wRfx: NONREACTIVE

## 2022-09-07 MED ORDER — IPRATROPIUM-ALBUTEROL 0.5-2.5 (3) MG/3ML IN SOLN
3.0000 mL | Freq: Four times a day (QID) | RESPIRATORY_TRACT | Status: DC
  Administered 2022-09-07: 3 mL via RESPIRATORY_TRACT
  Filled 2022-09-07: qty 3

## 2022-09-07 MED ORDER — IPRATROPIUM-ALBUTEROL 0.5-2.5 (3) MG/3ML IN SOLN
3.0000 mL | Freq: Three times a day (TID) | RESPIRATORY_TRACT | Status: DC
  Administered 2022-09-08 – 2022-09-09 (×3): 3 mL via RESPIRATORY_TRACT
  Filled 2022-09-07 (×4): qty 3

## 2022-09-07 MED ORDER — PERFLUTREN LIPID MICROSPHERE
1.0000 mL | INTRAVENOUS | Status: AC | PRN
  Administered 2022-09-07: 2 mL via INTRAVENOUS

## 2022-09-07 NOTE — Progress Notes (Signed)
PROGRESS NOTE  Bradley Mcclure ZOX:[id number] DOB: 1960-11-30 DOA: 09/05/2022 PCP: [facility]   LOS: 1 day   Brief Narrative / Interim history: 62 year old male with prior history of MRSA bacteremia, anxiety, depression, mild cognitive disorder, HTN, HLD, DM2, NAFLD, prior PE on chronic anticoagulation, obesity comes to the hospital with several days of URI type symptoms, cough, chest congestion and eventually passing out.  In the ED he was found to have right lower lobe pneumonia, he was started on antibiotics and admitted to the hospital  Subjective / 24h Interval events: Eating breakfast.  Continues to complain of shortness of breath and cough.  Assesement and Plan: Principal Problem:   Multifocal pneumonia Active Problems:   Hyperlipidemia   Major depressive disorder   Pulmonary embolism (HCC)   Essential hypertension   Type 2 diabetes mellitus (HCC)   GERD (gastroesophageal reflux disease)   Principal problem Sepsis due to multifocal pneumonia -met sepsis criteria with leukocytosis, fever, tachycardia, tachypnea, and a source as evidenced on imaging, patient was started on ceftriaxone and azithromycin, continue.  Today's day #2.  Continue supplemental oxygen, PRN nebs  Active problems History of PE-continue Xarelto  Essential hypertension-continue losartan.  2D echo done 5/22 shows normal LVEF 60 to 65%, no WMA, LVH, RV was normal.  Obesity, borderline-BMI 30.25  DM2-continue home regimen  Lab Results  Component Value Date   HGBA1C 6.8 (H) 09/06/2022   CBG (last 3)  Recent Labs    09/06/22 1634 09/06/22 2251 09/07/22 0754  GLUCAP 153* 139* 89    Thrombocytopenia-possibly in the setting of acute illness  History of depression-continue Abilify  Scheduled Meds:  ARIPiprazole  2 mg Oral Daily   empagliflozin  25 mg Oral Daily   insulin aspart  0-15 Units Subcutaneous TID WC   insulin aspart  0-5 Units Subcutaneous QHS   ipratropium-albuterol   3 mL Nebulization QID   losartan  50 mg Oral Daily   [START ON 09/08/2022] metFORMIN  1,000 mg Oral BID WC   rivaroxaban  20 mg Oral Q supper   sodium chloride flush  3 mL Intravenous Q12H   Continuous Infusions:  0.9 % NaCl with KCl 20 mEq / L 100 mL/hr (09/07/22 0155)   azithromycin 500 mg (09/07/22 0158)   cefTRIAXone (ROCEPHIN)  IV 2 g (09/07/22 0855)   PRN Meds:.acetaminophen, albuterol, guaiFENesin, ondansetron (ZOFRAN) IV, mouth rinse  Current Outpatient Medications  Medication Instructions   acetaminophen (TYLENOL) 650 mg, Oral, Every 6 hours PRN   ARIPiprazole (ABILIFY) 2 mg, Oral, Daily   empagliflozin (JARDIANCE) 25 mg, Oral, Daily   FLUoxetine (PROZAC) 20 mg, Oral, 2 times daily PRN   HYDROcodone-acetaminophen (NORCO/VICODIN) 5-325 MG tablet 1-2 tablets, Oral, Every 6 hours PRN   insulin aspart (NOVOLOG) 10-20 Units, Subcutaneous, 2 times daily   insulin glargine (LANTUS) 75 Units, Subcutaneous, 2 times daily   losartan (COZAAR) 50 mg, Oral, Daily   metFORMIN (GLUCOPHAGE) 1,000 mg, Oral, 2 times daily with meals   mupirocin ointment (BACTROBAN) 2 % 1 application , Nasal, 2 times daily   polyethylene glycol (MIRALAX / GLYCOLAX) 17 g, Oral, Daily   rivaroxaban (XARELTO) 20 mg, Oral, Daily with supper   Semaglutide (2 MG/DOSE) 2 mg, Subcutaneous, Every 7 days   senna (SENOKOT) 17.2 mg, Oral, 2 times daily   silver sulfADIAZINE (SILVADENE) 1 % cream Topical, Daily   Skin Protectants, Misc. (INTERDRY AG TEXTILE 10"X144") SHEE 2 packets, Apply externally, Weekly   sulfamethoxazole-trimethoprim (BACTRIM DS,SEPTRA DS) 800-160 MG  tablet 2 tablets, Oral, Every 12 hours    Diet Orders (From admission, onward)     Start     Ordered   09/06/22 0326  Diet heart healthy/carb modified Room service appropriate? Yes; Fluid consistency: Thin  Diet effective now       Question Answer Comment  Diet-HS Snack? Nothing   Room service appropriate? Yes   Fluid consistency: Thin       09/06/22 0325            DVT prophylaxis:  rivaroxaban (XARELTO) tablet 20 mg   Lab Results  Component Value Date   PLT 126 (L) 09/07/2022      Code Status: Full Code  Family Communication: no family at bedside   Status is: Inpatient Remains inpatient appropriate because: severity of illness   Level of care: Progressive  Consultants:  none  Objective: Vitals:   09/07/22 0500 09/07/22 0603 09/07/22 0806 09/07/22 0807  BP:  137/74    Pulse:      Resp:  17    Temp:  (!) 100.7 F (38.2 C)  98.4 F (36.9 C)  TempSrc:  Oral  Oral  SpO2:  93% (!) 89%   Weight: 104 kg     Height:        Intake/Output Summary (Last 24 hours) at 09/07/2022 1113 Last data filed at 09/07/2022 0900 Gross per 24 hour  Intake 2416.99 ml  Output 3700 ml  Net -1283.01 ml   Wt Readings from Last 3 Encounters:  09/07/22 104 kg  04/25/16 135.6 kg  03/01/16 (!) 137 kg    Examination:  Constitutional: NAD Eyes: no scleral icterus ENMT: Mucous membranes are moist.  Neck: normal, supple Respiratory: Bibasilar rhonchi, no wheezing Cardiovascular: Regular rate and rhythm, no murmurs / rubs / gallops. No LE edema.  Abdomen: non distended, no tenderness. Bowel sounds positive.  Musculoskeletal: no clubbing / cyanosis.   Data Reviewed: I have independently reviewed following labs and imaging studies   CBC Recent Labs  Lab 09/05/22 2335 09/06/22 0505 09/07/22 0419  WBC 13.4* 11.7* 11.4*  HGB 14.7 13.3 13.9  HCT 42.6 40.1 41.3  PLT 139* 135* 126*  MCV 92.8 94.1 95.2  MCH 32.0 31.2 32.0  MCHC 34.5 33.2 33.7  RDW 13.0 13.0 13.2  LYMPHSABS 0.4* 1.1  --   MONOABS 0.9 1.1*  --   EOSABS 0.0 0.1  --   BASOSABS 0.0 0.0  --     Recent Labs  Lab 09/05/22 0059 09/05/22 2335 09/05/22 2350 09/06/22 0145 09/06/22 0505 09/07/22 0419  NA  --  133*  --   --  136 137  K  --  3.0*  --   --  3.5 3.9  CL  --  100  --   --  103 104  CO2  --  20*  --   --  23 21*  GLUCOSE  --  262*  --    --  114* 121*  BUN  --  13  --   --  10 11  CREATININE  --  0.80  --   --  0.63 0.59*  CALCIUM  --  8.8*  --   --  8.1* 8.5*  AST  --  18  --   --   --  27  ALT  --  14  --   --   --  16  ALKPHOS  --  60  --   --   --  60  BILITOT  --  1.2  --   --   --  1.2  ALBUMIN  --  3.6  --   --   --  3.2*  MG  --   --   --   --  1.8  --   LATICACIDVEN  --   --  1.9 1.5  --   --   INR  --  1.4*  --   --   --   --   HGBA1C  --   --   --   --  6.8*  --   BNP 61.7  --   --   --   --   --     ------------------------------------------------------------------------------------------------------------------ No results for input(s): "CHOL", "HDL", "LDLCALC", "TRIG", "CHOLHDL", "LDLDIRECT" in the last 72 hours.  Lab Results  Component Value Date   HGBA1C 6.8 (H) 09/06/2022   ------------------------------------------------------------------------------------------------------------------ No results for input(s): "TSH", "T4TOTAL", "T3FREE", "THYROIDAB" in the last 72 hours.  Invalid input(s): "FREET3"  Cardiac Enzymes No results for input(s): "CKMB", "TROPONINI", "MYOGLOBIN" in the last 168 hours.  Invalid input(s): "CK" ------------------------------------------------------------------------------------------------------------------    Component Value Date/Time   BNP 61.7 09/05/2022 0059    CBG: Recent Labs  Lab 09/06/22 0720 09/06/22 1207 09/06/22 1634 09/06/22 2251 09/07/22 0754  GLUCAP 101* 149* 153* 139* 89    Recent Results (from the past 240 hour(s))  Culture, blood (Routine x 2)     Status: None (Preliminary result)   Collection Time: 09/05/22 11:15 PM   Specimen: BLOOD  Result Value Ref Range Status   Specimen Description   Final    BLOOD SITE NOT SPECIFIED Performed at [facility], [address] [place], [place] [address]    Special Requests   Final    BOTTLES DRAWN AEROBIC AND ANAEROBIC Blood Culture results may not be optimal due to an  excessive volume of blood received in culture bottles Performed at [facility], [address], [place], [place] [address]    Culture   Final    NO GROWTH 1 DAY Performed at [facility], [address] [place], [place] [address]    Report Status PENDING  Incomplete  Resp panel by RT-PCR (RSV, Flu A&B, Covid) Anterior Nasal Swab     Status: None   Collection Time: 09/05/22 11:17 PM   Specimen: Anterior Nasal Swab  Result Value Ref Range Status   SARS Coronavirus 2 by RT PCR NEGATIVE NEGATIVE Final    Comment: (NOTE) SARS-CoV-2 target nucleic acids are NOT DETECTED.  The SARS-CoV-2 RNA is generally detectable in upper respiratory specimens during the acute phase of infection. The lowest concentration of SARS-CoV-2 viral copies this assay can detect is 138 copies/mL. A negative result does not preclude SARS-Cov-2 infection and should not be used as the sole basis for treatment or other patient management decisions. A negative result may occur with  improper specimen collection/handling, submission of specimen other than nasopharyngeal swab, presence of viral mutation(s) within the areas targeted by this assay, and inadequate number of viral copies(<138 copies/mL). A negative result must be combined with clinical observations, patient history, and epidemiological information. The expected result is Negative.  Fact Sheet for Patients:  BloggerCourse.com  Fact Sheet for Healthcare Providers:  SeriousBroker.it  This test is no t yet approved or cleared by the [place] FDA and  has been authorized for detection and/or diagnosis of SARS-CoV-2 by FDA under an Emergency Use Authorization (EUA). This EUA will  remain  in effect (meaning this test can be used) for the duration of the COVID-19 declaration under Section 564(b)(1) of the Act, 21 U.S.C.section 360bbb-3(b)(1), unless the authorization is terminated   or revoked sooner.       Influenza A by PCR NEGATIVE NEGATIVE Final   Influenza B by PCR NEGATIVE NEGATIVE Final    Comment: (NOTE) The Xpert Xpress SARS-CoV-2/FLU/RSV plus assay is intended as an aid in the diagnosis of influenza from Nasopharyngeal swab specimens and should not be used as a sole basis for treatment. Nasal washings and aspirates are unacceptable for Xpert Xpress SARS-CoV-2/FLU/RSV testing.  Fact Sheet for Patients: BloggerCourse.com  Fact Sheet for Healthcare Providers: SeriousBroker.it  This test is not yet approved or cleared by the [place] FDA and has been authorized for detection and/or diagnosis of SARS-CoV-2 by FDA under an Emergency Use Authorization (EUA). This EUA will remain in effect (meaning this test can be used) for the duration of the COVID-19 declaration under Section 564(b)(1) of the Act, 21 U.S.C. section 360bbb-3(b)(1), unless the authorization is terminated or revoked.     Resp Syncytial Virus by PCR NEGATIVE NEGATIVE Final    Comment: (NOTE) Fact Sheet for Patients: BloggerCourse.com  Fact Sheet for Healthcare Providers: SeriousBroker.it  This test is not yet approved or cleared by the [place] FDA and has been authorized for detection and/or diagnosis of SARS-CoV-2 by FDA under an Emergency Use Authorization (EUA). This EUA will remain in effect (meaning this test can be used) for the duration of the COVID-19 declaration under Section 564(b)(1) of the Act, 21 U.S.C. section 360bbb-3(b)(1), unless the authorization is terminated or revoked.  Performed at [facility], [address], [place], [place] [address]   Culture, blood (Routine x 2)     Status: None (Preliminary result)   Collection Time: 09/06/22 12:25 AM   Specimen: BLOOD  Result Value Ref Range Status   Specimen Description   Final     BLOOD SITE NOT SPECIFIED Performed at [facility], [address] [place], [place] [address]    Special Requests   Final    BOTTLES DRAWN AEROBIC AND ANAEROBIC Blood Culture adequate volume Performed at [facility], [address], [place], [place] [address]    Culture   Final    NO GROWTH 1 DAY Performed at [facility], [address] [place], [place] [address]    Report Status PENDING  Incomplete     Radiology Studies: ECHOCARDIOGRAM COMPLETE  Result Date: 09/07/2022    ECHOCARDIOGRAM REPORT   Patient Name:   Bradley Mcclure [facility] Date of Exam: 09/07/2022 Medical Rec #:  [id number]          Height:       73.0 in Accession #:    [id number]         Weight:       229.3 lb Date of Birth:  1960-11-10          BSA:          2.280 m Patient Age:    61 years           BP:           137/74 mmHg Patient Gender: M                  HR:           100 bpm. Exam Location:  Inpatient Procedure: 2D Echo, Color Doppler, Cardiac  Doppler and Intracardiac            Opacification Agent Indications:    Syncope, Pulmonary Embolus  History:        Patient has prior history of Echocardiogram examinations, most                 recent 11/17/2015. Pulmonary Embolus, Signs/Symptoms:Syncope; Risk                 Factors:Dyslipidemia, Diabetes and Hypertension.  Sonographer:    Milbert Coulter Referring Phys: [id number] DAVID MANUEL ORTIZ  Sonographer Comments: Suboptimal apical window and no subcostal window. IMPRESSIONS  1. Left ventricular ejection fraction, by estimation, is 60 to 65%. The left ventricle has normal function. The left ventricle has no regional wall motion abnormalities. There is moderate left ventricular hypertrophy. Left ventricular diastolic parameters are indeterminate.  2. Right ventricular systolic function is normal. The right ventricular size is normal.  3. The mitral valve is normal in structure. No evidence of mitral valve regurgitation. No evidence of mitral  stenosis.  4. The aortic valve is normal in structure. Aortic valve regurgitation is not visualized. No aortic stenosis is present.  5. The inferior vena cava is normal in size with greater than 50% respiratory variability, suggesting right atrial pressure of 3 mmHg. FINDINGS  Left Ventricle: Left ventricular ejection fraction, by estimation, is 60 to 65%. The left ventricle has normal function. The left ventricle has no regional wall motion abnormalities. Definity contrast agent was given IV to delineate the left ventricular  endocardial borders. The left ventricular internal cavity size was normal in size. There is moderate left ventricular hypertrophy. Left ventricular diastolic parameters are indeterminate. Right Ventricle: The right ventricular size is normal. No increase in right ventricular wall thickness. Right ventricular systolic function is normal. Left Atrium: Left atrial size was normal in size. Right Atrium: Right atrial size was normal in size. Pericardium: There is no evidence of pericardial effusion. Mitral Valve: The mitral valve is normal in structure. No evidence of mitral valve regurgitation. No evidence of mitral valve stenosis. Tricuspid Valve: The tricuspid valve is normal in structure. Tricuspid valve regurgitation is not demonstrated. No evidence of tricuspid stenosis. Aortic Valve: The aortic valve is normal in structure. Aortic valve regurgitation is not visualized. No aortic stenosis is present. Aortic valve mean gradient measures 4.0 mmHg. Aortic valve peak gradient measures 6.8 mmHg. Aortic valve area, by VTI measures 3.36 cm. Pulmonic Valve: The pulmonic valve was normal in structure. Pulmonic valve regurgitation is not visualized. No evidence of pulmonic stenosis. Aorta: The aortic root is normal in size and structure. Venous: The inferior vena cava is normal in size with greater than 50% respiratory variability, suggesting right atrial pressure of 3 mmHg. IAS/Shunts: No atrial  level shunt detected by color flow Doppler.  LEFT VENTRICLE PLAX 2D LVIDd:         4.30 cm   Diastology LVIDs:         2.70 cm   LV e' medial:    8.59 cm/s LV PW:         1.40 cm   LV E/e' medial:  10.5 LV IVS:        1.30 cm   LV e' lateral:   11.10 cm/s LVOT diam:     2.50 cm   LV E/e' lateral: 8.1 LV SV:         69 LV SV Index:   30 LVOT Area:     4.91 cm  RIGHT VENTRICLE RV S prime:     15.90 cm/s TAPSE (M-mode): 2.4 cm LEFT ATRIUM         Index LA diam:    4.00 cm 1.75 cm/m  AORTIC VALVE AV Area (Vmax):    3.33 cm AV Area (Vmean):   3.24 cm AV Area (VTI):     3.36 cm AV Vmax:           130.00 cm/s AV Vmean:          86.900 cm/s AV VTI:            0.206 m AV Peak Grad:      6.8 mmHg AV Mean Grad:      4.0 mmHg LVOT Vmax:         88.30 cm/s LVOT Vmean:        57.400 cm/s LVOT VTI:          0.141 m LVOT/AV VTI ratio: 0.68  AORTA Ao Root diam: 3.50 cm Ao Asc diam:  3.50 cm MITRAL VALVE MV Area (PHT): 4.41 cm    SHUNTS MV Decel Time: 172 msec    Systemic VTI:  0.14 m MV E velocity: 90.40 cm/s  Systemic Diam: 2.50 cm MV A velocity: 83.10 cm/s MV E/A ratio:  1.09 Donato Schultz MD Electronically signed by Donato Schultz MD Signature Date/Time: 09/07/2022/10:36:58 AM    Final      Pamella Pert, MD, PhD [facility]  Between 7 am - 7 pm I am available, please contact me via Amion (for emergencies) or Securechat (non urgent messages)  Between 7 pm - 7 am I am not available, please contact night coverage MD/APP via Amion

## 2022-09-07 NOTE — TOC CM/SW Note (Signed)
  Transition of Care [facility]) Screening Note   Patient Details  Name: MONTRELL LUSIGNAN Date of Birth: 02/17/1961   [facility]) CM/SW Contact:    Howell Rucks, RN Phone Number: 09/07/2022, 9:57 AM    Transition of Care Department [facility]) has reviewed patient and no TOC needs have been identified at this time. We will continue to monitor patient advancement through interdisciplinary progression rounds. If new patient transition needs arise, please place a TOC consult.

## 2022-09-08 ENCOUNTER — Inpatient Hospital Stay (HOSPITAL_COMMUNITY): Payer: Medicare PPO

## 2022-09-08 DIAGNOSIS — J189 Pneumonia, unspecified organism: Secondary | ICD-10-CM | POA: Diagnosis not present

## 2022-09-08 LAB — COMPREHENSIVE METABOLIC PANEL
ALT: 16 U/L (ref 0–44)
AST: 21 U/L (ref 15–41)
Albumin: 3 g/dL — ABNORMAL LOW (ref 3.5–5.0)
Alkaline Phosphatase: 68 U/L (ref 38–126)
Anion gap: 15 (ref 5–15)
BUN: 10 mg/dL (ref 6–20)
CO2: 14 mmol/L — ABNORMAL LOW (ref 22–32)
Calcium: 8.5 mg/dL — ABNORMAL LOW (ref 8.9–10.3)
Chloride: 103 mmol/L (ref 98–111)
Creatinine, Ser: 0.67 mg/dL (ref 0.61–1.24)
GFR, Estimated: >60 mL/min (ref >60)
Glucose, Bld: 93 mg/dL (ref 70–99)
Potassium: 4.2 mmol/L (ref 3.5–5.1)
Sodium: 132 mmol/L — ABNORMAL LOW (ref 135–145)
Total Bilirubin: 1.7 mg/dL — ABNORMAL HIGH (ref 0.3–1.2)
Total Protein: 6.9 g/dL (ref 6.5–8.1)

## 2022-09-08 LAB — BLOOD GAS, VENOUS
Acid-base deficit: 14.8 mmol/L — ABNORMAL HIGH (ref 0.0–2.0)
Bicarbonate: 10.3 mmol/L — ABNORMAL LOW (ref 20.0–28.0)
O2 Saturation: 97.7 %
Patient temperature: 36.4
pCO2, Ven: 22 mmHg — ABNORMAL LOW (ref 44–60)
pH, Ven: 7.27 (ref 7.25–7.43)
pO2, Ven: 76 mmHg — ABNORMAL HIGH (ref 32–45)

## 2022-09-08 LAB — GLUCOSE, CAPILLARY
Glucose-Capillary: 94 mg/dL (ref 70–99)
Glucose-Capillary: 117 mg/dL — ABNORMAL HIGH (ref 70–99)
Glucose-Capillary: 127 mg/dL — ABNORMAL HIGH (ref 70–99)
Glucose-Capillary: 210 mg/dL — ABNORMAL HIGH (ref 70–99)

## 2022-09-08 LAB — MAGNESIUM: Magnesium: 1.5 mg/dL — ABNORMAL LOW (ref 1.7–2.4)

## 2022-09-08 LAB — CBC
HCT: 41.9 % (ref 39.0–52.0)
Hemoglobin: 13.7 g/dL (ref 13.0–17.0)
MCH: 31.4 pg (ref 26.0–34.0)
MCHC: 32.7 g/dL (ref 30.0–36.0)
MCV: 96.1 fL (ref 80.0–100.0)
Platelets: 148 10*3/uL — ABNORMAL LOW (ref 150–400)
RBC: 4.36 MIL/uL (ref 4.22–5.81)
RDW: 13.4 % (ref 11.5–15.5)
WBC: 18 10*3/uL — ABNORMAL HIGH (ref 4.0–10.5)
nRBC: 0 % (ref 0.0–0.2)

## 2022-09-08 MED ORDER — METOPROLOL TARTRATE 25 MG PO TABS
25.0000 mg | ORAL_TABLET | Freq: Two times a day (BID) | ORAL | Status: DC
  Administered 2022-09-08 – 2022-09-19 (×19): 25 mg via ORAL
  Filled 2022-09-08 (×20): qty 1

## 2022-09-08 MED ORDER — SALINE SPRAY 0.65 % NA SOLN
1.0000 | NASAL | Status: DC | PRN
  Administered 2022-09-08 – 2022-09-09 (×2): 1 via NASAL
  Filled 2022-09-08: qty 44

## 2022-09-08 MED ORDER — METOPROLOL TARTRATE 5 MG/5ML IV SOLN
INTRAVENOUS | Status: AC
  Administered 2022-09-08: 5 mg via INTRAVENOUS
  Filled 2022-09-08: qty 5

## 2022-09-08 MED ORDER — MAGNESIUM SULFATE 2 GM/50ML IV SOLN
2.0000 g | Freq: Once | INTRAVENOUS | Status: AC
  Administered 2022-09-08: 2 g via INTRAVENOUS
  Filled 2022-09-08: qty 50

## 2022-09-08 MED ORDER — METOPROLOL TARTRATE 5 MG/5ML IV SOLN: 5.0000 mg | Freq: Four times a day (QID) | INTRAVENOUS | Status: DC | PRN

## 2022-09-08 NOTE — Progress Notes (Signed)
PROGRESS NOTE  Bradley Mcclure:[id number] DOB: 1961-02-24 DOA: 09/05/2022 PCP: [facility]   LOS: 2 days   Brief Narrative / Interim history: 62 year old male with prior history of MRSA bacteremia, anxiety, depression, mild cognitive disorder, HTN, HLD, DM2, NAFLD, prior PE on chronic anticoagulation, obesity comes to the hospital with several days of URI type symptoms, cough, chest congestion and eventually passing out.  In the ED he was found to have right lower lobe pneumonia, he was started on antibiotics and admitted to the hospital  Subjective / 24h Interval events: He is still coughing, severe at times.  Complains of intermittent shortness of breath  Assesement and Plan: Principal Problem:   Multifocal pneumonia Active Problems:   Hyperlipidemia   Major depressive disorder   Pulmonary embolism (HCC)   Essential hypertension   Type 2 diabetes mellitus (HCC)   GERD (gastroesophageal reflux disease)   Principal problem Sepsis due to multifocal pneumonia -met sepsis criteria with leukocytosis, fever, tachycardia, tachypnea, and a source as evidenced on imaging, patient was started on ceftriaxone and azithromycin, continue.  Today's day #3.   -Continue nebs, supplemental oxygen, wean off to room air as tolerated. -Worsening leukocytosis today, also low-grade temp at 99.9.  May need repeat imaging if white count continues to stay high  Active problems History of PE-continue Xarelto  Essential hypertension-continue losartan.  2D echo done 5/22 shows normal LVEF 60 to 65%, no WMA, LVH, RV was normal.  Obesity, borderline-BMI 30.25  DM2-continue home regimen  Lab Results  Component Value Date   HGBA1C 6.8 (H) 09/06/2022   CBG (last 3)  Recent Labs    09/07/22 1642 09/07/22 2140 09/08/22 0737  GLUCAP 85 103* 94    Thrombocytopenia-possibly in the setting of acute illness  History of depression-continue Abilify  Scheduled Meds:  ARIPiprazole  2  mg Oral Daily   empagliflozin  25 mg Oral Daily   insulin aspart  0-15 Units Subcutaneous TID WC   insulin aspart  0-5 Units Subcutaneous QHS   ipratropium-albuterol  3 mL Nebulization TID   losartan  50 mg Oral Daily   metFORMIN  1,000 mg Oral BID WC   rivaroxaban  20 mg Oral Q supper   sodium chloride flush  3 mL Intravenous Q12H   Continuous Infusions:  azithromycin 500 mg (09/08/22 0157)   cefTRIAXone (ROCEPHIN)  IV 2 g (09/08/22 1053)   PRN Meds:.acetaminophen, albuterol, guaiFENesin, ondansetron (ZOFRAN) IV, mouth rinse  Current Outpatient Medications  Medication Instructions   acetaminophen (TYLENOL) 650 mg, Oral, Every 6 hours PRN   ARIPiprazole (ABILIFY) 2 mg, Oral, Daily   empagliflozin (JARDIANCE) 25 mg, Oral, Daily   FLUoxetine (PROZAC) 20 mg, Oral, 2 times daily PRN   HYDROcodone-acetaminophen (NORCO/VICODIN) 5-325 MG tablet 1-2 tablets, Oral, Every 6 hours PRN   insulin aspart (NOVOLOG) 10-20 Units, Subcutaneous, 2 times daily   insulin glargine (LANTUS) 75 Units, Subcutaneous, 2 times daily   losartan (COZAAR) 50 mg, Oral, Daily   metFORMIN (GLUCOPHAGE) 1,000 mg, Oral, 2 times daily with meals   mupirocin ointment (BACTROBAN) 2 % 1 application , Nasal, 2 times daily   polyethylene glycol (MIRALAX / GLYCOLAX) 17 g, Oral, Daily   rivaroxaban (XARELTO) 20 mg, Oral, Daily with supper   Semaglutide (2 MG/DOSE) 2 mg, Subcutaneous, Every 7 days   senna (SENOKOT) 17.2 mg, Oral, 2 times daily   silver sulfADIAZINE (SILVADENE) 1 % cream Topical, Daily   Skin Protectants, Misc. (INTERDRY AG TEXTILE 10"X144") SHEE 2 packets,  Apply externally, Weekly   sulfamethoxazole-trimethoprim (BACTRIM DS,SEPTRA DS) 800-160 MG tablet 2 tablets, Oral, Every 12 hours    Diet Orders (From admission, onward)     Start     Ordered   09/06/22 0326  Diet heart healthy/carb modified Room service appropriate? Yes; Fluid consistency: Thin  Diet effective now       Question Answer Comment   Diet-HS Snack? Nothing   Room service appropriate? Yes   Fluid consistency: Thin      09/06/22 0325            DVT prophylaxis:  rivaroxaban (XARELTO) tablet 20 mg   Lab Results  Component Value Date   PLT 148 (L) 09/08/2022      Code Status: Full Code  Family Communication: no family at bedside   Status is: Inpatient Remains inpatient appropriate because: severity of illness   Level of care: Progressive  Consultants:  none  Objective: Vitals:   09/08/22 0500 09/08/22 0637 09/08/22 0747 09/08/22 1117  BP:  (!) 160/73  (!) 143/77  Pulse:  (!) 125  (!) 115  Resp:  (!) 24  (!) 24  Temp:  98.6 F (37 C)  99.9 F (37.7 C)  TempSrc:  Oral  Oral  SpO2:  93% 92% 93%  Weight: 103.1 kg     Height:        Intake/Output Summary (Last 24 hours) at 09/08/2022 1138 Last data filed at 09/08/2022 1111 Gross per 24 hour  Intake 3642.28 ml  Output 6450 ml  Net -2807.72 ml    Wt Readings from Last 3 Encounters:  09/08/22 103.1 kg  04/25/16 135.6 kg  03/01/16 (!) 137 kg    Examination:  Constitutional: NAD Eyes: lids and conjunctivae normal, no scleral icterus ENMT: mmm Neck: normal, supple Respiratory: Diffuse bilateral rhonchi Cardiovascular: Regular rate and rhythm, no murmurs / rubs / gallops. No LE edema. Abdomen: soft, no distention, no tenderness. Bowel sounds positive.  Skin: no rashes   Data Reviewed: I have independently reviewed following labs and imaging studies   CBC Recent Labs  Lab 09/05/22 2335 09/06/22 0505 09/07/22 0419 09/08/22 0351  WBC 13.4* 11.7* 11.4* 18.0*  HGB 14.7 13.3 13.9 13.7  HCT 42.6 40.1 41.3 41.9  PLT 139* 135* 126* 148*  MCV 92.8 94.1 95.2 96.1  MCH 32.0 31.2 32.0 31.4  MCHC 34.5 33.2 33.7 32.7  RDW 13.0 13.0 13.2 13.4  LYMPHSABS 0.4* 1.1  --   --   MONOABS 0.9 1.1*  --   --   EOSABS 0.0 0.1  --   --   BASOSABS 0.0 0.0  --   --      Recent Labs  Lab 09/05/22 0059 09/05/22 2335 09/05/22 2350  09/06/22 0145 09/06/22 0505 09/07/22 0419 09/08/22 0351  NA  --  133*  --   --  136 137 132*  K  --  3.0*  --   --  3.5 3.9 4.2  CL  --  100  --   --  103 104 103  CO2  --  20*  --   --  23 21* 14*  GLUCOSE  --  262*  --   --  114* 121* 93  BUN  --  13  --   --  10 11 10   CREATININE  --  0.80  --   --  0.63 0.59* 0.67  CALCIUM  --  8.8*  --   --  8.1* 8.5* 8.5*  AST  --  18  --   --   --  27 21  ALT  --  14  --   --   --  16 16  ALKPHOS  --  60  --   --   --  60 68  BILITOT  --  1.2  --   --   --  1.2 1.7*  ALBUMIN  --  3.6  --   --   --  3.2* 3.0*  MG  --   --   --   --  1.8  --  1.5*  LATICACIDVEN  --   --  1.9 1.5  --   --   --   INR  --  1.4*  --   --   --   --   --   HGBA1C  --   --   --   --  6.8*  --   --   BNP 61.7  --   --   --   --   --   --      ------------------------------------------------------------------------------------------------------------------ No results for input(s): "CHOL", "HDL", "LDLCALC", "TRIG", "CHOLHDL", "LDLDIRECT" in the last 72 hours.  Lab Results  Component Value Date   HGBA1C 6.8 (H) 09/06/2022   ------------------------------------------------------------------------------------------------------------------ No results for input(s): "TSH", "T4TOTAL", "T3FREE", "THYROIDAB" in the last 72 hours.  Invalid input(s): "FREET3"  Cardiac Enzymes No results for input(s): "CKMB", "TROPONINI", "MYOGLOBIN" in the last 168 hours.  Invalid input(s): "CK" ------------------------------------------------------------------------------------------------------------------    Component Value Date/Time   BNP 61.7 09/05/2022 0059    CBG: Recent Labs  Lab 09/07/22 0754 09/07/22 1157 09/07/22 1642 09/07/22 2140 09/08/22 0737  GLUCAP 89 92 85 103* 94     Recent Results (from the past 240 hour(s))  Culture, blood (Routine x 2)     Status: None (Preliminary result)   Collection Time: 09/05/22 11:15 PM   Specimen: BLOOD  Result Value Ref  Range Status   Specimen Description   Final    BLOOD SITE NOT SPECIFIED Performed at [facility], [address] [place], [place] [address]    Special Requests   Final    BOTTLES DRAWN AEROBIC AND ANAEROBIC Blood Culture results may not be optimal due to an excessive volume of blood received in culture bottles Performed at [facility], [address] [place], [place] [address]    Culture   Final    NO GROWTH 2 DAYS Performed at [facility], [address], [place], [place] [address]    Report Status PENDING  Incomplete  Resp panel by RT-PCR (RSV, Flu A&B, Covid) Anterior Nasal Swab     Status: None   Collection Time: 09/05/22 11:17 PM   Specimen: Anterior Nasal Swab  Result Value Ref Range Status   SARS Coronavirus 2 by RT PCR NEGATIVE NEGATIVE Final    Comment: (NOTE) SARS-CoV-2 target nucleic acids are NOT DETECTED.  The SARS-CoV-2 RNA is generally detectable in upper respiratory specimens during the acute phase of infection. The lowest concentration of SARS-CoV-2 viral copies this assay can detect is 138 copies/mL. A negative result does not preclude SARS-Cov-2 infection and should not be used as the sole basis for treatment or other patient management decisions. A negative result may occur with  improper specimen collection/handling, submission of specimen other than nasopharyngeal swab, presence of viral mutation(s) within the areas targeted by this assay, and inadequate number of viral copies(<138 copies/mL). A negative result must be combined with  clinical observations, patient history, and epidemiological information. The expected result is Negative.  Fact Sheet for Patients:  BloggerCourse.com  Fact Sheet for Healthcare Providers:  SeriousBroker.it  This test is no t yet approved or cleared by the [place] FDA and  has been authorized for detection and/or diagnosis  of SARS-CoV-2 by FDA under an Emergency Use Authorization (EUA). This EUA will remain  in effect (meaning this test can be used) for the duration of the COVID-19 declaration under Section 564(b)(1) of the Act, 21 U.S.C.section 360bbb-3(b)(1), unless the authorization is terminated  or revoked sooner.       Influenza A by PCR NEGATIVE NEGATIVE Final   Influenza B by PCR NEGATIVE NEGATIVE Final    Comment: (NOTE) The Xpert Xpress SARS-CoV-2/FLU/RSV plus assay is intended as an aid in the diagnosis of influenza from Nasopharyngeal swab specimens and should not be used as a sole basis for treatment. Nasal washings and aspirates are unacceptable for Xpert Xpress SARS-CoV-2/FLU/RSV testing.  Fact Sheet for Patients: BloggerCourse.com  Fact Sheet for Healthcare Providers: SeriousBroker.it  This test is not yet approved or cleared by the [place] FDA and has been authorized for detection and/or diagnosis of SARS-CoV-2 by FDA under an Emergency Use Authorization (EUA). This EUA will remain in effect (meaning this test can be used) for the duration of the COVID-19 declaration under Section 564(b)(1) of the Act, 21 U.S.C. section 360bbb-3(b)(1), unless the authorization is terminated or revoked.     Resp Syncytial Virus by PCR NEGATIVE NEGATIVE Final    Comment: (NOTE) Fact Sheet for Patients: BloggerCourse.com  Fact Sheet for Healthcare Providers: SeriousBroker.it  This test is not yet approved or cleared by the [place] FDA and has been authorized for detection and/or diagnosis of SARS-CoV-2 by FDA under an Emergency Use Authorization (EUA). This EUA will remain in effect (meaning this test can be used) for the duration of the COVID-19 declaration under Section 564(b)(1) of the Act, 21 U.S.C. section 360bbb-3(b)(1), unless the authorization is terminated  or revoked.  Performed at [facility], [address] [place], [place] [address]   Culture, blood (Routine x 2)     Status: None (Preliminary result)   Collection Time: 09/06/22 12:25 AM   Specimen: BLOOD  Result Value Ref Range Status   Specimen Description   Final    BLOOD SITE NOT SPECIFIED Performed at [facility], [address], [place], [place] [address]    Special Requests   Final    BOTTLES DRAWN AEROBIC AND ANAEROBIC Blood Culture adequate volume Performed at [facility], [address], [place], [place] [address]    Culture   Final    NO GROWTH 2 DAYS Performed at [facility], [address], [place], [place] [address]    Report Status PENDING  Incomplete     Radiology Studies: No results found.   Pamella Pert, MD, PhD [facility]  Between 7 am - 7 pm I am available, please contact me via Amion (for emergencies) or Securechat (non urgent messages)  Between 7 pm - 7 am I am not available, please contact night coverage MD/APP via Amion

## 2022-09-08 NOTE — Progress Notes (Addendum)
Rapid Response Event Note  Rechecked on patient with Primary RN and on-call MD.  Patient was resting with eyes closed; decreased use of accessory muscles; labs had resulted;  And will continue to closely monitor on floor with further interventions, if necessary (Rounded on around 0030, 0145 and 0300)  Reason for Call :   Saw RED MEWS in rounds.  Primary RN Danise Mina.  He was about to call RRT  Initial Focused Assessment:  Patient is came at this time.  Alert and Oriented to person, place, time and situation.  He Is using his accessory muscles but breathing symmetrical and not labored at this time.  RR 28 (20) HR 118 (130) T 99.3 (102.7) BP 127/68(85) [145/68(90)]and SpO2 93% on 4L [place].   Interventions:  Tylenol and Lopressor given earlier which has helped to decrease T and BP CXR and Blood Gas Obtain AM labs STAT at midnight.  Plan of Care:  Will continue monitor and possible have more interventions after current one's are resulted.  Event Summary:  Noticed patient had a RED MEW at 1815 with medications given around 1830.  Patient still a Yellow/Red MEW with reassessment of VS.  During floor RRT rounds spoke with patient, primary RN and CN about the situation.  Contacted the on-call MD for the nurse.  MD Notified: A. Virgel Manifold NP Call Time: 2217 Arrival Time: 2215 End Time: 2240  Peter Minium, RN

## 2022-09-09 DIAGNOSIS — J189 Pneumonia, unspecified organism: Secondary | ICD-10-CM | POA: Diagnosis not present

## 2022-09-09 LAB — GLUCOSE, CAPILLARY
Glucose-Capillary: 114 mg/dL — ABNORMAL HIGH (ref 70–99)
Glucose-Capillary: 115 mg/dL — ABNORMAL HIGH (ref 70–99)
Glucose-Capillary: 121 mg/dL — ABNORMAL HIGH (ref 70–99)
Glucose-Capillary: 126 mg/dL — ABNORMAL HIGH (ref 70–99)
Glucose-Capillary: 126 mg/dL — ABNORMAL HIGH (ref 70–99)
Glucose-Capillary: 130 mg/dL — ABNORMAL HIGH (ref 70–99)
Glucose-Capillary: 132 mg/dL — ABNORMAL HIGH (ref 70–99)
Glucose-Capillary: 133 mg/dL — ABNORMAL HIGH (ref 70–99)
Glucose-Capillary: 135 mg/dL — ABNORMAL HIGH (ref 70–99)
Glucose-Capillary: 139 mg/dL — ABNORMAL HIGH (ref 70–99)
Glucose-Capillary: 141 mg/dL — ABNORMAL HIGH (ref 70–99)
Glucose-Capillary: 141 mg/dL — ABNORMAL HIGH (ref 70–99)
Glucose-Capillary: 172 mg/dL — ABNORMAL HIGH (ref 70–99)
Glucose-Capillary: 186 mg/dL — ABNORMAL HIGH (ref 70–99)

## 2022-09-09 LAB — BASIC METABOLIC PANEL
Anion gap: 15 (ref 5–15)
Anion gap: 22 — ABNORMAL HIGH (ref 5–15)
Anion gap: 15 (ref 5–15)
Anion gap: 17 — ABNORMAL HIGH (ref 5–15)
Anion gap: 16 — ABNORMAL HIGH (ref 5–15)
BUN: 19 mg/dL (ref 6–20)
BUN: 25 mg/dL — ABNORMAL HIGH (ref 6–20)
BUN: 25 mg/dL — ABNORMAL HIGH (ref 6–20)
BUN: 26 mg/dL — ABNORMAL HIGH (ref 6–20)
BUN: 26 mg/dL — ABNORMAL HIGH (ref 6–20)
CO2: 12 mmol/L — ABNORMAL LOW (ref 22–32)
CO2: 8 mmol/L — ABNORMAL LOW (ref 22–32)
CO2: 12 mmol/L — ABNORMAL LOW (ref 22–32)
CO2: 10 mmol/L — ABNORMAL LOW (ref 22–32)
CO2: 11 mmol/L — ABNORMAL LOW (ref 22–32)
Calcium: 9.7 mg/dL (ref 8.9–10.3)
Calcium: 9.3 mg/dL (ref 8.9–10.3)
Calcium: 9.3 mg/dL (ref 8.9–10.3)
Calcium: 9.4 mg/dL (ref 8.9–10.3)
Calcium: 9.5 mg/dL (ref 8.9–10.3)
Chloride: 105 mmol/L (ref 98–111)
Chloride: 105 mmol/L (ref 98–111)
Chloride: 107 mmol/L (ref 98–111)
Chloride: 107 mmol/L (ref 98–111)
Chloride: 108 mmol/L (ref 98–111)
Creatinine, Ser: 0.86 mg/dL (ref 0.61–1.24)
Creatinine, Ser: 1 mg/dL (ref 0.61–1.24)
Creatinine, Ser: 0.93 mg/dL (ref 0.61–1.24)
Creatinine, Ser: 0.8 mg/dL (ref 0.61–1.24)
Creatinine, Ser: 0.8 mg/dL (ref 0.61–1.24)
GFR, Estimated: >60 mL/min (ref >60)
GFR, Estimated: >60 mL/min (ref >60)
GFR, Estimated: >60 mL/min (ref >60)
GFR, Estimated: >60 mL/min (ref >60)
GFR, Estimated: >60 mL/min (ref >60)
Glucose, Bld: 148 mg/dL — ABNORMAL HIGH (ref 70–99)
Glucose, Bld: 170 mg/dL — ABNORMAL HIGH (ref 70–99)
Glucose, Bld: 135 mg/dL — ABNORMAL HIGH (ref 70–99)
Glucose, Bld: 127 mg/dL — ABNORMAL HIGH (ref 70–99)
Glucose, Bld: 135 mg/dL — ABNORMAL HIGH (ref 70–99)
Potassium: 4.3 mmol/L (ref 3.5–5.1)
Potassium: 4 mmol/L (ref 3.5–5.1)
Potassium: 3.9 mmol/L (ref 3.5–5.1)
Potassium: 3.9 mmol/L (ref 3.5–5.1)
Potassium: 3.9 mmol/L (ref 3.5–5.1)
Sodium: 132 mmol/L — ABNORMAL LOW (ref 135–145)
Sodium: 135 mmol/L (ref 135–145)
Sodium: 134 mmol/L — ABNORMAL LOW (ref 135–145)
Sodium: 134 mmol/L — ABNORMAL LOW (ref 135–145)
Sodium: 135 mmol/L (ref 135–145)

## 2022-09-09 LAB — BLOOD GAS, ARTERIAL
Acid-base deficit: 19.1 mmol/L — ABNORMAL HIGH (ref 0.0–2.0)
Bicarbonate: 6.4 mmol/L — ABNORMAL LOW (ref 20.0–28.0)
Delivery systems: POSITIVE
Drawn by: 560031
Expiratory PAP: 8 cmH2O
FIO2: 40 %
Mode: POSITIVE
O2 Saturation: 98.8 %
Patient temperature: 36.4
Patient temperature: 37
RATE: 30 resp/min
pCO2 arterial: <18 mmHg — CL (ref 32–48)
pH, Arterial: 7.21 — ABNORMAL LOW (ref 7.35–7.45)
pO2, Arterial: 69 mmHg — ABNORMAL LOW (ref 83–108)
pO2, Arterial: 101 mmHg (ref 83–108)

## 2022-09-09 LAB — PHOSPHORUS: Phosphorus: 2.9 mg/dL (ref 2.5–4.6)

## 2022-09-09 LAB — CBC
HCT: 44.9 % (ref 39.0–52.0)
Hemoglobin: 14.6 g/dL (ref 13.0–17.0)
MCH: 31.3 pg (ref 26.0–34.0)
MCHC: 32.5 g/dL (ref 30.0–36.0)
MCV: 96.1 fL (ref 80.0–100.0)
Platelets: 189 10*3/uL (ref 150–400)
RBC: 4.67 MIL/uL (ref 4.22–5.81)
RDW: 13.8 % (ref 11.5–15.5)
WBC: 21.8 10*3/uL — ABNORMAL HIGH (ref 4.0–10.5)
nRBC: 0 % (ref 0.0–0.2)

## 2022-09-09 LAB — BETA-HYDROXYBUTYRIC ACID
Beta-Hydroxybutyric Acid: >8 mmol/L — ABNORMAL HIGH (ref 0.05–0.27)
Beta-Hydroxybutyric Acid: 7.84 mmol/L — ABNORMAL HIGH (ref 0.05–0.27)
Beta-Hydroxybutyric Acid: 5.26 mmol/L — ABNORMAL HIGH (ref 0.05–0.27)

## 2022-09-09 LAB — MRSA NEXT GEN BY PCR, NASAL: MRSA by PCR Next Gen: DETECTED — AB

## 2022-09-09 LAB — MAGNESIUM: Magnesium: 1.9 mg/dL (ref 1.7–2.4)

## 2022-09-09 LAB — LACTIC ACID, PLASMA: Lactic Acid, Venous: 1.2 mmol/L (ref 0.5–1.9)

## 2022-09-09 LAB — CULTURE, BLOOD (ROUTINE X 2)

## 2022-09-09 MED ORDER — DEXTROSE IN LACTATED RINGERS 5 % IV SOLN: INTRAVENOUS | Status: DC

## 2022-09-09 MED ORDER — CHLORHEXIDINE GLUCONATE CLOTH 2 % EX PADS
6.0000 | MEDICATED_PAD | Freq: Every day | CUTANEOUS | Status: DC
  Administered 2022-09-09 – 2022-09-12 (×4): 6 via TOPICAL

## 2022-09-09 MED ORDER — SODIUM CHLORIDE 0.9 % IV SOLN
2.0000 g | Freq: Three times a day (TID) | INTRAVENOUS | Status: AC
  Administered 2022-09-09 – 2022-09-13 (×14): 2 g via INTRAVENOUS
  Filled 2022-09-09 (×14): qty 12.5

## 2022-09-09 MED ORDER — DEXTROSE 50 % IV SOLN: 0.0000 mL | INTRAVENOUS | Status: DC | PRN

## 2022-09-09 MED ORDER — SODIUM BICARBONATE 8.4 % IV SOLN
50.0000 meq | Freq: Once | INTRAVENOUS | Status: AC
  Administered 2022-09-09: 50 meq via INTRAVENOUS
  Filled 2022-09-09: qty 50

## 2022-09-09 MED ORDER — FUROSEMIDE 10 MG/ML IJ SOLN
40.0000 mg | Freq: Once | INTRAMUSCULAR | Status: AC
  Administered 2022-09-09: 40 mg via INTRAVENOUS
  Filled 2022-09-09: qty 4

## 2022-09-09 MED ORDER — VANCOMYCIN HCL 2000 MG/400ML IV SOLN
2000.0000 mg | INTRAVENOUS | Status: AC
  Administered 2022-09-09: 2000 mg via INTRAVENOUS
  Filled 2022-09-09: qty 400

## 2022-09-09 MED ORDER — IPRATROPIUM-ALBUTEROL 0.5-2.5 (3) MG/3ML IN SOLN
3.0000 mL | Freq: Two times a day (BID) | RESPIRATORY_TRACT | Status: DC
  Administered 2022-09-09 – 2022-09-16 (×15): 3 mL via RESPIRATORY_TRACT
  Filled 2022-09-09 (×15): qty 3

## 2022-09-09 MED ORDER — VANCOMYCIN HCL 1250 MG/250ML IV SOLN
1250.0000 mg | Freq: Two times a day (BID) | INTRAVENOUS | Status: DC
  Administered 2022-09-10 – 2022-09-12 (×6): 1250 mg via INTRAVENOUS
  Filled 2022-09-09 (×6): qty 250

## 2022-09-09 MED ORDER — INSULIN REGULAR(HUMAN) IN NACL 100-0.9 UT/100ML-% IV SOLN
INTRAVENOUS | Status: DC
  Administered 2022-09-09: 6 [IU]/h via INTRAVENOUS
  Administered 2022-09-10: 1.2 [IU]/h via INTRAVENOUS
  Filled 2022-09-09 (×2): qty 100

## 2022-09-09 MED ORDER — POTASSIUM CHLORIDE 10 MEQ/100ML IV SOLN
10.0000 meq | INTRAVENOUS | Status: AC
  Administered 2022-09-09 (×2): 10 meq via INTRAVENOUS
  Filled 2022-09-09 (×2): qty 100

## 2022-09-09 MED ORDER — LACTATED RINGERS IV SOLN: INTRAVENOUS | Status: DC

## 2022-09-09 MED ORDER — VANCOMYCIN HCL 1750 MG/350ML IV SOLN: 1750.0000 mg | Freq: Two times a day (BID) | INTRAVENOUS | Status: DC

## 2022-09-09 NOTE — Progress Notes (Addendum)
Pharmacy Antibiotic Note  Bradley Mcclure is a 62 y.o. male admitted on 09/05/2022 with sepsis, PNA.  Pharmacy has been consulted for Vanco, Cefepime dosing.  Active Problem(s): - came via EMS due to syncopal episode with injury to the head.  - c/o several days of URI type symptoms, cough, chest congestion and eventually passing out.  - CT head- is negative for acute traumatic injury.   PMH: MRSA bacteremia, anxiety, depression, mild cognitive disorder, HTN, HLD, DM2, NAFLD, prior PE on chronic anticoagulation, obesity    ID: multifocal PNA, Sepsis -Leukocytosis worsening 21.8 (not on steroids),  Tmax 102.8 but currently afebrile, LA WNL  5/21 azithromycin >>5/25 5/21 ceftriaxone >>5/24 5/24 Vanco>> 5/24 Cefepime>>  5/21 Bcx: ngtd 5/20 Sputum Cx: ngtd 5/20, Fluvid, RSV: neg  Plan: Transferred to ICU D/c Rocephin - Cefepime 2g IV q8hr - Con't Azithro through 5/25 dose (5 total doses) - Vanco 2g IV x 1  Adden 1245: notified of increasing Scr: DECREASE Vancomycin 1250 mg IV Q 12 hrs. Goal AUC 400-550. Expected AUC: 426 SCr used: 1      Height: 6\' 1"  (185.4 cm) Weight: 103.1 kg (227 lb 4.7 oz) IBW/kg (Calculated) : 79.9  Temp (24hrs), Avg:99.3 F (37.4 C), Min:97.6 F (36.4 C), Max:102.8 F (39.3 C)  Recent Labs  Lab 09/05/22 2335 09/05/22 2350 09/06/22 0145 09/06/22 0505 09/07/22 0419 09/08/22 0351 09/09/22 0054 09/09/22 0626  WBC 13.4*  --   --  11.7* 11.4* 18.0* 21.8*  --   CREATININE 0.80  --   --  0.63 0.59* 0.67 0.86  --   LATICACIDVEN  --  1.9 1.5  --   --   --   --  1.2    Estimated Creatinine Clearance: 116.7 mL/min (by C-G formula based on SCr of 0.86 mg/dL).    No Known Allergies  Bradley Mcclure S. Merilynn Finland, PharmD, BCPS Clinical Staff Pharmacist [email or web address]  Pasty Spillers 09/09/2022 9:08 AM

## 2022-09-09 NOTE — Progress Notes (Signed)
   09/09/22 0653  BiPAP/CPAP/SIPAP  $ Non-Invasive Ventilator  Non-Invasive Vent Initial  $ Face Mask Medium Yes  BiPAP/CPAP/SIPAP Pt Type Adult  BiPAP/CPAP/SIPAP V60  Mask Type Full face mask  Mask Size Medium  Set Rate 0 breaths/min  Respiratory Rate 25 breaths/min  EPAP 8 cmH2O  FiO2 (%) 40 %  Minute Ventilation 30.5  Leak 10  Peak Inspiratory Pressure (PIP) 9  Tidal Volume (Vt) 899  Patient Home Equipment No  Auto Titrate No

## 2022-09-09 NOTE — Progress Notes (Signed)
   09/09/22 2328  BiPAP/CPAP/SIPAP  BiPAP/CPAP/SIPAP Pt Type Adult  BiPAP/CPAP/SIPAP V60  Mask Type Full face mask  Mask Size Medium  Set Rate 0 breaths/min  Respiratory Rate 27 breaths/min  EPAP 8 cmH2O  FiO2 (%) 40 %  Minute Ventilation 15  Leak 10  Peak Inspiratory Pressure (PIP) 9  Tidal Volume (Vt) 602  Patient Home Equipment No  Auto Titrate No  Press High Alarm 30 cmH2O  Press Low Alarm 5 cmH2O  CPAP/SIPAP surface wiped down Yes  BiPAP/CPAP /SiPAP Vitals  Pulse Rate (!) 117  Resp (!) 28  BP (!) 121/59  SpO2 96 %  Bilateral Breath Sounds Clear;Diminished  MEWS Score/Color  MEWS Score 4  MEWS Score Color Red   Pt tolerating it ok at this time. RN aware. Pt said he would wear for few hours.

## 2022-09-09 NOTE — Progress Notes (Signed)
    Patient Name: Bradley Mcclure           DOB: 1960-09-25  MRN: [id number]      Admission Date: 09/05/2022  Attending Provider: Leatha Gilding, MD  Primary Diagnosis: Multifocal pneumonia   Level of care: Telemetry    CROSS COVER NOTE   Date of Service   09/09/2022   [name], 62 y.o. male, was admitted on 09/05/2022 for Multifocal pneumonia.    HPI/Events of Note   Rapid response RN called regarding "RED MEWS" pt is tachypneic, tachycardic, afebrile, SpO2 93% on 4 L [place].  No acute distress reported, however patient recently received neb treatment for work of breathing. Concern with patient becoming fatigue due to tachypnea and intermittent work of breathing.  Patient remains alert and oriented x 4.  No acute changes. Bilateral lung sounds clear, bases diminished. Orders placed for VBG, chest x-ray. Chest x-ray - there is increased right upper lobe consolidation and patchy airspace disease in the mid to lower left lung field.  Continue abx tx and incentive spirometry use.  WBC 11.4--> 18--> 21.8 trending up despite IV azithromycin and Rocephin.  Afebrile. No blood culture growth.     Interventions/ Plan   Above        Anthoney Harada, DNP, [facility] [place]

## 2022-09-09 NOTE — Consult Note (Signed)
NAME:  Bradley Mcclure, MRN:  [id number], DOB:  02/12/1961, LOS: 3 ADMISSION DATE:  09/05/2022, CONSULTATION DATE:  09/09/22 REFERRING MD:  TRH, CHIEF COMPLAINT:  tachypnea   History of Present Illness:  62 year old man with worsening acidemia.  Review of admission labs indicates mild decrease in bicarbonate.  Noted to have glucosuria and ketonuria on admission.  Review of serial metabolic panels indicates declining levels of bicarbonate over the last few days since admission.  It appears she has had a lot of urine output, not as much intake.  Net negative quite a bit.  Transfer to stepdown.  Tachypneic.  Placed on BiPAP.  After initial ABG demonstrated pH 7. 25 with undetectably low pCO2.  Repeat blood gas essentially unchanged, as expected.  Suspect initial presentation consistent with DKA triggered by viral infection versus pneumonia with subsequent worsening of acidemia in the setting of ongoing diuresis due to glucosuria and SGL T2 inhibitor administration.  Pertinent  Medical History  Diabetes on insulin and SGLT2 inhibitor  Significant Hospital Events: Including procedures, antibiotic start and stop dates in addition to other pertinent events   5/24 for worsening acidemia, being bicarb since admission  Interim History / Subjective:    Objective   Blood pressure 135/63, pulse (!) 116, temperature 97.6 F (36.4 C), temperature source Oral, resp. rate (!) 30, height 6\' 1"  (1.854 m), weight 103.1 kg, SpO2 94 %.    FiO2 (%):  [40 %] 40 %   Intake/Output Summary (Last 24 hours) at 09/09/2022 1053 Last data filed at 09/09/2022 1037 Gross per 24 hour  Intake 1550 ml  Output 6150 ml  Net -4600 ml   Filed Weights   09/05/22 2259 09/07/22 0500 09/08/22 0500  Weight: 102.1 kg 104 kg 103.1 kg    Examination: General: Lying in bed, BiPAP mask in place HENT: Atraumatic normocephalic Lungs: Tachypneic, clear accessory muscle use Cardiovascular: Tachycardic, warm  Abdomen:  Nondistended Extremities: No edema Neuro: Alert oriented no focal deficits   Resolved Hospital Problem list     Assessment & Plan:  Metabolic acidosis: Likely mixed gap and nongap, with albumin anion gap around 17 when corrected.  He had ketonuria on admission UA.  He is on SGLT2 inhibitor, high suspicion for euglycemic DKA.  Versus starvation ketosis but it sounds like his oral intake is been reasonable.  Blood gas demonstrates as well of compensated respiratory alkalosis as possible with undetectably low pCO2. -- Obtain ketone level from blood -- If elevated strongly consider addition of IV insulin per DKA protocol  Case discussed in depth with primary team as well as respiratory therapy  Best Practice (right click and "Reselect all SmartList Selections" daily)   Per Primary  Labs   CBC: Recent Labs  Lab 09/05/22 2335 09/06/22 0505 09/07/22 0419 09/08/22 0351 09/09/22 0054  WBC 13.4* 11.7* 11.4* 18.0* 21.8*  NEUTROABS 12.0* 9.3*  --   --   --   HGB 14.7 13.3 13.9 13.7 14.6  HCT 42.6 40.1 41.3 41.9 44.9  MCV 92.8 94.1 95.2 96.1 96.1  PLT 139* 135* 126* 148* 189    Basic Metabolic Panel: Recent Labs  Lab 09/05/22 2335 09/06/22 0505 09/07/22 0419 09/08/22 0351 09/09/22 0054  NA 133* 136 137 132* 132*  K 3.0* 3.5 3.9 4.2 4.3  CL 100 103 104 103 105  CO2 20* 23 21* 14* 12*  GLUCOSE 262* 114* 121* 93 148*  BUN 13 10 11 10 19   CREATININE 0.80 0.63 0.59* 0.67 0.86  CALCIUM 8.8* 8.1* 8.5* 8.5* 9.7  MG  --  1.8  --  1.5* 1.9  PHOS  --   --   --   --  2.9   GFR: Estimated Creatinine Clearance: 116.7 mL/min (by C-G formula based on SCr of 0.86 mg/dL). Recent Labs  Lab 09/05/22 2350 09/06/22 0145 09/06/22 0505 09/07/22 0419 09/08/22 0351 09/09/22 0054 09/09/22 0626  WBC  --   --  11.7* 11.4* 18.0* 21.8*  --   LATICACIDVEN 1.9 1.5  --   --   --   --  1.2    Liver Function Tests: Recent Labs  Lab 09/05/22 2335 09/07/22 0419 09/08/22 0351  AST 18 27 21    ALT 14 16 16   ALKPHOS 60 60 68  BILITOT 1.2 1.2 1.7*  PROT 7.4 7.0 6.9  ALBUMIN 3.6 3.2* 3.0*   No results for input(s): "LIPASE", "AMYLASE" in the last 168 hours. No results for input(s): "AMMONIA" in the last 168 hours.  ABG    Component Value Date/Time   PHART 7.21 (L) 09/09/2022 0818   PCO2ART <18 (LL) 09/09/2022 0818   PO2ART 101 09/09/2022 0818   HCO3 6.4 (L) 09/09/2022 0818   TCO2 26 02/28/2016 1243   ACIDBASEDEF 19.1 (H) 09/09/2022 0818   O2SAT 98.8 09/09/2022 0818     Coagulation Profile: Recent Labs  Lab 09/05/22 2335  INR 1.4*    Cardiac Enzymes: No results for input(s): "CKTOTAL", "CKMB", "CKMBINDEX", "TROPONINI" in the last 168 hours.  HbA1C: Hgb A1c MFr Bld  Date/Time Value Ref Range Status  09/06/2022 05:05 AM 6.8 (H) 4.8 - 5.6 % Final    Comment:    (NOTE) Pre diabetes:          5.7%-6.4%  Diabetes:              >6.4%  Glycemic control for   <7.0% adults with diabetes   11/12/2015 01:52 AM 10.2 (H) 4.8 - 5.6 % Final    Comment:    (NOTE)         Pre-diabetes: 5.7 - 6.4         Diabetes: >6.4         Glycemic control for adults with diabetes: <7.0     CBG: Recent Labs  Lab 09/08/22 1206 09/08/22 1715 09/08/22 2114 09/09/22 0807 09/09/22 1027  GLUCAP 117* 127* 210* 186* 172*    Review of Systems:   No chest pain with exertion orthopnea or PND comprehensive review of systems otherwise negative  Past Medical History:  He,  has a past medical history of Balanitis (12/24/2015), Cellulitis and abscess (12/24/2015), Generalized anxiety disorder (09/06/2022), Hyperlipidemia (09/06/2022), Hypertension, IBS (irritable bowel syndrome), Major depressive disorder (09/06/2022), Mild cognitive disorder (09/06/2022), MRSA bacteremia (12/24/2015), Non-alcoholic fatty liver disease (16/01/9603), Pneumonia (2014-2015 X 1), Pulmonary embolism (HCC) (03/21/2013), Sepsis (HCC) (11/11/2015), and Type II diabetes mellitus (HCC).   Surgical History:    Past Surgical History:  Procedure Laterality Date   TEE WITHOUT CARDIOVERSION N/A 11/17/2015   Procedure: TRANSESOPHAGEAL ECHOCARDIOGRAM (TEE);  Surgeon: Chilton Si, MD;  Location: [facility];  Service: Cardiovascular;  Laterality: N/A;   TONSILLECTOMY  ~ 1977     Social History:   reports that he has never smoked. He has never used smokeless tobacco. He reports that he does not drink alcohol and does not use drugs.   Family History:  His family history includes Diabetes type II in his brother; Myasthenia gravis in his mother.   Allergies No Known Allergies  Home Medications  Prior to Admission medications   Medication Sig Start Date End Date Taking? Authorizing Provider  acetaminophen (TYLENOL) 325 MG tablet Take 650 mg by mouth every 6 (six) hours as needed for mild pain.    Yes [provider]  ARIPiprazole (ABILIFY) 2 MG tablet Take 2 mg by mouth daily. 05/11/22  Yes [provider]  empagliflozin (JARDIANCE) 25 MG TABS tablet Take 25 mg by mouth daily. 08/29/22  Yes [provider]  FLUoxetine (PROZAC) 20 MG capsule Take 20 mg by mouth 2 (two) times daily as needed (anxiety/depression). 02/02/22  Yes [provider]  insulin aspart (NOVOLOG) 100 UNIT/ML injection Inject 10-20 Units into the skin 2 (two) times daily.   Yes [provider]  losartan (COZAAR) 50 MG tablet Take 50 mg by mouth daily.   Yes [provider]  metFORMIN (GLUCOPHAGE) 500 MG tablet Take 1,000 mg by mouth 2 (two) times daily with a meal.    Yes [provider]  rivaroxaban (XARELTO) 20 MG TABS tablet Take 20 mg by mouth daily with supper.   Yes [provider]  Semaglutide, 2 MG/DOSE, 8 MG/3ML SOPN Inject 2 mg into the skin every 7 (seven) days. 08/29/22  Yes [provider]  HYDROcodone-acetaminophen (NORCO/VICODIN) 5-325 MG tablet Take 1-2 tablets by mouth every 6 (six) hours as needed for moderate pain. Patient not  taking: Reported on 09/06/2022 03/01/16   Althia Forts, MD  insulin glargine (LANTUS) 100 UNIT/ML injection Inject 0.75 mLs (75 Units total) into the skin 2 (two) times daily. Patient not taking: Reported on 09/06/2022 11/17/15   Vassie Loll, MD  mupirocin ointment (BACTROBAN) 2 % Place 1 application into the nose 2 (two) times daily. Patient not taking: Reported on 09/06/2022 12/24/15   Daiva Eves, Lisette Grinder, MD  polyethylene glycol [facility] / Ethelene Hal) packet Take 17 g by mouth daily. Patient not taking: Reported on 09/06/2022 03/02/16   Althia Forts, MD  senna (SENOKOT) 8.6 MG TABS tablet Take 2 tablets (17.2 mg total) by mouth 2 (two) times daily. Patient not taking: Reported on 09/06/2022 03/01/16   Althia Forts, MD  silver sulfADIAZINE (SILVADENE) 1 % cream Apply topically daily. Patient not taking: Reported on 09/06/2022 03/02/16   Althia Forts, MD  Skin Protectants, Misc. (INTERDRY AG TEXTILE 16"X096") SHEE Apply 2 packets topically once a week. Patient not taking: Reported on 09/06/2022 11/17/15   Vassie Loll, MD  sulfamethoxazole-trimethoprim (BACTRIM DS,SEPTRA DS) 800-160 MG tablet Take 2 tablets by mouth every 12 (twelve) hours. Patient not taking: Reported on 09/06/2022 03/01/16   Althia Forts, MD     Critical care time:     CRITICAL CARE Performed by: Karren Burly   Total critical care time: 31 minutes  Critical care time was exclusive of separately billable procedures and treating other patients.  Critical care was necessary to treat or prevent imminent or life-threatening deterioration.  Critical care was time spent personally by me on the following activities: development of treatment plan with patient and/or surrogate as well as nursing, discussions with consultants, evaluation of patient's response to treatment, examination of patient, obtaining history from patient or surrogate, ordering and performing treatments and interventions, ordering and review of  laboratory studies, ordering and review of radiographic studies, pulse oximetry and re-evaluation of patient's condition.  Karren Burly, MD

## 2022-09-09 NOTE — Progress Notes (Signed)
PROGRESS NOTE  Bradley Mcclure ZOX:[id number] DOB: Dec 05, 1960 DOA: 09/05/2022 PCP: [facility]   LOS: 3 days   Brief Narrative / Interim history: 62 year old male with prior history of MRSA bacteremia, anxiety, depression, mild cognitive disorder, HTN, HLD, DM2, NAFLD, prior PE on chronic anticoagulation, obesity comes to the hospital with several days of URI type symptoms, cough, chest congestion and eventually passing out.  In the ED he was found to have right lower lobe pneumonia, he was started on antibiotics and admitted to the hospital  Subjective / 24h Interval events: Responded stat this morning to rapid response due to tachycardia, increased work of breathing  Assesement and Plan: Principal Problem:   Multifocal pneumonia Active Problems:   Hyperlipidemia   Major depressive disorder   Pulmonary embolism (HCC)   Essential hypertension   Type 2 diabetes mellitus (HCC)   GERD (gastroesophageal reflux disease)   Principal problem Sepsis due to multifocal pneumonia -met sepsis criteria with leukocytosis, fever, tachycardia, tachypnea, and a source as evidenced on imaging, patient was started on ceftriaxone and azithromycin.  On 5/20 4 AM, due to increasing leukocytosis and increasing work of breathing patient was transferred to stepdown and antibiotics were broadened to vancomycin and cefepime.  Continue -Continue BiPAP  Active problems Euglycemic DKA -ABG this morning to evaluate respiratory status showed significant metabolic acidosis partially compensated by respiratory alkalosis.  Initially there was no apparent cause for his metabolic acidosis with normal lactic acid, borderline anion gap, however beta hydroxybutyrate was found to be elevated suggesting euglycemic DKA.  Patient is on Jardiance at home, but he has been off of it while here.  I wonder whether this was triggered more so by #1 -Insulin drip for now, monitor BMPs, butyrate  History of PE-continue  Xarelto  Essential hypertension-continue losartan.  2D echo done 5/22 shows normal LVEF 60 to 65%, no WMA, LVH, RV was normal.  Obesity, borderline-BMI 30.25  DM2-continue home regimen  Lab Results  Component Value Date   HGBA1C 6.8 (H) 09/06/2022   CBG (last 3)  Recent Labs    09/08/22 2114 09/09/22 0807 09/09/22 1027  GLUCAP 210* 186* 172*    Thrombocytopenia-possibly in the setting of acute illness  History of depression-continue Abilify  Scheduled Meds:  ARIPiprazole  2 mg Oral Daily   Chlorhexidine Gluconate Cloth  6 each Topical Daily   ipratropium-albuterol  3 mL Nebulization TID   losartan  50 mg Oral Daily   metFORMIN  1,000 mg Oral BID WC   metoprolol tartrate  25 mg Oral BID   rivaroxaban  20 mg Oral Q supper   sodium chloride flush  3 mL Intravenous Q12H   Continuous Infusions:  azithromycin 500 mg (09/09/22 0109)   ceFEPime (MAXIPIME) IV Stopped (09/09/22 0953)   dextrose 5% lactated ringers 50 mL/hr at 09/09/22 1035   insulin 6 Units/hr (09/09/22 1034)   lactated ringers Stopped (09/09/22 1036)   potassium chloride 10 mEq (09/09/22 1041)   vancomycin     vancomycin 2,000 mg (09/09/22 0955)   PRN Meds:.acetaminophen, albuterol, dextrose, guaiFENesin, metoprolol tartrate, ondansetron (ZOFRAN) IV, mouth rinse, sodium chloride  Current Outpatient Medications  Medication Instructions   acetaminophen (TYLENOL) 650 mg, Oral, Every 6 hours PRN   ARIPiprazole (ABILIFY) 2 mg, Oral, Daily   empagliflozin (JARDIANCE) 25 mg, Oral, Daily   FLUoxetine (PROZAC) 20 mg, Oral, 2 times daily PRN   HYDROcodone-acetaminophen (NORCO/VICODIN) 5-325 MG tablet 1-2 tablets, Oral, Every 6 hours PRN   insulin aspart (NOVOLOG)  10-20 Units, Subcutaneous, 2 times daily   insulin glargine (LANTUS) 75 Units, Subcutaneous, 2 times daily   losartan (COZAAR) 50 mg, Oral, Daily   metFORMIN (GLUCOPHAGE) 1,000 mg, Oral, 2 times daily with meals   mupirocin ointment (BACTROBAN) 2 % 1  application , Nasal, 2 times daily   polyethylene glycol (MIRALAX / GLYCOLAX) 17 g, Oral, Daily   rivaroxaban (XARELTO) 20 mg, Oral, Daily with supper   Semaglutide (2 MG/DOSE) 2 mg, Subcutaneous, Every 7 days   senna (SENOKOT) 17.2 mg, Oral, 2 times daily   silver sulfADIAZINE (SILVADENE) 1 % cream Topical, Daily   Skin Protectants, Misc. (INTERDRY AG TEXTILE 16"X096") SHEE 2 packets, Apply externally, Weekly   sulfamethoxazole-trimethoprim (BACTRIM DS,SEPTRA DS) 800-160 MG tablet 2 tablets, Oral, Every 12 hours    Diet Orders (From admission, onward)     Start     Ordered   09/09/22 1020  Diet NPO time specified  (Diabetes Ketoacidosis (DKA))  Diet effective now        09/09/22 1020            DVT prophylaxis:  rivaroxaban (XARELTO) tablet 20 mg   Lab Results  Component Value Date   PLT 189 09/09/2022      Code Status: Full Code  Family Communication: no family at bedside   Status is: Inpatient Remains inpatient appropriate because: severity of illness   Level of care: Stepdown  Consultants:  none  Objective: Vitals:   09/09/22 0416 09/09/22 0600 09/09/22 0730 09/09/22 0800  BP: 128/66 (!) 143/111  135/63  Pulse: (!) 115 (!) 123  (!) 116  Resp: (!) 35 (!) 38  (!) 30  Temp: 97.6 F (36.4 C)     TempSrc: Oral     SpO2: 90% 91% 97% 94%  Weight:      Height:        Intake/Output Summary (Last 24 hours) at 09/09/2022 1050 Last data filed at 09/09/2022 1037 Gross per 24 hour  Intake 1550 ml  Output 6150 ml  Net -4600 ml    Wt Readings from Last 3 Encounters:  09/08/22 103.1 kg  04/25/16 135.6 kg  03/01/16 (!) 137 kg    Examination:  Constitutional: NAD Eyes: lids and conjunctivae normal, no scleral icterus ENMT: mmm Neck: normal, supple Respiratory: Breathing with the BiPAP, bilateral rhonchi present. Cardiovascular: Regular rate and rhythm, no murmurs / rubs / gallops. No LE edema. Abdomen: soft, no distention, no tenderness. Bowel sounds  positive.  Skin: no rashes  Data Reviewed: I have independently reviewed following labs and imaging studies   CBC Recent Labs  Lab 09/05/22 2335 09/06/22 0505 09/07/22 0419 09/08/22 0351 09/09/22 0054  WBC 13.4* 11.7* 11.4* 18.0* 21.8*  HGB 14.7 13.3 13.9 13.7 14.6  HCT 42.6 40.1 41.3 41.9 44.9  PLT 139* 135* 126* 148* 189  MCV 92.8 94.1 95.2 96.1 96.1  MCH 32.0 31.2 32.0 31.4 31.3  MCHC 34.5 33.2 33.7 32.7 32.5  RDW 13.0 13.0 13.2 13.4 13.8  LYMPHSABS 0.4* 1.1  --   --   --   MONOABS 0.9 1.1*  --   --   --   EOSABS 0.0 0.1  --   --   --   BASOSABS 0.0 0.0  --   --   --      Recent Labs  Lab 09/05/22 0059 09/05/22 2335 09/05/22 2350 09/06/22 0145 09/06/22 0505 09/07/22 0419 09/08/22 0351 09/09/22 0054 09/09/22 0626  NA  --  133*  --   --  136 137 132* 132*  --   K  --  3.0*  --   --  3.5 3.9 4.2 4.3  --   CL  --  100  --   --  103 104 103 105  --   CO2  --  20*  --   --  23 21* 14* 12*  --   GLUCOSE  --  262*  --   --  114* 121* 93 148*  --   BUN  --  13  --   --  10 11 10 19   --   CREATININE  --  0.80  --   --  0.63 0.59* 0.67 0.86  --   CALCIUM  --  8.8*  --   --  8.1* 8.5* 8.5* 9.7  --   AST  --  18  --   --   --  27 21  --   --   ALT  --  14  --   --   --  16 16  --   --   ALKPHOS  --  60  --   --   --  60 68  --   --   BILITOT  --  1.2  --   --   --  1.2 1.7*  --   --   ALBUMIN  --  3.6  --   --   --  3.2* 3.0*  --   --   MG  --   --   --   --  1.8  --  1.5* 1.9  --   LATICACIDVEN  --   --  1.9 1.5  --   --   --   --  1.2  INR  --  1.4*  --   --   --   --   --   --   --   HGBA1C  --   --   --   --  6.8*  --   --   --   --   BNP 61.7  --   --   --   --   --   --   --   --      ------------------------------------------------------------------------------------------------------------------ No results for input(s): "CHOL", "HDL", "LDLCALC", "TRIG", "CHOLHDL", "LDLDIRECT" in the last 72 hours.  Lab Results  Component Value Date   HGBA1C 6.8 (H)  09/06/2022   ------------------------------------------------------------------------------------------------------------------ No results for input(s): "TSH", "T4TOTAL", "T3FREE", "THYROIDAB" in the last 72 hours.  Invalid input(s): "FREET3"  Cardiac Enzymes No results for input(s): "CKMB", "TROPONINI", "MYOGLOBIN" in the last 168 hours.  Invalid input(s): "CK" ------------------------------------------------------------------------------------------------------------------    Component Value Date/Time   BNP 61.7 09/05/2022 0059    CBG: Recent Labs  Lab 09/08/22 1206 09/08/22 1715 09/08/22 2114 09/09/22 0807 09/09/22 1027  GLUCAP 117* 127* 210* 186* 172*     Recent Results (from the past 240 hour(s))  Culture, blood (Routine x 2)     Status: None (Preliminary result)   Collection Time: 09/05/22 11:15 PM   Specimen: BLOOD  Result Value Ref Range Status   Specimen Description   Final    BLOOD SITE NOT SPECIFIED Performed at [facility], [address] [place], [place] [address]    Special Requests   Final    BOTTLES DRAWN AEROBIC AND ANAEROBIC Blood Culture results may not be optimal due to an excessive volume of blood received in culture bottles Performed at [facility], [address], [place], [place] [address]    Culture   Final    NO GROWTH 3 DAYS Performed at [facility], [address] [place], [place] [address]    Report Status PENDING  Incomplete  Resp panel by RT-PCR (RSV, Flu A&B, Covid) Anterior Nasal Swab     Status: None   Collection Time: 09/05/22 11:17 PM   Specimen: Anterior Nasal Swab  Result Value Ref Range Status   SARS Coronavirus 2 by RT PCR NEGATIVE NEGATIVE Final    Comment: (NOTE) SARS-CoV-2 target nucleic acids are NOT DETECTED.  The SARS-CoV-2 RNA is generally detectable in upper respiratory specimens during the acute phase of infection. The lowest concentration of SARS-CoV-2 viral  copies this assay can detect is 138 copies/mL. A negative result does not preclude SARS-Cov-2 infection and should not be used as the sole basis for treatment or other patient management decisions. A negative result may occur with  improper specimen collection/handling, submission of specimen other than nasopharyngeal swab, presence of viral mutation(s) within the areas targeted by this assay, and inadequate number of viral copies(<138 copies/mL). A negative result must be combined with clinical observations, patient history, and epidemiological information. The expected result is Negative.  Fact Sheet for Patients:  BloggerCourse.com  Fact Sheet for Healthcare Providers:  SeriousBroker.it  This test is no t yet approved or cleared by the [place] FDA and  has been authorized for detection and/or diagnosis of SARS-CoV-2 by FDA under an Emergency Use Authorization (EUA). This EUA will remain  in effect (meaning this test can be used) for the duration of the COVID-19 declaration under Section 564(b)(1) of the Act, 21 U.S.C.section 360bbb-3(b)(1), unless the authorization is terminated  or revoked sooner.       Influenza A by PCR NEGATIVE NEGATIVE Final   Influenza B by PCR NEGATIVE NEGATIVE Final    Comment: (NOTE) The Xpert Xpress SARS-CoV-2/FLU/RSV plus assay is intended as an aid in the diagnosis of influenza from Nasopharyngeal swab specimens and should not be used as a sole basis for treatment. Nasal washings and aspirates are unacceptable for Xpert Xpress SARS-CoV-2/FLU/RSV testing.  Fact Sheet for Patients: BloggerCourse.com  Fact Sheet for Healthcare Providers: SeriousBroker.it  This test is not yet approved or cleared by the [place] FDA and has been authorized for detection and/or diagnosis of SARS-CoV-2 by FDA under an Emergency Use Authorization (EUA). This  EUA will remain in effect (meaning this test can be used) for the duration of the COVID-19 declaration under Section 564(b)(1) of the Act, 21 U.S.C. section 360bbb-3(b)(1), unless the authorization is terminated or revoked.     Resp Syncytial Virus by PCR NEGATIVE NEGATIVE Final    Comment: (NOTE) Fact Sheet for Patients: BloggerCourse.com  Fact Sheet for Healthcare Providers: SeriousBroker.it  This test is not yet approved or cleared by the [place] FDA and has been authorized for detection and/or diagnosis of SARS-CoV-2 by FDA under an Emergency Use Authorization (EUA). This EUA will remain in effect (meaning this test can be used) for the duration of the COVID-19 declaration under Section 564(b)(1) of the Act, 21 U.S.C. section 360bbb-3(b)(1), unless the authorization is terminated or revoked.  Performed at [facility], [address] [place], [place] [address]   Culture, blood (Routine x 2)     Status: None (Preliminary result)   Collection Time: 09/06/22 12:25 AM   Specimen: BLOOD  Result Value Ref Range Status   Specimen Description  Final    BLOOD SITE NOT SPECIFIED Performed at [facility], [address] [place], [place] [address]    Special Requests   Final    BOTTLES DRAWN AEROBIC AND ANAEROBIC Blood Culture adequate volume Performed at [facility], [address] [place], [place] [address]    Culture   Final    NO GROWTH 3 DAYS Performed at [facility], [address], [place], [place] [address]    Report Status PENDING  Incomplete     Radiology Studies: DG Chest Port 1 View  Result Date: 09/08/2022 CLINICAL DATA:  Shortness of breath. EXAM: PORTABLE CHEST 1 VIEW COMPARISON:  09/05/2022, 09/06/2022. FINDINGS: Heart is enlarged and the mediastinal contour stable. The pulmonary vasculature is distended. Patchy airspace disease is noted  in the mid to lower left lung. There is consolidation in the right upper lobe. No effusion or pneumothorax. No acute osseous abnormality. IMPRESSION: 1. Cardiomegaly with mildly distended pulmonary vasculature. 2. Increased right upper lobe consolidation with patchy airspace disease in the mid to lower left lung field, suspicious for pneumonia. Electronically Signed   By: Thornell Sartorius M.D.   On: 09/08/2022 23:08     Pamella Pert, MD, PhD [facility]  Between 7 am - 7 pm I am available, please contact me via Amion (for emergencies) or Securechat (non urgent messages)  Between 7 pm - 7 am I am not available, please contact night coverage MD/APP via Amion

## 2022-09-09 NOTE — Plan of Care (Signed)
  Problem: Activity: Goal: Ability to tolerate increased activity will improve Outcome: Not Progressing   Problem: Clinical Measurements: Goal: Ability to maintain a body temperature in the normal range will improve Outcome: Not Progressing   Problem: Respiratory: Goal: Ability to maintain adequate ventilation will improve Outcome: Not Progressing   Problem: Education: Goal: Knowledge of General Education information will improve Description: Including pain rating scale, medication(s)/side effects and non-pharmacologic comfort measures Outcome: Not Progressing   Problem: Health Behavior/Discharge Planning: Goal: Ability to manage health-related needs will improve Outcome: Not Progressing

## 2022-09-09 NOTE — Inpatient Diabetes Management (Signed)
Inpatient Diabetes Program Recommendations  AACE/ADA: New Consensus Statement on Inpatient Glycemic Control (2015)  Target Ranges:  Prepandial:   less than 140 mg/dL      Peak postprandial:   less than 180 mg/dL (1-2 hours)      Critically ill patients:  140 - 180 mg/dL   Lab Results  Component Value Date   GLUCAP 115 (H) 09/09/2022   HGBA1C 6.8 (H) 09/06/2022    Review of Glycemic Control  Diabetes history: DM2 Outpatient Diabetes medications: Jardiance 25 mg QD, Novolog 10-20 BID, metformin 1000 mg BID, Ozempic 2 mg weekly, previously on Lantus 75 units BID (not taking at present) Current orders for Inpatient glycemic control: IV insulin per [employer] for DKA  HgbA1C - 6.8% BHB - 7.84, CO2 12, AG 15 Euglycemic DKA  Inpatient Diabetes Program Recommendations:    Continue with IV insulin until criteria is met for discontinuation.   Attempted to speak with pt at bedside and he was groggy, couldn't carry on conversation. Will speak with pt when appropriate.   Will continue to follow.  Thank you. Ailene Ards, RD, LDN, [facility] Inpatient Diabetes Coordinator [phone]

## 2022-09-10 ENCOUNTER — Inpatient Hospital Stay (HOSPITAL_COMMUNITY): Payer: Medicare PPO

## 2022-09-10 DIAGNOSIS — J189 Pneumonia, unspecified organism: Secondary | ICD-10-CM | POA: Diagnosis not present

## 2022-09-10 LAB — GLUCOSE, CAPILLARY
Glucose-Capillary: 115 mg/dL — ABNORMAL HIGH (ref 70–99)
Glucose-Capillary: 116 mg/dL — ABNORMAL HIGH (ref 70–99)
Glucose-Capillary: 117 mg/dL — ABNORMAL HIGH (ref 70–99)
Glucose-Capillary: 118 mg/dL — ABNORMAL HIGH (ref 70–99)
Glucose-Capillary: 119 mg/dL — ABNORMAL HIGH (ref 70–99)
Glucose-Capillary: 124 mg/dL — ABNORMAL HIGH (ref 70–99)
Glucose-Capillary: 124 mg/dL — ABNORMAL HIGH (ref 70–99)
Glucose-Capillary: 126 mg/dL — ABNORMAL HIGH (ref 70–99)
Glucose-Capillary: 130 mg/dL — ABNORMAL HIGH (ref 70–99)
Glucose-Capillary: 131 mg/dL — ABNORMAL HIGH (ref 70–99)
Glucose-Capillary: 134 mg/dL — ABNORMAL HIGH (ref 70–99)
Glucose-Capillary: 135 mg/dL — ABNORMAL HIGH (ref 70–99)
Glucose-Capillary: 137 mg/dL — ABNORMAL HIGH (ref 70–99)
Glucose-Capillary: 137 mg/dL — ABNORMAL HIGH (ref 70–99)
Glucose-Capillary: 138 mg/dL — ABNORMAL HIGH (ref 70–99)
Glucose-Capillary: 140 mg/dL — ABNORMAL HIGH (ref 70–99)
Glucose-Capillary: 141 mg/dL — ABNORMAL HIGH (ref 70–99)
Glucose-Capillary: 141 mg/dL — ABNORMAL HIGH (ref 70–99)
Glucose-Capillary: 144 mg/dL — ABNORMAL HIGH (ref 70–99)
Glucose-Capillary: 145 mg/dL — ABNORMAL HIGH (ref 70–99)
Glucose-Capillary: 146 mg/dL — ABNORMAL HIGH (ref 70–99)
Glucose-Capillary: 148 mg/dL — ABNORMAL HIGH (ref 70–99)
Glucose-Capillary: 149 mg/dL — ABNORMAL HIGH (ref 70–99)

## 2022-09-10 LAB — CBC
HCT: 43.7 % (ref 39.0–52.0)
Hemoglobin: 14.4 g/dL (ref 13.0–17.0)
MCH: 31.5 pg (ref 26.0–34.0)
MCHC: 33 g/dL (ref 30.0–36.0)
MCV: 95.6 fL (ref 80.0–100.0)
Platelets: 184 10*3/uL (ref 150–400)
RBC: 4.57 MIL/uL (ref 4.22–5.81)
RDW: 14.3 % (ref 11.5–15.5)
WBC: 17.8 10*3/uL — ABNORMAL HIGH (ref 4.0–10.5)
nRBC: 0 % (ref 0.0–0.2)

## 2022-09-10 LAB — BASIC METABOLIC PANEL
Anion gap: 13 (ref 5–15)
Anion gap: 15 (ref 5–15)
Anion gap: 13 (ref 5–15)
BUN: 27 mg/dL — ABNORMAL HIGH (ref 6–20)
BUN: 25 mg/dL — ABNORMAL HIGH (ref 6–20)
BUN: 24 mg/dL — ABNORMAL HIGH (ref 6–20)
CO2: 10 mmol/L — ABNORMAL LOW (ref 22–32)
CO2: 13 mmol/L — ABNORMAL LOW (ref 22–32)
CO2: 15 mmol/L — ABNORMAL LOW (ref 22–32)
Calcium: 9.1 mg/dL (ref 8.9–10.3)
Calcium: 9.5 mg/dL (ref 8.9–10.3)
Calcium: 9.6 mg/dL (ref 8.9–10.3)
Chloride: 110 mmol/L (ref 98–111)
Chloride: 112 mmol/L — ABNORMAL HIGH (ref 98–111)
Chloride: 113 mmol/L — ABNORMAL HIGH (ref 98–111)
Creatinine, Ser: 0.72 mg/dL (ref 0.61–1.24)
Creatinine, Ser: 0.66 mg/dL (ref 0.61–1.24)
Creatinine, Ser: 0.63 mg/dL (ref 0.61–1.24)
GFR, Estimated: >60 mL/min (ref >60)
GFR, Estimated: >60 mL/min (ref >60)
GFR, Estimated: >60 mL/min (ref >60)
Glucose, Bld: 131 mg/dL — ABNORMAL HIGH (ref 70–99)
Glucose, Bld: 145 mg/dL — ABNORMAL HIGH (ref 70–99)
Glucose, Bld: 144 mg/dL — ABNORMAL HIGH (ref 70–99)
Potassium: 4.8 mmol/L (ref 3.5–5.1)
Potassium: 3.6 mmol/L (ref 3.5–5.1)
Potassium: 3.4 mmol/L — ABNORMAL LOW (ref 3.5–5.1)
Sodium: 133 mmol/L — ABNORMAL LOW (ref 135–145)
Sodium: 140 mmol/L (ref 135–145)
Sodium: 141 mmol/L (ref 135–145)

## 2022-09-10 LAB — BLOOD GAS, ARTERIAL
Acid-base deficit: 17.7 mmol/L — ABNORMAL HIGH (ref 0.0–2.0)
Bicarbonate: 7.3 mmol/L — ABNORMAL LOW (ref 20.0–28.0)
Drawn by: 56037
O2 Content: 4 L/min
Patient temperature: 36.4
pH, Arterial: 7.25 — ABNORMAL LOW (ref 7.35–7.45)
pO2, Arterial: 69 mmHg — ABNORMAL LOW (ref 83–108)

## 2022-09-10 LAB — CULTURE, BLOOD (ROUTINE X 2)

## 2022-09-10 LAB — BETA-HYDROXYBUTYRIC ACID
Beta-Hydroxybutyric Acid: 5.27 mmol/L — ABNORMAL HIGH (ref 0.05–0.27)
Beta-Hydroxybutyric Acid: 5.85 mmol/L — ABNORMAL HIGH (ref 0.05–0.27)
Beta-Hydroxybutyric Acid: 4.17 mmol/L — ABNORMAL HIGH (ref 0.05–0.27)

## 2022-09-10 LAB — MAGNESIUM: Magnesium: 2 mg/dL (ref 1.7–2.4)

## 2022-09-10 MED ORDER — AMLODIPINE BESYLATE 10 MG PO TABS
10.0000 mg | ORAL_TABLET | Freq: Every day | ORAL | Status: DC
  Administered 2022-09-10 – 2022-09-19 (×10): 10 mg via ORAL
  Filled 2022-09-10 (×10): qty 1

## 2022-09-10 MED ORDER — DEXTROSE-SODIUM CHLORIDE 5-0.45 % IV SOLN: INTRAVENOUS | Status: DC

## 2022-09-10 NOTE — Plan of Care (Signed)
Problem: Education: Goal: Ability to describe self-care measures that may prevent or decrease complications (Diabetes Survival Skills Education) will improve Outcome: Progressing Goal: Individualized Educational Video(s) Outcome: Progressing   Problem: Coping: Goal: Ability to adjust to condition or change in health will improve Outcome: Progressing   Problem: Fluid Volume: Goal: Ability to maintain a balanced intake and output will improve Outcome: Progressing   Problem: Health Behavior/Discharge Planning: Goal: Ability to identify and utilize available resources and services will improve Outcome: Progressing Goal: Ability to manage health-related needs will improve Outcome: Progressing   Problem: Metabolic: Goal: Ability to maintain appropriate glucose levels will improve Outcome: Progressing   Problem: Nutritional: Goal: Maintenance of adequate nutrition will improve Outcome: Progressing Goal: Progress toward achieving an optimal weight will improve Outcome: Progressing   Problem: Skin Integrity: Goal: Risk for impaired skin integrity will decrease Outcome: Progressing   Problem: Tissue Perfusion: Goal: Adequacy of tissue perfusion will improve Outcome: Progressing   Problem: Activity: Goal: Ability to tolerate increased activity will improve Outcome: Progressing   Problem: Clinical Measurements: Goal: Ability to maintain a body temperature in the normal range will improve Outcome: Progressing   Problem: Respiratory: Goal: Ability to maintain adequate ventilation will improve Outcome: Progressing Goal: Ability to maintain a clear airway will improve Outcome: Progressing   Problem: Education: Goal: Knowledge of General Education information will improve Description: Including pain rating scale, medication(s)/side effects and non-pharmacologic comfort measures Outcome: Progressing   Problem: Health Behavior/Discharge Planning: Goal: Ability to manage  health-related needs will improve Outcome: Progressing   Problem: Clinical Measurements: Goal: Ability to maintain clinical measurements within normal limits will improve Outcome: Progressing Goal: Will remain free from infection Outcome: Progressing Goal: Diagnostic test results will improve Outcome: Progressing Goal: Respiratory complications will improve Outcome: Progressing Goal: Cardiovascular complication will be avoided Outcome: Progressing   Problem: Activity: Goal: Risk for activity intolerance will decrease Outcome: Progressing   Problem: Nutrition: Goal: Adequate nutrition will be maintained Outcome: Progressing   Problem: Coping: Goal: Level of anxiety will decrease Outcome: Progressing   Problem: Elimination: Goal: Will not experience complications related to bowel motility Outcome: Progressing Goal: Will not experience complications related to urinary retention Outcome: Progressing   Problem: Pain Managment: Goal: General experience of comfort will improve Outcome: Progressing   Problem: Safety: Goal: Ability to remain free from injury will improve Outcome: Progressing   Problem: Skin Integrity: Goal: Risk for impaired skin integrity will decrease Outcome: Progressing   Problem: Education: Goal: Knowledge of condition and prescribed therapy will improve Outcome: Progressing   Problem: Cardiac: Goal: Will achieve and/or maintain adequate cardiac output Outcome: Progressing   Problem: Physical Regulation: Goal: Complications related to the disease process, condition or treatment will be avoided or minimized Outcome: Progressing   Problem: Education: Goal: Ability to describe self-care measures that may prevent or decrease complications (Diabetes Survival Skills Education) will improve Outcome: Progressing Goal: Individualized Educational Video(s) Outcome: Progressing   Problem: Cardiac: Goal: Ability to maintain an adequate cardiac output  will improve Outcome: Progressing   Problem: Health Behavior/Discharge Planning: Goal: Ability to identify and utilize available resources and services will improve Outcome: Progressing Goal: Ability to manage health-related needs will improve Outcome: Progressing   Problem: Fluid Volume: Goal: Ability to achieve a balanced intake and output will improve Outcome: Progressing   Problem: Metabolic: Goal: Ability to maintain appropriate glucose levels will improve Outcome: Progressing   Problem: Nutritional: Goal: Maintenance of adequate nutrition will improve Outcome: Progressing Goal: Maintenance of adequate weight for body  size and type will improve Outcome: Progressing   Problem: Respiratory: Goal: Will regain and/or maintain adequate ventilation Outcome: Progressing   Problem: Urinary Elimination: Goal: Ability to achieve and maintain adequate renal perfusion and functioning will improve Outcome: Progressing

## 2022-09-10 NOTE — Progress Notes (Signed)
   09/10/22 2002  BiPAP/CPAP/SIPAP  BiPAP/CPAP/SIPAP Pt Type  (standby)  BiPAP/CPAP/SIPAP V60  Mask Type Full face mask  Mask Size Medium  Set Rate 0 breaths/min  Respiratory Rate 22 breaths/min  EPAP 8 cmH2O  FiO2 (%) 40 %  BiPAP/CPAP /SiPAP Vitals  SpO2 96 %  Bilateral Breath Sounds Clear;Diminished   V60 standby pt is not ready to wear it yet. Pt is currently on 4L [place] and doing well.

## 2022-09-10 NOTE — Progress Notes (Signed)
   09/10/22 0353  BiPAP/CPAP/SIPAP  BiPAP/CPAP/SIPAP Pt Type Adult  BiPAP/CPAP/SIPAP V60  Mask Type Full face mask  Mask Size Medium  Set Rate 0 breaths/min  Respiratory Rate 22 breaths/min  EPAP 8 cmH2O  FiO2 (%) 40 %  Minute Ventilation 21  Leak 10  Peak Inspiratory Pressure (PIP) 9  Tidal Volume (Vt) 881  Patient Home Equipment No  Auto Titrate No  Press High Alarm 30 cmH2O  Press Low Alarm 5 cmH2O  BiPAP/CPAP /SiPAP Vitals  Pulse Rate (!) 121  Resp (!) 22  BP (!) 150/72  SpO2 96 %  Bilateral Breath Sounds Clear;Diminished  MEWS Score/Color  MEWS Score 3  MEWS Score Color Yellow

## 2022-09-10 NOTE — Inpatient Diabetes Management (Signed)
Inpatient Diabetes Program Recommendations  AACE/ADA: New Consensus Statement on Inpatient Glycemic Control (2015)  Target Ranges:  Prepandial:   less than 140 mg/dL      Peak postprandial:   less than 180 mg/dL (1-2 hours)      Critically ill patients:  140 - 180 mg/dL    Latest Reference Range & Units 09/10/22 02:01  Sodium 135 - 145 mmol/L 133 (L)  Potassium 3.5 - 5.1 mmol/L 4.8  Chloride 98 - 111 mmol/L 110  CO2 22 - 32 mmol/L 10 (L)  Glucose 70 - 99 mg/dL 098 (H)  BUN 6 - 20 mg/dL 27 (H)  Creatinine 1.19 - 1.24 mg/dL 1.47  Calcium 8.9 - 82.9 mg/dL 9.1  Anion gap 5 - 15  13  (L): Data is abnormally low (H): Data is abnormally high  Latest Reference Range & Units 09/10/22 02:01  Beta-Hydroxybutyric Acid 0.05 - 0.27 mmol/L 5.27 (H)  (H): Data is abnormally high    History: DM2  Home DM Meds: Jardiance 25 mg daily        Novolog 10-20 units BID        Metformin 1000 mg BID        Ozempic 2 mg qweek        Lantus 75 units BID (NOT taking the Lantus)  Current Orders: IV Insulin Drip    MD- Note CO2 remains low this AM at 10--Beta-Hydroxy level was 5.27 at 2am today  Please consider keeping pt on the IV Insulin Drip until CO2 closer to 20  May consider increasing the rate of the D5% LR IVF      --Will follow patient during hospitalization--  Ambrose Finland RN, MSN, [facility] Diabetes Coordinator Inpatient Glycemic Control Team Team Pager: [phone] (8a-5p)

## 2022-09-10 NOTE — Progress Notes (Signed)
PROGRESS NOTE  Bradley Mcclure NFA:[id number] DOB: [birth year] DOA: 09/05/2022 PCP: [facility]   LOS: 4 days   Brief Narrative / Interim history: 62 year old male with prior history of MRSA bacteremia, anxiety, depression, mild cognitive disorder, HTN, HLD, DM2, NAFLD, prior PE on chronic anticoagulation, obesity comes to the hospital with several days of URI type symptoms, cough, chest congestion and eventually passing out.  In the ED he was found to have right lower lobe pneumonia, he was started on antibiotics and admitted to the hospital  Subjective / 24h Interval events: He is doing well this morning.  Denies any shortness of breath.  Alert, slow to respond but appears at baseline  Assesement and Plan: Principal Problem:   Multifocal pneumonia Active Problems:   Hyperlipidemia   Major depressive disorder   Pulmonary embolism (HCC)   Essential hypertension   Type 2 diabetes mellitus (HCC)   GERD (gastroesophageal reflux disease)   Principal problem Sepsis due to multifocal pneumonia -met sepsis criteria with leukocytosis, fever, tachycardia, tachypnea, and a source as evidenced on imaging, patient was started on ceftriaxone and azithromycin.  On 5/20 4 AM, due to increasing leukocytosis and increasing work of breathing patient was transferred to stepdown and antibiotics were broadened to vancomycin and cefepime.  Continue for now -Continue BiPAP intermittently, he required his overnight but is comfortable on nasal cannula this morning.  Will repeat imaging of the chest with a CT scan  Active problems Euglycemic DKA -ABG on 5/24 showed significant metabolic acidosis partially compensated by respiratory alkalosis.  Initially there was no apparent cause for his metabolic acidosis with normal lactic acid, borderline anion gap, however beta hydroxybutyrate was found to be elevated suggesting euglycemic DKA.  Patient is on Jardiance at home, but he has been off of it while  here.  I wonder whether this was triggered more so by #1 -Continue insulin drip, bicarb still low, butyrate is decreasing  History of PE-continue Xarelto  Essential hypertension-continue losartan.  2D echo done 5/22 shows normal LVEF 60 to 65%, no WMA, LVH, RV was normal.  Obesity, borderline-BMI 30.25  DM2-continue home regimen  Lab Results  Component Value Date   HGBA1C 6.8 (H) 09/06/2022   CBG (last 3)  Recent Labs    09/10/22 0839 09/10/22 0938 09/10/22 1030  GLUCAP 130* 126* 124*    Thrombocytopenia-possibly in the setting of acute illness  History of depression-continue Abilify  Scheduled Meds:  ARIPiprazole  2 mg Oral Daily   Chlorhexidine Gluconate Cloth  6 each Topical Daily   ipratropium-albuterol  3 mL Nebulization BID   losartan  50 mg Oral Daily   metoprolol tartrate  25 mg Oral BID   rivaroxaban  20 mg Oral Q supper   sodium chloride flush  3 mL Intravenous Q12H   Continuous Infusions:  ceFEPime (MAXIPIME) IV Stopped (09/10/22 1013)   dextrose 5 % and 0.45 % NaCl 100 mL/hr at 09/10/22 1029   insulin 1.5 Units/hr (09/10/22 0913)   lactated ringers Stopped (09/09/22 1036)   vancomycin Stopped (09/10/22 0212)   PRN Meds:.acetaminophen, albuterol, dextrose, guaiFENesin, metoprolol tartrate, ondansetron (ZOFRAN) IV, mouth rinse, sodium chloride  Current Outpatient Medications  Medication Instructions   acetaminophen (TYLENOL) 650 mg, Oral, Every 6 hours PRN   ARIPiprazole (ABILIFY) 2 mg, Oral, Daily   empagliflozin (JARDIANCE) 25 mg, Oral, Daily   FLUoxetine (PROZAC) 20 mg, Oral, 2 times daily PRN   HYDROcodone-acetaminophen (NORCO/VICODIN) 5-325 MG tablet 1-2 tablets, Oral, Every 6 hours PRN  insulin aspart (NOVOLOG) 10-20 Units, Subcutaneous, 2 times daily   insulin glargine (LANTUS) 75 Units, Subcutaneous, 2 times daily   losartan (COZAAR) 50 mg, Oral, Daily   metFORMIN (GLUCOPHAGE) 1,000 mg, Oral, 2 times daily with meals   mupirocin ointment  (BACTROBAN) 2 % 1 application , Nasal, 2 times daily   polyethylene glycol (MIRALAX / GLYCOLAX) 17 g, Oral, Daily   rivaroxaban (XARELTO) 20 mg, Oral, Daily with supper   Semaglutide (2 MG/DOSE) 2 mg, Subcutaneous, Every 7 days   senna (SENOKOT) 17.2 mg, Oral, 2 times daily   silver sulfADIAZINE (SILVADENE) 1 % cream Topical, Daily   Skin Protectants, Misc. (INTERDRY AG TEXTILE 16"X096") SHEE 2 packets, Apply externally, Weekly   sulfamethoxazole-trimethoprim (BACTRIM DS,SEPTRA DS) 800-160 MG tablet 2 tablets, Oral, Every 12 hours    Diet Orders (From admission, onward)     Start     Ordered   09/09/22 1020  Diet NPO time specified  (Diabetes Ketoacidosis (DKA))  Diet effective now        09/09/22 1020            DVT prophylaxis:  rivaroxaban (XARELTO) tablet 20 mg   Lab Results  Component Value Date   PLT 184 09/10/2022      Code Status: Full Code  Family Communication: no family at bedside   Status is: Inpatient Remains inpatient appropriate because: severity of illness   Level of care: Stepdown  Consultants:  none  Objective: Vitals:   09/10/22 0739 09/10/22 0746 09/10/22 0800 09/10/22 0900  BP:   (!) 164/69 (!) 158/61  Pulse: (!) 113  (!) 111 (!) 108  Resp: (!) 27  (!) 30 (!) 29  Temp:      TempSrc:      SpO2: 92% 92% 97% 91%  Weight:      Height:        Intake/Output Summary (Last 24 hours) at 09/10/2022 1047 Last data filed at 09/10/2022 0913 Gross per 24 hour  Intake 2564.54 ml  Output 3100 ml  Net -535.46 ml    Wt Readings from Last 3 Encounters:  09/10/22 55.4 kg  04/25/16 135.6 kg  03/01/16 (!) 137 kg    Examination:  Constitutional: NAD Eyes: lids and conjunctivae normal, no scleral icterus ENMT: mmm Neck: normal, supple Respiratory: Bilateral rhonchi, no wheezing Cardiovascular: Regular rate and rhythm, no murmurs / rubs / gallops.  Abdomen: soft, no distention, no tenderness. Bowel sounds positive.  Skin: no rashes  Data  Reviewed: I have independently reviewed following labs and imaging studies   CBC Recent Labs  Lab 09/05/22 2335 09/06/22 0505 09/07/22 0419 09/08/22 0351 09/09/22 0054 09/10/22 0201  WBC 13.4* 11.7* 11.4* 18.0* 21.8* 17.8*  HGB 14.7 13.3 13.9 13.7 14.6 14.4  HCT 42.6 40.1 41.3 41.9 44.9 43.7  PLT 139* 135* 126* 148* 189 184  MCV 92.8 94.1 95.2 96.1 96.1 95.6  MCH 32.0 31.2 32.0 31.4 31.3 31.5  MCHC 34.5 33.2 33.7 32.7 32.5 33.0  RDW 13.0 13.0 13.2 13.4 13.8 14.3  LYMPHSABS 0.4* 1.1  --   --   --   --   MONOABS 0.9 1.1*  --   --   --   --   EOSABS 0.0 0.1  --   --   --   --   BASOSABS 0.0 0.0  --   --   --   --      Recent Labs  Lab 09/05/22 0059 09/05/22 2335 09/05/22 2335 09/05/22  2350 09/06/22 0145 09/06/22 0505 09/07/22 0419 09/08/22 0351 09/09/22 0054 09/09/22 0626 09/09/22 1035 09/09/22 1401 09/09/22 1826 09/09/22 2227 09/10/22 0201  NA  --  133*   < >  --   --  136 137 132* 132*  --  135 134* 134* 135 133*  K  --  3.0*   < >  --   --  3.5 3.9 4.2 4.3  --  4.0 3.9 3.9 3.9 4.8  CL  --  100   < >  --   --  103 104 103 105  --  105 107 107 108 110  CO2  --  20*   < >  --   --  23 21* 14* 12*  --  8* 12* 10* 11* 10*  GLUCOSE  --  262*   < >  --   --  114* 121* 93 148*  --  170* 135* 127* 135* 131*  BUN  --  13   < >  --   --  10 11 10 19   --  25* 25* 26* 26* 27*  CREATININE  --  0.80   < >  --   --  0.63 0.59* 0.67 0.86  --  1.00 0.93 0.80 0.80 0.72  CALCIUM  --  8.8*   < >  --   --  8.1* 8.5* 8.5* 9.7  --  9.3 9.3 9.4 9.5 9.1  AST  --  18  --   --   --   --  27 21  --   --   --   --   --   --   --   ALT  --  14  --   --   --   --  16 16  --   --   --   --   --   --   --   ALKPHOS  --  60  --   --   --   --  60 68  --   --   --   --   --   --   --   BILITOT  --  1.2  --   --   --   --  1.2 1.7*  --   --   --   --   --   --   --   ALBUMIN  --  3.6  --   --   --   --  3.2* 3.0*  --   --   --   --   --   --   --   MG  --   --   --   --   --  1.8  --  1.5* 1.9  --    --   --   --   --  2.0  LATICACIDVEN  --   --   --  1.9 1.5  --   --   --   --  1.2  --   --   --   --   --   INR  --  1.4*  --   --   --   --   --   --   --   --   --   --   --   --   --   HGBA1C  --   --   --   --   --  6.8*  --   --   --   --   --   --   --   --   --  BNP 61.7  --   --   --   --   --   --   --   --   --   --   --   --   --   --    < > = values in this interval not displayed.     ------------------------------------------------------------------------------------------------------------------ No results for input(s): "CHOL", "HDL", "LDLCALC", "TRIG", "CHOLHDL", "LDLDIRECT" in the last 72 hours.  Lab Results  Component Value Date   HGBA1C 6.8 (H) 09/06/2022   ------------------------------------------------------------------------------------------------------------------ No results for input(s): "TSH", "T4TOTAL", "T3FREE", "THYROIDAB" in the last 72 hours.  Invalid input(s): "FREET3"  Cardiac Enzymes No results for input(s): "CKMB", "TROPONINI", "MYOGLOBIN" in the last 168 hours.  Invalid input(s): "CK" ------------------------------------------------------------------------------------------------------------------    Component Value Date/Time   BNP 61.7 09/05/2022 0059    CBG: Recent Labs  Lab 09/10/22 0640 09/10/22 0733 09/10/22 0839 09/10/22 0938 09/10/22 1030  GLUCAP 118* 131* 130* 126* 124*     Recent Results (from the past 240 hour(s))  Culture, blood (Routine x 2)     Status: None (Preliminary result)   Collection Time: 09/05/22 11:15 PM   Specimen: BLOOD  Result Value Ref Range Status   Specimen Description   Final    BLOOD SITE NOT SPECIFIED Performed at [facility], [address], [place], [place] [address]    Special Requests   Final    BOTTLES DRAWN AEROBIC AND ANAEROBIC Blood Culture results may not be optimal due to an excessive volume of blood received in culture bottles Performed at [facility], [address], [place], [place] [address]    Culture   Final    NO GROWTH 4 DAYS Performed at [facility], [address] [place], [place] [address]    Report Status PENDING  Incomplete  Resp panel by RT-PCR (RSV, Flu A&B, Covid) Anterior Nasal Swab     Status: None   Collection Time: 09/05/22 11:17 PM   Specimen: Anterior Nasal Swab  Result Value Ref Range Status   SARS Coronavirus 2 by RT PCR NEGATIVE NEGATIVE Final    Comment: (NOTE) SARS-CoV-2 target nucleic acids are NOT DETECTED.  The SARS-CoV-2 RNA is generally detectable in upper respiratory specimens during the acute phase of infection. The lowest concentration of SARS-CoV-2 viral copies this assay can detect is 138 copies/mL. A negative result does not preclude SARS-Cov-2 infection and should not be used as the sole basis for treatment or other patient management decisions. A negative result may occur with  improper specimen collection/handling, submission of specimen other than nasopharyngeal swab, presence of viral mutation(s) within the areas targeted by this assay, and inadequate number of viral copies(<138 copies/mL). A negative result must be combined with clinical observations, patient history, and epidemiological information. The expected result is Negative.  Fact Sheet for Patients:  BloggerCourse.com  Fact Sheet for Healthcare Providers:  SeriousBroker.it  This test is no t yet approved or cleared by the [place] FDA and  has been authorized for detection and/or diagnosis of SARS-CoV-2 by FDA under an Emergency Use Authorization (EUA). This EUA will remain  in effect (meaning this test can be used) for the duration of the COVID-19 declaration under Section 564(b)(1) of the Act, 21 U.S.C.section 360bbb-3(b)(1), unless the authorization is terminated  or revoked sooner.       Influenza A by PCR NEGATIVE NEGATIVE Final   Influenza B  by PCR NEGATIVE NEGATIVE Final    Comment: (  NOTE) The Xpert Xpress SARS-CoV-2/FLU/RSV plus assay is intended as an aid in the diagnosis of influenza from Nasopharyngeal swab specimens and should not be used as a sole basis for treatment. Nasal washings and aspirates are unacceptable for Xpert Xpress SARS-CoV-2/FLU/RSV testing.  Fact Sheet for Patients: BloggerCourse.com  Fact Sheet for Healthcare Providers: SeriousBroker.it  This test is not yet approved or cleared by the [place] FDA and has been authorized for detection and/or diagnosis of SARS-CoV-2 by FDA under an Emergency Use Authorization (EUA). This EUA will remain in effect (meaning this test can be used) for the duration of the COVID-19 declaration under Section 564(b)(1) of the Act, 21 U.S.C. section 360bbb-3(b)(1), unless the authorization is terminated or revoked.     Resp Syncytial Virus by PCR NEGATIVE NEGATIVE Final    Comment: (NOTE) Fact Sheet for Patients: BloggerCourse.com  Fact Sheet for Healthcare Providers: SeriousBroker.it  This test is not yet approved or cleared by the [place] FDA and has been authorized for detection and/or diagnosis of SARS-CoV-2 by FDA under an Emergency Use Authorization (EUA). This EUA will remain in effect (meaning this test can be used) for the duration of the COVID-19 declaration under Section 564(b)(1) of the Act, 21 U.S.C. section 360bbb-3(b)(1), unless the authorization is terminated or revoked.  Performed at [facility], [address] [place], [place] [address]   Culture, blood (Routine x 2)     Status: None (Preliminary result)   Collection Time: 09/06/22 12:25 AM   Specimen: BLOOD  Result Value Ref Range Status   Specimen Description   Final    BLOOD SITE NOT SPECIFIED Performed at [facility], [address], [place], [place] [address]    Special Requests   Final    BOTTLES DRAWN AEROBIC AND ANAEROBIC Blood Culture adequate volume Performed at [facility], [address], [place], [place] [address]    Culture   Final    NO GROWTH 4 DAYS Performed at [facility], [address], [place], [place] [address]    Report Status PENDING  Incomplete  MRSA Next Gen by PCR, Nasal     Status: Abnormal   Collection Time: 09/09/22  7:54 AM   Specimen: Nasal Mucosa; Nasal Swab  Result Value Ref Range Status   MRSA by PCR Next Gen DETECTED (A) NOT DETECTED Final    Comment: (NOTE) The GeneXpert MRSA Assay (FDA approved for NASAL specimens only), is one component of a comprehensive MRSA colonization surveillance program. It is not intended to diagnose MRSA infection nor to guide or monitor treatment for MRSA infections. Test performance is not FDA approved in patients less than 48 years old. Performed at [facility], [address], [place], [place] [address]      Radiology Studies: No results found.   Pamella Pert, MD, PhD [facility]  Between 7 am - 7 pm I am available, please contact me via Amion (for emergencies) or Securechat (non urgent messages)  Between 7 pm - 7 am I am not available, please contact night coverage MD/APP via Amion

## 2022-09-10 NOTE — Progress Notes (Signed)
   09/10/22 2254  BiPAP/CPAP/SIPAP  BiPAP/CPAP/SIPAP Pt Type Adult  BiPAP/CPAP/SIPAP V60  Mask Type Full face mask  Mask Size Medium  Set Rate 0 breaths/min  Respiratory Rate 24 breaths/min  EPAP 8 cmH2O  FiO2 (%) 40 %  Minute Ventilation 11  Leak 11  Peak Inspiratory Pressure (PIP) 8  Tidal Volume (Vt) 505  Patient Home Equipment No  Auto Titrate No  Press High Alarm 30 cmH2O  Press Low Alarm 5 cmH2O  CPAP/SIPAP surface wiped down Yes  BiPAP/CPAP /SiPAP Vitals  Pulse Rate (!) 106  Resp (!) 25  SpO2 95 %  Bilateral Breath Sounds Clear;Diminished  MEWS Score/Color  MEWS Score 3  MEWS Score Color Yellow   Pt placed on cpap (V60) tolerating it well at this time.

## 2022-09-11 DIAGNOSIS — J189 Pneumonia, unspecified organism: Secondary | ICD-10-CM | POA: Diagnosis not present

## 2022-09-11 LAB — CULTURE, BLOOD (ROUTINE X 2)
Culture: NO GROWTH
Culture: NO GROWTH
Special Requests: ADEQUATE

## 2022-09-11 LAB — CBC
HCT: 41.3 % (ref 39.0–52.0)
Hemoglobin: 13.7 g/dL (ref 13.0–17.0)
MCH: 31.5 pg (ref 26.0–34.0)
MCHC: 33.2 g/dL (ref 30.0–36.0)
MCV: 94.9 fL (ref 80.0–100.0)
Platelets: 196 10*3/uL (ref 150–400)
RBC: 4.35 MIL/uL (ref 4.22–5.81)
RDW: 14.6 % (ref 11.5–15.5)
WBC: 12.8 10*3/uL — ABNORMAL HIGH (ref 4.0–10.5)
nRBC: 0 % (ref 0.0–0.2)

## 2022-09-11 LAB — BASIC METABOLIC PANEL
Anion gap: 12 (ref 5–15)
Anion gap: 15 (ref 5–15)
BUN: 22 mg/dL — ABNORMAL HIGH (ref 6–20)
BUN: 18 mg/dL (ref 6–20)
CO2: 20 mmol/L — ABNORMAL LOW (ref 22–32)
CO2: 20 mmol/L — ABNORMAL LOW (ref 22–32)
Calcium: 9.5 mg/dL (ref 8.9–10.3)
Calcium: 9.6 mg/dL (ref 8.9–10.3)
Chloride: 113 mmol/L — ABNORMAL HIGH (ref 98–111)
Chloride: 108 mmol/L (ref 98–111)
Creatinine, Ser: 0.62 mg/dL (ref 0.61–1.24)
Creatinine, Ser: 0.62 mg/dL (ref 0.61–1.24)
GFR, Estimated: >60 mL/min (ref >60)
GFR, Estimated: >60 mL/min (ref >60)
Glucose, Bld: 149 mg/dL — ABNORMAL HIGH (ref 70–99)
Glucose, Bld: 160 mg/dL — ABNORMAL HIGH (ref 70–99)
Potassium: 3.2 mmol/L — ABNORMAL LOW (ref 3.5–5.1)
Potassium: 3 mmol/L — ABNORMAL LOW (ref 3.5–5.1)
Sodium: 145 mmol/L (ref 135–145)
Sodium: 143 mmol/L (ref 135–145)

## 2022-09-11 LAB — GLUCOSE, CAPILLARY
Glucose-Capillary: 159 mg/dL — ABNORMAL HIGH (ref 70–99)
Glucose-Capillary: 133 mg/dL — ABNORMAL HIGH (ref 70–99)
Glucose-Capillary: 147 mg/dL — ABNORMAL HIGH (ref 70–99)
Glucose-Capillary: 159 mg/dL — ABNORMAL HIGH (ref 70–99)
Glucose-Capillary: 152 mg/dL — ABNORMAL HIGH (ref 70–99)
Glucose-Capillary: 148 mg/dL — ABNORMAL HIGH (ref 70–99)
Glucose-Capillary: 148 mg/dL — ABNORMAL HIGH (ref 70–99)
Glucose-Capillary: 129 mg/dL — ABNORMAL HIGH (ref 70–99)
Glucose-Capillary: 140 mg/dL — ABNORMAL HIGH (ref 70–99)
Glucose-Capillary: 142 mg/dL — ABNORMAL HIGH (ref 70–99)
Glucose-Capillary: 143 mg/dL — ABNORMAL HIGH (ref 70–99)
Glucose-Capillary: 161 mg/dL — ABNORMAL HIGH (ref 70–99)
Glucose-Capillary: 134 mg/dL — ABNORMAL HIGH (ref 70–99)

## 2022-09-11 LAB — COMPREHENSIVE METABOLIC PANEL
ALT: 26 U/L (ref 0–44)
AST: 38 U/L (ref 15–41)
Albumin: 2.6 g/dL — ABNORMAL LOW (ref 3.5–5.0)
Alkaline Phosphatase: 64 U/L (ref 38–126)
Anion gap: 11 (ref 5–15)
BUN: 22 mg/dL — ABNORMAL HIGH (ref 6–20)
CO2: 20 mmol/L — ABNORMAL LOW (ref 22–32)
Calcium: 9.6 mg/dL (ref 8.9–10.3)
Chloride: 112 mmol/L — ABNORMAL HIGH (ref 98–111)
Creatinine, Ser: 0.64 mg/dL (ref 0.61–1.24)
GFR, Estimated: >60 mL/min (ref >60)
Glucose, Bld: 147 mg/dL — ABNORMAL HIGH (ref 70–99)
Potassium: 3.6 mmol/L (ref 3.5–5.1)
Sodium: 143 mmol/L (ref 135–145)
Total Bilirubin: 1.1 mg/dL (ref 0.3–1.2)
Total Protein: 6.8 g/dL (ref 6.5–8.1)

## 2022-09-11 LAB — BETA-HYDROXYBUTYRIC ACID
Beta-Hydroxybutyric Acid: 3.41 mmol/L — ABNORMAL HIGH (ref 0.05–0.27)
Beta-Hydroxybutyric Acid: 3.12 mmol/L — ABNORMAL HIGH (ref 0.05–0.27)
Beta-Hydroxybutyric Acid: 3.85 mmol/L — ABNORMAL HIGH (ref 0.05–0.27)

## 2022-09-11 LAB — MAGNESIUM: Magnesium: 1.9 mg/dL (ref 1.7–2.4)

## 2022-09-11 MED ORDER — INSULIN ASPART 100 UNIT/ML IJ SOLN
0.0000 [IU] | Freq: Three times a day (TID) | INTRAMUSCULAR | Status: DC
  Administered 2022-09-11 – 2022-09-12 (×2): 2 [IU] via SUBCUTANEOUS
  Administered 2022-09-12: 1 [IU] via SUBCUTANEOUS
  Administered 2022-09-12 – 2022-09-13 (×4): 2 [IU] via SUBCUTANEOUS
  Administered 2022-09-13 – 2022-09-14 (×2): 5 [IU] via SUBCUTANEOUS
  Administered 2022-09-14: 2 [IU] via SUBCUTANEOUS
  Administered 2022-09-15: 1 [IU] via SUBCUTANEOUS
  Administered 2022-09-15: 5 [IU] via SUBCUTANEOUS
  Administered 2022-09-15 – 2022-09-16 (×3): 2 [IU] via SUBCUTANEOUS
  Administered 2022-09-16: 3 [IU] via SUBCUTANEOUS
  Administered 2022-09-17: 1 [IU] via SUBCUTANEOUS
  Administered 2022-09-17: 5 [IU] via SUBCUTANEOUS
  Administered 2022-09-17 – 2022-09-18 (×4): 2 [IU] via SUBCUTANEOUS
  Administered 2022-09-19: 3 [IU] via SUBCUTANEOUS
  Administered 2022-09-19: 2 [IU] via SUBCUTANEOUS

## 2022-09-11 MED ORDER — FUROSEMIDE 10 MG/ML IJ SOLN
40.0000 mg | Freq: Once | INTRAMUSCULAR | Status: AC
  Administered 2022-09-11: 40 mg via INTRAVENOUS
  Filled 2022-09-11: qty 4

## 2022-09-11 MED ORDER — INSULIN GLARGINE-YFGN 100 UNIT/ML ~~LOC~~ SOLN
10.0000 [IU] | Freq: Every day | SUBCUTANEOUS | Status: DC
  Administered 2022-09-11 – 2022-09-17 (×7): 10 [IU] via SUBCUTANEOUS
  Filled 2022-09-11 (×8): qty 0.1

## 2022-09-11 MED ORDER — POTASSIUM CHLORIDE CRYS ER 20 MEQ PO TBCR
40.0000 meq | EXTENDED_RELEASE_TABLET | Freq: Once | ORAL | Status: AC
  Administered 2022-09-11: 40 meq via ORAL
  Filled 2022-09-11: qty 2

## 2022-09-11 MED ORDER — INSULIN ASPART 100 UNIT/ML IJ SOLN: 0.0000 [IU] | Freq: Every day | INTRAMUSCULAR | Status: DC

## 2022-09-11 NOTE — Progress Notes (Signed)
PROGRESS NOTE  Bradley Mcclure NFA:[id number] DOB: Dec 29, 1960 DOA: 09/05/2022 PCP: [facility]   LOS: 5 days   Brief Narrative / Interim history: 62 year old male with prior history of MRSA bacteremia, anxiety, depression, mild cognitive disorder, HTN, HLD, DM2, NAFLD, prior PE on chronic anticoagulation, obesity comes to the hospital with several days of URI type symptoms, cough, chest congestion and eventually passing out.  In the ED he was found to have right lower lobe pneumonia, he was started on antibiotics and admitted to the hospital  Subjective / 24h Interval events: No overnight events, afebrile, alert, slow to respond but that is his baseline.  Denies any shortness of breath.  He tells me he is hungry  Assesement and Plan: Principal Problem:   Multifocal pneumonia Active Problems:   Hyperlipidemia   Major depressive disorder   Pulmonary embolism (HCC)   Essential hypertension   Type 2 diabetes mellitus (HCC)   GERD (gastroesophageal reflux disease)   Principal problem Sepsis due to multifocal pneumonia -met sepsis criteria with leukocytosis, fever, tachycardia, tachypnea, and a source as evidenced on imaging, patient was started on ceftriaxone and azithromycin.  On 5/20 4 AM, due to increasing leukocytosis and increasing work of breathing patient was transferred to stepdown and antibiotics were broadened to vancomycin and cefepime.  Continue for now -Continue BiPAP intermittently, he required his overnight but is comfortable on nasal cannula this morning. -CT scan 5/25 with worsening airspace consolidation, and also development of a small right and left pleural effusions.  Suspect he is becoming fluid overloaded in the setting of insulin and dextrose infusions.  Active problems Euglycemic DKA -ABG on 5/24 showed significant metabolic acidosis partially compensated by respiratory alkalosis.  Initially there was no apparent cause for his metabolic acidosis with  normal lactic acid, borderline anion gap, however beta hydroxybutyrate was found to be elevated suggesting euglycemic DKA.  Patient is on Jardiance at home, but he has been off of it while here.  I wonder whether this was triggered more so by #1 -Continue insulin drip, bicarbonate and beta hydroxybutyrate improving, will follow-up on the 10 AM labs and see if he can come off insulin drip  History of PE-continue Xarelto  Essential hypertension-continue losartan.  2D echo done 5/22 shows normal LVEF 60 to 65%, no WMA, LVH, RV was normal.  Obesity, borderline-BMI 30.25  DM2-continue home regimen  Lab Results  Component Value Date   HGBA1C 6.8 (H) 09/06/2022   CBG (last 3)  Recent Labs    09/10/22 2352 09/11/22 0151 09/11/22 0404  GLUCAP 141* 159* 133*    Thrombocytopenia-possibly in the setting of acute illness, platelets have since normalized  History of depression-continue Abilify  Scheduled Meds:  amLODipine  10 mg Oral Daily   ARIPiprazole  2 mg Oral Daily   Chlorhexidine Gluconate Cloth  6 each Topical Daily   ipratropium-albuterol  3 mL Nebulization BID   losartan  50 mg Oral Daily   metoprolol tartrate  25 mg Oral BID   rivaroxaban  20 mg Oral Q supper   sodium chloride flush  3 mL Intravenous Q12H   Continuous Infusions:  ceFEPime (MAXIPIME) IV Stopped (09/11/22 0155)   dextrose 5 % and 0.45 % NaCl 100 mL/hr at 09/11/22 0837   insulin 1.3 Units/hr (09/11/22 0837)   lactated ringers Stopped (09/09/22 1036)   vancomycin Stopped (09/11/22 0124)   PRN Meds:.acetaminophen, albuterol, dextrose, guaiFENesin, metoprolol tartrate, ondansetron (ZOFRAN) IV, mouth rinse, sodium chloride  Current Outpatient Medications  Medication  Instructions   acetaminophen (TYLENOL) 650 mg, Oral, Every 6 hours PRN   ARIPiprazole (ABILIFY) 2 mg, Oral, Daily   empagliflozin (JARDIANCE) 25 mg, Oral, Daily   FLUoxetine (PROZAC) 20 mg, Oral, 2 times daily PRN   HYDROcodone-acetaminophen  (NORCO/VICODIN) 5-325 MG tablet 1-2 tablets, Oral, Every 6 hours PRN   insulin aspart (NOVOLOG) 10-20 Units, Subcutaneous, 2 times daily   insulin glargine (LANTUS) 75 Units, Subcutaneous, 2 times daily   losartan (COZAAR) 50 mg, Oral, Daily   metFORMIN (GLUCOPHAGE) 1,000 mg, Oral, 2 times daily with meals   mupirocin ointment (BACTROBAN) 2 % 1 application , Nasal, 2 times daily   polyethylene glycol (MIRALAX / GLYCOLAX) 17 g, Oral, Daily   rivaroxaban (XARELTO) 20 mg, Oral, Daily with supper   Semaglutide (2 MG/DOSE) 2 mg, Subcutaneous, Every 7 days   senna (SENOKOT) 17.2 mg, Oral, 2 times daily   silver sulfADIAZINE (SILVADENE) 1 % cream Topical, Daily   Skin Protectants, Misc. (INTERDRY AG TEXTILE 16"X096") SHEE 2 packets, Apply externally, Weekly   sulfamethoxazole-trimethoprim (BACTRIM DS,SEPTRA DS) 800-160 MG tablet 2 tablets, Oral, Every 12 hours    Diet Orders (From admission, onward)     Start     Ordered   09/10/22 1542  Diet NPO time specified Except for: Sips with Meds  (Diabetes Ketoacidosis (DKA))  Diet effective now       Question:  Except for  Answer:  Sips with Meds   09/10/22 1541            DVT prophylaxis:  rivaroxaban (XARELTO) tablet 20 mg   Lab Results  Component Value Date   PLT 196 09/11/2022      Code Status: Full Code  Family Communication: no family at bedside   Status is: Inpatient Remains inpatient appropriate because: severity of illness   Level of care: Stepdown  Consultants:  none  Objective: Vitals:   09/11/22 0600 09/11/22 0732 09/11/22 0746 09/11/22 0800  BP: (!) 172/72   (!) 157/59  Pulse:    98  Resp: (!) 22   (!) 23  Temp:   98.5 F (36.9 C)   TempSrc:   Axillary   SpO2:  94%  93%  Weight: 65.1 kg     Height:        Intake/Output Summary (Last 24 hours) at 09/11/2022 0927 Last data filed at 09/11/2022 0454 Gross per 24 hour  Intake 2174.53 ml  Output 3475 ml  Net -1300.47 ml    Wt Readings from Last 3  Encounters:  09/11/22 65.1 kg  04/25/16 135.6 kg  03/01/16 (!) 137 kg    Examination:  Constitutional: NAD Eyes: lids and conjunctivae normal, no scleral icterus ENMT: mmm Neck: normal, supple Respiratory: clear to auscultation bilaterally, no wheezing, no crackles. Normal respiratory effort.  Cardiovascular: Regular rate and rhythm, no murmurs / rubs / gallops. No LE edema. Abdomen: soft, no distention, no tenderness. Bowel sounds positive.   Data Reviewed: I have independently reviewed following labs and imaging studies   CBC Recent Labs  Lab 09/05/22 2335 09/06/22 0505 09/07/22 0419 09/08/22 0351 09/09/22 0054 09/10/22 0201 09/11/22 0300  WBC 13.4* 11.7* 11.4* 18.0* 21.8* 17.8* 12.8*  HGB 14.7 13.3 13.9 13.7 14.6 14.4 13.7  HCT 42.6 40.1 41.3 41.9 44.9 43.7 41.3  PLT 139* 135* 126* 148* 189 184 196  MCV 92.8 94.1 95.2 96.1 96.1 95.6 94.9  MCH 32.0 31.2 32.0 31.4 31.3 31.5 31.5  MCHC 34.5 33.2 33.7 32.7 32.5 33.0 33.2  RDW 13.0 13.0 13.2 13.4 13.8 14.3 14.6  LYMPHSABS 0.4* 1.1  --   --   --   --   --   MONOABS 0.9 1.1*  --   --   --   --   --   EOSABS 0.0 0.1  --   --   --   --   --   BASOSABS 0.0 0.0  --   --   --   --   --      Recent Labs  Lab 09/05/22 0059 09/05/22 2335 09/05/22 2335 09/05/22 2350 09/06/22 0145 09/06/22 0505 09/07/22 0419 09/08/22 0351 09/09/22 0054 09/09/22 0626 09/09/22 1035 09/09/22 2227 09/10/22 0201 09/10/22 1104 09/10/22 1856 09/11/22 0300  NA  --  133*   < >  --   --  136 137 132* 132*  --    < > 135 133* 140 141 143  K  --  3.0*   < >  --   --  3.5 3.9 4.2 4.3  --    < > 3.9 4.8 3.6 3.4* 3.6  CL  --  100   < >  --   --  103 104 103 105  --    < > 108 110 112* 113* 112*  CO2  --  20*   < >  --   --  23 21* 14* 12*  --    < > 11* 10* 13* 15* 20*  GLUCOSE  --  262*   < >  --   --  114* 121* 93 148*  --    < > 135* 131* 145* 144* 147*  BUN  --  13   < >  --   --  10 11 10 19   --    < > 26* 27* 25* 24* 22*  CREATININE  --   0.80   < >  --   --  0.63 0.59* 0.67 0.86  --    < > 0.80 0.72 0.66 0.63 0.64  CALCIUM  --  8.8*   < >  --   --  8.1* 8.5* 8.5* 9.7  --    < > 9.5 9.1 9.5 9.6 9.6  AST  --  18  --   --   --   --  27 21  --   --   --   --   --   --   --  38  ALT  --  14  --   --   --   --  16 16  --   --   --   --   --   --   --  26  ALKPHOS  --  60  --   --   --   --  60 68  --   --   --   --   --   --   --  64  BILITOT  --  1.2  --   --   --   --  1.2 1.7*  --   --   --   --   --   --   --  1.1  ALBUMIN  --  3.6  --   --   --   --  3.2* 3.0*  --   --   --   --   --   --   --  2.6*  MG  --   --   --   --   --  1.8  --  1.5* 1.9  --   --   --  2.0  --   --  1.9  LATICACIDVEN  --   --   --  1.9 1.5  --   --   --   --  1.2  --   --   --   --   --   --   INR  --  1.4*  --   --   --   --   --   --   --   --   --   --   --   --   --   --   HGBA1C  --   --   --   --   --  6.8*  --   --   --   --   --   --   --   --   --   --   BNP 61.7  --   --   --   --   --   --   --   --   --   --   --   --   --   --   --    < > = values in this interval not displayed.     ------------------------------------------------------------------------------------------------------------------ No results for input(s): "CHOL", "HDL", "LDLCALC", "TRIG", "CHOLHDL", "LDLDIRECT" in the last 72 hours.  Lab Results  Component Value Date   HGBA1C 6.8 (H) 09/06/2022   ------------------------------------------------------------------------------------------------------------------ No results for input(s): "TSH", "T4TOTAL", "T3FREE", "THYROIDAB" in the last 72 hours.  Invalid input(s): "FREET3"  Cardiac Enzymes No results for input(s): "CKMB", "TROPONINI", "MYOGLOBIN" in the last 168 hours.  Invalid input(s): "CK" ------------------------------------------------------------------------------------------------------------------    Component Value Date/Time   BNP 61.7 09/05/2022 0059    CBG: Recent Labs  Lab 09/10/22 2136  09/10/22 2241 09/10/22 2352 09/11/22 0151 09/11/22 0404  GLUCAP 145* 141* 141* 159* 133*     Recent Results (from the past 240 hour(s))  Culture, blood (Routine x 2)     Status: None   Collection Time: 09/05/22 11:15 PM   Specimen: BLOOD  Result Value Ref Range Status   Specimen Description   Final    BLOOD SITE NOT SPECIFIED Performed at [facility], [address] [place], [place] [address]    Special Requests   Final    BOTTLES DRAWN AEROBIC AND ANAEROBIC Blood Culture results may not be optimal due to an excessive volume of blood received in culture bottles Performed at [facility], [address], [place], [place] [address]    Culture   Final    NO GROWTH 5 DAYS Performed at [facility], [address] [place], [place] [address]    Report Status 09/11/2022 FINAL  Final  Resp panel by RT-PCR (RSV, Flu A&B, Covid) Anterior Nasal Swab     Status: None   Collection Time: 09/05/22 11:17 PM   Specimen: Anterior Nasal Swab  Result Value Ref Range Status   SARS Coronavirus 2 by RT PCR NEGATIVE NEGATIVE Final    Comment: (NOTE) SARS-CoV-2 target nucleic acids are NOT DETECTED.  The SARS-CoV-2 RNA is generally detectable in upper respiratory specimens during the acute phase of infection. The lowest concentration of SARS-CoV-2 viral copies this assay can detect is 138 copies/mL. A negative result does not preclude SARS-Cov-2 infection and should not be used as the sole basis for treatment or other patient management decisions. A negative  result may occur with  improper specimen collection/handling, submission of specimen other than nasopharyngeal swab, presence of viral mutation(s) within the areas targeted by this assay, and inadequate number of viral copies(<138 copies/mL). A negative result must be combined with clinical observations, patient history, and epidemiological information. The expected result is Negative.  Fact  Sheet for Patients:  BloggerCourse.com  Fact Sheet for Healthcare Providers:  SeriousBroker.it  This test is no t yet approved or cleared by the [place] FDA and  has been authorized for detection and/or diagnosis of SARS-CoV-2 by FDA under an Emergency Use Authorization (EUA). This EUA will remain  in effect (meaning this test can be used) for the duration of the COVID-19 declaration under Section 564(b)(1) of the Act, 21 U.S.C.section 360bbb-3(b)(1), unless the authorization is terminated  or revoked sooner.       Influenza A by PCR NEGATIVE NEGATIVE Final   Influenza B by PCR NEGATIVE NEGATIVE Final    Comment: (NOTE) The Xpert Xpress SARS-CoV-2/FLU/RSV plus assay is intended as an aid in the diagnosis of influenza from Nasopharyngeal swab specimens and should not be used as a sole basis for treatment. Nasal washings and aspirates are unacceptable for Xpert Xpress SARS-CoV-2/FLU/RSV testing.  Fact Sheet for Patients: BloggerCourse.com  Fact Sheet for Healthcare Providers: SeriousBroker.it  This test is not yet approved or cleared by the [place] FDA and has been authorized for detection and/or diagnosis of SARS-CoV-2 by FDA under an Emergency Use Authorization (EUA). This EUA will remain in effect (meaning this test can be used) for the duration of the COVID-19 declaration under Section 564(b)(1) of the Act, 21 U.S.C. section 360bbb-3(b)(1), unless the authorization is terminated or revoked.     Resp Syncytial Virus by PCR NEGATIVE NEGATIVE Final    Comment: (NOTE) Fact Sheet for Patients: BloggerCourse.com  Fact Sheet for Healthcare Providers: SeriousBroker.it  This test is not yet approved or cleared by the [place] FDA and has been authorized for detection and/or diagnosis of SARS-CoV-2 by FDA under an  Emergency Use Authorization (EUA). This EUA will remain in effect (meaning this test can be used) for the duration of the COVID-19 declaration under Section 564(b)(1) of the Act, 21 U.S.C. section 360bbb-3(b)(1), unless the authorization is terminated or revoked.  Performed at [facility], [address] [place], [place] [address]   Culture, blood (Routine x 2)     Status: None   Collection Time: 09/06/22 12:25 AM   Specimen: BLOOD  Result Value Ref Range Status   Specimen Description   Final    BLOOD SITE NOT SPECIFIED Performed at [facility], [address], [place], [place] [address]    Special Requests   Final    BOTTLES DRAWN AEROBIC AND ANAEROBIC Blood Culture adequate volume Performed at [facility], [address] [place], [place] [address]    Culture   Final    NO GROWTH 5 DAYS Performed at [facility], [address], [place], [place] [address]    Report Status 09/11/2022 FINAL  Final  MRSA Next Gen by PCR, Nasal     Status: Abnormal   Collection Time: 09/09/22  7:54 AM   Specimen: Nasal Mucosa; Nasal Swab  Result Value Ref Range Status   MRSA by PCR Next Gen DETECTED (A) NOT DETECTED Final    Comment: (NOTE) The GeneXpert MRSA Assay (FDA approved for NASAL specimens only), is one component of a comprehensive MRSA colonization surveillance program. It  is not intended to diagnose MRSA infection nor to guide or monitor treatment for MRSA infections. Test performance is not FDA approved in patients less than 78 years old. Performed at [facility], [address], [place], [place] [address]      Radiology Studies: CT CHEST WO CONTRAST  Result Date: 09/10/2022 CLINICAL DATA:  Pneumonia, complications suspected. EXAM: CT CHEST WITHOUT CONTRAST TECHNIQUE: Multidetector CT imaging of the chest was performed following the standard protocol without IV contrast. RADIATION DOSE  REDUCTION: This exam was performed according to the departmental dose-optimization program which includes automated exposure control, adjustment of the mA and/or kV according to patient size and/or use of iterative reconstruction technique. COMPARISON:  Sep 06, 2022 FINDINGS: Cardiovascular: Calcific atherosclerotic disease of the coronary arteries. Normal heart size. No pericardial effusion. Minimal calcific atherosclerotic disease of the aorta. Mediastinum/Nodes: Mildly enlarged mediastinal lymph nodes the largest measuring 1 cm in short axis, likely reactive. Lungs/Pleura: Worsening of bilateral airspace consolidation with confluent consolidation in the right upper lobe and areas of patchy consolidation throughout all of the areas of the lungs. Interval development of small right pleural effusion and tiny left pleural effusion. Upper Abdomen: No acute abnormality. Musculoskeletal: No chest wall mass or suspicious bone lesions identified. IMPRESSION: 1. Worsening of bilateral airspace consolidation with confluent consolidation in the right upper lobe and areas of patchy consolidation throughout all of the bilateral lung parenchyma. 2. Interval development of small right pleural effusion and tiny left pleural effusion. 3. Mildly enlarged mediastinal lymph nodes, likely reactive. 4. Calcific atherosclerotic disease of the coronary arteries. 5. Aortic atherosclerosis. Aortic Atherosclerosis (ICD10-I70.0). Electronically Signed   By: Ted Mcalpine M.D.   On: 09/10/2022 20:23     Pamella Pert, MD, PhD [facility]  Between 7 am - 7 pm I am available, please contact me via Amion (for emergencies) or Securechat (non urgent messages)  Between 7 pm - 7 am I am not available, please contact night coverage MD/APP via Amion

## 2022-09-11 NOTE — Inpatient Diabetes Management (Signed)
Inpatient Diabetes Program Recommendations  AACE/ADA: New Consensus Statement on Inpatient Glycemic Control (2015)  Target Ranges:  Prepandial:   less than 140 mg/dL      Peak postprandial:   less than 180 mg/dL (1-2 hours)      Critically ill patients:  140 - 180 mg/dL    Latest Reference Range & Units 09/11/22 03:00  Sodium 135 - 145 mmol/L 143  Potassium 3.5 - 5.1 mmol/L 3.6  Chloride 98 - 111 mmol/L 112 (H)  CO2 22 - 32 mmol/L 20 (L)  Glucose 70 - 99 mg/dL 161 (H)  BUN 6 - 20 mg/dL 22 (H)  Creatinine 0.96 - 1.24 mg/dL 0.45  Calcium 8.9 - 40.9 mg/dL 9.6  Anion gap 5 - 15  11  (H): Data is abnormally high (L): Data is abnormally low   History: DM2   Home DM Meds: Jardiance 25 mg daily                              Novolog 10-20 units BID                              Metformin 1000 mg BID                              Ozempic 2 mg qweek                              Lantus 75 units BID (NOT taking the Lantus per Home Med Rec)   Current Orders: IV Insulin Drip     MD- Note 3am BMET shows improvement (AG 11/ CO2 20)  Note plans to wait for 10am BMET before making decision to transition to SQ Insulin  When transition occurs, please consider using weight based approach to basal insulin (it appears pt has not been taking the Lantus as listed in the Home Med Rec) Looks like pt has been getting around 1-1.3 units/hr on average on the IV Insulin Drip since [place] Recommend Semglee 13 units Daily (0.2 units/hr) for transition    --Will follow patient during hospitalization--  Ambrose Finland RN, MSN, CDCES Diabetes Coordinator Inpatient Glycemic Control Team Team Pager: [phone] (8a-5p)

## 2022-09-11 NOTE — Progress Notes (Signed)
   09/11/22 2343  BiPAP/CPAP/SIPAP  BiPAP/CPAP/SIPAP Pt Type (S)  Adult (standby pt not compliant today. he said he is fine without it. No resp distress noted. Machine remained bedside.)  BiPAP/CPAP/SIPAP V60  Mask Type Full face mask  Mask Size Medium  Set Rate 0 breaths/min  EPAP 8 cmH2O  FiO2 (%) 40 %

## 2022-09-11 NOTE — Progress Notes (Signed)
   09/11/22 0316  BiPAP/CPAP/SIPAP  BiPAP/CPAP/SIPAP Pt Type Adult  BiPAP/CPAP/SIPAP V60  Mask Type Full face mask  Mask Size Medium  Set Rate 0 breaths/min  Respiratory Rate 22 breaths/min  EPAP 8 cmH2O  FiO2 (%) 40 %  Minute Ventilation 10  Leak 14  Peak Inspiratory Pressure (PIP) 8  Tidal Volume (Vt) 445  Patient Home Equipment No  Auto Titrate No  Press High Alarm 30 cmH2O  Press Low Alarm 5 cmH2O  BiPAP/CPAP /SiPAP Vitals  Pulse Rate 94  Resp (!) 22  BP 132/71  SpO2 98 %  Bilateral Breath Sounds Diminished  MEWS Score/Color  MEWS Score 2  MEWS Score Color Yellow

## 2022-09-11 NOTE — Plan of Care (Signed)
Education and review of plan of care discussed with patient, time given for questions and answers, medications and other treatments reviewed also, patient shows understanding of education and information reviewed.  Problem: Education: Goal: Ability to describe self-care measures that may prevent or decrease complications (Diabetes Survival Skills Education) will improve Outcome: Progressing Goal: Individualized Educational Video(s) Outcome: Progressing   Problem: Coping: Goal: Ability to adjust to condition or change in health will improve Outcome: Progressing   Problem: Fluid Volume: Goal: Ability to maintain a balanced intake and output will improve Outcome: Progressing   Problem: Health Behavior/Discharge Planning: Goal: Ability to identify and utilize available resources and services will improve Outcome: Progressing Goal: Ability to manage health-related needs will improve Outcome: Progressing   Problem: Metabolic: Goal: Ability to maintain appropriate glucose levels will improve Outcome: Progressing   Problem: Nutritional: Goal: Maintenance of adequate nutrition will improve Outcome: Progressing Goal: Progress toward achieving an optimal weight will improve Outcome: Progressing   Problem: Skin Integrity: Goal: Risk for impaired skin integrity will decrease Outcome: Progressing   Problem: Tissue Perfusion: Goal: Adequacy of tissue perfusion will improve Outcome: Progressing   Problem: Activity: Goal: Ability to tolerate increased activity will improve Outcome: Progressing   Problem: Clinical Measurements: Goal: Ability to maintain a body temperature in the normal range will improve Outcome: Progressing   Problem: Respiratory: Goal: Ability to maintain adequate ventilation will improve Outcome: Progressing Goal: Ability to maintain a clear airway will improve Outcome: Progressing   Problem: Education: Goal: Knowledge of General Education information will  improve Description: Including pain rating scale, medication(s)/side effects and non-pharmacologic comfort measures Outcome: Progressing   Problem: Health Behavior/Discharge Planning: Goal: Ability to manage health-related needs will improve Outcome: Progressing   Problem: Clinical Measurements: Goal: Ability to maintain clinical measurements within normal limits will improve Outcome: Progressing Goal: Will remain free from infection Outcome: Progressing Goal: Diagnostic test results will improve Outcome: Progressing Goal: Respiratory complications will improve Outcome: Progressing Goal: Cardiovascular complication will be avoided Outcome: Progressing   Problem: Activity: Goal: Risk for activity intolerance will decrease Outcome: Progressing   Problem: Nutrition: Goal: Adequate nutrition will be maintained Outcome: Progressing   Problem: Coping: Goal: Level of anxiety will decrease Outcome: Progressing   Problem: Elimination: Goal: Will not experience complications related to bowel motility Outcome: Progressing Goal: Will not experience complications related to urinary retention Outcome: Progressing   Problem: Pain Managment: Goal: General experience of comfort will improve Outcome: Progressing   Problem: Safety: Goal: Ability to remain free from injury will improve Outcome: Progressing   Problem: Skin Integrity: Goal: Risk for impaired skin integrity will decrease Outcome: Progressing   Problem: Education: Goal: Knowledge of condition and prescribed therapy will improve Outcome: Progressing   Problem: Cardiac: Goal: Will achieve and/or maintain adequate cardiac output Outcome: Progressing   Problem: Physical Regulation: Goal: Complications related to the disease process, condition or treatment will be avoided or minimized Outcome: Progressing   Problem: Education: Goal: Ability to describe self-care measures that may prevent or decrease complications  (Diabetes Survival Skills Education) will improve Outcome: Progressing Goal: Individualized Educational Video(s) Outcome: Progressing   Problem: Cardiac: Goal: Ability to maintain an adequate cardiac output will improve Outcome: Progressing   Problem: Health Behavior/Discharge Planning: Goal: Ability to identify and utilize available resources and services will improve Outcome: Progressing Goal: Ability to manage health-related needs will improve Outcome: Progressing   Problem: Fluid Volume: Goal: Ability to achieve a balanced intake and output will improve Outcome: Progressing   Problem: Metabolic:  Goal: Ability to maintain appropriate glucose levels will improve Outcome: Progressing   Problem: Nutritional: Goal: Maintenance of adequate nutrition will improve Outcome: Progressing Goal: Maintenance of adequate weight for body size and type will improve Outcome: Progressing   Problem: Respiratory: Goal: Will regain and/or maintain adequate ventilation Outcome: Progressing   Problem: Urinary Elimination: Goal: Ability to achieve and maintain adequate renal perfusion and functioning will improve Outcome: Progressing

## 2022-09-11 NOTE — Progress Notes (Signed)
   09/11/22 1936  BiPAP/CPAP/SIPAP  BiPAP/CPAP/SIPAP Pt Type  (standby pt is not ready to wear it yet)  BiPAP/CPAP/SIPAP V60  Mask Type Full face mask  Mask Size Medium  Set Rate 0 breaths/min  Respiratory Rate 22 breaths/min  EPAP 8 cmH2O  FiO2 (%) 40 %   Stand by pt is not ready and doing well. No resp distress noted.

## 2022-09-12 DIAGNOSIS — J189 Pneumonia, unspecified organism: Secondary | ICD-10-CM | POA: Diagnosis not present

## 2022-09-12 LAB — COMPREHENSIVE METABOLIC PANEL
ALT: 38 U/L (ref 0–44)
AST: 49 U/L — ABNORMAL HIGH (ref 15–41)
Albumin: 2.6 g/dL — ABNORMAL LOW (ref 3.5–5.0)
Alkaline Phosphatase: 67 U/L (ref 38–126)
Anion gap: 13 (ref 5–15)
BUN: 21 mg/dL — ABNORMAL HIGH (ref 6–20)
CO2: 22 mmol/L (ref 22–32)
Calcium: 9.3 mg/dL (ref 8.9–10.3)
Chloride: 109 mmol/L (ref 98–111)
Creatinine, Ser: 0.63 mg/dL (ref 0.61–1.24)
GFR, Estimated: >60 mL/min (ref >60)
Glucose, Bld: 154 mg/dL — ABNORMAL HIGH (ref 70–99)
Potassium: 3 mmol/L — ABNORMAL LOW (ref 3.5–5.1)
Sodium: 144 mmol/L (ref 135–145)
Total Bilirubin: 1.5 mg/dL — ABNORMAL HIGH (ref 0.3–1.2)
Total Protein: 7 g/dL (ref 6.5–8.1)

## 2022-09-12 LAB — CBC
HCT: 40.6 % (ref 39.0–52.0)
Hemoglobin: 14 g/dL (ref 13.0–17.0)
MCH: 31.8 pg (ref 26.0–34.0)
MCHC: 34.5 g/dL (ref 30.0–36.0)
MCV: 92.3 fL (ref 80.0–100.0)
Platelets: 172 10*3/uL (ref 150–400)
RBC: 4.4 MIL/uL (ref 4.22–5.81)
RDW: 14.4 % (ref 11.5–15.5)
WBC: 10 10*3/uL (ref 4.0–10.5)
nRBC: 0 % (ref 0.0–0.2)

## 2022-09-12 LAB — MAGNESIUM: Magnesium: 1.7 mg/dL (ref 1.7–2.4)

## 2022-09-12 LAB — BETA-HYDROXYBUTYRIC ACID: Beta-Hydroxybutyric Acid: 3.71 mmol/L — ABNORMAL HIGH (ref 0.05–0.27)

## 2022-09-12 LAB — GLUCOSE, CAPILLARY
Glucose-Capillary: 149 mg/dL — ABNORMAL HIGH (ref 70–99)
Glucose-Capillary: 164 mg/dL — ABNORMAL HIGH (ref 70–99)
Glucose-Capillary: 160 mg/dL — ABNORMAL HIGH (ref 70–99)
Glucose-Capillary: 165 mg/dL — ABNORMAL HIGH (ref 70–99)

## 2022-09-12 MED ORDER — HYDRALAZINE HCL 20 MG/ML IJ SOLN
5.0000 mg | INTRAMUSCULAR | Status: DC | PRN
  Administered 2022-09-12: 5 mg via INTRAVENOUS

## 2022-09-12 MED ORDER — FUROSEMIDE 10 MG/ML IJ SOLN
40.0000 mg | Freq: Once | INTRAMUSCULAR | Status: AC
  Administered 2022-09-12: 40 mg via INTRAVENOUS
  Filled 2022-09-12: qty 4

## 2022-09-12 MED ORDER — VANCOMYCIN VARIABLE DOSE PER UNSTABLE RENAL FUNCTION (PHARMACIST DOSING): Status: DC

## 2022-09-12 MED ORDER — POLYETHYLENE GLYCOL 3350 17 G PO PACK
17.0000 g | PACK | Freq: Every day | ORAL | Status: DC
  Administered 2022-09-12 – 2022-09-18 (×5): 17 g via ORAL
  Filled 2022-09-12 (×7): qty 1

## 2022-09-12 MED ORDER — POTASSIUM CHLORIDE CRYS ER 20 MEQ PO TBCR
40.0000 meq | EXTENDED_RELEASE_TABLET | ORAL | Status: AC
  Administered 2022-09-12 (×2): 40 meq via ORAL
  Filled 2022-09-12 (×2): qty 2

## 2022-09-12 MED ORDER — HYDRALAZINE HCL 20 MG/ML IJ SOLN
INTRAMUSCULAR | Status: AC
  Filled 2022-09-12: qty 1

## 2022-09-12 NOTE — Plan of Care (Signed)
  Problem: Education: Goal: Ability to describe self-care measures that may prevent or decrease complications (Diabetes Survival Skills Education) will improve Outcome: Progressing   Problem: Coping: Goal: Ability to adjust to condition or change in health will improve Outcome: Progressing   

## 2022-09-12 NOTE — Progress Notes (Signed)
PROGRESS NOTE  Bradley Mcclure:[id number] DOB: 02/11/1961 DOA: 09/05/2022 PCP: [facility]   LOS: 6 days   Brief Narrative / Interim history: 62 year old male with prior history of MRSA bacteremia, anxiety, depression, mild cognitive disorder, HTN, HLD, DM2, NAFLD, prior PE on chronic anticoagulation, obesity comes to the hospital with several days of URI type symptoms, cough, chest congestion and eventually passing out.  In the ED he was found to have right lower lobe pneumonia, he was started on antibiotics and admitted to the hospital  Subjective / 24h Interval events: No overnight events, he is doing well this morning.  He denies any chest pain, denies any shortness of breath.  Assesement and Plan: Principal Problem:   Multifocal pneumonia Active Problems:   Hyperlipidemia   Major depressive disorder   Pulmonary embolism (HCC)   Essential hypertension   Type 2 diabetes mellitus (HCC)   GERD (gastroesophageal reflux disease)   Principal problem Sepsis due to multifocal pneumonia -met sepsis criteria with leukocytosis, fever, tachycardia, tachypnea, and a source as evidenced on imaging, patient was started on ceftriaxone and azithromycin.  On 5/20 4 AM, due to increasing leukocytosis and increasing work of breathing patient was transferred to stepdown and antibiotics were broadened to vancomycin and cefepime.  Continue for now -Continue BiPAP intermittently, he required his overnight but is comfortable on nasal cannula this morning. -CT scan 5/25 with worsening airspace consolidation, and also development of a small right and left pleural effusions.  Suspect he is becoming fluid overloaded in the setting of insulin and dextrose infusions.  Insulin and dextrose now discontinued, continue Lasix today  Active problems Euglycemic DKA -ABG on 5/24 showed significant metabolic acidosis partially compensated by respiratory alkalosis.  Initially there was no apparent cause  for his metabolic acidosis with normal lactic acid, borderline anion gap, however beta hydroxybutyrate was found to be elevated suggesting euglycemic DKA.  Patient is on Jardiance at home, but he has been off of it while here.  He was maintained on insulin drip with improvement in his bicarb and beta-hydroxybutyrate.  He was started on long-acting insulin, continue.  Allow a diet.  Clinically he is DKA seems resolved  History of PE-continue Xarelto  Essential hypertension-continue losartan.  2D echo done 5/22 shows normal LVEF 60 to 65%, no WMA, LVH, RV was normal.  Obesity, borderline-BMI 30.25  DM2-continue home regimen  Lab Results  Component Value Date   HGBA1C 6.8 (H) 09/06/2022   CBG (last 3)  Recent Labs    09/11/22 1651 09/11/22 2200 09/12/22 0753  GLUCAP 161* 134* 149*    Thrombocytopenia-possibly in the setting of acute illness, platelets have since normalized  History of depression-continue Abilify  Scheduled Meds:  amLODipine  10 mg Oral Daily   ARIPiprazole  2 mg Oral Daily   Chlorhexidine Gluconate Cloth  6 each Topical Daily   insulin aspart  0-5 Units Subcutaneous QHS   insulin aspart  0-9 Units Subcutaneous TID WC   insulin glargine-yfgn  10 Units Subcutaneous Daily   ipratropium-albuterol  3 mL Nebulization BID   losartan  50 mg Oral Daily   metoprolol tartrate  25 mg Oral BID   potassium chloride  40 mEq Oral Q3H   rivaroxaban  20 mg Oral Q supper   sodium chloride flush  3 mL Intravenous Q12H   Continuous Infusions:  ceFEPime (MAXIPIME) IV 2 g (09/12/22 0906)   vancomycin Stopped (09/11/22 2357)   PRN Meds:.acetaminophen, albuterol, dextrose, guaiFENesin, metoprolol tartrate, ondansetron (ZOFRAN)  IV, mouth rinse, sodium chloride  Current Outpatient Medications  Medication Instructions   acetaminophen (TYLENOL) 650 mg, Oral, Every 6 hours PRN   ARIPiprazole (ABILIFY) 2 mg, Oral, Daily   empagliflozin (JARDIANCE) 25 mg, Oral, Daily   FLUoxetine  (PROZAC) 20 mg, Oral, 2 times daily PRN   HYDROcodone-acetaminophen (NORCO/VICODIN) 5-325 MG tablet 1-2 tablets, Oral, Every 6 hours PRN   insulin aspart (NOVOLOG) 10-20 Units, Subcutaneous, 2 times daily   insulin glargine (LANTUS) 75 Units, Subcutaneous, 2 times daily   losartan (COZAAR) 50 mg, Oral, Daily   metFORMIN (GLUCOPHAGE) 1,000 mg, Oral, 2 times daily with meals   mupirocin ointment (BACTROBAN) 2 % 1 application , Nasal, 2 times daily   polyethylene glycol (MIRALAX / GLYCOLAX) 17 g, Oral, Daily   rivaroxaban (XARELTO) 20 mg, Oral, Daily with supper   Semaglutide (2 MG/DOSE) 2 mg, Subcutaneous, Every 7 days   senna (SENOKOT) 17.2 mg, Oral, 2 times daily   silver sulfADIAZINE (SILVADENE) 1 % cream Topical, Daily   Skin Protectants, Misc. (INTERDRY AG TEXTILE 78"G956") SHEE 2 packets, Apply externally, Weekly   sulfamethoxazole-trimethoprim (BACTRIM DS,SEPTRA DS) 800-160 MG tablet 2 tablets, Oral, Every 12 hours    Diet Orders (From admission, onward)     Start     Ordered   09/11/22 1335  Diet Carb Modified Fluid consistency: Thin; Room service appropriate? Yes  Diet effective now       Question Answer Comment  Diet-HS Snack? Nothing   Calorie Level Medium 1600-2000   Fluid consistency: Thin   Room service appropriate? Yes      09/11/22 1335            DVT prophylaxis:  rivaroxaban (XARELTO) tablet 20 mg   Lab Results  Component Value Date   PLT 172 09/12/2022      Code Status: Full Code  Family Communication: no family at bedside   Status is: Inpatient Remains inpatient appropriate because: severity of illness   Level of care: Stepdown  Consultants:  none  Objective: Vitals:   09/12/22 0500 09/12/22 0700 09/12/22 0755 09/12/22 0800  BP: 128/61 128/73  (!) 168/83  Pulse: 90 95  98  Resp: (!) 23 15  (!) 23  Temp: 98.2 F (36.8 C)   98.5 F (36.9 C)  TempSrc: Oral   Oral  SpO2: 94% 95% 96% 93%  Weight: 54.2 kg     Height:         Intake/Output Summary (Last 24 hours) at 09/12/2022 0955 Last data filed at 09/12/2022 0800 Gross per 24 hour  Intake 2813.82 ml  Output 4050 ml  Net -1236.18 ml    Wt Readings from Last 3 Encounters:  09/12/22 54.2 kg  04/25/16 135.6 kg  03/01/16 (!) 137 kg    Examination:  Constitutional: NAD Eyes: lids and conjunctivae normal, no scleral icterus ENMT: mmm Neck: normal, supple Respiratory: clear to auscultation bilaterally, no wheezing, no crackles. Normal respiratory effort.  Cardiovascular: Regular rate and rhythm, no murmurs / rubs / gallops. No LE edema. Abdomen: soft, no distention, no tenderness. Bowel sounds positive.   Data Reviewed: I have independently reviewed following labs and imaging studies   CBC Recent Labs  Lab 09/05/22 2335 09/06/22 0505 09/07/22 0419 09/08/22 0351 09/09/22 0054 09/10/22 0201 09/11/22 0300 09/12/22 0254  WBC 13.4* 11.7*   < > 18.0* 21.8* 17.8* 12.8* 10.0  HGB 14.7 13.3   < > 13.7 14.6 14.4 13.7 14.0  HCT 42.6 40.1   < >  41.9 44.9 43.7 41.3 40.6  PLT 139* 135*   < > 148* 189 184 196 172  MCV 92.8 94.1   < > 96.1 96.1 95.6 94.9 92.3  MCH 32.0 31.2   < > 31.4 31.3 31.5 31.5 31.8  MCHC 34.5 33.2   < > 32.7 32.5 33.0 33.2 34.5  RDW 13.0 13.0   < > 13.4 13.8 14.3 14.6 14.4  LYMPHSABS 0.4* 1.1  --   --   --   --   --   --   MONOABS 0.9 1.1*  --   --   --   --   --   --   EOSABS 0.0 0.1  --   --   --   --   --   --   BASOSABS 0.0 0.0  --   --   --   --   --   --    < > = values in this interval not displayed.     Recent Labs  Lab 09/05/22 2335 09/05/22 2335 09/05/22 2350 09/06/22 0145 09/06/22 0505 09/07/22 0419 09/08/22 0351 09/09/22 0054 09/09/22 0626 09/09/22 1035 09/10/22 0201 09/10/22 1104 09/10/22 1856 09/11/22 0300 09/11/22 1054 09/11/22 1758 09/12/22 0254  NA 133*  --   --   --  136 137 132* 132*  --    < > 133*   < > 141 143 145 143 144  K 3.0*  --   --   --  3.5 3.9 4.2 4.3  --    < > 4.8   < > 3.4*  3.6 3.2* 3.0* 3.0*  CL 100  --   --   --  103 104 103 105  --    < > 110   < > 113* 112* 113* 108 109  CO2 20*  --   --   --  23 21* 14* 12*  --    < > 10*   < > 15* 20* 20* 20* 22  GLUCOSE 262*  --   --   --  114* 121* 93 148*  --    < > 131*   < > 144* 147* 149* 160* 154*  BUN 13  --   --   --  10 11 10 19   --    < > 27*   < > 24* 22* 22* 18 21*  CREATININE 0.80  --   --   --  0.63 0.59* 0.67 0.86  --    < > 0.72   < > 0.63 0.64 0.62 0.62 0.63  CALCIUM 8.8*  --   --   --  8.1* 8.5* 8.5* 9.7  --    < > 9.1   < > 9.6 9.6 9.5 9.6 9.3  AST 18  --   --   --   --  27 21  --   --   --   --   --   --  38  --   --  49*  ALT 14  --   --   --   --  16 16  --   --   --   --   --   --  26  --   --  38  ALKPHOS 60  --   --   --   --  60 68  --   --   --   --   --   --  64  --   --  67  BILITOT 1.2  --   --   --   --  1.2 1.7*  --   --   --   --   --   --  1.1  --   --  1.5*  ALBUMIN 3.6  --   --   --   --  3.2* 3.0*  --   --   --   --   --   --  2.6*  --   --  2.6*  MG  --    < >  --   --  1.8  --  1.5* 1.9  --   --  2.0  --   --  1.9  --   --  1.7  LATICACIDVEN  --   --  1.9 1.5  --   --   --   --  1.2  --   --   --   --   --   --   --   --   INR 1.4*  --   --   --   --   --   --   --   --   --   --   --   --   --   --   --   --   HGBA1C  --   --   --   --  6.8*  --   --   --   --   --   --   --   --   --   --   --   --    < > = values in this interval not displayed.     ------------------------------------------------------------------------------------------------------------------ No results for input(s): "CHOL", "HDL", "LDLCALC", "TRIG", "CHOLHDL", "LDLDIRECT" in the last 72 hours.  Lab Results  Component Value Date   HGBA1C 6.8 (H) 09/06/2022   ------------------------------------------------------------------------------------------------------------------ No results for input(s): "TSH", "T4TOTAL", "T3FREE", "THYROIDAB" in the last 72 hours.  Invalid input(s): "FREET3"  Cardiac Enzymes No  results for input(s): "CKMB", "TROPONINI", "MYOGLOBIN" in the last 168 hours.  Invalid input(s): "CK" ------------------------------------------------------------------------------------------------------------------    Component Value Date/Time   BNP 61.7 09/05/2022 0059    CBG: Recent Labs  Lab 09/11/22 1335 09/11/22 1428 09/11/22 1651 09/11/22 2200 09/12/22 0753  GLUCAP 142* 143* 161* 134* 149*     Recent Results (from the past 240 hour(s))  Culture, blood (Routine x 2)     Status: None   Collection Time: 09/05/22 11:15 PM   Specimen: BLOOD  Result Value Ref Range Status   Specimen Description   Final    BLOOD SITE NOT SPECIFIED Performed at [facility], [address], [place], [place] [address]    Special Requests   Final    BOTTLES DRAWN AEROBIC AND ANAEROBIC Blood Culture results may not be optimal due to an excessive volume of blood received in culture bottles Performed at [facility], [address], [place], [place] [address]    Culture   Final    NO GROWTH 5 DAYS Performed at [facility], [address], [place], [place] [address]    Report Status 09/11/2022 FINAL  Final  Resp panel by RT-PCR (RSV, Flu A&B, Covid) Anterior Nasal Swab     Status: None   Collection Time: 09/05/22 11:17 PM   Specimen: Anterior Nasal Swab  Result Value Ref Range Status   SARS Coronavirus 2 by RT PCR NEGATIVE NEGATIVE Final  Comment: (NOTE) SARS-CoV-2 target nucleic acids are NOT DETECTED.  The SARS-CoV-2 RNA is generally detectable in upper respiratory specimens during the acute phase of infection. The lowest concentration of SARS-CoV-2 viral copies this assay can detect is 138 copies/mL. A negative result does not preclude SARS-Cov-2 infection and should not be used as the sole basis for treatment or other patient management decisions. A negative result may occur with  improper specimen collection/handling, submission of  specimen other than nasopharyngeal swab, presence of viral mutation(s) within the areas targeted by this assay, and inadequate number of viral copies(<138 copies/mL). A negative result must be combined with clinical observations, patient history, and epidemiological information. The expected result is Negative.  Fact Sheet for Patients:  BloggerCourse.com  Fact Sheet for Healthcare Providers:  SeriousBroker.it  This test is no t yet approved or cleared by the [place] FDA and  has been authorized for detection and/or diagnosis of SARS-CoV-2 by FDA under an Emergency Use Authorization (EUA). This EUA will remain  in effect (meaning this test can be used) for the duration of the COVID-19 declaration under Section 564(b)(1) of the Act, 21 U.S.C.section 360bbb-3(b)(1), unless the authorization is terminated  or revoked sooner.       Influenza A by PCR NEGATIVE NEGATIVE Final   Influenza B by PCR NEGATIVE NEGATIVE Final    Comment: (NOTE) The Xpert Xpress SARS-CoV-2/FLU/RSV plus assay is intended as an aid in the diagnosis of influenza from Nasopharyngeal swab specimens and should not be used as a sole basis for treatment. Nasal washings and aspirates are unacceptable for Xpert Xpress SARS-CoV-2/FLU/RSV testing.  Fact Sheet for Patients: BloggerCourse.com  Fact Sheet for Healthcare Providers: SeriousBroker.it  This test is not yet approved or cleared by the [place] FDA and has been authorized for detection and/or diagnosis of SARS-CoV-2 by FDA under an Emergency Use Authorization (EUA). This EUA will remain in effect (meaning this test can be used) for the duration of the COVID-19 declaration under Section 564(b)(1) of the Act, 21 U.S.C. section 360bbb-3(b)(1), unless the authorization is terminated or revoked.     Resp Syncytial Virus by PCR NEGATIVE NEGATIVE Final     Comment: (NOTE) Fact Sheet for Patients: BloggerCourse.com  Fact Sheet for Healthcare Providers: SeriousBroker.it  This test is not yet approved or cleared by the [place] FDA and has been authorized for detection and/or diagnosis of SARS-CoV-2 by FDA under an Emergency Use Authorization (EUA). This EUA will remain in effect (meaning this test can be used) for the duration of the COVID-19 declaration under Section 564(b)(1) of the Act, 21 U.S.C. section 360bbb-3(b)(1), unless the authorization is terminated or revoked.  Performed at [facility], [address] [place], [place] [address]   Culture, blood (Routine x 2)     Status: None   Collection Time: 09/06/22 12:25 AM   Specimen: BLOOD  Result Value Ref Range Status   Specimen Description   Final    BLOOD SITE NOT SPECIFIED Performed at [facility], [address] [place], [place] [address]    Special Requests   Final    BOTTLES DRAWN AEROBIC AND ANAEROBIC Blood Culture adequate volume Performed at [facility], [address], [place], [place] [address]    Culture   Final    NO GROWTH 5 DAYS Performed at [facility], [address] [place], [place] [address]    Report Status 09/11/2022 FINAL  Final  MRSA Next Gen by PCR,  Nasal     Status: Abnormal   Collection Time: 09/09/22  7:54 AM   Specimen: Nasal Mucosa; Nasal Swab  Result Value Ref Range Status   MRSA by PCR Next Gen DETECTED (A) NOT DETECTED Final    Comment: (NOTE) The GeneXpert MRSA Assay (FDA approved for NASAL specimens only), is one component of a comprehensive MRSA colonization surveillance program. It is not intended to diagnose MRSA infection nor to guide or monitor treatment for MRSA infections. Test performance is not FDA approved in patients less than 38 years old. Performed at [facility], [address] [place], [place] [address]      Radiology Studies: No results found.   Pamella Pert, MD, PhD [facility]  Between 7 am - 7 pm I am available, please contact me via Amion (for emergencies) or Securechat (non urgent messages)  Between 7 pm - 7 am I am not available, please contact night coverage MD/APP via Amion

## 2022-09-12 NOTE — Progress Notes (Signed)
Pharmacy Antibiotic Note  Bradley Mcclure is a 62 y.o. male admitted on 09/05/2022 with pneumonia.  He completed 5 days of azithromycin and 4 days of ceftriaxone.  Pharmacy was consulted to escalate antibiotic coverage to Cefepime and vancomycin due to worsening leukocytosis, fever.  Today, 09/12/2022: WBC down to 10, Afebrile  Weights charted over the past few days have changed drastically:  5/27 54.2 kg, RN re-check weight 58.4 kg 5/26 65.1 kg  5/25 55.4 kg 5/23 103.1 kg   Plan: Continue Cefepime 2g IV q8h  Vancomycin:  Due to significant weight change, he has been getting higher vancomycin doses than estimated needs.  Currently getting 1250mg  q12, but new estimates calculates vancomycin dose as 750 mg IV q12h  (SCr rounded 0.8, TBW< IBW, est AUC 491). Measure Vanc trough with morning labs (assume 12h dosing interval at 23:00 tonight will be to early) and re-dose vancomycin based on labs.   Follow up renal function, culture results, and clinical course.   Height: 6\' 1"  (185.4 cm) Weight: 54.2 kg (119 lb 7.8 oz) IBW/kg (Calculated) : 79.9  Temp (24hrs), Avg:98.3 F (36.8 C), Min:97.8 F (36.6 C), Max:98.9 F (37.2 C)  Recent Labs  Lab 09/05/22 2350 09/06/22 0145 09/06/22 0505 09/08/22 0351 09/09/22 0054 09/09/22 0626 09/09/22 1035 09/10/22 0201 09/10/22 1104 09/10/22 1856 09/11/22 0300 09/11/22 1054 09/11/22 1758 09/12/22 0254  WBC  --   --    < > 18.0* 21.8*  --   --  17.8*  --   --  12.8*  --   --  10.0  CREATININE  --   --    < > 0.67 0.86  --    < > 0.72   < > 0.63 0.64 0.62 0.62 0.63  LATICACIDVEN 1.9 1.5  --   --   --  1.2  --   --   --   --   --   --   --   --    < > = values in this interval not displayed.    Estimated Creatinine Clearance: 76.2 mL/min (by C-G formula based on SCr of 0.63 mg/dL).    No Known Allergies  Antimicrobials this admission: 5/21 azithromycin >>5/25 5/21 ceftriaxone >>5/24 5/24 Vanco>> 5/24 Cefepime>>  Dose adjustments  this admission: 5/27 Empiric dose adjustment based on current weight/renal function.   5/28 vancomycin random level:   Microbiology results: 5/21 Bcx: neg 5/20 Sputum Cx: ngtd 5/20, Fluvid, RSV: neg 5/24: MRSA PCR: +  Thank you for allowing pharmacy to be a part of this patient's care.  Lynann Beaver PharmD, BCPS [facility] main pharmacy [phone] 09/12/2022 12:23 PM

## 2022-09-13 DIAGNOSIS — J189 Pneumonia, unspecified organism: Secondary | ICD-10-CM | POA: Diagnosis not present

## 2022-09-13 LAB — GLUCOSE, CAPILLARY
Glucose-Capillary: 157 mg/dL — ABNORMAL HIGH (ref 70–99)
Glucose-Capillary: 180 mg/dL — ABNORMAL HIGH (ref 70–99)
Glucose-Capillary: 273 mg/dL — ABNORMAL HIGH (ref 70–99)
Glucose-Capillary: 176 mg/dL — ABNORMAL HIGH (ref 70–99)

## 2022-09-13 LAB — COMPREHENSIVE METABOLIC PANEL
ALT: 86 U/L — ABNORMAL HIGH (ref 0–44)
AST: 109 U/L — ABNORMAL HIGH (ref 15–41)
Albumin: 2.5 g/dL — ABNORMAL LOW (ref 3.5–5.0)
Alkaline Phosphatase: 70 U/L (ref 38–126)
Anion gap: 11 (ref 5–15)
BUN: 21 mg/dL — ABNORMAL HIGH (ref 6–20)
CO2: 25 mmol/L (ref 22–32)
Calcium: 8.9 mg/dL (ref 8.9–10.3)
Chloride: 101 mmol/L (ref 98–111)
Creatinine, Ser: 0.44 mg/dL — ABNORMAL LOW (ref 0.61–1.24)
GFR, Estimated: >60 mL/min (ref >60)
Glucose, Bld: 152 mg/dL — ABNORMAL HIGH (ref 70–99)
Potassium: 3 mmol/L — ABNORMAL LOW (ref 3.5–5.1)
Sodium: 137 mmol/L (ref 135–145)
Total Bilirubin: 1.3 mg/dL — ABNORMAL HIGH (ref 0.3–1.2)
Total Protein: 7 g/dL (ref 6.5–8.1)

## 2022-09-13 LAB — CBC
HCT: 41.1 % (ref 39.0–52.0)
Hemoglobin: 13.8 g/dL (ref 13.0–17.0)
MCH: 30.9 pg (ref 26.0–34.0)
MCHC: 33.6 g/dL (ref 30.0–36.0)
MCV: 92.2 fL (ref 80.0–100.0)
Platelets: 154 10*3/uL (ref 150–400)
RBC: 4.46 MIL/uL (ref 4.22–5.81)
RDW: 14.1 % (ref 11.5–15.5)
WBC: 9.4 10*3/uL (ref 4.0–10.5)
nRBC: 0 % (ref 0.0–0.2)

## 2022-09-13 LAB — MAGNESIUM: Magnesium: 1.7 mg/dL (ref 1.7–2.4)

## 2022-09-13 LAB — VANCOMYCIN, RANDOM: Vancomycin Rm: 4 ug/mL

## 2022-09-13 MED ORDER — FUROSEMIDE 10 MG/ML IJ SOLN
40.0000 mg | Freq: Once | INTRAMUSCULAR | Status: AC
  Administered 2022-09-13: 40 mg via INTRAVENOUS
  Filled 2022-09-13: qty 4

## 2022-09-13 MED ORDER — CHLORHEXIDINE GLUCONATE CLOTH 2 % EX PADS
6.0000 | MEDICATED_PAD | Freq: Every day | CUTANEOUS | Status: AC
  Administered 2022-09-13 – 2022-09-17 (×5): 6 via TOPICAL

## 2022-09-13 MED ORDER — VANCOMYCIN HCL 1250 MG/250ML IV SOLN
1250.0000 mg | Freq: Two times a day (BID) | INTRAVENOUS | Status: AC
  Administered 2022-09-13: 1250 mg via INTRAVENOUS
  Filled 2022-09-13: qty 250

## 2022-09-13 MED ORDER — MUPIROCIN 2 % EX OINT
1.0000 | TOPICAL_OINTMENT | Freq: Two times a day (BID) | CUTANEOUS | Status: AC
  Administered 2022-09-13 – 2022-09-17 (×10): 1 via NASAL
  Filled 2022-09-13: qty 22

## 2022-09-13 MED ORDER — VANCOMYCIN HCL 1250 MG/250ML IV SOLN
1250.0000 mg | Freq: Once | INTRAVENOUS | Status: AC
  Administered 2022-09-13: 1250 mg via INTRAVENOUS
  Filled 2022-09-13: qty 250

## 2022-09-13 MED ORDER — POTASSIUM CHLORIDE CRYS ER 20 MEQ PO TBCR
40.0000 meq | EXTENDED_RELEASE_TABLET | ORAL | Status: AC
  Administered 2022-09-13 (×2): 40 meq via ORAL
  Filled 2022-09-13 (×2): qty 2

## 2022-09-13 MED ORDER — MAGNESIUM SULFATE 2 GM/50ML IV SOLN
2.0000 g | Freq: Once | INTRAVENOUS | Status: AC
  Administered 2022-09-13: 2 g via INTRAVENOUS
  Filled 2022-09-13: qty 50

## 2022-09-13 NOTE — Progress Notes (Addendum)
Pharmacy Antibiotic Note  Bradley Mcclure is a 62 y.o. male admitted on 09/05/2022 with pneumonia.  He completed 5 days of azithromycin and 4 days of ceftriaxone.  Pharmacy was consulted to escalate antibiotic coverage to Cefepime and vancomycin due to worsening leukocytosis, fever.  Weights charted over the past few days have changed drastically:  5/28 65.1 kg 5/27 54.2 kg, RN re-check weight 58.4 kg 5/26 65.1 kg  5/25 55.4 kg 5/23 103.1 kg   Plan: Discontinue abx after doses today.  Cefepime 2g IV q8h Vancomycin 1250mg  IV q12h x2 doses (SCr rounded 0.8, TBW< IBW) Measure Vanc trough/peak as needed for Goal AUC 400-500.  Follow up renal function, culture results, and clinical course.    Height: 6\' 1"  (185.4 cm) Weight: 65.1 kg (143 lb 8.3 oz) IBW/kg (Calculated) : 79.9  Temp (24hrs), Avg:98.2 F (36.8 C), Min:98 F (36.7 C), Max:98.5 F (36.9 C)  Recent Labs  Lab 09/09/22 0054 09/09/22 0626 09/09/22 1035 09/10/22 0201 09/10/22 1104 09/11/22 0300 09/11/22 1054 09/11/22 1758 09/12/22 0254 09/13/22 0258  WBC 21.8*  --   --  17.8*  --  12.8*  --   --  10.0 9.4  CREATININE 0.86  --    < > 0.72   < > 0.64 0.62 0.62 0.63 0.44*  LATICACIDVEN  --  1.2  --   --   --   --   --   --   --   --   VANCORANDOM  --   --   --   --   --   --   --   --   --  4   < > = values in this interval not displayed.     Estimated Creatinine Clearance: 91.5 mL/min (A) (by C-G formula based on SCr of 0.44 mg/dL (L)).    No Known Allergies  Antimicrobials this admission: 5/21 azithromycin >>5/25 5/21 ceftriaxone >>5/24 5/24 Vancomycin >>(5/28) 5/24 Cefepime>>(5/28)  Dose adjustments this admission: 5/27 Empiric dose adjustment based on current weight/renal function.   5/28 vancomycin random level: 4  Microbiology results: 5/21 Bcx: neg 5/20 Sputum Cx: ngtd 5/20, Fluvid, RSV: neg 5/24: MRSA PCR: +  Thank you for allowing pharmacy to be a part of this patient's care.  Lynann Beaver PharmD, BCPS [facility] main pharmacy [phone] 09/13/2022 7:14 AM

## 2022-09-13 NOTE — Progress Notes (Signed)
Patient with speech/movements very delayed. A&Ox3 (unsure about situation), but seems confused at times. Patient lifted his arms/elbows and then left them frozen in the air for several minutes. Per patient, this severe delay is new since admission. MD made aware. Patient able to stand and pivot to [facility], but is a heavy x2 assist and requires constant direction. Will continue to monitor.

## 2022-09-13 NOTE — Progress Notes (Signed)
While eating breakfast, patient began coughing and appeared to choke on scrambled eggs. Patient continued to cough and inhale/gasp through mouth with food in it, did not breathe through nose even with coaching. Pt was able to recover on his own and did not need intervention. RN did remove patient's eggs and stayed with patient to be sure he quit eating until he quit coughing. Pt did not appear to be eating safely. O2 Sats stable at 93-95% RA. MD made aware, ST Eval ordered.

## 2022-09-13 NOTE — Progress Notes (Signed)
PROGRESS NOTE  Bradley BOCOOK ZOX:[id number] DOB: May 24, 1960 DOA: 09/05/2022 PCP: [facility]   LOS: 7 days   Brief Narrative / Interim history: 62 year old male with prior history of MRSA bacteremia, anxiety, depression, mild cognitive disorder, HTN, HLD, DM2, NAFLD, prior PE on chronic anticoagulation, obesity comes to the hospital with several days of URI type symptoms, cough, chest congestion and eventually passing out.  In the ED he was found to have right lower lobe pneumonia, he was started on antibiotics and admitted to the hospital.  Hospital course complicated by worsening metabolic acidosis which did not have a clear etiology initially, however it was found to be due to euglycemic DKA.  He was moved to stepdown, maintained on insulin drip for 2 days with improvement in his acidosis  Subjective / 24h Interval events: No events overnight, doing well this morning.  Denies any shortness of breath, denies any chest pain, no abdominal pain nausea or vomiting  Assesement and Plan: Principal Problem:   Multifocal pneumonia Active Problems:   Hyperlipidemia   Major depressive disorder   Pulmonary embolism (HCC)   Essential hypertension   Type 2 diabetes mellitus (HCC)   GERD (gastroesophageal reflux disease)   Principal problem Sepsis due to multifocal pneumonia -met sepsis criteria with leukocytosis, fever, tachycardia, tachypnea, and a source as evidenced on imaging, patient was started on ceftriaxone and azithromycin.  On 5/24 AM, due to increasing leukocytosis and increasing work of breathing patient was transferred to stepdown and antibiotics were broadened to vancomycin and cefepime.  Today would be the last day of antibiotics, has received 8 days total.  He is afebrile and leukocytosis has resolved -CT scan 5/25 with worsening airspace consolidation, and also development of a small right and left pleural effusions.  Suspect he is becoming fluid overloaded in the  setting of insulin and dextrose infusions.  Insulin and dextrose now discontinued, dose again Lasix this morning.  Active problems Euglycemic DKA -ABG on 5/24 showed significant metabolic acidosis partially compensated by respiratory alkalosis.  Initially there was no apparent cause for his metabolic acidosis with normal lactic acid, borderline anion gap, however beta hydroxybutyrate was found to be elevated suggesting euglycemic DKA.  Patient is on Jardiance at home, but he has been off of it while here.  He was maintained on insulin drip with improvement in his bicarb and beta-hydroxybutyrate.  He was started on long-acting insulin, continue.  He has remained stable, bicarb is normalized this morning, transfer out of stepdown  History of PE-continue Xarelto  Hypokalemia -continue to replace potassium  Essential hypertension-continue losartan.  2D echo done 5/22 shows normal LVEF 60 to 65%, no WMA, LVH, RV was normal.  DM2-continue home regimen  Lab Results  Component Value Date   HGBA1C 6.8 (H) 09/06/2022   CBG (last 3)  Recent Labs    09/12/22 1116 09/12/22 1719 09/12/22 2128  GLUCAP 164* 160* 165*    Thrombocytopenia-possibly in the setting of acute illness, platelets have since normalized  History of depression-continue Abilify  Scheduled Meds:  amLODipine  10 mg Oral Daily   ARIPiprazole  2 mg Oral Daily   Chlorhexidine Gluconate Cloth  6 each Topical Daily   furosemide  40 mg Intravenous Once   insulin aspart  0-5 Units Subcutaneous QHS   insulin aspart  0-9 Units Subcutaneous TID WC   insulin glargine-yfgn  10 Units Subcutaneous Daily   ipratropium-albuterol  3 mL Nebulization BID   losartan  50 mg Oral Daily  metoprolol tartrate  25 mg Oral BID   polyethylene glycol  17 g Oral Daily   potassium chloride  40 mEq Oral Q3H   rivaroxaban  20 mg Oral Q supper   sodium chloride flush  3 mL Intravenous Q12H   vancomycin variable dose per unstable renal function  (pharmacist dosing)   Does not apply See admin instructions   Continuous Infusions:  ceFEPime (MAXIPIME) IV Stopped (09/13/22 0325)   magnesium sulfate bolus IVPB     PRN Meds:.acetaminophen, albuterol, dextrose, guaiFENesin, hydrALAZINE, metoprolol tartrate, ondansetron (ZOFRAN) IV, mouth rinse, sodium chloride  Current Outpatient Medications  Medication Instructions   acetaminophen (TYLENOL) 650 mg, Oral, Every 6 hours PRN   ARIPiprazole (ABILIFY) 2 mg, Oral, Daily   empagliflozin (JARDIANCE) 25 mg, Oral, Daily   FLUoxetine (PROZAC) 20 mg, Oral, 2 times daily PRN   HYDROcodone-acetaminophen (NORCO/VICODIN) 5-325 MG tablet 1-2 tablets, Oral, Every 6 hours PRN   insulin aspart (NOVOLOG) 10-20 Units, Subcutaneous, 2 times daily   insulin glargine (LANTUS) 75 Units, Subcutaneous, 2 times daily   losartan (COZAAR) 50 mg, Oral, Daily   metFORMIN (GLUCOPHAGE) 1,000 mg, Oral, 2 times daily with meals   mupirocin ointment (BACTROBAN) 2 % 1 application , Nasal, 2 times daily   polyethylene glycol (MIRALAX / GLYCOLAX) 17 g, Oral, Daily   rivaroxaban (XARELTO) 20 mg, Oral, Daily with supper   Semaglutide (2 MG/DOSE) 2 mg, Subcutaneous, Every 7 days   senna (SENOKOT) 17.2 mg, Oral, 2 times daily   silver sulfADIAZINE (SILVADENE) 1 % cream Topical, Daily   Skin Protectants, Misc. (INTERDRY AG TEXTILE 16"X096") SHEE 2 packets, Apply externally, Weekly   sulfamethoxazole-trimethoprim (BACTRIM DS,SEPTRA DS) 800-160 MG tablet 2 tablets, Oral, Every 12 hours    Diet Orders (From admission, onward)     Start     Ordered   09/11/22 1335  Diet Carb Modified Fluid consistency: Thin; Room service appropriate? Yes  Diet effective now       Question Answer Comment  Diet-HS Snack? Nothing   Calorie Level Medium 1600-2000   Fluid consistency: Thin   Room service appropriate? Yes      09/11/22 1335            DVT prophylaxis:  rivaroxaban (XARELTO) tablet 20 mg   Lab Results  Component Value  Date   PLT 154 09/13/2022      Code Status: Full Code  Family Communication: no family at bedside, he tells me he has no family and no one for me to call and update  Status is: Inpatient Remains inpatient appropriate because: severity of illness   Level of care: Stepdown  Consultants:  none  Objective: Vitals:   09/13/22 0300 09/13/22 0400 09/13/22 0500 09/13/22 0600  BP: (!) 118/51 (!) 149/47 (!) 161/58 (!) 132/48  Pulse: 89   95  Resp: (!) 24 (!) 23 (!) 23 (!) 25  Temp:  98 F (36.7 C)    TempSrc:  Oral    SpO2: 93%   94%  Weight:   65.1 kg   Height:        Intake/Output Summary (Last 24 hours) at 09/13/2022 0636 Last data filed at 09/13/2022 0600 Gross per 24 hour  Intake 2367.38 ml  Output 3650 ml  Net -1282.62 ml    Wt Readings from Last 3 Encounters:  09/13/22 65.1 kg  04/25/16 135.6 kg  03/01/16 (!) 137 kg    Examination:  Constitutional: NAD Eyes: lids and conjunctivae normal, no scleral icterus  ENMT: mmm Neck: normal, supple Respiratory: clear to auscultation bilaterally, no wheezing, no crackles. Normal respiratory effort.  Cardiovascular: Regular rate and rhythm, no murmurs / rubs / gallops. No LE edema. Abdomen: soft, no distention, no tenderness. Bowel sounds positive.  Skin: no rashes  Data Reviewed: I have independently reviewed following labs and imaging studies   CBC Recent Labs  Lab 09/09/22 0054 09/10/22 0201 09/11/22 0300 09/12/22 0254 09/13/22 0258  WBC 21.8* 17.8* 12.8* 10.0 9.4  HGB 14.6 14.4 13.7 14.0 13.8  HCT 44.9 43.7 41.3 40.6 41.1  PLT 189 184 196 172 154  MCV 96.1 95.6 94.9 92.3 92.2  MCH 31.3 31.5 31.5 31.8 30.9  MCHC 32.5 33.0 33.2 34.5 33.6  RDW 13.8 14.3 14.6 14.4 14.1     Recent Labs  Lab 09/07/22 0419 09/07/22 0419 09/08/22 0351 09/09/22 0054 09/09/22 0626 09/09/22 1035 09/10/22 0201 09/10/22 1104 09/11/22 0300 09/11/22 1054 09/11/22 1758 09/12/22 0254 09/13/22 0258  NA 137  --  132* 132*  --     < > 133*   < > 143 145 143 144 137  K 3.9  --  4.2 4.3  --    < > 4.8   < > 3.6 3.2* 3.0* 3.0* 3.0*  CL 104  --  103 105  --    < > 110   < > 112* 113* 108 109 101  CO2 21*  --  14* 12*  --    < > 10*   < > 20* 20* 20* 22 25  GLUCOSE 121*  --  93 148*  --    < > 131*   < > 147* 149* 160* 154* 152*  BUN 11  --  10 19  --    < > 27*   < > 22* 22* 18 21* 21*  CREATININE 0.59*  --  0.67 0.86  --    < > 0.72   < > 0.64 0.62 0.62 0.63 0.44*  CALCIUM 8.5*  --  8.5* 9.7  --    < > 9.1   < > 9.6 9.5 9.6 9.3 8.9  AST 27  --  21  --   --   --   --   --  38  --   --  49* 109*  ALT 16  --  16  --   --   --   --   --  26  --   --  38 86*  ALKPHOS 60  --  68  --   --   --   --   --  64  --   --  67 70  BILITOT 1.2  --  1.7*  --   --   --   --   --  1.1  --   --  1.5* 1.3*  ALBUMIN 3.2*  --  3.0*  --   --   --   --   --  2.6*  --   --  2.6* 2.5*  MG  --    < > 1.5* 1.9  --   --  2.0  --  1.9  --   --  1.7 1.7  LATICACIDVEN  --   --   --   --  1.2  --   --   --   --   --   --   --   --    < > = values in this interval not displayed.     ------------------------------------------------------------------------------------------------------------------  No results for input(s): "CHOL", "HDL", "LDLCALC", "TRIG", "CHOLHDL", "LDLDIRECT" in the last 72 hours.  Lab Results  Component Value Date   HGBA1C 6.8 (H) 09/06/2022   ------------------------------------------------------------------------------------------------------------------ No results for input(s): "TSH", "T4TOTAL", "T3FREE", "THYROIDAB" in the last 72 hours.  Invalid input(s): "FREET3"  Cardiac Enzymes No results for input(s): "CKMB", "TROPONINI", "MYOGLOBIN" in the last 168 hours.  Invalid input(s): "CK" ------------------------------------------------------------------------------------------------------------------    Component Value Date/Time   BNP 61.7 09/05/2022 0059    CBG: Recent Labs  Lab 09/11/22 2200 09/12/22 0753  09/12/22 1116 09/12/22 1719 09/12/22 2128  GLUCAP 134* 149* 164* 160* 165*     Recent Results (from the past 240 hour(s))  Culture, blood (Routine x 2)     Status: None   Collection Time: 09/05/22 11:15 PM   Specimen: BLOOD  Result Value Ref Range Status   Specimen Description   Final    BLOOD SITE NOT SPECIFIED Performed at [facility], [address], [place], [place] [address]    Special Requests   Final    BOTTLES DRAWN AEROBIC AND ANAEROBIC Blood Culture results may not be optimal due to an excessive volume of blood received in culture bottles Performed at [facility], [address] [place], [place] [address]    Culture   Final    NO GROWTH 5 DAYS Performed at [facility], [address] [place], [place] [address]    Report Status 09/11/2022 FINAL  Final  Resp panel by RT-PCR (RSV, Flu A&B, Covid) Anterior Nasal Swab     Status: None   Collection Time: 09/05/22 11:17 PM   Specimen: Anterior Nasal Swab  Result Value Ref Range Status   SARS Coronavirus 2 by RT PCR NEGATIVE NEGATIVE Final    Comment: (NOTE) SARS-CoV-2 target nucleic acids are NOT DETECTED.  The SARS-CoV-2 RNA is generally detectable in upper respiratory specimens during the acute phase of infection. The lowest concentration of SARS-CoV-2 viral copies this assay can detect is 138 copies/mL. A negative result does not preclude SARS-Cov-2 infection and should not be used as the sole basis for treatment or other patient management decisions. A negative result may occur with  improper specimen collection/handling, submission of specimen other than nasopharyngeal swab, presence of viral mutation(s) within the areas targeted by this assay, and inadequate number of viral copies(<138 copies/mL). A negative result must be combined with clinical observations, patient history, and epidemiological information. The expected result is Negative.  Fact Sheet for  Patients:  BloggerCourse.com  Fact Sheet for Healthcare Providers:  SeriousBroker.it  This test is no t yet approved or cleared by the [place] FDA and  has been authorized for detection and/or diagnosis of SARS-CoV-2 by FDA under an Emergency Use Authorization (EUA). This EUA will remain  in effect (meaning this test can be used) for the duration of the COVID-19 declaration under Section 564(b)(1) of the Act, 21 U.S.C.section 360bbb-3(b)(1), unless the authorization is terminated  or revoked sooner.       Influenza A by PCR NEGATIVE NEGATIVE Final   Influenza B by PCR NEGATIVE NEGATIVE Final    Comment: (NOTE) The Xpert Xpress SARS-CoV-2/FLU/RSV plus assay is intended as an aid in the diagnosis of influenza from Nasopharyngeal swab specimens and should not be used as a sole basis for treatment. Nasal washings and aspirates are unacceptable for Xpert Xpress SARS-CoV-2/FLU/RSV testing.  Fact Sheet for Patients: BloggerCourse.com  Fact Sheet for Healthcare Providers: SeriousBroker.it  This test is not  yet approved or cleared by the [place] and has been authorized for detection and/or diagnosis of SARS-CoV-2 by FDA under an Emergency Use Authorization (EUA). This EUA will remain in effect (meaning this test can be used) for the duration of the COVID-19 declaration under Section 564(b)(1) of the Act, 21 U.S.C. section 360bbb-3(b)(1), unless the authorization is terminated or revoked.     Resp Syncytial Virus by PCR NEGATIVE NEGATIVE Final    Comment: (NOTE) Fact Sheet for Patients: BloggerCourse.com  Fact Sheet for Healthcare Providers: SeriousBroker.it  This test is not yet approved or cleared by the [place] FDA and has been authorized for detection and/or diagnosis of SARS-CoV-2 by FDA under an Emergency  Use Authorization (EUA). This EUA will remain in effect (meaning this test can be used) for the duration of the COVID-19 declaration under Section 564(b)(1) of the Act, 21 U.S.C. section 360bbb-3(b)(1), unless the authorization is terminated or revoked.  Performed at [facility], [address], [place], [place] [address]   Culture, blood (Routine x 2)     Status: None   Collection Time: 09/06/22 12:25 AM   Specimen: BLOOD  Result Value Ref Range Status   Specimen Description   Final    BLOOD SITE NOT SPECIFIED Performed at [facility], [address] [place], [place] [address]    Special Requests   Final    BOTTLES DRAWN AEROBIC AND ANAEROBIC Blood Culture adequate volume Performed at [facility], [address], [place], [place] [address]    Culture   Final    NO GROWTH 5 DAYS Performed at [facility], [address], [place], [place] [address]    Report Status 09/11/2022 FINAL  Final  MRSA Next Gen by PCR, Nasal     Status: Abnormal   Collection Time: 09/09/22  7:54 AM   Specimen: Nasal Mucosa; Nasal Swab  Result Value Ref Range Status   MRSA by PCR Next Gen DETECTED (A) NOT DETECTED Final    Comment: (NOTE) The GeneXpert MRSA Assay (FDA approved for NASAL specimens only), is one component of a comprehensive MRSA colonization surveillance program. It is not intended to diagnose MRSA infection nor to guide or monitor treatment for MRSA infections. Test performance is not FDA approved in patients less than 6 years old. Performed at [facility], [address], [place], [place] [address]      Radiology Studies: No results found.   Pamella Pert, MD, PhD [facility]  Between 7 am - 7 pm I am available, please contact me via Amion (for emergencies) or Securechat (non urgent messages)  Between 7 pm - 7 am I am not available, please contact night coverage MD/APP via  Amion

## 2022-09-13 NOTE — Progress Notes (Signed)
   09/12/22 2025  BiPAP/CPAP/SIPAP  BiPAP/CPAP/SIPAP Pt Type Adult (V60(CPAP) STBY PRN)  BiPAP/CPAP /SiPAP Vitals  Bilateral Breath Sounds Clear;Diminished

## 2022-09-13 NOTE — Evaluation (Signed)
Clinical/Bedside Swallow Evaluation Patient Details  Name: Bradley Mcclure MRN: [id number] Date of Birth: 26-Oct-1960  Today's Date: 09/13/2022 Time: SLP Start Time (ACUTE ONLY): 1310 SLP Stop Time (ACUTE ONLY): 1341 SLP Time Calculation (min) (ACUTE ONLY): 31 min  Past Medical History:  Past Medical History:  Diagnosis Date   Balanitis 12/24/2015   Cellulitis and abscess 12/24/2015   Generalized anxiety disorder 09/06/2022   Hyperlipidemia 09/06/2022   Hypertension    IBS (irritable bowel syndrome)    Major depressive disorder 09/06/2022   Mild cognitive disorder 09/06/2022   Dec 07, 2016 Entered By: Polly Cobia Comment: Medical Disabilty 12.2017 -Jul 10, 2018 Entered By: Tenna Delaine Comment: diagnosed at [place] [facility]   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   Non-alcoholic fatty liver disease 04/19/2011   Dec 07, 2016 Entered By: Polly Cobia Comment: Urged to modify lifestyle &amp; Urged again 2018 to begin Coffee 2-4/d for hepatoprotection   Pneumonia 2014-2015 X 1   Pulmonary embolism (HCC) 03/21/2013   Sepsis (HCC) 11/11/2015   Type II diabetes mellitus (HCC)    Past Surgical History:  Past Surgical History:  Procedure Laterality Date   TEE WITHOUT CARDIOVERSION N/A 11/17/2015   Procedure: TRANSESOPHAGEAL ECHOCARDIOGRAM (TEE);  Surgeon: Chilton Si, MD;  Location: [facility] ENDOSCOPY;  Service: Cardiovascular;  Laterality: N/A;   TONSILLECTOMY  ~ 1977   HPI:  Pt is a 62 yo male adm to [facility] 5/20 after several days of URI  - with cough, congestion and eventually passing out.  PMH + for MRSA bacteremia, anxiety, depression, mild cognitive disorder, HTN, HLD, DM2, NAFLD, prior PE on chronic anticoagulation, obesity.  Medical Disabilty 40.9811 -Jul 10, 2018 - dx at [place].  Per RN, pt reports to her that he takes Abilify.  Pt had an episode of coughing/choking on eggs this am - concerning RN for airway protection - thus SLP swallow eval ordered.    Assessment / Plan / Recommendation   Clinical Impression  Patient presents with cognitive based dysphagia characterized by prolonged mastication of solids with patient holding bolus into his mouth without adequate clearance.  His responses to questions and requests are significantly delayed and he often maintains eye closure during session.  No focal cranial nerve deficits observed but patient does appear deconditioned.  Easily passed out small water screen contributing to suspicions of cognitive/mentation based oral deficits likely cause of potential aspiration episode.   Despite verbal cues to swallow, pt ceased mastication without adequate oral clearance.  SLP provided icecream that to aid oral transit for airway protection.  This time recommend patient consume a soft solid food for supervision for compensation strategies.  Using teach back patient was able to articulate that he may "choke" if he is not masticating and clearing food from mouth.  Will follow-up for tolerance given today's event and concerns for worsening chest imaging.  Hopeful for diet to be able to be with improved mentation.  SLP spoke to RN regarding recommendations.  Thank you for this consult SLP Visit Diagnosis: Dysphagia, oral phase (R13.11)    Aspiration Risk  Mild aspiration risk    Diet Recommendation Dysphagia 3 (Mech soft);Thin liquid   Liquid Administration via: Cup;Straw Medication Administration: Whole meds with puree Supervision: Full supervision/cueing for compensatory strategies Compensations: Slow rate;Small sips/bites (Use pure to help oral transit masticated solids) Postural Changes: Seated upright at 90 degrees;Remain upright for at least 30 minutes after po intake    Other  Recommendations Oral Care Recommendations: Oral care BID    Recommendations for  follow up therapy are one component of a multi-disciplinary discharge planning process, led by the attending physician.  Recommendations may be updated based on patient status, additional  functional criteria and insurance authorization.  Follow up Recommendations Follow physician's recommendations for discharge plan and follow up therapies      Assistance Recommended at Discharge    Functional Status Assessment Patient has had a recent decline in their functional status and demonstrates the ability to make significant improvements in function in a reasonable and predictable amount of time.  Frequency and Duration min 1 x/week  1 week       Prognosis Prognosis for improved oropharyngeal function: Good Barriers to Reach Goals: Cognitive deficits      Swallow Study   General Date of Onset: 09/13/22 HPI: Pt is a 62 yo male adm to [facility] 5/20 after several days of URI  - with cough, congestion and eventually passing out.  PMH + for MRSA bacteremia, anxiety, depression, mild cognitive disorder, HTN, HLD, DM2, NAFLD, prior PE on chronic anticoagulation, obesity.  Medical Disabilty 78.2956 -Jul 10, 2018 - dx at [place].  Per RN, pt reports to her that he takes Abilify.  Pt had an episode of coughing/choking on eggs this am - concerning RN for airway protection - thus SLP swallow eval ordered. Type of Study: Bedside Swallow Evaluation Previous Swallow Assessment: none in chart Diet Prior to this Study: Dysphagia 3 (mechanical soft);Thin liquids (Level 0) Temperature Spikes Noted: No Respiratory Status: Nasal cannula History of Recent Intubation: No Behavior/Cognition: Lethargic/Drowsy;Other (Comment) (delayed responses) Oral Cavity Assessment: Dry Oral Care Completed by SLP: No Oral Cavity - Dentition: Adequate natural dentition Self-Feeding Abilities:  (able to hold his own cup for self feeding) Patient Positioning: Upright in bed Baseline Vocal Quality: Low vocal intensity Volitional Cough: Weak Volitional Swallow: Unable to elicit (did not swallow on command)    Oral/Motor/Sensory Function Overall Oral Motor/Sensory Function: Generalized oral weakness   Ice Chips Ice chips:  Within functional limits Presentation: Spoon   Thin Liquid Thin Liquid: Within functional limits Presentation: Self Fed;Straw    Nectar Thick Nectar Thick Liquid: Not tested   Honey Thick Honey Thick Liquid: Not tested   Puree Puree: Impaired Presentation: Spoon Oral Phase Impairments: Poor awareness of bolus;Other (comment) (vs lethargy) Oral Phase Functional Implications: Oral holding Pharyngeal Phase Impairments: Suspected delayed Swallow   Solid     Solid: Impaired Oral Phase Impairments: Impaired mastication;Poor awareness of bolus Oral Phase Functional Implications: Prolonged oral transit;Impaired mastication;Oral holding;Other (comment);Oral residue (pt closing his eyes with food retained in his mouth)      Chales Abrahams 09/13/2022,2:24 PM  Rolena Infante, MS [facility] SLP Acute Rehab Services Office [phone]

## 2022-09-13 NOTE — Evaluation (Signed)
Occupational Therapy Evaluation Patient Details Name: Bradley Mcclure MRN: [id number] DOB: Jun 14, 1960 Today's Date: 09/13/2022   History of Present Illness Patient is a 62 year old male who presented with chest congestion cough and episode of passing out. Patient was admitted with right lower lobe pneumonia with sepsis. Patients stay was complicated with worsening metabolic acidosis due to euglycemic DKA and transitioned to SDU. PMH: DM II, hypokalemia, HTN, PE, depression   Clinical Impression   Patient is a 62 year old male who was admitted for above. 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 Patient was +2 for transfer with increased time. Patient was slow to process cues and participate in movements. Patient was noted to have decreased functional activity tolerance, decreased endurance, decreased standing balance, decreased safety awareness, and decreased knowledge of AD/AE impacting participation in ADLs. Patient would continue to benefit from skilled OT services at this time while admitted and after d/c to address noted deficits in order to improve overall safety and independence in ADLs.       Recommendations for follow up therapy are one component of a multi-disciplinary discharge planning process, led by the attending physician.  Recommendations may be updated based on patient status, additional functional criteria and insurance authorization.   Assistance Recommended at Discharge Frequent or constant Supervision/Assistance  Patient can return home with the following Two people to help with walking and/or transfers;A lot of help with bathing/dressing/bathroom;Assistance with cooking/housework;Direct supervision/assist for medications management;Assist for transportation;Direct supervision/assist for financial management;Help with stairs or ramp for entrance    Functional Status Assessment  Patient has had a recent decline in their functional status  and demonstrates the ability to make significant improvements in function in a reasonable and predictable amount of time.  Equipment Recommendations  None recommended by OT       Precautions / Restrictions Precautions Precautions: Fall Restrictions Weight Bearing Restrictions: No      Mobility Bed Mobility Overal bed mobility: Needs Assistance Bed Mobility: Supine to Sit     Supine to sit: Mod assist, HOB elevated     General bed mobility comments: with increased A with multimodal cues.            Balance Overall balance assessment: Mild deficits observed, not formally tested               ADL either performed or assessed with clinical judgement   ADL Overall ADL's : Needs assistance/impaired Eating/Feeding: Supervision/ safety;Sitting   Grooming: Min guard Grooming Details (indicate cue type and reason): with increased time with all movements. Upper Body Bathing: Minimal assistance;Sitting   Lower Body Bathing: Total assistance;Sitting/lateral leans;Sit to/from stand   Upper Body Dressing : Minimal assistance;Sitting   Lower Body Dressing: Sitting/lateral leans;Sit to/from stand;Total assistance   Toilet Transfer: +2 for safety/equipment;+2 for physical assistance;Ambulation;Rolling walker (2 wheels);Minimal assistance Toilet Transfer Details (indicate cue type and reason): mod A for sit to stand with increased A and cues for leaning forwards. Toileting- Clothing Manipulation and Hygiene: Total assistance;Sit to/from stand                Pertinent Vitals/Pain Pain Assessment Pain Assessment: No/denies pain     Hand Dominance Right   Extremity/Trunk Assessment Upper Extremity Assessment Upper Extremity Assessment: Difficult to assess due to impaired cognition (patient noted to lift shoulders to about 70 degrees AROM then able to tolerate to 90 degrees with AAROM with reports of no shoulder injuries. shoulders are tight.)   Lower  Extremity  Assessment Lower Extremity Assessment: Generalized weakness   Cervical / Trunk Assessment Cervical / Trunk Assessment: Normal   Communication     Cognition Arousal/Alertness: Lethargic Behavior During Therapy: Flat affect Overall Cognitive Status: Difficult to assess         General Comments: patient was soft spoken, reported today was monday march 31st                Home Living Family/patient expects to be discharged to:: Private residence Living Arrangements: Alone   Type of Home: House       Home Layout: One level                   Additional Comments: pt. 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        OT Problem List: Decreased activity tolerance;Impaired balance (sitting and/or standing);Decreased coordination;Decreased safety awareness;Decreased knowledge of precautions;Decreased knowledge of use of DME or AE      OT Treatment/Interventions: Self-care/ADL training;Energy conservation;Therapeutic exercise;DME and/or AE instruction;Therapeutic activities;Patient/family education;Balance training    OT Goals(Current goals can be found in the care plan section) Acute Rehab OT Goals Patient Stated Goal: none stated OT Goal Formulation: With patient Time For Goal Achievement: 09/27/22 Potential to Achieve Goals: Fair  OT Frequency: Min 2X/week    Co-evaluation PT/OT/SLP Co-Evaluation/Treatment: Yes Reason for Co-Treatment: To address functional/ADL transfers;For patient/therapist safety PT goals addressed during session: Mobility/safety with mobility OT goals addressed during session: ADL's and self-care      AM-PAC OT "6 Clicks" Daily Activity     Outcome Measure Help from another person eating  meals?: A Little Help from another person taking care of personal grooming?: A Little Help from another person toileting, which includes using toliet, bedpan, or urinal?: A Lot Help from another person bathing (including washing, rinsing, drying)?: A Lot Help from another person to put on and taking off regular upper body clothing?: A Lot Help from another person to put on and taking off regular lower body clothing?: A Lot 6 Click Score: 14   End of Session Equipment Utilized During Treatment: Gait belt;Rolling walker (2 wheels) Nurse Communication: Mobility status  Activity Tolerance: Patient tolerated treatment well Patient left: in chair;with call bell/phone within reach;with chair alarm set  OT Visit Diagnosis: Unsteadiness on feet (R26.81);Other abnormalities of gait and mobility (R26.89);History of falling (Z91.81)                Time: 1610-9604 OT Time Calculation (min): 20 min Charges:  OT General Charges $OT Visit: 1 Visit OT Evaluation $OT Eval Moderate Complexity: 1 Mod  Hung Rhinesmith OTR/L, MS Acute Rehabilitation Department Office# [phone]   Selinda Flavin 09/13/2022, 1:48 PM

## 2022-09-13 NOTE — TOC Initial Note (Signed)
Transition of Care [facility]) - Initial/Assessment Note    Patient Details  Name: Bradley Mcclure MRN: [id number] Date of Birth: July 02, 1960  [facility]) CM/SW Contact:    Lanier Clam, RN Phone Number: 09/13/2022, 4:02 PM  Clinical Narrative:  patient agrees to ST SNF per PT recc. [facility] form in shadow chart for MD signature.                   Barriers to Discharge: Continued Medical Work up   Patient Goals and CMS Choice Patient states their goals for this hospitalization and ongoing recovery are:: Rehab CMS Medicare.gov Compare Post Acute Care list provided to:: Patient Choice offered to / list presented to : Patient [place] ownership interest in [facility].provided to:: Patient    Expected Discharge Plan and Services     Post Acute Care Choice: Skilled Nursing Facility Living arrangements for the past 2 months: Single Family Home                                      Prior Living Arrangements/Services Living arrangements for the past 2 months: Single Family Home Lives with:: Self                   Activities of Daily Living Home Assistive Devices/Equipment: Eyeglasses ADL Screening (condition at time of admission) Patient's cognitive ability adequate to safely complete daily activities?: Yes Is the patient deaf or have difficulty hearing?: No Does the patient have difficulty seeing, even when wearing glasses/contacts?: No Does the patient have difficulty concentrating, remembering, or making decisions?: No Patient able to express need for assistance with ADLs?: No Does the patient have difficulty dressing or bathing?: No Independently performs ADLs?: Yes (appropriate for developmental age) Does the patient have difficulty walking or climbing stairs?: No Weakness of Legs: None Weakness of Arms/Hands: Both  Permission Sought/Granted                  Emotional Assessment              Admission diagnosis:  Syncope  and collapse [R55] Multifocal pneumonia [J18.9] Patient Active Problem List   Diagnosis Date Noted   Multifocal pneumonia 09/06/2022   Hyperlipidemia 09/06/2022   Generalized anxiety disorder 09/06/2022   Major depressive disorder 09/06/2022   Type 2 diabetes mellitus with diabetic neuropathy, unspecified (HCC) 09/06/2022   Mild cognitive disorder 09/06/2022   History of retinal detachment 09/06/2022   Essential hypertension 09/06/2022   Type 2 diabetes mellitus (HCC) 09/06/2022   Sepsis due to Staphylococcus aureus (HCC) 03/01/2016   Cellulitis of left lower extremity    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   Cellulitis and abscess 12/24/2015   Balanitis 12/24/2015   Uncontrolled type 2 diabetes mellitus with hyperglycemia, with long-term current use of insulin (HCC)    Bacteremia    Morbid obesity due to excess calories (HCC)    Cellulitis of abdominal wall 11/11/2015   Sepsis (HCC) 11/11/2015   Rash 11/11/2015   Hypertension    Diabetes mellitus without complication (HCC)    Candida-induced panniculitis    GERD (gastroesophageal reflux disease) 04/19/2015   Pulmonary embolism (HCC) 03/21/2013   Non-alcoholic fatty liver disease 04/19/2011   PCP:  Cl[facility], [facility] Pharmacy:   [id number] - [place], [place] - [address] [place] [address] Phone: [phone] Fax: [phone]  Social Determinants of Health (SDOH) Social History: SDOH Screenings   Food Insecurity: No Food Insecurity (09/06/2022)  Housing: Low Risk  (09/06/2022)  Transportation Needs: No Transportation Needs (09/06/2022)  Utilities: Not At Risk (09/06/2022)  Tobacco Use: Low Risk  (09/06/2022)   SDOH Interventions:     Readmission Risk Interventions     No data to display

## 2022-09-13 NOTE — Evaluation (Signed)
Physical Therapy Evaluation Patient Details Name: Bradley Mcclure MRN: [id number] DOB: [birth year] Today's Date: 09/13/2022  History of Present Illness  Patient is a 62 year old male who presented with chest congestion cough and episode of passing out. Patient was admitted with right lower lobe pneumonia with sepsis. Patients stay was complicated with worsening metabolic acidosis due to euglycemic DKA and transitioned to SDU. PMH: DM II, hypokalemia, HTN, PE, depression  Clinical Impression  Pt admitted with above diagnosis.  Pt currently with functional limitations due to the deficits listed below (see PT Problem List). Pt will benefit from acute skilled PT to increase their independence and safety with mobility to allow discharge.       The patient is lethargic, presents with catatonic like state , he raises arms and they stay in the air, He stops  during mobility and stares, does not make eye contact.  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 Reports having no family of friends to assist after Dc.  SPO2  on 4 L, 96%, HR 108.    Recommendations for follow up therapy are one component of a multi-disciplinary discharge planning process, led by the attending physician.  Recommendations may be updated based on patient status, additional functional criteria and insurance authorization.  Follow Up Recommendations Can patient physically be transported by private vehicle: No     Assistance Recommended at Discharge    Patient can return home with the following  A lot of help with walking and/or transfers;A lot of help with bathing/dressing/bathroom;Assistance with cooking/housework;Assist for transportation;Help with stairs or ramp for entrance    Equipment Recommendations Rolling walker (2 wheels)  Recommendations for Other Services       Functional Status Assessment Patient has had a recent decline in their functional status and demonstrates the ability to  make significant improvements in function in a reasonable and predictable amount of time.     Precautions / Restrictions Precautions Precautions: Fall Restrictions Weight Bearing Restrictions: No      Mobility  Bed Mobility   Bed Mobility: Supine to Sit     Supine to sit: Mod assist, HOB elevated     General bed mobility comments: with increased A with multimodal cues. frequent cues to stay on task, patient stops moving and is in a catatonic like state    Transfers Overall transfer level: Needs assistance Equipment used: Rolling walker (2 wheels) Transfers: Sit to/from Stand, Bed to chair/wheelchair/BSC Sit to Stand: Mod assist, +2 safety/equipment, From elevated surface   Step pivot transfers: Mod assist, +2 safety/equipment       General transfer comment: multimodal cues to stay on task , able to shuffle steps to recliner  With multimodal cues to keep moving.  Ambulation/Gait                  Stairs            Wheelchair Mobility    Modified Rankin (Stroke Patients Only)       Balance Overall balance assessment: Mild deficits observed, not formally tested                                           Pertinent Vitals/Pain Pain Assessment Pain Assessment: No/denies pain    Home Living Family/patient expects to be discharged to:: Private residence Living Arrangements: Alone   Type of Home:  House         Home Layout: One level   Additional Comments: pt. 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     Hand Dominance   Dominant Hand: Right    Extremity/Trunk Assessment   Upper Extremity Assessment Upper Extremity Assessment: Difficult to assess due to impaired cognition (patient noted  to lift shoulders to about 70 degrees AROM then able to tolerate to 90 degrees with AAROM with reports of no shoulder injuries. shoulders are tight.)    Lower Extremity Assessment Lower Extremity Assessment: Generalized weakness    Cervical / Trunk Assessment Cervical / Trunk Assessment: Normal  Communication      Cognition Arousal/Alertness: Lethargic Behavior During Therapy: Flat affect Overall Cognitive Status: Difficult to assess Area of Impairment: Orientation, Attention, Following commands, Awareness, Problem solving                 Orientation Level: Situation Current Attention Level: Focused   Following Commands: Follows one step commands inconsistently   Awareness: Intellectual Problem Solving: Slow processing General Comments: patient was soft spoken, reported today was [facility] march 31st        General Comments      Exercises     Assessment/Plan    PT Assessment Patient needs continued PT services  PT Problem List Decreased strength;Decreased activity tolerance;Decreased mobility;Decreased cognition;Decreased safety awareness;Decreased balance;Decreased coordination;Decreased knowledge of precautions       PT Treatment Interventions DME instruction;Therapeutic activities;Gait training;Functional mobility training;Therapeutic exercise;Cognitive remediation    PT Goals (Current goals can be found in the Care Plan section)  Acute Rehab PT Goals Patient Stated Goal: unable to state PT Goal Formulation: Patient unable to participate in goal setting Time For Goal Achievement: 09/27/22 Potential to Achieve Goals: Good    Frequency Min 1X/week     Co-evaluation PT/OT/SLP Co-Evaluation/Treatment: Yes Reason for Co-Treatment: To address functional/ADL transfers;For patient/therapist safety PT goals addressed during session: Mobility/safety with mobility OT goals addressed during session: ADL's and self-care       AM-PAC PT "6 Clicks" Mobility   Outcome Measure Help needed turning from your back to your side while in a flat bed without using bedrails?: A Lot Help needed moving from lying on your back to sitting on the side of a flat bed without using bedrails?: A Lot Help needed moving to and from a bed to a chair (including a wheelchair)?: A Lot Help needed standing up from a chair using your arms (e.g., wheelchair or bedside chair)?: A Lot Help needed to walk in hospital room?: Total Help needed climbing 3-5 steps with a railing? : Total 6 Click Score: 10    End of Session Equipment Utilized During Treatment: Gait belt;Oxygen Activity Tolerance: Patient limited by fatigue Patient left: in chair;with call bell/phone within reach;with chair alarm set Nurse Communication: Mobility status PT Visit Diagnosis: Unsteadiness on feet (R26.81);Muscle weakness (generalized) (M62.81);Other abnormalities of gait and mobility (R26.89);Difficulty in walking, not elsewhere classified (R26.2)    Time: 1610-9604 PT Time Calculation (min) (ACUTE ONLY): 15 min   Charges:   PT Evaluation $PT Eval Low Complexity: 1 Low          Blanchard Kelch PT Acute Rehabilitation Services Office [phone] Weekend [phone]  Rada Hay 09/13/2022, 12:59 PM

## 2022-09-14 DIAGNOSIS — J189 Pneumonia, unspecified organism: Secondary | ICD-10-CM | POA: Diagnosis not present

## 2022-09-14 DIAGNOSIS — E119 Type 2 diabetes mellitus without complications: Secondary | ICD-10-CM

## 2022-09-14 DIAGNOSIS — R55 Syncope and collapse: Secondary | ICD-10-CM | POA: Diagnosis not present

## 2022-09-14 DIAGNOSIS — Z794 Long term (current) use of insulin: Secondary | ICD-10-CM

## 2022-09-14 LAB — COMPREHENSIVE METABOLIC PANEL
ALT: 71 U/L — ABNORMAL HIGH (ref 0–44)
AST: 47 U/L — ABNORMAL HIGH (ref 15–41)
Albumin: 2.4 g/dL — ABNORMAL LOW (ref 3.5–5.0)
Alkaline Phosphatase: 64 U/L (ref 38–126)
Anion gap: 10 (ref 5–15)
BUN: 16 mg/dL (ref 6–20)
CO2: 27 mmol/L (ref 22–32)
Calcium: 8.8 mg/dL — ABNORMAL LOW (ref 8.9–10.3)
Chloride: 100 mmol/L (ref 98–111)
Creatinine, Ser: 0.45 mg/dL — ABNORMAL LOW (ref 0.61–1.24)
GFR, Estimated: >60 mL/min (ref >60)
Glucose, Bld: 167 mg/dL — ABNORMAL HIGH (ref 70–99)
Potassium: 3 mmol/L — ABNORMAL LOW (ref 3.5–5.1)
Sodium: 137 mmol/L (ref 135–145)
Total Bilirubin: 1.1 mg/dL (ref 0.3–1.2)
Total Protein: 6.9 g/dL (ref 6.5–8.1)

## 2022-09-14 LAB — CBC
HCT: 42.3 % (ref 39.0–52.0)
Hemoglobin: 13.9 g/dL (ref 13.0–17.0)
MCH: 30.6 pg (ref 26.0–34.0)
MCHC: 32.9 g/dL (ref 30.0–36.0)
MCV: 93.2 fL (ref 80.0–100.0)
Platelets: 149 10*3/uL — ABNORMAL LOW (ref 150–400)
RBC: 4.54 MIL/uL (ref 4.22–5.81)
RDW: 13.8 % (ref 11.5–15.5)
WBC: 8 10*3/uL (ref 4.0–10.5)
nRBC: 0 % (ref 0.0–0.2)

## 2022-09-14 LAB — GLUCOSE, CAPILLARY
Glucose-Capillary: 162 mg/dL — ABNORMAL HIGH (ref 70–99)
Glucose-Capillary: 300 mg/dL — ABNORMAL HIGH (ref 70–99)
Glucose-Capillary: 111 mg/dL — ABNORMAL HIGH (ref 70–99)
Glucose-Capillary: 178 mg/dL — ABNORMAL HIGH (ref 70–99)

## 2022-09-14 LAB — MAGNESIUM: Magnesium: 1.7 mg/dL (ref 1.7–2.4)

## 2022-09-14 MED ORDER — INSULIN ASPART 100 UNIT/ML IJ SOLN
3.0000 [IU] | Freq: Three times a day (TID) | INTRAMUSCULAR | Status: DC
  Administered 2022-09-14 – 2022-09-19 (×16): 3 [IU] via SUBCUTANEOUS

## 2022-09-14 MED ORDER — POTASSIUM CHLORIDE CRYS ER 20 MEQ PO TBCR
40.0000 meq | EXTENDED_RELEASE_TABLET | Freq: Two times a day (BID) | ORAL | Status: AC
  Administered 2022-09-14 (×2): 40 meq via ORAL
  Filled 2022-09-14 (×2): qty 2

## 2022-09-14 NOTE — Progress Notes (Signed)
Physical Therapy Treatment Patient Details Name: Bradley Mcclure MRN: [id number] DOB: 08-Aug-1960 Today's Date: 09/14/2022   History of Present Illness 62 year old male with prior history of MRSA bacteremia, anxiety, depression, mild cognitive disorder, HTN, HLD, DM2, NAFLD, prior PE on chronic anticoagulation, obesity comes to the hospital with several days of URI type symptoms, cough, chest congestion and eventually passing out.  In the ED he was found to have right lower lobe pneumonia, he was started on antibiotics and admitted to the hospital.  Hospital course complicated by worsening metabolic acidosis which did not have a clear etiology initially, however it was found to be due to euglycemic DKA.  He was moved to stepdown, maintained on insulin drip for 2 days with improvement in his acidosis.  Currently on 4 east.    PT Comments    General Comments: AxO x 2.5 (improving) but remains 50% groggy, slow Bradykinesia, flat affect.  He was following all commands.  Stated he lives alone.  Never married.  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  He likes to eat "Honey Buns". Assisted OOB to amb was difficult.  General bed mobility comments: Required Mod/Max Assist to transition to EOB.  Slow.  Groggy.  Right lean.  Awake but catatonic.  General transfer comment: Required Mod Asisst + 2 rise from EOB.  Slow.  Poor forward flexed posture.  Groggy.  General Gait Details: amb 30 feet x 2 requiring + 2 Mod Assist as well as Therapist navigating walker around obsticles and thru doorway.  Poor forward flexed posture and sloggish shuffled steps.  Avg stas 91% on 3 lts oxygen. Recliner following for safety. Assisted with Lunch tray set up and opening objects as he was unable.   Unable to wean oxygen.  RA at rest was 87%.   Pt will need ST Rehab at SNF to address mobility and functional decline prior to safely returning home.   Recommendations for follow up therapy are one component of a  multi-disciplinary discharge planning process, led by the attending physician.  Recommendations may be updated based on patient status, additional functional criteria and insurance authorization.  Follow Up Recommendations  Can patient physically be transported by private vehicle: No    Assistance Recommended at Discharge    Patient can return home with the following A lot of help with walking and/or transfers;A lot of help with bathing/dressing/bathroom;Assistance with cooking/housework;Assist for transportation;Help with stairs or ramp for entrance   Equipment Recommendations  Rolling walker (2 wheels)    Recommendations for Other Services       Precautions / Restrictions Precautions Precautions: Fall Precaution Comments: monitor sats Restrictions Weight Bearing Restrictions: No     Mobility  Bed Mobility Overal bed mobility: Needs Assistance Bed Mobility: Supine to Sit     Supine to sit: Mod assist, Max assist     General bed mobility comments: Required Mod/Max Assist to transition to EOB.  Slow.  Groggy.  Right lean.  Awake but catatonic.    Transfers Overall transfer level: Needs assistance Equipment used: Rolling walker (2 wheels) Transfers: Sit to/from Stand Sit to Stand: Mod assist, +2 safety/equipment, From elevated surface           General transfer comment: Required Mod Asisst + 2 rise from EOB.  Slow.  Poor forward flexed posture.  Groggy.    Ambulation/Gait Ambulation/Gait assistance: Mod assist, +2 physical assistance, +2 safety/equipment Gait Distance (Feet): 60 Feet (30 feet x 2) Assistive device: Rolling walker (2 wheels)  Gait Pattern/deviations: Step-through pattern, Decreased stride length, Drifts right/left, Knee flexed in stance - right, Knee flexed in stance - left, Shuffle Gait velocity: decreased     General Gait Details: amb 30 feet x 2 requiring + 2 Mod Assist as well as Therapist navigating walker around obsticles and thru doorway.   Poor forward flexed posture and sloggish shuffled steps.  Recliner following for safety.   Stairs             Wheelchair Mobility    Modified Rankin (Stroke Patients Only)       Balance                                            Cognition Arousal/Alertness: Awake/alert, Lethargic Behavior During Therapy: Flat affect Overall Cognitive Status: Impaired/Different from baseline                                 General Comments: AxO x 2.5 (improving) but remains 50% groggy, slow Bradykinesia, flat affect.  He was following all commands.  Stated he lives alone.  Never married.  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  He likes to eat "Honey Buns".        Exercises      General Comments        Pertinent Vitals/Pain Pain Assessment Pain Assessment: No/denies pain    Home Living                          Prior Function            PT Goals (current goals can now be found in the care plan section) Progress towards PT goals: Progressing toward goals    Frequency    Min 1X/week      PT Plan Current plan remains appropriate    Co-evaluation              AM-PAC PT "6 Clicks" Mobility   Outcome Measure  Help needed turning from your back to your side while in a flat bed without using bedrails?: A Lot Help needed moving from lying on your back to sitting on the side of a flat bed without using bedrails?: A Lot Help needed moving to and from a bed to a chair (including a wheelchair)?: A Lot Help needed standing up from a chair using your arms (e.g., wheelchair or bedside chair)?: A Lot Help needed to walk in hospital room?: A Lot Help needed climbing 3-5 steps with a railing? : Total 6 Click Score: 11    End of Session Equipment Utilized During Treatment: Gait belt;Oxygen Activity Tolerance: Patient limited by fatigue Patient left: in chair;with call bell/phone within reach;with chair alarm  set Nurse Communication: Mobility status PT Visit Diagnosis: Unsteadiness on feet (R26.81);Muscle weakness (generalized) (M62.81);Other abnormalities of gait and mobility (R26.89);Difficulty in walking, not elsewhere classified (R26.2)     Time: 1610-9604 PT Time Calculation (min) (ACUTE ONLY): 24 min  Charges:  $Gait Training: 8-22 mins $Therapeutic Activity: 8-22 mins                     Felecia Shelling  PTA Acute  Rehabilitation Services Office M-F          [phone]

## 2022-09-14 NOTE — Progress Notes (Signed)
[facility] PROGRESS NOTE    Progress Note  Bradley Mcclure  ZOX:[id number] DOB: Nov 12, 1960 DOA: 09/05/2022 PCP: [facility]     Brief Narrative:   Bradley Mcclure is an 62 y.o. male past medical history of MRSA bacteremia, Zaidi, depression, essential hypertension hyperlipidemia nonfatty liver disease, prior history of PE on chronic anticoagulation comes into the hospital for several days of upper respiratory symptoms cough and congestion, hospital course was complicated by metabolic acidosis which turned out to be euglycemic DKA moved to stepdown and started on an insulin drip for 2 days now acidosis has resolved.   Assessment/Plan:   Sepsis secondarily to Multifocal pneumonia Started empirically on IV Rocephin on admission.  Due to increased work of breathing and DKA he was transferred to stepdown. His antibiotics were broadened to vancomycin and cefepime. He has completed 8-day course of antibiotics. Was done and showed bilateral space disease with small bilateral pleural effusion. IV fluids were discontinued she was given IV Lasix. PT consulted will need skilled nursing facility.  Euglycemic DKA/diabetes mellitus type 2: Already has at home, was started on IV insulin his acidosis resolved. Transition to long-acting insulin per sliding scale, will start him on 3 units with meals. Continue CBGs ACHS.  History of PE: Continue Xarelto.  Hypokalemia: Still low replete orally recheck in the morning.  Essential hypertension: Continue statin.  Number cytopenia The setting of acute illness now resolved.  History of depression: Continue Abilify.  Left buttock stage I RN Pressure Injury Documentation: Pressure Injury 09/11/22 Buttocks Left Deep Tissue Pressure Injury - Purple or maroon localized area of discolored intact skin or blood-filled blister due to damage of underlying soft tissue from pressure and/or shear. red, pink, purple (Active)  09/11/22  1918  Location: Buttocks  Location Orientation: Left  Staging: Deep Tissue Pressure Injury - Purple or maroon localized area of discolored intact skin or blood-filled blister due to damage of underlying soft tissue from pressure and/or shear.  Wound Description (Comments): red, pink, purple  Present on Admission: No  Dressing Type Foam - Lift dressing to assess site every shift 09/14/22 0751    Estimated body mass index is 28.3 kg/m as calculated from the following:   Height as of this encounter: 6\' 1"  (1.854 m).   Weight as of this encounter: 97.3 kg.   DVT prophylaxis: lovenox Family Communication:none Status is: Inpatient Remains inpatient appropriate because: Sepsis and DKA    Code Status:     Code Status Orders  (From admission, onward)           Start     Ordered   09/06/22 1409  Full code  Continuous       Question:  By:  Answer:  Consent: discussion documented in EHR   09/06/22 1411           Code Status History     Date Active Date Inactive Code Status Order ID Comments User Context   09/06/2022 0825 09/06/2022 1411 Full Code [id number]  Bobette Mo, MD ED   02/28/2016 1739 03/01/2016 1755 Full Code [id number]  Deneise Lever, MD Inpatient   11/11/2015 0505 11/17/2015 2143 Full Code [id number]  Lorretta Harp, MD Inpatient      Advance Directive Documentation    Flowsheet Row Most Recent Value  Type of Advance Directive Healthcare Power of Attorney, Living will  Pre-existing out of facility DNR order (yellow form or pink MOST form) --  "MOST" Form in Place? --  IV Access:   Peripheral IV   Procedures and diagnostic studies:   No results found.   Medical Consultants:   None.   Subjective:    Bradley Mcclure Counts no complaints this morning in a good mood  Objective:    Vitals:   09/13/22 2022 09/14/22 0541 09/14/22 0724 09/14/22 0925  BP: (!) 149/75 (!) 147/72  (!) 144/77  Pulse: 95 93  100  Resp: (!) 22 14  18   Temp:  98.5 F (36.9 C) 97.8 F (36.6 C)    TempSrc: Oral Oral    SpO2: 94% 92% 93% 95%  Weight:  97.3 kg    Height:       SpO2: 95 % O2 Flow Rate (L/min): 2 L/min FiO2 (%): 32 %   Intake/Output Summary (Last 24 hours) at 09/14/2022 1022 Last data filed at 09/14/2022 0936 Gross per 24 hour  Intake 820 ml  Output 2650 ml  Net -1830 ml   Filed Weights   09/12/22 1230 09/13/22 0500 09/14/22 0541  Weight: 58.4 kg 65.1 kg 97.3 kg    Exam: General exam: In no acute distress. Respiratory system: Good air movement and clear to auscultation. Cardiovascular system: S1 & S2 heard, RRR. No JVD. Gastrointestinal system: Abdomen is nondistended, soft and nontender.  Extremities: No pedal edema. Skin: No rashes, lesions or ulcers Psychiatry: Judgement and insight appear normal. Mood & affect appropriate.    Data Reviewed:    Labs: Basic Metabolic Panel: Recent Labs  Lab 09/09/22 0054 09/09/22 1035 09/10/22 0201 09/10/22 1104 09/11/22 0300 09/11/22 1054 09/11/22 1758 09/12/22 0254 09/13/22 0258 09/14/22 0516  NA 132*   < > 133*   < > 143 145 143 144 137 137  K 4.3   < > 4.8   < > 3.6 3.2* 3.0* 3.0* 3.0* 3.0*  CL 105   < > 110   < > 112* 113* 108 109 101 100  CO2 12*   < > 10*   < > 20* 20* 20* 22 25 27   GLUCOSE 148*   < > 131*   < > 147* 149* 160* 154* 152* 167*  BUN 19   < > 27*   < > 22* 22* 18 21* 21* 16  CREATININE 0.86   < > 0.72   < > 0.64 0.62 0.62 0.63 0.44* 0.45*  CALCIUM 9.7   < > 9.1   < > 9.6 9.5 9.6 9.3 8.9 8.8*  MG 1.9  --  2.0  --  1.9  --   --  1.7 1.7 1.7  PHOS 2.9  --   --   --   --   --   --   --   --   --    < > = values in this interval not displayed.   GFR Estimated Creatinine Clearance: 122.2 mL/min (A) (by C-G formula based on SCr of 0.45 mg/dL (L)). Liver Function Tests: Recent Labs  Lab 09/08/22 0351 09/11/22 0300 09/12/22 0254 09/13/22 0258 09/14/22 0516  AST 21 38 49* 109* 47*  ALT 16 26 38 86* 71*  ALKPHOS 68 64 67 70 64  BILITOT 1.7*  1.1 1.5* 1.3* 1.1  PROT 6.9 6.8 7.0 7.0 6.9  ALBUMIN 3.0* 2.6* 2.6* 2.5* 2.4*   No results for input(s): "LIPASE", "AMYLASE" in the last 168 hours. No results for input(s): "AMMONIA" in the last 168 hours. Coagulation profile No results for input(s): "INR", "PROTIME" in the last 168 hours. COVID-19 Labs  No results for input(s): "DDIMER", "FERRITIN", "LDH", "CRP" in the last 72 hours.  Lab Results  Component Value Date   SARSCOV2NAA NEGATIVE 09/05/2022    CBC: Recent Labs  Lab 09/10/22 0201 09/11/22 0300 09/12/22 0254 09/13/22 0258 09/14/22 0516  WBC 17.8* 12.8* 10.0 9.4 8.0  HGB 14.4 13.7 14.0 13.8 13.9  HCT 43.7 41.3 40.6 41.1 42.3  MCV 95.6 94.9 92.3 92.2 93.2  PLT 184 196 172 154 149*   Cardiac Enzymes: No results for input(s): "CKTOTAL", "CKMB", "CKMBINDEX", "TROPONINI" in the last 168 hours. BNP (last 3 results) No results for input(s): "PROBNP" in the last 8760 hours. CBG: Recent Labs  Lab 09/13/22 0725 09/13/22 1228 09/13/22 1816 09/13/22 2132 09/14/22 0746  GLUCAP 157* 180* 273* 176* 162*   D-Dimer: No results for input(s): "DDIMER" in the last 72 hours. Hgb A1c: No results for input(s): "HGBA1C" in the last 72 hours. Lipid Profile: No results for input(s): "CHOL", "HDL", "LDLCALC", "TRIG", "CHOLHDL", "LDLDIRECT" in the last 72 hours. Thyroid function studies: No results for input(s): "TSH", "T4TOTAL", "T3FREE", "THYROIDAB" in the last 72 hours.  Invalid input(s): "FREET3" Anemia work up: No results for input(s): "VITAMINB12", "FOLATE", "FERRITIN", "TIBC", "IRON", "RETICCTPCT" in the last 72 hours. Sepsis Labs: Recent Labs  Lab 09/09/22 0626 09/10/22 0201 09/11/22 0300 09/12/22 0254 09/13/22 0258 09/14/22 0516  WBC  --    < > 12.8* 10.0 9.4 8.0  LATICACIDVEN 1.2  --   --   --   --   --    < > = values in this interval not displayed.   Microbiology Recent Results (from the past 240 hour(s))  Culture, blood (Routine x 2)     Status: None    Collection Time: 09/05/22 11:15 PM   Specimen: BLOOD  Result Value Ref Range Status   Specimen Description   Final    BLOOD SITE NOT SPECIFIED Performed at [facility], [address] [place], [place] [address]    Special Requests   Final    BOTTLES DRAWN AEROBIC AND ANAEROBIC Blood Culture results may not be optimal due to an excessive volume of blood received in culture bottles Performed at [facility], [address] [place], [place] [address]    Culture   Final    NO GROWTH 5 DAYS Performed at [facility], [address], [place], [place] [address]    Report Status 09/11/2022 FINAL  Final  Resp panel by RT-PCR (RSV, Flu A&B, Covid) Anterior Nasal Swab     Status: None   Collection Time: 09/05/22 11:17 PM   Specimen: Anterior Nasal Swab  Result Value Ref Range Status   SARS Coronavirus 2 by RT PCR NEGATIVE NEGATIVE Final    Comment: (NOTE) SARS-CoV-2 target nucleic acids are NOT DETECTED.  The SARS-CoV-2 RNA is generally detectable in upper respiratory specimens during the acute phase of infection. The lowest concentration of SARS-CoV-2 viral copies this assay can detect is 138 copies/mL. A negative result does not preclude SARS-Cov-2 infection and should not be used as the sole basis for treatment or other patient management decisions. A negative result may occur with  improper specimen collection/handling, submission of specimen other than nasopharyngeal swab, presence of viral mutation(s) within the areas targeted by this assay, and inadequate number of viral copies(<138 copies/mL). A negative result must be combined with clinical observations, patient history, and epidemiological information. The expected result is Negative.  Fact Sheet for Patients:  BloggerCourse.com  Fact Sheet  for Healthcare Providers:  SeriousBroker.it  This test is no t yet approved or cleared  by the [place] and  has been authorized for detection and/or diagnosis of SARS-CoV-2 by FDA under an Emergency Use Authorization (EUA). This EUA will remain  in effect (meaning this test can be used) for the duration of the COVID-19 declaration under Section 564(b)(1) of the Act, 21 U.S.C.section 360bbb-3(b)(1), unless the authorization is terminated  or revoked sooner.       Influenza A by PCR NEGATIVE NEGATIVE Final   Influenza B by PCR NEGATIVE NEGATIVE Final    Comment: (NOTE) The Xpert Xpress SARS-CoV-2/FLU/RSV plus assay is intended as an aid in the diagnosis of influenza from Nasopharyngeal swab specimens and should not be used as a sole basis for treatment. Nasal washings and aspirates are unacceptable for Xpert Xpress SARS-CoV-2/FLU/RSV testing.  Fact Sheet for Patients: BloggerCourse.com  Fact Sheet for Healthcare Providers: SeriousBroker.it  This test is not yet approved or cleared by the [place] FDA and has been authorized for detection and/or diagnosis of SARS-CoV-2 by FDA under an Emergency Use Authorization (EUA). This EUA will remain in effect (meaning this test can be used) for the duration of the COVID-19 declaration under Section 564(b)(1) of the Act, 21 U.S.C. section 360bbb-3(b)(1), unless the authorization is terminated or revoked.     Resp Syncytial Virus by PCR NEGATIVE NEGATIVE Final    Comment: (NOTE) Fact Sheet for Patients: BloggerCourse.com  Fact Sheet for Healthcare Providers: SeriousBroker.it  This test is not yet approved or cleared by the [place] FDA and has been authorized for detection and/or diagnosis of SARS-CoV-2 by FDA under an Emergency Use Authorization (EUA). This EUA will remain in effect (meaning this test can be used) for the duration of the COVID-19 declaration under Section 564(b)(1) of the Act, 21  U.S.C. section 360bbb-3(b)(1), unless the authorization is terminated or revoked.  Performed at [facility], [address], [place], [place] [address]   Culture, blood (Routine x 2)     Status: None   Collection Time: 09/06/22 12:25 AM   Specimen: BLOOD  Result Value Ref Range Status   Specimen Description   Final    BLOOD SITE NOT SPECIFIED Performed at [facility], [address] [place], [place] [address]    Special Requests   Final    BOTTLES DRAWN AEROBIC AND ANAEROBIC Blood Culture adequate volume Performed at [facility], [address] [place], [place] [address]    Culture   Final    NO GROWTH 5 DAYS Performed at [facility], [address], [place], [place] [address]    Report Status 09/11/2022 FINAL  Final  MRSA Next Gen by PCR, Nasal     Status: Abnormal   Collection Time: 09/09/22  7:54 AM   Specimen: Nasal Mucosa; Nasal Swab  Result Value Ref Range Status   MRSA by PCR Next Gen DETECTED (A) NOT DETECTED Final    Comment: (NOTE) The GeneXpert MRSA Assay (FDA approved for NASAL specimens only), is one component of a comprehensive MRSA colonization surveillance program. It is not intended to diagnose MRSA infection nor to guide or monitor treatment for MRSA infections. Test performance is not FDA approved in patients less than 17 years old. Performed at [facility], [address], [place], [place] [address]      Medications:    amLODipine  10 mg Oral Daily   ARIPiprazole  2  mg Oral Daily   Chlorhexidine Gluconate Cloth  6 each Topical Q0600   insulin aspart  0-5 Units Subcutaneous QHS   insulin aspart  0-9 Units Subcutaneous TID WC   insulin glargine-yfgn  10 Units Subcutaneous Daily   ipratropium-albuterol  3 mL Nebulization BID   losartan  50 mg Oral Daily   metoprolol tartrate  25 mg Oral BID   mupirocin ointment  1 Application Nasal BID   polyethylene glycol   17 g Oral Daily   rivaroxaban  20 mg Oral Q supper   sodium chloride flush  3 mL Intravenous Q12H   Continuous Infusions:    LOS: 8 days   Marinda Elk  [facility]  09/14/2022, 10:22 AM

## 2022-09-14 NOTE — TOC Progression Note (Addendum)
Transition of Care [facility]) - Progression Note    Patient Details  Name: Bradley Mcclure MRN: [id number] Date of Birth: [birth year]  Transition of Care [facility]) CM/SW Contact  Mkenzie Dotts, Olegario Messier, RN Phone Number: 09/14/2022, 1:45 PM  Clinical Narrative: Faxed with confirmation to [facility] screening checklist for [facility] SNF-await [facility] outcome. Patients [facility] receives [place] services from [facility]-DR. Basrai,CSW Katelyn [phone].       Barriers to Discharge: Continued Medical Work up  Expected Discharge Plan and Services     Post Acute Care Choice: Skilled Nursing Facility Living arrangements for the past 2 months: Single Family Home                                       Social Determinants of Health (SDOH) Interventions SDOH Screenings   Food Insecurity: No Food Insecurity (09/06/2022)  Housing: Low Risk  (09/06/2022)  Transportation Needs: No Transportation Needs (09/06/2022)  Utilities: Not At Risk (09/06/2022)  Tobacco Use: Low Risk  (09/06/2022)    Readmission Risk Interventions     No data to display

## 2022-09-15 DIAGNOSIS — A419 Sepsis, unspecified organism: Principal | ICD-10-CM

## 2022-09-15 DIAGNOSIS — E1110 Type 2 diabetes mellitus with ketoacidosis without coma: Secondary | ICD-10-CM

## 2022-09-15 DIAGNOSIS — R6520 Severe sepsis without septic shock: Secondary | ICD-10-CM

## 2022-09-15 LAB — BASIC METABOLIC PANEL
Anion gap: 8 (ref 5–15)
BUN: 14 mg/dL (ref 6–20)
CO2: 32 mmol/L (ref 22–32)
Calcium: 8.9 mg/dL (ref 8.9–10.3)
Chloride: 97 mmol/L — ABNORMAL LOW (ref 98–111)
Creatinine, Ser: 0.48 mg/dL — ABNORMAL LOW (ref 0.61–1.24)
GFR, Estimated: >60 mL/min (ref >60)
Glucose, Bld: 177 mg/dL — ABNORMAL HIGH (ref 70–99)
Potassium: 3.4 mmol/L — ABNORMAL LOW (ref 3.5–5.1)
Sodium: 137 mmol/L (ref 135–145)

## 2022-09-15 LAB — GLUCOSE, CAPILLARY
Glucose-Capillary: 175 mg/dL — ABNORMAL HIGH (ref 70–99)
Glucose-Capillary: 227 mg/dL — ABNORMAL HIGH (ref 70–99)
Glucose-Capillary: 139 mg/dL — ABNORMAL HIGH (ref 70–99)
Glucose-Capillary: 168 mg/dL — ABNORMAL HIGH (ref 70–99)

## 2022-09-15 MED ORDER — POTASSIUM CHLORIDE CRYS ER 20 MEQ PO TBCR
40.0000 meq | EXTENDED_RELEASE_TABLET | Freq: Two times a day (BID) | ORAL | Status: AC
  Administered 2022-09-15 (×2): 40 meq via ORAL
  Filled 2022-09-15 (×2): qty 2

## 2022-09-15 MED ORDER — INSULIN GLARGINE 100 UNIT/ML ~~LOC~~ SOLN: 20.0000 [IU] | Freq: Two times a day (BID) | SUBCUTANEOUS | 11 refills | Status: DC

## 2022-09-15 MED ORDER — METOPROLOL TARTRATE 25 MG PO TABS: 25.0000 mg | ORAL_TABLET | Freq: Two times a day (BID) | ORAL | Status: DC

## 2022-09-15 NOTE — Discharge Summary (Addendum)
Physician Discharge Summary  OVED PALMBERG NWG:[id number] DOB: [birth year] DOA: 09/05/2022  PCP: [facility]  Admit date: 09/05/2022 Discharge date: 09/15/2022  Admitted From: SNF Disposition:  SNF  Recommendations for Outpatient Follow-up:  Follow up with PCP in 1-2 weeks Please obtain BMP/CBC in one week   Home Health:No Equipment/Devices:None  Discharge Condition:Stable CODE STATUS:fULL Diet recommendation: Heart Healthy   Brief/Interim Summary: 62 y.o. male past medical history of MRSA bacteremia, Zaidi, depression, essential hypertension hyperlipidemia nonfatty liver disease, prior history of PE on chronic anticoagulation comes into the hospital for several days of upper respiratory symptoms cough and congestion, hospital course was complicated by metabolic acidosis which turned out to be euglycemic DKA moved to stepdown and started on an insulin drip for 2 days now acidosis has resolved.   Discharge Diagnoses:  Principal Problem:   Multifocal pneumonia Active Problems:   Hyperlipidemia   Major depressive disorder   Pulmonary embolism (HCC)   Essential hypertension   Type 2 diabetes mellitus (HCC)   GERD (gastroesophageal reflux disease)  Sepsis secondary to multifocal pneumonia: Started empirically on IV Rocephin and azithromycin due to work of breathing and DKA transferred to the ICU. Antibiotics were broadened to vancomycin and cefepime he completed 8-day course. Physical therapy evaluated the patient, he will need to go back to skilled nursing facility.  Euglycemic DKA/diabetes mellitus type 2: Oral hypoglycemic agents were held admission was started on IV insulin then transition to long-acting insulin plus sliding scale. He will continue his metformin and Lantus as an outpatient.  Hypokalemia: It was repleted orally.  History of PE: Seen and Xarelto.  Essential hypertension: Continue Losartan. New metoprolol.  Thrombocytopenia: Likely due  to infection now resolved.  History of depression: Continue Abilify.  Left buttock stage I pressure ulcer present on admission:  Discharge Instructions  Discharge Instructions     Diet - low sodium heart healthy   Complete by: As directed    Increase activity slowly   Complete by: As directed    No wound care   Complete by: As directed       Allergies as of 09/15/2022   No Known Allergies      Medication List     STOP taking these medications    empagliflozin 25 MG Tabs tablet Commonly known as: JARDIANCE   HYDROcodone-acetaminophen 5-325 MG tablet Commonly known as: NORCO/VICODIN   insulin aspart 100 UNIT/ML injection Commonly known as: novoLOG   insulin glargine 100 UNIT/ML injection Commonly known as: LANTUS   [employer] 10"x144" Shee   senna 8.6 MG Tabs tablet Commonly known as: SENOKOT   silver sulfADIAZINE 1 % cream Commonly known as: SILVADENE   sulfamethoxazole-trimethoprim 800-160 MG tablet Commonly known as: BACTRIM DS       TAKE these medications    acetaminophen 325 MG tablet Commonly known as: TYLENOL Take 650 mg by mouth every 6 (six) hours as needed for mild pain.   ARIPiprazole 2 MG tablet Commonly known as: ABILIFY Take 2 mg by mouth daily.   FLUoxetine 20 MG capsule Commonly known as: PROZAC Take 20 mg by mouth 2 (two) times daily as needed (anxiety/depression).   losartan 50 MG tablet Commonly known as: COZAAR Take 50 mg by mouth daily.   metFORMIN 500 MG tablet Commonly known as: GLUCOPHAGE Take 1,000 mg by mouth 2 (two) times daily with a meal.   metoprolol tartrate 25 MG tablet Commonly known as: LOPRESSOR Take 1 tablet (25 mg total) by mouth 2 (two)  times daily.   mupirocin ointment 2 % Commonly known as: BACTROBAN Place 1 application into the nose 2 (two) times daily.   polyethylene glycol 17 g packet Commonly known as: MIRALAX / GLYCOLAX Take 17 g by mouth daily.   rivaroxaban 20 MG Tabs  tablet Commonly known as: XARELTO Take 20 mg by mouth daily with supper.   Semaglutide (2 MG/DOSE) 8 MG/3ML Sopn Inject 2 mg into the skin every 7 (seven) days.        No Known Allergies  Consultations: No complaints   Procedures/Studies: CT CHEST WO CONTRAST  Result Date: 09/10/2022 CLINICAL DATA:  Pneumonia, complications suspected. EXAM: CT CHEST WITHOUT CONTRAST TECHNIQUE: Multidetector CT imaging of the chest was performed following the standard protocol without IV contrast. RADIATION DOSE REDUCTION: This exam was performed according to the departmental dose-optimization program which includes automated exposure control, adjustment of the mA and/or kV according to patient size and/or use of iterative reconstruction technique. COMPARISON:  Sep 06, 2022 FINDINGS: Cardiovascular: Calcific atherosclerotic disease of the coronary arteries. Normal heart size. No pericardial effusion. Minimal calcific atherosclerotic disease of the aorta. Mediastinum/Nodes: Mildly enlarged mediastinal lymph nodes the largest measuring 1 cm in short axis, likely reactive. Lungs/Pleura: Worsening of bilateral airspace consolidation with confluent consolidation in the right upper lobe and areas of patchy consolidation throughout all of the areas of the lungs. Interval development of small right pleural effusion and tiny left pleural effusion. Upper Abdomen: No acute abnormality. Musculoskeletal: No chest wall mass or suspicious bone lesions identified. IMPRESSION: 1. Worsening of bilateral airspace consolidation with confluent consolidation in the right upper lobe and areas of patchy consolidation throughout all of the bilateral lung parenchyma. 2. Interval development of small right pleural effusion and tiny left pleural effusion. 3. Mildly enlarged mediastinal lymph nodes, likely reactive. 4. Calcific atherosclerotic disease of the coronary arteries. 5. Aortic atherosclerosis. Aortic Atherosclerosis (ICD10-I70.0).  Electronically Signed   By: Ted Mcalpine M.D.   On: 09/10/2022 20:23   DG Chest Port 1 View  Result Date: 09/08/2022 CLINICAL DATA:  Shortness of breath. EXAM: PORTABLE CHEST 1 VIEW COMPARISON:  09/05/2022, 09/06/2022. FINDINGS: Heart is enlarged and the mediastinal contour stable. The pulmonary vasculature is distended. Patchy airspace disease is noted in the mid to lower left lung. There is consolidation in the right upper lobe. No effusion or pneumothorax. No acute osseous abnormality. IMPRESSION: 1. Cardiomegaly with mildly distended pulmonary vasculature. 2. Increased right upper lobe consolidation with patchy airspace disease in the mid to lower left lung field, suspicious for pneumonia. Electronically Signed   By: Thornell Sartorius M.D.   On: 09/08/2022 23:08   ECHOCARDIOGRAM COMPLETE  Result Date: 09/07/2022    ECHOCARDIOGRAM REPORT   Patient Name:   LYNNE VALLAS Date of Exam: 09/07/2022 Medical Rec #:  [id number]          Height:       73.0 in Accession #:    [id number]         Weight:       229.3 lb Date of Birth:  [birth year]          BSA:          2.280 m Patient Age:    61 years           BP:           137/74 mmHg Patient Gender: M                  HR:  100 bpm. Exam Location:  Inpatient Procedure: 2D Echo, Color Doppler, Cardiac Doppler and Intracardiac            Opacification Agent Indications:    Syncope, Pulmonary Embolus  History:        Patient has prior history of Echocardiogram examinations, most                 recent 11/17/2015. Pulmonary Embolus, Signs/Symptoms:Syncope; Risk                 Factors:Dyslipidemia, Diabetes and Hypertension.  Sonographer:    Milbert Coulter Referring Phys: [id number] DAVID MANUEL ORTIZ  Sonographer Comments: Suboptimal apical window and no subcostal window. IMPRESSIONS  1. Left ventricular ejection fraction, by estimation, is 60 to 65%. The left ventricle has normal function. The left ventricle has no regional wall motion abnormalities. There is  moderate left ventricular hypertrophy. Left ventricular diastolic parameters are indeterminate.  2. Right ventricular systolic function is normal. The right ventricular size is normal.  3. The mitral valve is normal in structure. No evidence of mitral valve regurgitation. No evidence of mitral stenosis.  4. The aortic valve is normal in structure. Aortic valve regurgitation is not visualized. No aortic stenosis is present.  5. The inferior vena cava is normal in size with greater than 50% respiratory variability, suggesting right atrial pressure of 3 mmHg. FINDINGS  Left Ventricle: Left ventricular ejection fraction, by estimation, is 60 to 65%. The left ventricle has normal function. The left ventricle has no regional wall motion abnormalities. Definity contrast agent was given IV to delineate the left ventricular  endocardial borders. The left ventricular internal cavity size was normal in size. There is moderate left ventricular hypertrophy. Left ventricular diastolic parameters are indeterminate. Right Ventricle: The right ventricular size is normal. No increase in right ventricular wall thickness. Right ventricular systolic function is normal. Left Atrium: Left atrial size was normal in size. Right Atrium: Right atrial size was normal in size. Pericardium: There is no evidence of pericardial effusion. Mitral Valve: The mitral valve is normal in structure. No evidence of mitral valve regurgitation. No evidence of mitral valve stenosis. Tricuspid Valve: The tricuspid valve is normal in structure. Tricuspid valve regurgitation is not demonstrated. No evidence of tricuspid stenosis. Aortic Valve: The aortic valve is normal in structure. Aortic valve regurgitation is not visualized. No aortic stenosis is present. Aortic valve mean gradient measures 4.0 mmHg. Aortic valve peak gradient measures 6.8 mmHg. Aortic valve area, by VTI measures 3.36 cm. Pulmonic Valve: The pulmonic valve was normal in structure. Pulmonic  valve regurgitation is not visualized. No evidence of pulmonic stenosis. Aorta: The aortic root is normal in size and structure. Venous: The inferior vena cava is normal in size with greater than 50% respiratory variability, suggesting right atrial pressure of 3 mmHg. IAS/Shunts: No atrial level shunt detected by color flow Doppler.  LEFT VENTRICLE PLAX 2D LVIDd:         4.30 cm   Diastology LVIDs:         2.70 cm   LV e' medial:    8.59 cm/s LV PW:         1.40 cm   LV E/e' medial:  10.5 LV IVS:        1.30 cm   LV e' lateral:   11.10 cm/s LVOT diam:     2.50 cm   LV E/e' lateral: 8.1 LV SV:         69 LV SV Index:  30 LVOT Area:     4.91 cm  RIGHT VENTRICLE RV S prime:     15.90 cm/s TAPSE (M-mode): 2.4 cm LEFT ATRIUM         Index LA diam:    4.00 cm 1.75 cm/m  AORTIC VALVE AV Area (Vmax):    3.33 cm AV Area (Vmean):   3.24 cm AV Area (VTI):     3.36 cm AV Vmax:           130.00 cm/s AV Vmean:          86.900 cm/s AV VTI:            0.206 m AV Peak Grad:      6.8 mmHg AV Mean Grad:      4.0 mmHg LVOT Vmax:         88.30 cm/s LVOT Vmean:        57.400 cm/s LVOT VTI:          0.141 m LVOT/AV VTI ratio: 0.68  AORTA Ao Root diam: 3.50 cm Ao Asc diam:  3.50 cm MITRAL VALVE MV Area (PHT): 4.41 cm    SHUNTS MV Decel Time: 172 msec    Systemic VTI:  0.14 m MV E velocity: 90.40 cm/s  Systemic Diam: 2.50 cm MV A velocity: 83.10 cm/s MV E/A ratio:  1.09 Donato Schultz MD Electronically signed by Donato Schultz MD Signature Date/Time: 09/07/2022/10:36:58 AM    Final    CT Angio Chest PE W/Cm &/Or Wo Cm  Result Date: 09/06/2022 CLINICAL DATA:  Pulmonary embolism (PE) suspected, high prob, syncope, tachycardia EXAM: CT ANGIOGRAPHY CHEST WITH CONTRAST TECHNIQUE: Multidetector CT imaging of the chest was performed using the standard protocol during bolus administration of intravenous contrast. Multiplanar CT image reconstructions and MIPs were obtained to evaluate the vascular anatomy. RADIATION DOSE REDUCTION: This exam was  performed according to the departmental dose-optimization program which includes automated exposure control, adjustment of the mA and/or kV according to patient size and/or use of iterative reconstruction technique. CONTRAST:  75mL OMNIPAQUE IOHEXOL 350 MG/ML SOLN COMPARISON:  None Available. FINDINGS: Cardiovascular: There is adequate opacification the pulmonary arterial tree. No intraluminal filling defect identified to suggest acute pulmonary embolism. The central pulmonary arteries are mildly enlarged in keeping with changes of pulmonary arterial hypertension. Moderate atherosclerotic calcification within the left anterior descending coronary artery. Global cardiac size within normal limits. No pericardial effusion. Minimal atherosclerotic calcification within the thoracic aorta. No aortic aneurysm. Mediastinum/Nodes: Shotty mediastinal and bilateral hilar adenopathy is nonspecific, likely reactive in nature. No frankly pathologic thoracic adenopathy identified. The esophagus is unremarkable. Lungs/Pleura: Multifocal airspace infiltrate is seen predominantly within the posterior right upper lobe but also scattered within the left lower lobe and lingula most in keeping with multifocal pneumonia in the acute setting. Associated bronchial wall thickening is present in keeping with airway inflammation. No pneumothorax or pleural effusion. No central obstructing lesion. Upper Abdomen: No acute abnormality. Musculoskeletal: No acute bone abnormality. No lytic or blastic bone lesion. Review of the MIP images confirms the above findings. IMPRESSION: 1. No pulmonary embolism. 2. Multifocal airspace infiltrate most in keeping with multifocal pneumonia in the acute setting. Associated bronchial wall thickening in keeping with airway inflammation. 3. Moderate coronary artery calcification. 4. Morphologic changes in keeping with pulmonary arterial hypertension. 5.  Aortic Atherosclerosis (ICD10-I70.0). Electronically Signed    By: Helyn Numbers M.D.   On: 09/06/2022 01:32   CT Head Wo Contrast  Result Date: 09/06/2022 CLINICAL DATA:  Trauma.  EXAM: CT HEAD WITHOUT CONTRAST CT CERVICAL SPINE WITHOUT CONTRAST TECHNIQUE: Multidetector CT imaging of the head and cervical spine was performed following the standard protocol without intravenous contrast. Multiplanar CT image reconstructions of the cervical spine were also generated. RADIATION DOSE REDUCTION: This exam was performed according to the departmental dose-optimization program which includes automated exposure control, adjustment of the mA and/or kV according to patient size and/or use of iterative reconstruction technique. COMPARISON:  None Available. FINDINGS: CT HEAD FINDINGS Brain: Mild age-related atrophy and chronic microvascular ischemic changes. There is no acute intracranial hemorrhage. No mass effect or midline shift. No extra-axial fluid collection. Vascular: No hyperdense vessel or unexpected calcification. Skull: Normal. Negative for fracture or focal lesion. Sinuses/Orbits: Mild mucoperiosteal thickening of paranasal sinuses. No air-fluid level. The mastoid air cells are clear. Right scleral banding. Other: None CT CERVICAL SPINE FINDINGS Alignment: No acute subluxation. Skull base and vertebrae: No acute fracture. Soft tissues and spinal canal: No prevertebral fluid or swelling. No visible canal hematoma. Disc levels:  No acute findings.  Degenerative changes. Upper chest: Negative. Other: Bilateral carotid bulb calcified plaques. IMPRESSION: 1. No acute intracranial pathology. Mild age-related atrophy and chronic microvascular ischemic changes. 2. No acute/traumatic cervical spine pathology. Electronically Signed   By: Elgie Collard M.D.   On: 09/06/2022 01:30   CT Cervical Spine Wo Contrast  Result Date: 09/06/2022 CLINICAL DATA:  Trauma. EXAM: CT HEAD WITHOUT CONTRAST CT CERVICAL SPINE WITHOUT CONTRAST TECHNIQUE: Multidetector CT imaging of the head and  cervical spine was performed following the standard protocol without intravenous contrast. Multiplanar CT image reconstructions of the cervical spine were also generated. RADIATION DOSE REDUCTION: This exam was performed according to the departmental dose-optimization program which includes automated exposure control, adjustment of the mA and/or kV according to patient size and/or use of iterative reconstruction technique. COMPARISON:  None Available. FINDINGS: CT HEAD FINDINGS Brain: Mild age-related atrophy and chronic microvascular ischemic changes. There is no acute intracranial hemorrhage. No mass effect or midline shift. No extra-axial fluid collection. Vascular: No hyperdense vessel or unexpected calcification. Skull: Normal. Negative for fracture or focal lesion. Sinuses/Orbits: Mild mucoperiosteal thickening of paranasal sinuses. No air-fluid level. The mastoid air cells are clear. Right scleral banding. Other: None CT CERVICAL SPINE FINDINGS Alignment: No acute subluxation. Skull base and vertebrae: No acute fracture. Soft tissues and spinal canal: No prevertebral fluid or swelling. No visible canal hematoma. Disc levels:  No acute findings.  Degenerative changes. Upper chest: Negative. Other: Bilateral carotid bulb calcified plaques. IMPRESSION: 1. No acute intracranial pathology. Mild age-related atrophy and chronic microvascular ischemic changes. 2. No acute/traumatic cervical spine pathology. Electronically Signed   By: Elgie Collard M.D.   On: 09/06/2022 01:30   DG Chest 2 View  Result Date: 09/05/2022 CLINICAL DATA:  Weakness, confusion, sore throat, fever EXAM: CHEST - 2 VIEW COMPARISON:  Chest radiograph 11/17/2015 FINDINGS: Low lung volumes accentuate cardiomediastinal silhouette and pulmonary vascularity. Bilateral interstitial coarsening likely due to low lung volumes. There is focal hazy opacity in the right lower lobe consistent with pneumonia. No pleural effusion or pneumothorax. No  displaced rib fractures. IMPRESSION: Right lower lobe pneumonia. Electronically Signed   By: Minerva Fester M.D.   On: 09/05/2022 23:50     Subjective: No complaints  Discharge Exam: Vitals:   09/15/22 0524 09/15/22 0747  BP: 131/72   Pulse: 83   Resp: (!) 23   Temp: 97.8 F (36.6 C)   SpO2: 93% 94%   Vitals:   09/14/22  2057 09/15/22 0500 09/15/22 0524 09/15/22 0747  BP: 129/70 131/71 131/72   Pulse: 87  83   Resp: (!) 21  (!) 23   Temp: 97.7 F (36.5 C)  97.8 F (36.6 C)   TempSrc: Oral  Oral   SpO2: 96%  93% 94%  Weight:   96.5 kg   Height:        General: Pt is alert, awake, not in acute distress Cardiovascular: RRR, S1/S2 +, no rubs, no gallops Respiratory: CTA bilaterally, no wheezing, no rhonchi Abdominal: Soft, NT, ND, bowel sounds + Extremities: no edema, no cyanosis    The results of significant diagnostics from this hospitalization (including imaging, microbiology, ancillary and laboratory) are listed below for reference.     Microbiology: Recent Results (from the past 240 hour(s))  Culture, blood (Routine x 2)     Status: None   Collection Time: 09/05/22 11:15 PM   Specimen: BLOOD  Result Value Ref Range Status   Specimen Description   Final    BLOOD SITE NOT SPECIFIED Performed at [facility], [address], [place], [place] [address]    Special Requests   Final    BOTTLES DRAWN AEROBIC AND ANAEROBIC Blood Culture results may not be optimal due to an excessive volume of blood received in culture bottles Performed at [facility], [address], [place], [place] [address]    Culture   Final    NO GROWTH 5 DAYS Performed at [facility], [address] [place], [place] [address]    Report Status 09/11/2022 FINAL  Final  Resp panel by RT-PCR (RSV, Flu A&B, Covid) Anterior Nasal Swab     Status: None   Collection Time: 09/05/22 11:17 PM   Specimen: Anterior Nasal Swab  Result Value Ref Range Status    SARS Coronavirus 2 by RT PCR NEGATIVE NEGATIVE Final    Comment: (NOTE) SARS-CoV-2 target nucleic acids are NOT DETECTED.  The SARS-CoV-2 RNA is generally detectable in upper respiratory specimens during the acute phase of infection. The lowest concentration of SARS-CoV-2 viral copies this assay can detect is 138 copies/mL. A negative result does not preclude SARS-Cov-2 infection and should not be used as the sole basis for treatment or other patient management decisions. A negative result may occur with  improper specimen collection/handling, submission of specimen other than nasopharyngeal swab, presence of viral mutation(s) within the areas targeted by this assay, and inadequate number of viral copies(<138 copies/mL). A negative result must be combined with clinical observations, patient history, and epidemiological information. The expected result is Negative.  Fact Sheet for Patients:  BloggerCourse.com  Fact Sheet for Healthcare Providers:  SeriousBroker.it  This test is no t yet approved or cleared by the [place] FDA and  has been authorized for detection and/or diagnosis of SARS-CoV-2 by FDA under an Emergency Use Authorization (EUA). This EUA will remain  in effect (meaning this test can be used) for the duration of the COVID-19 declaration under Section 564(b)(1) of the Act, 21 U.S.C.section 360bbb-3(b)(1), unless the authorization is terminated  or revoked sooner.       Influenza A by PCR NEGATIVE NEGATIVE Final   Influenza B by PCR NEGATIVE NEGATIVE Final    Comment: (NOTE) The Xpert Xpress SARS-CoV-2/FLU/RSV plus assay is intended as an aid in the diagnosis of influenza from Nasopharyngeal swab specimens and should not be used as a sole basis for treatment. Nasal washings and aspirates are unacceptable for Xpert Xpress  SARS-CoV-2/FLU/RSV testing.  Fact Sheet for  Patients: BloggerCourse.com  Fact Sheet for Healthcare Providers: SeriousBroker.it  This test is not yet approved or cleared by the [place] FDA and has been authorized for detection and/or diagnosis of SARS-CoV-2 by FDA under an Emergency Use Authorization (EUA). This EUA will remain in effect (meaning this test can be used) for the duration of the COVID-19 declaration under Section 564(b)(1) of the Act, 21 U.S.C. section 360bbb-3(b)(1), unless the authorization is terminated or revoked.     Resp Syncytial Virus by PCR NEGATIVE NEGATIVE Final    Comment: (NOTE) Fact Sheet for Patients: BloggerCourse.com  Fact Sheet for Healthcare Providers: SeriousBroker.it  This test is not yet approved or cleared by the [place] FDA and has been authorized for detection and/or diagnosis of SARS-CoV-2 by FDA under an Emergency Use Authorization (EUA). This EUA will remain in effect (meaning this test can be used) for the duration of the COVID-19 declaration under Section 564(b)(1) of the Act, 21 U.S.C. section 360bbb-3(b)(1), unless the authorization is terminated or revoked.  Performed at [facility], [address] [place], [place] [address]   Culture, blood (Routine x 2)     Status: None   Collection Time: 09/06/22 12:25 AM   Specimen: BLOOD  Result Value Ref Range Status   Specimen Description   Final    BLOOD SITE NOT SPECIFIED Performed at [facility], [address], [place], [place] [address]    Special Requests   Final    BOTTLES DRAWN AEROBIC AND ANAEROBIC Blood Culture adequate volume Performed at [facility], [address], [place], [place] [address]    Culture   Final    NO GROWTH 5 DAYS Performed at [facility], [address] [place], [place] [address]    Report Status 09/11/2022 FINAL  Final   MRSA Next Gen by PCR, Nasal     Status: Abnormal   Collection Time: 09/09/22  7:54 AM   Specimen: Nasal Mucosa; Nasal Swab  Result Value Ref Range Status   MRSA by PCR Next Gen DETECTED (A) NOT DETECTED Final    Comment: (NOTE) The GeneXpert MRSA Assay (FDA approved for NASAL specimens only), is one component of a comprehensive MRSA colonization surveillance program. It is not intended to diagnose MRSA infection nor to guide or monitor treatment for MRSA infections. Test performance is not FDA approved in patients less than 40 years old. Performed at [facility], [address] [place], [place] [address]      Labs: BNP (last 3 results) Recent Labs    09/05/22 0059  BNP 61.7   Basic Metabolic Panel: Recent Labs  Lab 09/09/22 0054 09/09/22 1035 09/10/22 0201 09/10/22 1104 09/11/22 0300 09/11/22 1054 09/11/22 1758 09/12/22 0254 09/13/22 0258 09/14/22 0516 09/15/22 0515  NA 132*   < > 133*   < > 143   < > 143 144 137 137 137  K 4.3   < > 4.8   < > 3.6   < > 3.0* 3.0* 3.0* 3.0* 3.4*  CL 105   < > 110   < > 112*   < > 108 109 101 100 97*  CO2 12*   < > 10*   < > 20*   < > 20* 22 25 27  32  GLUCOSE 148*   < > 131*   < > 147*   < > 160* 154* 152* 167* 177*  BUN 19   < >  27*   < > 22*   < > 18 21* 21* 16 14  CREATININE 0.86   < > 0.72   < > 0.64   < > 0.62 0.63 0.44* 0.45* 0.48*  CALCIUM 9.7   < > 9.1   < > 9.6   < > 9.6 9.3 8.9 8.8* 8.9  MG 1.9  --  2.0  --  1.9  --   --  1.7 1.7 1.7  --   PHOS 2.9  --   --   --   --   --   --   --   --   --   --    < > = values in this interval not displayed.   Liver Function Tests: Recent Labs  Lab 09/11/22 0300 09/12/22 0254 09/13/22 0258 09/14/22 0516  AST 38 49* 109* 47*  ALT 26 38 86* 71*  ALKPHOS 64 67 70 64  BILITOT 1.1 1.5* 1.3* 1.1  PROT 6.8 7.0 7.0 6.9  ALBUMIN 2.6* 2.6* 2.5* 2.4*   No results for input(s): "LIPASE", "AMYLASE" in the last 168 hours. No results for input(s): "AMMONIA" in the last  168 hours. CBC: Recent Labs  Lab 09/10/22 0201 09/11/22 0300 09/12/22 0254 09/13/22 0258 09/14/22 0516  WBC 17.8* 12.8* 10.0 9.4 8.0  HGB 14.4 13.7 14.0 13.8 13.9  HCT 43.7 41.3 40.6 41.1 42.3  MCV 95.6 94.9 92.3 92.2 93.2  PLT 184 196 172 154 149*   Cardiac Enzymes: No results for input(s): "CKTOTAL", "CKMB", "CKMBINDEX", "TROPONINI" in the last 168 hours. BNP: Invalid input(s): "POCBNP" CBG: Recent Labs  Lab 09/14/22 0746 09/14/22 1230 09/14/22 1638 09/14/22 2120 09/15/22 0812  GLUCAP 162* 300* 111* 178* 175*   D-Dimer No results for input(s): "DDIMER" in the last 72 hours. Hgb A1c No results for input(s): "HGBA1C" in the last 72 hours. Lipid Profile No results for input(s): "CHOL", "HDL", "LDLCALC", "TRIG", "CHOLHDL", "LDLDIRECT" in the last 72 hours. Thyroid function studies No results for input(s): "TSH", "T4TOTAL", "T3FREE", "THYROIDAB" in the last 72 hours.  Invalid input(s): "FREET3" Anemia work up No results for input(s): "VITAMINB12", "FOLATE", "FERRITIN", "TIBC", "IRON", "RETICCTPCT" in the last 72 hours. Urinalysis    Component Value Date/Time   COLORURINE STRAW (A) 09/05/2022 2327   APPEARANCEUR CLEAR 09/05/2022 2327   LABSPEC 1.033 (H) 09/05/2022 2327   PHURINE 5.0 09/05/2022 2327   GLUCOSEU >=500 (A) 09/05/2022 2327   HGBUR MODERATE (A) 09/05/2022 2327   BILIRUBINUR NEGATIVE 09/05/2022 2327   KETONESUR 20 (A) 09/05/2022 2327   PROTEINUR 30 (A) 09/05/2022 2327   NITRITE NEGATIVE 09/05/2022 2327   LEUKOCYTESUR NEGATIVE 09/05/2022 2327   Sepsis Labs Recent Labs  Lab 09/11/22 0300 09/12/22 0254 09/13/22 0258 09/14/22 0516  WBC 12.8* 10.0 9.4 8.0   Microbiology Recent Results (from the past 240 hour(s))  Culture, blood (Routine x 2)     Status: None   Collection Time: 09/05/22 11:15 PM   Specimen: BLOOD  Result Value Ref Range Status   Specimen Description   Final    BLOOD SITE NOT SPECIFIED Performed at [facility], [address] [place], [place] [address]    Special Requests   Final    BOTTLES DRAWN AEROBIC AND ANAEROBIC Blood Culture results may not be optimal due to an excessive volume of blood received in culture bottles Performed at [facility], [address], [place], [place] [address]    Culture   Final  NO GROWTH 5 DAYS Performed at [facility], [address], [place], [place] [address]    Report Status 09/11/2022 FINAL  Final  Resp panel by RT-PCR (RSV, Flu A&B, Covid) Anterior Nasal Swab     Status: None   Collection Time: 09/05/22 11:17 PM   Specimen: Anterior Nasal Swab  Result Value Ref Range Status   SARS Coronavirus 2 by RT PCR NEGATIVE NEGATIVE Final    Comment: (NOTE) SARS-CoV-2 target nucleic acids are NOT DETECTED.  The SARS-CoV-2 RNA is generally detectable in upper respiratory specimens during the acute phase of infection. The lowest concentration of SARS-CoV-2 viral copies this assay can detect is 138 copies/mL. A negative result does not preclude SARS-Cov-2 infection and should not be used as the sole basis for treatment or other patient management decisions. A negative result may occur with  improper specimen collection/handling, submission of specimen other than nasopharyngeal swab, presence of viral mutation(s) within the areas targeted by this assay, and inadequate number of viral copies(<138 copies/mL). A negative result must be combined with clinical observations, patient history, and epidemiological information. The expected result is Negative.  Fact Sheet for Patients:  BloggerCourse.com  Fact Sheet for Healthcare Providers:  SeriousBroker.it  This test is no t yet approved or cleared by the [place] FDA and  has been authorized for detection and/or diagnosis of SARS-CoV-2 by FDA under an Emergency Use Authorization (EUA). This EUA will remain  in effect  (meaning this test can be used) for the duration of the COVID-19 declaration under Section 564(b)(1) of the Act, 21 U.S.C.section 360bbb-3(b)(1), unless the authorization is terminated  or revoked sooner.       Influenza A by PCR NEGATIVE NEGATIVE Final   Influenza B by PCR NEGATIVE NEGATIVE Final    Comment: (NOTE) The Xpert Xpress SARS-CoV-2/FLU/RSV plus assay is intended as an aid in the diagnosis of influenza from Nasopharyngeal swab specimens and should not be used as a sole basis for treatment. Nasal washings and aspirates are unacceptable for Xpert Xpress SARS-CoV-2/FLU/RSV testing.  Fact Sheet for Patients: BloggerCourse.com  Fact Sheet for Healthcare Providers: SeriousBroker.it  This test is not yet approved or cleared by the [place] FDA and has been authorized for detection and/or diagnosis of SARS-CoV-2 by FDA under an Emergency Use Authorization (EUA). This EUA will remain in effect (meaning this test can be used) for the duration of the COVID-19 declaration under Section 564(b)(1) of the Act, 21 U.S.C. section 360bbb-3(b)(1), unless the authorization is terminated or revoked.     Resp Syncytial Virus by PCR NEGATIVE NEGATIVE Final    Comment: (NOTE) Fact Sheet for Patients: BloggerCourse.com  Fact Sheet for Healthcare Providers: SeriousBroker.it  This test is not yet approved or cleared by the [place] FDA and has been authorized for detection and/or diagnosis of SARS-CoV-2 by FDA under an Emergency Use Authorization (EUA). This EUA will remain in effect (meaning this test can be used) for the duration of the COVID-19 declaration under Section 564(b)(1) of the Act, 21 U.S.C. section 360bbb-3(b)(1), unless the authorization is terminated or revoked.  Performed at [facility], [address] [place], [place] [address]   Culture,  blood (Routine x 2)     Status: None   Collection Time: 09/06/22 12:25 AM   Specimen: BLOOD  Result Value Ref Range Status   Specimen Description   Final    BLOOD SITE NOT SPECIFIED Performed at [facility], [address] [place],  [place] [address]    Special Requests   Final    BOTTLES DRAWN AEROBIC AND ANAEROBIC Blood Culture adequate volume Performed at [facility], [address], [place], [place] [address]    Culture   Final    NO GROWTH 5 DAYS Performed at [facility], [address] [place], [place] [address]    Report Status 09/11/2022 FINAL  Final  MRSA Next Gen by PCR, Nasal     Status: Abnormal   Collection Time: 09/09/22  7:54 AM   Specimen: Nasal Mucosa; Nasal Swab  Result Value Ref Range Status   MRSA by PCR Next Gen DETECTED (A) NOT DETECTED Final    Comment: (NOTE) The GeneXpert MRSA Assay (FDA approved for NASAL specimens only), is one component of a comprehensive MRSA colonization surveillance program. It is not intended to diagnose MRSA infection nor to guide or monitor treatment for MRSA infections. Test performance is not FDA approved in patients less than 62 years old. Performed at [facility], [address] [place], [place] [address]     SIGNED:   Marinda Elk, MD  [facility] 09/15/2022, 12:08 PM Pager   If 7PM-7AM, please contact night-coverage [email or web address] Password [email or web address]

## 2022-09-15 NOTE — Inpatient Diabetes Management (Signed)
Inpatient Diabetes Program Recommendations  AACE/ADA: New Consensus Statement on Inpatient Glycemic Control (2015)  Target Ranges:  Prepandial:   less than 140 mg/dL      Peak postprandial:   less than 180 mg/dL (1-2 hours)      Critically ill patients:  140 - 180 mg/dL   Lab Results  Component Value Date   GLUCAP 175 (H) 09/15/2022   HGBA1C 6.8 (H) 09/06/2022    Review of Glycemic Control  Latest Reference Range & Units 09/14/22 07:46 09/14/22 12:30 09/14/22 16:38 09/14/22 21:20 09/15/22 08:12  Glucose-Capillary 70 - 99 mg/dL 161 (H) 096 (H) 045 (H) 178 (H) 175 (H)  (H): Data is abnormally high Diabetes history: DM 2 Outpatient Diabetes medications:  Jardiance 25 mg daily                              Novolog 10-20 units BID                              Metformin 1000 mg BID                              Ozempic 2 mg qweek                              Lantus 75 units BID (NOT taking the Lantus per Home Med Rec)   Current orders for Inpatient glycemic control:  Novolog 0-9 units tid with meals and HS Novolog 3 units tid with meals Semglee 10 units daily  Inpatient Diabetes Program Recommendations:    Note plans for d/c to SNF today.  Patient has only been on Semglee 10 units in the hospital (home dose is listed as  Lantus 75 units bid)- May want to reduce discharge dose significantly to prevent hypoglycemia.   Thanks,  Beryl Meager, RN, BC-ADM Inpatient Diabetes Coordinator Pager [phone]  (8a-5p)

## 2022-09-15 NOTE — TOC Progression Note (Signed)
Transition of Care [facility]) - Progression Note    Patient Details  Name: Bradley Mcclure MRN: [id number] Date of Birth: 10-29-1960  Transition of Care [facility]) CM/SW Contact  Rheya Minogue, Olegario Messier, RN Phone Number: 09/15/2022, 12:57 PM  Clinical Narrative: Left vm w/[facility] rep [facility] Driver [phone] [id number] outcome of C[facility] center for ST SNF. Katelyn-left vm-awaiting outcome.        Barriers to Discharge: Continued Medical Work up  Expected Discharge Plan and Services     Post Acute Care Choice: Skilled Nursing Facility Living arrangements for the past 2 months: Single Family Home Expected Discharge Date: 09/15/22                                     Social Determinants of Health (SDOH) Interventions SDOH Screenings   Food Insecurity: No Food Insecurity (09/06/2022)  Housing: Low Risk  (09/06/2022)  Transportation Needs: No Transportation Needs (09/06/2022)  Utilities: Not At Risk (09/06/2022)  Tobacco Use: Low Risk  (09/06/2022)    Readmission Risk Interventions     No data to display

## 2022-09-15 NOTE — Progress Notes (Signed)
Physical Therapy Treatment Patient Details Name: Bradley Mcclure MRN: [id number] DOB: November 12, 1960 Today's Date: 09/15/2022   History of Present Illness 62 year old male with prior history of MRSA bacteremia, anxiety, depression, mild cognitive disorder, HTN, HLD, DM2, NAFLD, prior PE on chronic anticoagulation, obesity comes to the hospital with several days of URI type symptoms, cough, chest congestion and eventually passing out.  In the ED he was found to have right lower lobe pneumonia, he was started on antibiotics and admitted to the hospital.  Hospital course complicated by worsening metabolic acidosis which did not have a clear etiology initially, however it was found to be due to euglycemic DKA.  He was moved to stepdown, maintained on insulin drip for 2 days with improvement in his acidosis.  Currently on 4 east.    PT Comments    General Comments: cognition and level of alertness continue to improve.  Overall, pt feels "tired" and "weak".  Assisted OOB to amb was difficult.  General bed mobility comments: Required Mod Assist for upper body support to transition to EOB.  Slow.   Right lean.  General transfer comment: Required Mod Asisst rise from EOB.  Slow.  Poor forward flexed posture.  Unsteady.  General Gait Details: decreased amb distance this session due to incont BM in hallway.  "Sorry, I didn't know" stated pt.  Returned to room in recliner.  Pt lives home alone and was amb Indep/driving/working. Pt will need ST Rehab at SNF to address mobility and functional decline prior to safely returning home.   Recommendations for follow up therapy are one component of a multi-disciplinary discharge planning process, led by the attending physician.  Recommendations may be updated based on patient status, additional functional criteria and insurance authorization.  Follow Up Recommendations  Can patient physically be transported by private vehicle: No    Assistance Recommended at Discharge     Patient can return home with the following A lot of help with walking and/or transfers;A lot of help with bathing/dressing/bathroom;Assistance with cooking/housework;Assist for transportation;Help with stairs or ramp for entrance   Equipment Recommendations  Rolling walker (2 wheels)    Recommendations for Other Services       Precautions / Restrictions Precautions Precautions: Fall Precaution Comments: monitor sats Restrictions Weight Bearing Restrictions: No     Mobility  Bed Mobility Overal bed mobility: Needs Assistance Bed Mobility: Supine to Sit     Supine to sit: Mod assist     General bed mobility comments: Required Mod Assist for upper body support to transition to EOB.  Slow.   Right lean.    Transfers Overall transfer level: Needs assistance Equipment used: Rolling walker (2 wheels) Transfers: Sit to/from Stand Sit to Stand: Mod assist           General transfer comment: Required Mod Asisst rise from EOB.  Slow.  Poor forward flexed posture.  Unsteady.    Ambulation/Gait Ambulation/Gait assistance: Mod assist Gait Distance (Feet): 53 Feet Assistive device: Rolling walker (2 wheels) Gait Pattern/deviations: Step-through pattern, Decreased stride length, Drifts right/left, Knee flexed in stance - right, Knee flexed in stance - left, Shuffle Gait velocity: decreased     General Gait Details: decreased amb distance this session due to incont BM in hallway.  "Sorry, I didn't know" stated pt.  Returned to room in recliner.   Stairs             Wheelchair Mobility    Modified Rankin (Stroke Patients Only)  Balance                                            Cognition Arousal/Alertness: Awake/alert Behavior During Therapy: WFL for tasks assessed/performed Overall Cognitive Status: Impaired/Different from baseline Area of Impairment: Following commands, Problem solving                                General Comments: cognition and level of alertness continue to improve.  Overall, pt feels "tired" and "weak".        Exercises      General Comments        Pertinent Vitals/Pain Pain Assessment Pain Assessment: No/denies pain    Home Living                          Prior Function            PT Goals (current goals can now be found in the care plan section) Progress towards PT goals: Progressing toward goals    Frequency    Min 1X/week      PT Plan Current plan remains appropriate    Co-evaluation              AM-PAC PT "6 Clicks" Mobility   Outcome Measure  Help needed turning from your back to your side while in a flat bed without using bedrails?: A Lot Help needed moving from lying on your back to sitting on the side of a flat bed without using bedrails?: A Lot Help needed moving to and from a bed to a chair (including a wheelchair)?: A Lot Help needed standing up from a chair using your arms (e.g., wheelchair or bedside chair)?: A Lot Help needed to walk in hospital room?: A Lot Help needed climbing 3-5 steps with a railing? : Total 6 Click Score: 11    End of Session Equipment Utilized During Treatment: Gait belt Activity Tolerance: Patient limited by fatigue Patient left: in chair;with call bell/phone within reach;with chair alarm set Nurse Communication: Mobility status PT Visit Diagnosis: Unsteadiness on feet (R26.81);Muscle weakness (generalized) (M62.81);Other abnormalities of gait and mobility (R26.89);Difficulty in walking, not elsewhere classified (R26.2)     Time: 1610-9604 PT Time Calculation (min) (ACUTE ONLY): 40 min  Charges:  $Gait Training: 8-22 mins $Therapeutic Activity: 23-37 mins                     Felecia Shelling  PTA Acute  Rehabilitation Services Office M-F          [phone]

## 2022-09-16 LAB — GLUCOSE, CAPILLARY
Glucose-Capillary: 201 mg/dL — ABNORMAL HIGH (ref 70–99)
Glucose-Capillary: 215 mg/dL — ABNORMAL HIGH (ref 70–99)
Glucose-Capillary: 189 mg/dL — ABNORMAL HIGH (ref 70–99)

## 2022-09-16 MED ORDER — METFORMIN HCL 500 MG PO TABS
1000.0000 mg | ORAL_TABLET | Freq: Two times a day (BID) | ORAL | Status: DC
  Administered 2022-09-16 – 2022-09-19 (×7): 1000 mg via ORAL
  Filled 2022-09-16 (×7): qty 2

## 2022-09-16 NOTE — TOC PASRR Note (Signed)
30 Day PASRR Note   Patient Details  Name: Bradley Mcclure Date of Birth: Mar 20, 1961   [facility] CM/SW Contact:    Lanier Clam, RN Phone Number: 09/16/2022, 4:20 PM  To Whom It May Concern:  Please be advised that this patient will require a short-term nursing home stay - anticipated 30 days or less for rehabilitation and strengthening.   The plan is for return home.

## 2022-09-16 NOTE — Progress Notes (Signed)
Occupational Therapy Treatment Patient Details Name: Bradley Mcclure MRN: [id number] DOB: [birth year] Today's Date: 09/16/2022   History of present illness 62 year old male with prior history of MRSA bacteremia, anxiety, depression, mild cognitive disorder, HTN, HLD, DM2, NAFLD, prior PE on chronic anticoagulation, obesity comes to the hospital with several days of URI type symptoms, cough, chest congestion and eventually passing out.  In the ED he was found to have right lower lobe pneumonia, he was started on antibiotics and admitted to the hospital.  Hospital course complicated by worsening metabolic acidosis which did not have a clear etiology initially, however it was found to be due to euglycemic DKA.  He was moved to stepdown, maintained on insulin drip for 2 days with improvement in his acidosis.  Currently on 4 east.   OT comments  Pt alert and participating. Did not recall discharge plan or MD visit this morning. Not oriented to time, which is when he typically watches his favorite TV show. Mod assist for supine<>sit, rolled mod I with use of rail to position off buttocks for comfort. Stood with mod assist with flexed posture. Pt unable to release RW in standing for grooming at sink, sat for grooming at EOB. Continues to be appropriate for post acute rehab <3 hours a day.   Recommendations for follow up therapy are one component of a multi-disciplinary discharge planning process, led by the attending physician.  Recommendations may be updated based on patient status, additional functional criteria and insurance authorization.    Assistance Recommended at Discharge Frequent or constant Supervision/Assistance  Patient can return home with the following  A lot of help with walking and/or transfers;A lot of help with bathing/dressing/bathroom;Assistance with cooking/housework;Direct supervision/assist for medications management;Direct supervision/assist for financial management;Assist for  transportation;Help with stairs or ramp for entrance   Equipment Recommendations  None recommended by OT (defer to next venue)    Recommendations for Other Services      Precautions / Restrictions Precautions Precautions: Fall Precaution Comments: monitor sats Restrictions Weight Bearing Restrictions: No       Mobility Bed Mobility Overal bed mobility: Needs Assistance Bed Mobility: Supine to Sit, Sit to Supine, Rolling Rolling: Modified independent (Device/Increase time)   Supine to sit: Mod assist Sit to supine: Mod assist   General bed mobility comments: increased time, assist to raise trunk and for LEs back into bed, pt rolled rail and no assist to position on his side for comfort    Transfers Overall transfer level: Needs assistance Equipment used: Rolling walker (2 wheels) Transfers: Sit to/from Stand Sit to Stand: From elevated surface, Mod assist           General transfer comment: use of momentum, assist to correct posterior and R lean, pt appearing unaware, cues for upright posture     Balance Overall balance assessment: Needs assistance   Sitting balance-Leahy Scale: Fair       Standing balance-Leahy Scale: Poor Standing balance comment: B UE support and external assist                           ADL either performed or assessed with clinical judgement   ADL Overall ADL's : Needs assistance/impaired     Grooming: Oral care;Brushing hair;Sitting Grooming Details (indicate cue type and reason): increased time, set up         Upper Body Dressing : Minimal assistance;Sitting Upper Body Dressing Details (indicate cue type and reason): to change soiled gown  Extremity/Trunk Assessment              Vision       Perception     Praxis      Cognition Arousal/Alertness: Awake/alert Behavior During Therapy: WFL for tasks assessed/performed Overall Cognitive Status: Impaired/Different from  baseline Area of Impairment: Following commands, Problem solving, Memory                     Memory: Decreased short-term memory Following Commands: Follows one step commands with increased time     Problem Solving: Slow processing General Comments: improved level of alertness, cues to make needs known (time for his favorite TV show) and to use remote to locate channgel        Exercises      Shoulder Instructions       General Comments      Pertinent Vitals/ Pain       Pain Assessment Pain Assessment: No/denies pain  Home Living                                          Prior Functioning/Environment              Frequency  Min 2X/week        Progress Toward Goals  OT Goals(current goals can now be found in the care plan section)  Progress towards OT goals: Progressing toward goals  Acute Rehab OT Goals OT Goal Formulation: With patient Time For Goal Achievement: 09/27/22 Potential to Achieve Goals: Fair  Plan Discharge plan remains appropriate    Co-evaluation                 AM-PAC OT "6 Clicks" Daily Activity     Outcome Measure   Help from another person eating meals?: A Little Help from another person taking care of personal grooming?: A Little Help from another person toileting, which includes using toliet, bedpan, or urinal?: A Lot Help from another person bathing (including washing, rinsing, drying)?: A Lot Help from another person to put on and taking off regular upper body clothing?: A Little Help from another person to put on and taking off regular lower body clothing?: A Lot 6 Click Score: 15    End of Session Equipment Utilized During Treatment: Gait belt;Rolling walker (2 wheels);Oxygen  OT Visit Diagnosis: Unsteadiness on feet (R26.81);Other abnormalities of gait and mobility (R26.89);History of falling (Z91.81)   Activity Tolerance Patient tolerated treatment well   Patient Left in bed;with call  bell/phone within reach   Nurse Communication          Time: 1610-9604 OT Time Calculation (min): 13 min  Charges: OT General Charges $OT Visit: 1 Visit OT Treatments $Self Care/Home Management : 8-22 mins  Berna Spare, OTR/L Acute Rehabilitation Services Office: [phone]  Evern Bio 09/16/2022, 2:38 PM

## 2022-09-16 NOTE — TOC Progression Note (Addendum)
Transition of Care [facility]) - Progression Note    Patient Details  Name: Bradley Mcclure MRN: [id number] Date of Birth: 07/22/1960  Transition of Care [facility]) CM/SW Contact  Laparis Durrett, Olegario Messier, RN Phone Number: 09/16/2022, 10:27 AM  Clinical Narrative: Left vm again for [place] rep for [facility] screening checklist ,also left vm for Frazier Richards CSW for patient. Awaiting response on auth for ST SNF, & available facilities.MD updated.   -10:34a Left vm w/Transfer coordinator April Alexander to asst in any way with f/u on [facility] SN[facility]. -10:53a-received call back from [place], & April that case will be reviewed today;I should have a response today by 2p or later. -4:22p-Received call back from [facility] rep Shakia-patient is not service connected;no VA benefit for [facility] SNF;Laureen Abrahams provided medicare a & B [id number]. Fl2 done, await pasrr.    Barriers to Discharge: Continued Medical Work up  Expected Discharge Plan and Services     Post Acute Care Choice: Skilled Nursing Facility Living arrangements for the past 2 months: Single Family Home Expected Discharge Date: 09/15/22                                     Social Determinants of Health (SDOH) Interventions SDOH Screenings   Food Insecurity: No Food Insecurity (09/06/2022)  Housing: Low Risk  (09/06/2022)  Transportation Needs: No Transportation Needs (09/06/2022)  Utilities: Not At Risk (09/06/2022)  Tobacco Use: Low Risk  (09/06/2022)    Readmission Risk Interventions     No data to display

## 2022-09-16 NOTE — Progress Notes (Signed)
PHYSICAL THERAPY  SATURATION QUALIFICATIONS: (This note is used to comply with regulatory documentation for home oxygen)  Patient Saturations on Room Air at Rest = 90%  Patient Saturations on Room Air while Ambulating 42 feet= 85%  Patient Saturations on 2 Liters of oxygen while Ambulating = 90%  Please briefly explain why patient needs home oxygen:  Pt requires supplemental oxygen to achieve therapeutic levels.  Felecia Shelling  PTA Acute  Rehabilitation Services Office M-F          [phone]

## 2022-09-16 NOTE — Discharge Summary (Signed)
Physician Discharge Summary  Bradley Mcclure:[id number] DOB: 1960-06-18 DOA: 09/05/2022  PCP: [facility]  Admit date: 09/05/2022 Discharge date: 09/16/2022  Admitted From: SNF Disposition:  SNF  Recommendations for Outpatient Follow-up:  Follow up with PCP in 1-2 weeks Please obtain BMP/CBC in one week   Home Health:No Equipment/Devices:None  Discharge Condition:Stable CODE STATUS:fULL Diet recommendation: Heart Healthy   Brief/Interim Summary: 62 y.o. male past medical history of MRSA bacteremia, Zaidi, depression, essential hypertension hyperlipidemia nonfatty liver disease, prior history of PE on chronic anticoagulation comes into the hospital for several days of upper respiratory symptoms cough and congestion, hospital course was complicated by metabolic acidosis which turned out to be euglycemic DKA moved to stepdown and started on an insulin drip for 2 days now acidosis has resolved.   Discharge Diagnoses:  Principal Problem:   Multifocal pneumonia Active Problems:   Hyperlipidemia   Major depressive disorder   Pulmonary embolism (HCC)   Essential hypertension   Type 2 diabetes mellitus (HCC)   GERD (gastroesophageal reflux disease)  Sepsis secondary to multifocal pneumonia: Started empirically on IV Rocephin and azithromycin due to work of breathing and DKA transferred to the ICU. Antibiotics were broadened to vancomycin and cefepime he completed 8-day course. Physical therapy evaluated the patient, he will need to go back to skilled nursing facility. Awaiting skilled nursing facility placement.  Euglycemic DKA/diabetes mellitus type 2: Oral hypoglycemic agents were held admission was started on IV insulin then transition to long-acting insulin plus sliding scale. He will continue his metformin and Lantus as an outpatient.  Hypokalemia: It was repleted orally.  History of PE: Seen and Xarelto.  Essential hypertension: Continue metoprolol and  losartan.  Thrombocytopenia: Likely due to infection now resolved.  History of depression: Continue Abilify.  Left buttock stage I pressure ulcer present on admission:  Discharge Instructions  Discharge Instructions     Diet - low sodium heart healthy   Complete by: As directed    Increase activity slowly   Complete by: As directed    No wound care   Complete by: As directed       Allergies as of 09/16/2022   No Known Allergies      Medication List     STOP taking these medications    empagliflozin 25 MG Tabs tablet Commonly known as: JARDIANCE   HYDROcodone-acetaminophen 5-325 MG tablet Commonly known as: NORCO/VICODIN   insulin aspart 100 UNIT/ML injection Commonly known as: novoLOG   insulin glargine 100 UNIT/ML injection Commonly known as: LANTUS   [employer] 10"x144" Shee   senna 8.6 MG Tabs tablet Commonly known as: SENOKOT   silver sulfADIAZINE 1 % cream Commonly known as: SILVADENE   sulfamethoxazole-trimethoprim 800-160 MG tablet Commonly known as: BACTRIM DS       TAKE these medications    acetaminophen 325 MG tablet Commonly known as: TYLENOL Take 650 mg by mouth every 6 (six) hours as needed for mild pain.   ARIPiprazole 2 MG tablet Commonly known as: ABILIFY Take 2 mg by mouth daily.   FLUoxetine 20 MG capsule Commonly known as: PROZAC Take 20 mg by mouth 2 (two) times daily as needed (anxiety/depression).   losartan 50 MG tablet Commonly known as: COZAAR Take 50 mg by mouth daily.   metFORMIN 500 MG tablet Commonly known as: GLUCOPHAGE Take 1,000 mg by mouth 2 (two) times daily with a meal.   metoprolol tartrate 25 MG tablet Commonly known as: LOPRESSOR Take 1 tablet (25 mg  total) by mouth 2 (two) times daily.   mupirocin ointment 2 % Commonly known as: BACTROBAN Place 1 application into the nose 2 (two) times daily.   polyethylene glycol 17 g packet Commonly known as: MIRALAX / GLYCOLAX Take 17 g by  mouth daily.   rivaroxaban 20 MG Tabs tablet Commonly known as: XARELTO Take 20 mg by mouth daily with supper.   Semaglutide (2 MG/DOSE) 8 MG/3ML Sopn Inject 2 mg into the skin every 7 (seven) days.        No Known Allergies  Consultations: No complaints   Procedures/Studies: CT CHEST WO CONTRAST  Result Date: 09/10/2022 CLINICAL DATA:  Pneumonia, complications suspected. EXAM: CT CHEST WITHOUT CONTRAST TECHNIQUE: Multidetector CT imaging of the chest was performed following the standard protocol without IV contrast. RADIATION DOSE REDUCTION: This exam was performed according to the departmental dose-optimization program which includes automated exposure control, adjustment of the mA and/or kV according to patient size and/or use of iterative reconstruction technique. COMPARISON:  Sep 06, 2022 FINDINGS: Cardiovascular: Calcific atherosclerotic disease of the coronary arteries. Normal heart size. No pericardial effusion. Minimal calcific atherosclerotic disease of the aorta. Mediastinum/Nodes: Mildly enlarged mediastinal lymph nodes the largest measuring 1 cm in short axis, likely reactive. Lungs/Pleura: Worsening of bilateral airspace consolidation with confluent consolidation in the right upper lobe and areas of patchy consolidation throughout all of the areas of the lungs. Interval development of small right pleural effusion and tiny left pleural effusion. Upper Abdomen: No acute abnormality. Musculoskeletal: No chest wall mass or suspicious bone lesions identified. IMPRESSION: 1. Worsening of bilateral airspace consolidation with confluent consolidation in the right upper lobe and areas of patchy consolidation throughout all of the bilateral lung parenchyma. 2. Interval development of small right pleural effusion and tiny left pleural effusion. 3. Mildly enlarged mediastinal lymph nodes, likely reactive. 4. Calcific atherosclerotic disease of the coronary arteries. 5. Aortic atherosclerosis.  Aortic Atherosclerosis (ICD10-I70.0). Electronically Signed   By: Ted Mcalpine M.D.   On: 09/10/2022 20:23   DG Chest Port 1 View  Result Date: 09/08/2022 CLINICAL DATA:  Shortness of breath. EXAM: PORTABLE CHEST 1 VIEW COMPARISON:  09/05/2022, 09/06/2022. FINDINGS: Heart is enlarged and the mediastinal contour stable. The pulmonary vasculature is distended. Patchy airspace disease is noted in the mid to lower left lung. There is consolidation in the right upper lobe. No effusion or pneumothorax. No acute osseous abnormality. IMPRESSION: 1. Cardiomegaly with mildly distended pulmonary vasculature. 2. Increased right upper lobe consolidation with patchy airspace disease in the mid to lower left lung field, suspicious for pneumonia. Electronically Signed   By: Thornell Sartorius M.D.   On: 09/08/2022 23:08   ECHOCARDIOGRAM COMPLETE  Result Date: 09/07/2022    ECHOCARDIOGRAM REPORT   Patient Name:   Bradley Mcclure Date of Exam: 09/07/2022 Medical Rec #:  [id number]          Height:       73.0 in Accession #:    [id number]         Weight:       229.3 lb Date of Birth:  [birth year]          BSA:          2.280 m Patient Age:    61 years           BP:           137/74 mmHg Patient Gender: M  HR:           100 bpm. Exam Location:  Inpatient Procedure: 2D Echo, Color Doppler, Cardiac Doppler and Intracardiac            Opacification Agent Indications:    Syncope, Pulmonary Embolus  History:        Patient has prior history of Echocardiogram examinations, most                 recent 11/17/2015. Pulmonary Embolus, Signs/Symptoms:Syncope; Risk                 Factors:Dyslipidemia, Diabetes and Hypertension.  Sonographer:    Milbert Coulter Referring Phys: [id number] DAVID MANUEL ORTIZ  Sonographer Comments: Suboptimal apical window and no subcostal window. IMPRESSIONS  1. Left ventricular ejection fraction, by estimation, is 60 to 65%. The left ventricle has normal function. The left ventricle has no  regional wall motion abnormalities. There is moderate left ventricular hypertrophy. Left ventricular diastolic parameters are indeterminate.  2. Right ventricular systolic function is normal. The right ventricular size is normal.  3. The mitral valve is normal in structure. No evidence of mitral valve regurgitation. No evidence of mitral stenosis.  4. The aortic valve is normal in structure. Aortic valve regurgitation is not visualized. No aortic stenosis is present.  5. The inferior vena cava is normal in size with greater than 50% respiratory variability, suggesting right atrial pressure of 3 mmHg. FINDINGS  Left Ventricle: Left ventricular ejection fraction, by estimation, is 60 to 65%. The left ventricle has normal function. The left ventricle has no regional wall motion abnormalities. Definity contrast agent was given IV to delineate the left ventricular  endocardial borders. The left ventricular internal cavity size was normal in size. There is moderate left ventricular hypertrophy. Left ventricular diastolic parameters are indeterminate. Right Ventricle: The right ventricular size is normal. No increase in right ventricular wall thickness. Right ventricular systolic function is normal. Left Atrium: Left atrial size was normal in size. Right Atrium: Right atrial size was normal in size. Pericardium: There is no evidence of pericardial effusion. Mitral Valve: The mitral valve is normal in structure. No evidence of mitral valve regurgitation. No evidence of mitral valve stenosis. Tricuspid Valve: The tricuspid valve is normal in structure. Tricuspid valve regurgitation is not demonstrated. No evidence of tricuspid stenosis. Aortic Valve: The aortic valve is normal in structure. Aortic valve regurgitation is not visualized. No aortic stenosis is present. Aortic valve mean gradient measures 4.0 mmHg. Aortic valve peak gradient measures 6.8 mmHg. Aortic valve area, by VTI measures 3.36 cm. Pulmonic Valve: The  pulmonic valve was normal in structure. Pulmonic valve regurgitation is not visualized. No evidence of pulmonic stenosis. Aorta: The aortic root is normal in size and structure. Venous: The inferior vena cava is normal in size with greater than 50% respiratory variability, suggesting right atrial pressure of 3 mmHg. IAS/Shunts: No atrial level shunt detected by color flow Doppler.  LEFT VENTRICLE PLAX 2D LVIDd:         4.30 cm   Diastology LVIDs:         2.70 cm   LV e' medial:    8.59 cm/s LV PW:         1.40 cm   LV E/e' medial:  10.5 LV IVS:        1.30 cm   LV e' lateral:   11.10 cm/s LVOT diam:     2.50 cm   LV E/e' lateral: 8.1 LV SV:  69 LV SV Index:   30 LVOT Area:     4.91 cm  RIGHT VENTRICLE RV S prime:     15.90 cm/s TAPSE (M-mode): 2.4 cm LEFT ATRIUM         Index LA diam:    4.00 cm 1.75 cm/m  AORTIC VALVE AV Area (Vmax):    3.33 cm AV Area (Vmean):   3.24 cm AV Area (VTI):     3.36 cm AV Vmax:           130.00 cm/s AV Vmean:          86.900 cm/s AV VTI:            0.206 m AV Peak Grad:      6.8 mmHg AV Mean Grad:      4.0 mmHg LVOT Vmax:         88.30 cm/s LVOT Vmean:        57.400 cm/s LVOT VTI:          0.141 m LVOT/AV VTI ratio: 0.68  AORTA Ao Root diam: 3.50 cm Ao Asc diam:  3.50 cm MITRAL VALVE MV Area (PHT): 4.41 cm    SHUNTS MV Decel Time: 172 msec    Systemic VTI:  0.14 m MV E velocity: 90.40 cm/s  Systemic Diam: 2.50 cm MV A velocity: 83.10 cm/s MV E/A ratio:  1.09 Donato Schultz MD Electronically signed by Donato Schultz MD Signature Date/Time: 09/07/2022/10:36:58 AM    Final    CT Angio Chest PE W/Cm &/Or Wo Cm  Result Date: 09/06/2022 CLINICAL DATA:  Pulmonary embolism (PE) suspected, high prob, syncope, tachycardia EXAM: CT ANGIOGRAPHY CHEST WITH CONTRAST TECHNIQUE: Multidetector CT imaging of the chest was performed using the standard protocol during bolus administration of intravenous contrast. Multiplanar CT image reconstructions and MIPs were obtained to evaluate the vascular  anatomy. RADIATION DOSE REDUCTION: This exam was performed according to the departmental dose-optimization program which includes automated exposure control, adjustment of the mA and/or kV according to patient size and/or use of iterative reconstruction technique. CONTRAST:  75mL OMNIPAQUE IOHEXOL 350 MG/ML SOLN COMPARISON:  None Available. FINDINGS: Cardiovascular: There is adequate opacification the pulmonary arterial tree. No intraluminal filling defect identified to suggest acute pulmonary embolism. The central pulmonary arteries are mildly enlarged in keeping with changes of pulmonary arterial hypertension. Moderate atherosclerotic calcification within the left anterior descending coronary artery. Global cardiac size within normal limits. No pericardial effusion. Minimal atherosclerotic calcification within the thoracic aorta. No aortic aneurysm. Mediastinum/Nodes: Shotty mediastinal and bilateral hilar adenopathy is nonspecific, likely reactive in nature. No frankly pathologic thoracic adenopathy identified. The esophagus is unremarkable. Lungs/Pleura: Multifocal airspace infiltrate is seen predominantly within the posterior right upper lobe but also scattered within the left lower lobe and lingula most in keeping with multifocal pneumonia in the acute setting. Associated bronchial wall thickening is present in keeping with airway inflammation. No pneumothorax or pleural effusion. No central obstructing lesion. Upper Abdomen: No acute abnormality. Musculoskeletal: No acute bone abnormality. No lytic or blastic bone lesion. Review of the MIP images confirms the above findings. IMPRESSION: 1. No pulmonary embolism. 2. Multifocal airspace infiltrate most in keeping with multifocal pneumonia in the acute setting. Associated bronchial wall thickening in keeping with airway inflammation. 3. Moderate coronary artery calcification. 4. Morphologic changes in keeping with pulmonary arterial hypertension. 5.  Aortic  Atherosclerosis (ICD10-I70.0). Electronically Signed   By: Helyn Numbers M.D.   On: 09/06/2022 01:32   CT Head Wo Contrast  Result  Date: 09/06/2022 CLINICAL DATA:  Trauma. EXAM: CT HEAD WITHOUT CONTRAST CT CERVICAL SPINE WITHOUT CONTRAST TECHNIQUE: Multidetector CT imaging of the head and cervical spine was performed following the standard protocol without intravenous contrast. Multiplanar CT image reconstructions of the cervical spine were also generated. RADIATION DOSE REDUCTION: This exam was performed according to the departmental dose-optimization program which includes automated exposure control, adjustment of the mA and/or kV according to patient size and/or use of iterative reconstruction technique. COMPARISON:  None Available. FINDINGS: CT HEAD FINDINGS Brain: Mild age-related atrophy and chronic microvascular ischemic changes. There is no acute intracranial hemorrhage. No mass effect or midline shift. No extra-axial fluid collection. Vascular: No hyperdense vessel or unexpected calcification. Skull: Normal. Negative for fracture or focal lesion. Sinuses/Orbits: Mild mucoperiosteal thickening of paranasal sinuses. No air-fluid level. The mastoid air cells are clear. Right scleral banding. Other: None CT CERVICAL SPINE FINDINGS Alignment: No acute subluxation. Skull base and vertebrae: No acute fracture. Soft tissues and spinal canal: No prevertebral fluid or swelling. No visible canal hematoma. Disc levels:  No acute findings.  Degenerative changes. Upper chest: Negative. Other: Bilateral carotid bulb calcified plaques. IMPRESSION: 1. No acute intracranial pathology. Mild age-related atrophy and chronic microvascular ischemic changes. 2. No acute/traumatic cervical spine pathology. Electronically Signed   By: Elgie Collard M.D.   On: 09/06/2022 01:30   CT Cervical Spine Wo Contrast  Result Date: 09/06/2022 CLINICAL DATA:  Trauma. EXAM: CT HEAD WITHOUT CONTRAST CT CERVICAL SPINE WITHOUT CONTRAST  TECHNIQUE: Multidetector CT imaging of the head and cervical spine was performed following the standard protocol without intravenous contrast. Multiplanar CT image reconstructions of the cervical spine were also generated. RADIATION DOSE REDUCTION: This exam was performed according to the departmental dose-optimization program which includes automated exposure control, adjustment of the mA and/or kV according to patient size and/or use of iterative reconstruction technique. COMPARISON:  None Available. FINDINGS: CT HEAD FINDINGS Brain: Mild age-related atrophy and chronic microvascular ischemic changes. There is no acute intracranial hemorrhage. No mass effect or midline shift. No extra-axial fluid collection. Vascular: No hyperdense vessel or unexpected calcification. Skull: Normal. Negative for fracture or focal lesion. Sinuses/Orbits: Mild mucoperiosteal thickening of paranasal sinuses. No air-fluid level. The mastoid air cells are clear. Right scleral banding. Other: None CT CERVICAL SPINE FINDINGS Alignment: No acute subluxation. Skull base and vertebrae: No acute fracture. Soft tissues and spinal canal: No prevertebral fluid or swelling. No visible canal hematoma. Disc levels:  No acute findings.  Degenerative changes. Upper chest: Negative. Other: Bilateral carotid bulb calcified plaques. IMPRESSION: 1. No acute intracranial pathology. Mild age-related atrophy and chronic microvascular ischemic changes. 2. No acute/traumatic cervical spine pathology. Electronically Signed   By: Elgie Collard M.D.   On: 09/06/2022 01:30   DG Chest 2 View  Result Date: 09/05/2022 CLINICAL DATA:  Weakness, confusion, sore throat, fever EXAM: CHEST - 2 VIEW COMPARISON:  Chest radiograph 11/17/2015 FINDINGS: Low lung volumes accentuate cardiomediastinal silhouette and pulmonary vascularity. Bilateral interstitial coarsening likely due to low lung volumes. There is focal hazy opacity in the right lower lobe consistent with  pneumonia. No pleural effusion or pneumothorax. No displaced rib fractures. IMPRESSION: Right lower lobe pneumonia. Electronically Signed   By: Minerva Fester M.D.   On: 09/05/2022 23:50     Subjective: No complaints  Discharge Exam: Vitals:   09/16/22 0758 09/16/22 0759  BP:    Pulse:    Resp:    Temp:    SpO2: 99% 99%   Vitals:  09/15/22 2117 09/16/22 0435 09/16/22 0758 09/16/22 0759  BP: 123/64 136/82    Pulse: 88 89    Resp: 20 18    Temp: 97.9 F (36.6 C) 98.1 F (36.7 C)    TempSrc: Oral Oral    SpO2: 96% 95% 99% 99%  Weight:  96.6 kg    Height:        General: Pt is alert, awake, not in acute distress Cardiovascular: RRR, S1/S2 +, no rubs, no gallops Respiratory: CTA bilaterally, no wheezing, no rhonchi Abdominal: Soft, NT, ND, bowel sounds + Extremities: no edema, no cyanosis    The results of significant diagnostics from this hospitalization (including imaging, microbiology, ancillary and laboratory) are listed below for reference.     Microbiology: Recent Results (from the past 240 hour(s))  MRSA Next Gen by PCR, Nasal     Status: Abnormal   Collection Time: 09/09/22  7:54 AM   Specimen: Nasal Mucosa; Nasal Swab  Result Value Ref Range Status   MRSA by PCR Next Gen DETECTED (A) NOT DETECTED Final    Comment: (NOTE) The GeneXpert MRSA Assay (FDA approved for NASAL specimens only), is one component of a comprehensive MRSA colonization surveillance program. It is not intended to diagnose MRSA infection nor to guide or monitor treatment for MRSA infections. Test performance is not FDA approved in patients less than 31 years old. Performed at [facility], [address], [place], [place] [address]      Labs: BNP (last 3 results) Recent Labs    09/05/22 0059  BNP 61.7    Basic Metabolic Panel: Recent Labs  Lab 09/10/22 0201 09/10/22 1104 09/11/22 0300 09/11/22 1054 09/11/22 1758 09/12/22 0254 09/13/22 0258  09/14/22 0516 09/15/22 0515  NA 133*   < > 143   < > 143 144 137 137 137  K 4.8   < > 3.6   < > 3.0* 3.0* 3.0* 3.0* 3.4*  CL 110   < > 112*   < > 108 109 101 100 97*  CO2 10*   < > 20*   < > 20* 22 25 27  32  GLUCOSE 131*   < > 147*   < > 160* 154* 152* 167* 177*  BUN 27*   < > 22*   < > 18 21* 21* 16 14  CREATININE 0.72   < > 0.64   < > 0.62 0.63 0.44* 0.45* 0.48*  CALCIUM 9.1   < > 9.6   < > 9.6 9.3 8.9 8.8* 8.9  MG 2.0  --  1.9  --   --  1.7 1.7 1.7  --    < > = values in this interval not displayed.    Liver Function Tests: Recent Labs  Lab 09/11/22 0300 09/12/22 0254 09/13/22 0258 09/14/22 0516  AST 38 49* 109* 47*  ALT 26 38 86* 71*  ALKPHOS 64 67 70 64  BILITOT 1.1 1.5* 1.3* 1.1  PROT 6.8 7.0 7.0 6.9  ALBUMIN 2.6* 2.6* 2.5* 2.4*    No results for input(s): "LIPASE", "AMYLASE" in the last 168 hours. No results for input(s): "AMMONIA" in the last 168 hours. CBC: Recent Labs  Lab 09/10/22 0201 09/11/22 0300 09/12/22 0254 09/13/22 0258 09/14/22 0516  WBC 17.8* 12.8* 10.0 9.4 8.0  HGB 14.4 13.7 14.0 13.8 13.9  HCT 43.7 41.3 40.6 41.1 42.3  MCV 95.6 94.9 92.3 92.2 93.2  PLT 184 196 172 154 149*    Cardiac Enzymes: No results  for input(s): "CKTOTAL", "CKMB", "CKMBINDEX", "TROPONINI" in the last 168 hours. BNP: Invalid input(s): "POCBNP" CBG: Recent Labs  Lab 09/15/22 0812 09/15/22 1204 09/15/22 1637 09/15/22 2112 09/16/22 0737  GLUCAP 175* 227* 139* 168* 201*    D-Dimer No results for input(s): "DDIMER" in the last 72 hours. Hgb A1c No results for input(s): "HGBA1C" in the last 72 hours. Lipid Profile No results for input(s): "CHOL", "HDL", "LDLCALC", "TRIG", "CHOLHDL", "LDLDIRECT" in the last 72 hours. Thyroid function studies No results for input(s): "TSH", "T4TOTAL", "T3FREE", "THYROIDAB" in the last 72 hours.  Invalid input(s): "FREET3" Anemia work up No results for input(s): "VITAMINB12", "FOLATE", "FERRITIN", "TIBC", "IRON", "RETICCTPCT"  in the last 72 hours. Urinalysis    Component Value Date/Time   COLORURINE STRAW (A) 09/05/2022 2327   APPEARANCEUR CLEAR 09/05/2022 2327   LABSPEC 1.033 (H) 09/05/2022 2327   PHURINE 5.0 09/05/2022 2327   GLUCOSEU >=500 (A) 09/05/2022 2327   HGBUR MODERATE (A) 09/05/2022 2327   BILIRUBINUR NEGATIVE 09/05/2022 2327   KETONESUR 20 (A) 09/05/2022 2327   PROTEINUR 30 (A) 09/05/2022 2327   NITRITE NEGATIVE 09/05/2022 2327   LEUKOCYTESUR NEGATIVE 09/05/2022 2327   Sepsis Labs Recent Labs  Lab 09/11/22 0300 09/12/22 0254 09/13/22 0258 09/14/22 0516  WBC 12.8* 10.0 9.4 8.0    Microbiology Recent Results (from the past 240 hour(s))  MRSA Next Gen by PCR, Nasal     Status: Abnormal   Collection Time: 09/09/22  7:54 AM   Specimen: Nasal Mucosa; Nasal Swab  Result Value Ref Range Status   MRSA by PCR Next Gen DETECTED (A) NOT DETECTED Final    Comment: (NOTE) The GeneXpert MRSA Assay (FDA approved for NASAL specimens only), is one component of a comprehensive MRSA colonization surveillance program. It is not intended to diagnose MRSA infection nor to guide or monitor treatment for MRSA infections. Test performance is not FDA approved in patients less than 9 years old. Performed at [facility], [address], [place], [place] [address]     SIGNED:   Marinda Elk, MD  [facility] 09/16/2022, 8:24 AM Pager   If 7PM-7AM, please contact night-coverage [email or web address] Password [email or web address]

## 2022-09-16 NOTE — [place] FL2 (Signed)
[place] MEDICAID FL2 LEVEL OF CARE FORM     IDENTIFICATION  Patient Name: Bradley Mcclure Birthdate: 1960/07/22 Sex: male Admission Date (Current Location): 09/05/2022  [facility] and [place] Number:  Producer, television/film/video and Address:  [facility],  [address], [place] [address]      Provider Number: [id number]  Attending Physician Name and Address:  David Stall, Darin Engels, MD  Relative Name and Phone Number:  Loraine Leriche Brody([phone]    Current Level of Care: Hospital Recommended Level of Care: Skilled Nursing Facility Prior Approval Number:    Date Approved/Denied:   PASRR Number: Pending  Discharge Plan: SNF    Current Diagnoses: Patient Active Problem List   Diagnosis Date Noted   Multifocal pneumonia 09/06/2022   Hyperlipidemia 09/06/2022   Generalized anxiety disorder 09/06/2022   Major depressive disorder 09/06/2022   Type 2 diabetes mellitus with diabetic neuropathy, unspecified (HCC) 09/06/2022   Mild cognitive disorder 09/06/2022   History of retinal detachment 09/06/2022   Essential hypertension 09/06/2022   Type 2 diabetes mellitus (HCC) 09/06/2022   Sepsis due to Staphylococcus aureus (HCC) 03/01/2016   Cellulitis of left lower extremity    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   Cellulitis and abscess 12/24/2015   Balanitis 12/24/2015   Uncontrolled type 2 diabetes mellitus with hyperglycemia, with long-term current use of insulin (HCC)    Bacteremia    Morbid obesity due to excess calories (HCC)    Cellulitis of abdominal wall 11/11/2015   Sepsis (HCC) 11/11/2015   Rash 11/11/2015   Hypertension    Diabetes mellitus without complication (HCC)    Candida-induced panniculitis    GERD (gastroesophageal reflux disease) 04/19/2015   Pulmonary embolism (HCC) 03/21/2013   Non-alcoholic fatty liver disease 04/19/2011    Orientation RESPIRATION BLADDER Height & Weight     Self, Time, Situation, Place  O2 Continent Weight: 96.6  kg Height:  6\' 1"  (185.4 cm)  BEHAVIORAL SYMPTOMS/MOOD NEUROLOGICAL BOWEL NUTRITION STATUS      Continent Diet (Dys 3 diet)  AMBULATORY STATUS COMMUNICATION OF NEEDS Skin   Limited Assist Verbally Normal                       Personal Care Assistance Level of Assistance              Functional Limitations Info  Sight, Hearing, Speech Sight Info: Impaired (eyeglasses) Hearing Info: Adequate Speech Info: Adequate    SPECIAL CARE FACTORS FREQUENCY  PT (By licensed PT), OT (By licensed OT)     PT Frequency: 5x week OT Frequency: 5x week            Contractures Contractures Info: Not present    Additional Factors Info  Code Status, Allergies Code Status Info: Full Allergies Info: NKA           Current Medications (09/16/2022):  This is the current hospital active medication list Current Facility-Administered Medications  Medication Dose Route Frequency Provider Last Rate Last Admin   acetaminophen (TYLENOL) tablet 650 mg  650 mg Oral Q6H PRN Bobette Mo, MD   650 mg at 09/16/22 0837   albuterol (PROVENTIL) (2.5 MG/3ML) 0.083% nebulizer solution 2.5 mg  2.5 mg Nebulization Q4H PRN Bobette Mo, MD   2.5 mg at 09/09/22 0521   amLODipine (NORVASC) tablet 10 mg  10 mg Oral Daily Leatha Gilding, MD   10 mg at 09/16/22 0830   ARIPiprazole (ABILIFY) tablet 2 mg  2 mg Oral  Daily Bobette Mo, MD   2 mg at 09/16/22 0830   Chlorhexidine Gluconate Cloth 2 % PADS 6 each  6 each Topical Q0600 Leatha Gilding, MD   6 each at 09/16/22 0646   dextrose 50 % solution 0-50 mL  0-50 mL Intravenous PRN Leatha Gilding, MD       guaiFENesin (ROBITUSSIN) 100 MG/5ML liquid 5 mL  5 mL Oral Q4H PRN Bobette Mo, MD       hydrALAZINE (APRESOLINE) injection 5 mg  5 mg Intravenous Q4H PRN Leatha Gilding, MD   5 mg at 09/12/22 1114   insulin aspart (novoLOG) injection 0-5 Units  0-5 Units Subcutaneous QHS Leatha Gilding, MD       insulin aspart  (novoLOG) injection 0-9 Units  0-9 Units Subcutaneous TID WC Leatha Gilding, MD   3 Units at 09/16/22 1151   insulin aspart (novoLOG) injection 3 Units  3 Units Subcutaneous TID WC Marinda Elk, MD   3 Units at 09/16/22 1152   insulin glargine-yfgn (SEMGLEE) injection 10 Units  10 Units Subcutaneous Daily Leatha Gilding, MD   10 Units at 09/16/22 1033   ipratropium-albuterol (DUONEB) 0.5-2.5 (3) MG/3ML nebulizer solution 3 mL  3 mL Nebulization BID Leatha Gilding, MD   3 mL at 09/16/22 0758   losartan (COZAAR) tablet 50 mg  50 mg Oral Daily Bobette Mo, MD   50 mg at 09/16/22 0830   metFORMIN (GLUCOPHAGE) tablet 1,000 mg  1,000 mg Oral BID WC Marinda Elk, MD   1,000 mg at 09/16/22 0858   metoprolol tartrate (LOPRESSOR) injection 5 mg  5 mg Intravenous Q6H PRN Leatha Gilding, MD   5 mg at 09/08/22 1824   metoprolol tartrate (LOPRESSOR) tablet 25 mg  25 mg Oral BID Leatha Gilding, MD   25 mg at 09/16/22 0830   mupirocin ointment (BACTROBAN) 2 % 1 Application  1 Application Nasal BID Leatha Gilding, MD   1 Application at 09/16/22 0858   ondansetron (ZOFRAN) injection 4 mg  4 mg Intravenous Q6H PRN Bobette Mo, MD       Oral care mouth rinse  15 mL Mouth Rinse PRN Bobette Mo, MD       polyethylene glycol [facility]) packet 17 g  17 g Oral Daily Luiz Iron, NP   17 g at 09/15/22 1610   rivaroxaban (XARELTO) tablet 20 mg  20 mg Oral Q supper Bobette Mo, MD   20 mg at 09/15/22 1619   sodium chloride (OCEAN) 0.65 % nasal spray 1 spray  1 spray Each Nare PRN Leatha Gilding, MD   1 spray at 09/09/22 0555   sodium chloride flush (NS) 0.9 % injection 3 mL  3 mL Intravenous Q12H Bobette Mo, MD   3 mL at 09/16/22 9604     Discharge Medications: Please see discharge summary for a list of discharge medications.  Relevant Imaging Results:  Relevant Lab Results:   Additional Information SS#[phone],  Olegario Messier, [place]

## 2022-09-16 NOTE — Progress Notes (Signed)
Physical Therapy Treatment Patient Details Name: Bradley Mcclure MRN: [id number] DOB: Nov 24, 1960 Today's Date: 09/16/2022   History of Present Illness 62 year old male with prior history of MRSA bacteremia, anxiety, depression, mild cognitive disorder, HTN, HLD, DM2, NAFLD, prior PE on chronic anticoagulation, obesity comes to the hospital with several days of URI type symptoms, cough, chest congestion and eventually passing out.  In the ED he was found to have right lower lobe pneumonia, he was started on antibiotics and admitted to the hospital.  Hospital course complicated by worsening metabolic acidosis which did not have a clear etiology initially, however it was found to be due to euglycemic DKA.  He was moved to stepdown, maintained on insulin drip for 2 days with improvement in his acidosis.  Currently on 4 east.    PT Comments    General Comments: cognition and level of alertness continue to improve.  AxO x 3 just a slow talker and slow to respond.  Following all commands and able to recall recent events as well as watching the vertict about Trump and commenting. Assisted OOB to amb in hallway.  General transfer comment: Required ModMin Asisst rise from EOB.  Slow.  Poor forward flexed posture.  Unsteady.  Also RIGHT lean. General Gait Details: limited amb distance due tyo weakness/effort requiring a walker for support.  Prior pt did NOT use any AD.  Poor forward fled posture.  Sluggish/shuffled gait.  Trial RA decreased to 85%.  Reapplied 2 lts to achieve sats 91%.  Pt presents with low tidal volume.  Encouraged "depp breaths" but quickly reverts back.  Unsteady. esp with turns.  Slow Bradykinesia with delayed corrective reaction responce.  HIGH FALL RISK. Pt lives home alone and working night shift as a Clinical cytogeneticist.  Pt will need ST Rehab at SNF to address mobility and functional decline prior to safely returning home.   Recommendations for follow up therapy are one  component of a multi-disciplinary discharge planning process, led by the attending physician.  Recommendations may be updated based on patient status, additional functional criteria and insurance authorization.  Follow Up Recommendations  Can patient physically be transported by private vehicle: No    Assistance Recommended at Discharge    Patient can return home with the following A lot of help with walking and/or transfers;A lot of help with bathing/dressing/bathroom;Assistance with cooking/housework;Assist for transportation;Help with stairs or ramp for entrance   Equipment Recommendations  Rolling walker (2 wheels)    Recommendations for Other Services       Precautions / Restrictions Precautions Precautions: Fall Precaution Comments: monitor sats Restrictions Weight Bearing Restrictions: No     Mobility  Bed Mobility Overal bed mobility: Needs Assistance Bed Mobility: Supine to Sit, Sit to Supine     Supine to sit: Mod assist Sit to supine: Mod assist, Max assist   General bed mobility comments: Required Mod Assist for upper body support to transition to EOB.  Slow.   Right lean.  Increased assist back to bed.    Transfers Overall transfer level: Needs assistance Equipment used: Rolling walker (2 wheels) Transfers: Sit to/from Stand Sit to Stand: Min assist, Mod assist           General transfer comment: Required ModMin Asisst rise from EOB.  Slow.  Poor forward flexed posture.  Unsteady.  Also RIGHT lean.    Ambulation/Gait Ambulation/Gait assistance: Mod assist, Min assist Gait Distance (Feet): 42 Feet Assistive device: Rolling walker (2 wheels) Gait Pattern/deviations: Step-through pattern,  Decreased stride length, Drifts right/left, Knee flexed in stance - right, Knee flexed in stance - left, Shuffle Gait velocity: decreased     General Gait Details: limited amb distance due tyo weakness/effort requiring a walker for support.  Prior pt did NOT use any  AD.  Poor forward fled posture.  Sluggish/shuffled gait.  Trial RA decreased to 85%.  Reapplied 2 lts to achieve sats 91%.  Pt presents with low tidal volume.  Encouraged "depp breaths" but quickly reverts back.  Unsteady. esp with turns.  Slow Bradykinesia with delayed corrective reaction responce.  HIGH FALL RISK.   Stairs             Wheelchair Mobility    Modified Rankin (Stroke Patients Only)       Balance                                            Cognition Arousal/Alertness: Awake/alert Behavior During Therapy: WFL for tasks assessed/performed Overall Cognitive Status: No family/caregiver present to determine baseline cognitive functioning                                 General Comments: cognition and level of alertness continue to improve.  AxO x 3 just a slow talker and slow to respond.  Following all commands and able to recall recent events as well as watching the vertict about Trump and commenting.        Exercises      General Comments        Pertinent Vitals/Pain Pain Assessment Pain Assessment: No/denies pain    Home Living                          Prior Function            PT Goals (current goals can now be found in the care plan section) Progress towards PT goals: Progressing toward goals    Frequency    Min 1X/week      PT Plan Current plan remains appropriate    Co-evaluation              AM-PAC PT "6 Clicks" Mobility   Outcome Measure  Help needed turning from your back to your side while in a flat bed without using bedrails?: A Lot Help needed moving from lying on your back to sitting on the side of a flat bed without using bedrails?: A Lot Help needed moving to and from a bed to a chair (including a wheelchair)?: A Lot Help needed standing up from a chair using your arms (e.g., wheelchair or bedside chair)?: A Lot Help needed to walk in hospital room?: A Lot Help needed  climbing 3-5 steps with a railing? : A Lot 6 Click Score: 12    End of Session Equipment Utilized During Treatment: Gait belt Activity Tolerance: Patient limited by fatigue Patient left: in bed;with call bell/phone within reach;with bed alarm set Nurse Communication: Mobility status PT Visit Diagnosis: Unsteadiness on feet (R26.81);Muscle weakness (generalized) (M62.81);Other abnormalities of gait and mobility (R26.89);Difficulty in walking, not elsewhere classified (R26.2)     Time: 1610-9604 PT Time Calculation (min) (ACUTE ONLY): 28 min  Charges:  $Gait Training: 8-22 mins $Therapeutic Activity: 8-22 mins  Felecia Shelling  PTA Acute  Rehabilitation Services Office M-F          [phone]

## 2022-09-17 LAB — GLUCOSE, CAPILLARY
Glucose-Capillary: 194 mg/dL — ABNORMAL HIGH (ref 70–99)
Glucose-Capillary: 252 mg/dL — ABNORMAL HIGH (ref 70–99)
Glucose-Capillary: 140 mg/dL — ABNORMAL HIGH (ref 70–99)
Glucose-Capillary: 131 mg/dL — ABNORMAL HIGH (ref 70–99)

## 2022-09-17 MED ORDER — HYDROCODONE-ACETAMINOPHEN 5-325 MG PO TABS
1.0000 | ORAL_TABLET | Freq: Once | ORAL | Status: AC
  Administered 2022-09-17: 1 via ORAL
  Filled 2022-09-17: qty 1

## 2022-09-17 NOTE — Progress Notes (Signed)
[facility] PROGRESS NOTE    Progress Note  Bradley Mcclure  ZOX:[id number] DOB: 11/03/1960 DOA: 09/05/2022 PCP: [facility]     Brief Narrative:   Bradley Mcclure is an 62 y.o. male past medical history of MRSA bacteremia, Zaidi, depression, essential hypertension hyperlipidemia nonfatty liver disease, prior history of PE on chronic anticoagulation comes into the hospital for several days of upper respiratory symptoms cough and congestion, hospital course was complicated by metabolic acidosis which turned out to be euglycemic DKA moved to stepdown and started on an insulin drip for 2 days now acidosis has resolved.   Assessment/Plan:   Sepsis secondarily to Multifocal pneumonia He has completed 8-day course of antibiotics. Awaiting skilled nursing facility placement.  Euglycemic DKA/diabetes mellitus type 2: On long-acting insulin per sliding scale, will start him on 3 units with meals. Continue CBGs ACHS.  History of PE: Continue Xarelto.  Hypokalemia: Still low replete orally recheck in the morning.  Essential hypertension: Continue statin.  Number cytopenia The setting of acute illness now resolved.  History of depression: Continue Abilify.  Left buttock stage I RN Pressure Injury Documentation: Pressure Injury 09/11/22 Buttocks Left Deep Tissue Pressure Injury - Purple or maroon localized area of discolored intact skin or blood-filled blister due to damage of underlying soft tissue from pressure and/or shear. red, pink, purple (Active)  09/11/22 1918  Location: Buttocks  Location Orientation: Left  Staging: Deep Tissue Pressure Injury - Purple or maroon localized area of discolored intact skin or blood-filled blister due to damage of underlying soft tissue from pressure and/or shear.  Wound Description (Comments): red, pink, purple  Present on Admission: No  Dressing Type Foam - Lift dressing to assess site every shift 09/16/22 0830     Estimated body mass index is 27.78 kg/m as calculated from the following:   Height as of this encounter: 6\' 1"  (1.854 m).   Weight as of this encounter: 95.5 kg.   DVT prophylaxis: lovenox Family Communication:none Status is: Inpatient Remains inpatient appropriate because: Sepsis and DKA    Code Status:     Code Status Orders  (From admission, onward)           Start     Ordered   09/06/22 1409  Full code  Continuous       Question:  By:  Answer:  Consent: discussion documented in EHR   09/06/22 1411           Code Status History     Date Active Date Inactive Code Status Order ID Comments User Context   09/06/2022 0825 09/06/2022 1411 Full Code [id number]  Bobette Mo, MD ED   02/28/2016 1739 03/01/2016 1755 Full Code [id number]  Deneise Lever, MD Inpatient   11/11/2015 0505 11/17/2015 2143 Full Code [id number]  Lorretta Harp, MD Inpatient      Advance Directive Documentation    Flowsheet Row Most Recent Value  Type of Advance Directive Healthcare Power of Attorney, Living will  Pre-existing out of facility DNR order (yellow form or pink MOST form) --  "MOST" Form in Place? --         IV Access:   Peripheral IV   Procedures and diagnostic studies:   No results found.   Medical Consultants:   None.   Subjective:    Bradley Mcclure sleepy this morning no complaints  Objective:    Vitals:   09/16/22 1201 09/16/22 2033 09/17/22 0500 09/17/22 0614  BP: (!) 98/55 123/85  129/72  Pulse: 90 95  79  Resp: 20   18  Temp: 97.9 F (36.6 C) 97.7 F (36.5 C)  97.7 F (36.5 C)  TempSrc: Oral Oral  Oral  SpO2: 96% 94%  93%  Weight:   95.5 kg   Height:       SpO2: 93 % O2 Flow Rate (L/min): 3 L/min FiO2 (%): 32 %   Intake/Output Summary (Last 24 hours) at 09/17/2022 0848 Last data filed at 09/17/2022 0846 Gross per 24 hour  Intake 1140 ml  Output 2550 ml  Net -1410 ml    Filed Weights   09/15/22 0524 09/16/22 0435 09/17/22  0500  Weight: 96.5 kg 96.6 kg 95.5 kg    Exam: General exam: In no acute distress. Respiratory system: Good air movement and clear to auscultation. Cardiovascular system: S1 & S2 heard, RRR. No JVD. Gastrointestinal system: Abdomen is nondistended, soft and nontender.  Extremities: No pedal edema. Skin: No rashes, lesions or ulcers Psychiatry: Judgement and insight appear normal. Mood & affect appropriate.    Data Reviewed:    Labs: Basic Metabolic Panel: Recent Labs  Lab 09/11/22 0300 09/11/22 1054 09/11/22 1758 09/12/22 0254 09/13/22 0258 09/14/22 0516 09/15/22 0515  NA 143   < > 143 144 137 137 137  K 3.6   < > 3.0* 3.0* 3.0* 3.0* 3.4*  CL 112*   < > 108 109 101 100 97*  CO2 20*   < > 20* 22 25 27  32  GLUCOSE 147*   < > 160* 154* 152* 167* 177*  BUN 22*   < > 18 21* 21* 16 14  CREATININE 0.64   < > 0.62 0.63 0.44* 0.45* 0.48*  CALCIUM 9.6   < > 9.6 9.3 8.9 8.8* 8.9  MG 1.9  --   --  1.7 1.7 1.7  --    < > = values in this interval not displayed.    GFR Estimated Creatinine Clearance: 112.4 mL/min (A) (by C-G formula based on SCr of 0.48 mg/dL (L)). Liver Function Tests: Recent Labs  Lab 09/11/22 0300 09/12/22 0254 09/13/22 0258 09/14/22 0516  AST 38 49* 109* 47*  ALT 26 38 86* 71*  ALKPHOS 64 67 70 64  BILITOT 1.1 1.5* 1.3* 1.1  PROT 6.8 7.0 7.0 6.9  ALBUMIN 2.6* 2.6* 2.5* 2.4*    No results for input(s): "LIPASE", "AMYLASE" in the last 168 hours. No results for input(s): "AMMONIA" in the last 168 hours. Coagulation profile No results for input(s): "INR", "PROTIME" in the last 168 hours. COVID-19 Labs  No results for input(s): "DDIMER", "FERRITIN", "LDH", "CRP" in the last 72 hours.  Lab Results  Component Value Date   SARSCOV2NAA NEGATIVE 09/05/2022    CBC: Recent Labs  Lab 09/11/22 0300 09/12/22 0254 09/13/22 0258 09/14/22 0516  WBC 12.8* 10.0 9.4 8.0  HGB 13.7 14.0 13.8 13.9  HCT 41.3 40.6 41.1 42.3  MCV 94.9 92.3 92.2 93.2  PLT  196 172 154 149*    Cardiac Enzymes: No results for input(s): "CKTOTAL", "CKMB", "CKMBINDEX", "TROPONINI" in the last 168 hours. BNP (last 3 results) No results for input(s): "PROBNP" in the last 8760 hours. CBG: Recent Labs  Lab 09/15/22 2112 09/16/22 0737 09/16/22 1136 09/16/22 2024 09/17/22 0736  GLUCAP 168* 201* 215* 189* 194*    D-Dimer: No results for input(s): "DDIMER" in the last 72 hours. Hgb A1c: No results for input(s): "HGBA1C" in the last 72 hours. Lipid Profile: No results for input(s): "CHOL", "HDL", "  LDLCALC", "TRIG", "CHOLHDL", "LDLDIRECT" in the last 72 hours. Thyroid function studies: No results for input(s): "TSH", "T4TOTAL", "T3FREE", "THYROIDAB" in the last 72 hours.  Invalid input(s): "FREET3" Anemia work up: No results for input(s): "VITAMINB12", "FOLATE", "FERRITIN", "TIBC", "IRON", "RETICCTPCT" in the last 72 hours. Sepsis Labs: Recent Labs  Lab 09/11/22 0300 09/12/22 0254 09/13/22 0258 09/14/22 0516  WBC 12.8* 10.0 9.4 8.0    Microbiology Recent Results (from the past 240 hour(s))  MRSA Next Gen by PCR, Nasal     Status: Abnormal   Collection Time: 09/09/22  7:54 AM   Specimen: Nasal Mucosa; Nasal Swab  Result Value Ref Range Status   MRSA by PCR Next Gen DETECTED (A) NOT DETECTED Final    Comment: (NOTE) The GeneXpert MRSA Assay (FDA approved for NASAL specimens only), is one component of a comprehensive MRSA colonization surveillance program. It is not intended to diagnose MRSA infection nor to guide or monitor treatment for MRSA infections. Test performance is not FDA approved in patients less than 70 years old. Performed at [facility], [address] [place], [place] [address]      Medications:    amLODipine  10 mg Oral Daily   ARIPiprazole  2 mg Oral Daily   insulin aspart  0-5 Units Subcutaneous QHS   insulin aspart  0-9 Units Subcutaneous TID WC   insulin aspart  3 Units Subcutaneous TID WC    insulin glargine-yfgn  10 Units Subcutaneous Daily   losartan  50 mg Oral Daily   metFORMIN  1,000 mg Oral BID WC   metoprolol tartrate  25 mg Oral BID   mupirocin ointment  1 Application Nasal BID   polyethylene glycol  17 g Oral Daily   rivaroxaban  20 mg Oral Q supper   sodium chloride flush  3 mL Intravenous Q12H   Continuous Infusions:    LOS: 11 days   Marinda Elk  [facility]  09/17/2022, 8:48 AM

## 2022-09-17 NOTE — TOC Progression Note (Addendum)
Transition of Care [facility]) - Progression Note    Patient Details  Name: Bradley Mcclure MRN: [id number] Date of Birth: [birth year]  Transition of Care [facility]) CM/SW Contact  Adrian Prows, RN Phone Number: 09/17/2022, 2:13 PM  Clinical Narrative:    Unable to access [place] Must to obtain PASRR # and send additional documents.  [id number] 2nd attempt to access [place] Must without success    Barriers to Discharge: Continued Medical Work up  Expected Discharge Plan and Services     Post Acute Care Choice: Skilled Nursing Facility Living arrangements for the past 2 months: Single Family Home Expected Discharge Date: 09/15/22                                     Social Determinants of Health (SDOH) Interventions SDOH Screenings   Food Insecurity: No Food Insecurity (09/06/2022)  Housing: Low Risk  (09/06/2022)  Transportation Needs: No Transportation Needs (09/06/2022)  Utilities: Not At Risk (09/06/2022)  Tobacco Use: Low Risk  (09/06/2022)    Readmission Risk Interventions     No data to display

## 2022-09-18 LAB — GLUCOSE, CAPILLARY
Glucose-Capillary: 190 mg/dL — ABNORMAL HIGH (ref 70–99)
Glucose-Capillary: 197 mg/dL — ABNORMAL HIGH (ref 70–99)
Glucose-Capillary: 173 mg/dL — ABNORMAL HIGH (ref 70–99)
Glucose-Capillary: 199 mg/dL — ABNORMAL HIGH (ref 70–99)

## 2022-09-18 MED ORDER — INSULIN GLARGINE-YFGN 100 UNIT/ML ~~LOC~~ SOLN
10.0000 [IU] | Freq: Two times a day (BID) | SUBCUTANEOUS | Status: DC
  Administered 2022-09-18 – 2022-09-19 (×3): 10 [IU] via SUBCUTANEOUS
  Filled 2022-09-18 (×4): qty 0.1

## 2022-09-18 NOTE — Progress Notes (Signed)
[facility] PROGRESS NOTE    Progress Note  Bradley Mcclure  WUJ:[id number] DOB: May 29, 1960 DOA: 09/05/2022 PCP: [facility]     Brief Narrative:   Bradley Mcclure is an 62 y.o. male past medical history of MRSA bacteremia, Zaidi, depression, essential hypertension hyperlipidemia nonfatty liver disease, prior history of PE on chronic anticoagulation comes into the hospital for several days of upper respiratory symptoms cough and congestion, hospital course was complicated by metabolic acidosis which turned out to be euglycemic DKA moved to stepdown and started on an insulin drip for 2 days now acidosis has resolved.   Assessment/Plan:   Sepsis secondarily to Multifocal pneumonia He has completed 8-day course of antibiotics. Awaiting skilled nursing facility placement.  Euglycemic DKA/diabetes mellitus type 2: On long-acting insulin per sliding scale, will start him on 3 units with meals. Continue CBGs ACHS.  History of PE: Continue Xarelto.  Hypokalemia: Still low replete orally recheck in the morning.  Essential hypertension: Continue statin.  Number cytopenia The setting of acute illness now resolved.  History of depression: Continue Abilify.  Left buttock stage I RN Pressure Injury Documentation: Pressure Injury 09/11/22 Buttocks Left Deep Tissue Pressure Injury - Purple or maroon localized area of discolored intact skin or blood-filled blister due to damage of underlying soft tissue from pressure and/or shear. red, pink, purple (Active)  09/11/22 1918  Location: Buttocks  Location Orientation: Left  Staging: Deep Tissue Pressure Injury - Purple or maroon localized area of discolored intact skin or blood-filled blister due to damage of underlying soft tissue from pressure and/or shear.  Wound Description (Comments): red, pink, purple  Present on Admission: No  Dressing Type Foam - Lift dressing to assess site every shift 09/17/22 2130     Estimated body mass index is 27.78 kg/m as calculated from the following:   Height as of this encounter: 6\' 1"  (1.854 m).   Weight as of this encounter: 95.5 kg.   DVT prophylaxis: lovenox Family Communication:none Status is: Inpatient Remains inpatient appropriate because: Sepsis and DKA    Code Status:     Code Status Orders  (From admission, onward)           Start     Ordered   09/06/22 1409  Full code  Continuous       Question:  By:  Answer:  Consent: discussion documented in EHR   09/06/22 1411           Code Status History     Date Active Date Inactive Code Status Order ID Comments User Context   09/06/2022 0825 09/06/2022 1411 Full Code [id number]  Bobette Mo, MD ED   02/28/2016 1739 03/01/2016 1755 Full Code [id number]  Deneise Lever, MD Inpatient   11/11/2015 0505 11/17/2015 2143 Full Code [id number]  Lorretta Harp, MD Inpatient      Advance Directive Documentation    Flowsheet Row Most Recent Value  Type of Advance Directive Healthcare Power of Attorney, Living will  Pre-existing out of facility DNR order (yellow form or pink MOST form) --  "MOST" Form in Place? --         IV Access:   Peripheral IV   Procedures and diagnostic studies:   No results found.   Medical Consultants:   None.   Subjective:    [name] sleepy this morning no complaints  Objective:    Vitals:   09/17/22 1257 09/17/22 2011 09/17/22 2118 09/18/22 0529  BP: 98/71 114/71  102/67  Pulse: 85 92  84  Resp: 19 19  19   Temp: 98.2 F (36.8 C) 97.6 F (36.4 C)  98.9 F (37.2 C)  TempSrc: Oral Oral    SpO2: 93% 93% 95% 93%  Weight:      Height:       SpO2: 93 % O2 Flow Rate (L/min): 2 L/min FiO2 (%): 32 %   Intake/Output Summary (Last 24 hours) at 09/18/2022 0822 Last data filed at 09/18/2022 0440 Gross per 24 hour  Intake 1510 ml  Output 2800 ml  Net -1290 ml    Filed Weights   09/15/22 0524 09/16/22 0435 09/17/22 0500   Weight: 96.5 kg 96.6 kg 95.5 kg    Exam: General exam: In no acute distress. Respiratory system: Good air movement and clear to auscultation. Cardiovascular system: S1 & S2 heard, RRR. No JVD. Gastrointestinal system: Abdomen is nondistended, soft and nontender.  Extremities: No pedal edema. Skin: No rashes, lesions or ulcers Psychiatry: Judgement and insight appear normal. Mood & affect appropriate.    Data Reviewed:    Labs: Basic Metabolic Panel: Recent Labs  Lab 09/11/22 1758 09/12/22 0254 09/13/22 0258 09/14/22 0516 09/15/22 0515  NA 143 144 137 137 137  K 3.0* 3.0* 3.0* 3.0* 3.4*  CL 108 109 101 100 97*  CO2 20* 22 25 27  32  GLUCOSE 160* 154* 152* 167* 177*  BUN 18 21* 21* 16 14  CREATININE 0.62 0.63 0.44* 0.45* 0.48*  CALCIUM 9.6 9.3 8.9 8.8* 8.9  MG  --  1.7 1.7 1.7  --     GFR Estimated Creatinine Clearance: 112.4 mL/min (A) (by C-G formula based on SCr of 0.48 mg/dL (L)). Liver Function Tests: Recent Labs  Lab 09/12/22 0254 09/13/22 0258 09/14/22 0516  AST 49* 109* 47*  ALT 38 86* 71*  ALKPHOS 67 70 64  BILITOT 1.5* 1.3* 1.1  PROT 7.0 7.0 6.9  ALBUMIN 2.6* 2.5* 2.4*    No results for input(s): "LIPASE", "AMYLASE" in the last 168 hours. No results for input(s): "AMMONIA" in the last 168 hours. Coagulation profile No results for input(s): "INR", "PROTIME" in the last 168 hours. COVID-19 Labs  No results for input(s): "DDIMER", "FERRITIN", "LDH", "CRP" in the last 72 hours.  Lab Results  Component Value Date   SARSCOV2NAA NEGATIVE 09/05/2022    CBC: Recent Labs  Lab 09/12/22 0254 09/13/22 0258 09/14/22 0516  WBC 10.0 9.4 8.0  HGB 14.0 13.8 13.9  HCT 40.6 41.1 42.3  MCV 92.3 92.2 93.2  PLT 172 154 149*    Cardiac Enzymes: No results for input(s): "CKTOTAL", "CKMB", "CKMBINDEX", "TROPONINI" in the last 168 hours. BNP (last 3 results) No results for input(s): "PROBNP" in the last 8760 hours. CBG: Recent Labs  Lab 09/17/22 0736  09/17/22 1121 09/17/22 1640 09/17/22 2017 09/18/22 0818  GLUCAP 194* 252* 140* 131* 190*    D-Dimer: No results for input(s): "DDIMER" in the last 72 hours. Hgb A1c: No results for input(s): "HGBA1C" in the last 72 hours. Lipid Profile: No results for input(s): "CHOL", "HDL", "LDLCALC", "TRIG", "CHOLHDL", "LDLDIRECT" in the last 72 hours. Thyroid function studies: No results for input(s): "TSH", "T4TOTAL", "T3FREE", "THYROIDAB" in the last 72 hours.  Invalid input(s): "FREET3" Anemia work up: No results for input(s): "VITAMINB12", "FOLATE", "FERRITIN", "TIBC", "IRON", "RETICCTPCT" in the last 72 hours. Sepsis Labs: Recent Labs  Lab 09/12/22 0254 09/13/22 0258 09/14/22 0516  WBC 10.0 9.4 8.0    Microbiology Recent Results (from the past 240 hour(s))  MRSA Next Gen by PCR, Nasal     Status: Abnormal   Collection Time: 09/09/22  7:54 AM   Specimen: Nasal Mucosa; Nasal Swab  Result Value Ref Range Status   MRSA by PCR Next Gen DETECTED (A) NOT DETECTED Final    Comment: (NOTE) The GeneXpert MRSA Assay (FDA approved for NASAL specimens only), is one component of a comprehensive MRSA colonization surveillance program. It is not intended to diagnose MRSA infection nor to guide or monitor treatment for MRSA infections. Test performance is not FDA approved in patients less than 41 years old. Performed at [facility], [address] [place], [place] [address]      Medications:    amLODipine  10 mg Oral Daily   ARIPiprazole  2 mg Oral Daily   insulin aspart  0-5 Units Subcutaneous QHS   insulin aspart  0-9 Units Subcutaneous TID WC   insulin aspart  3 Units Subcutaneous TID WC   insulin glargine-yfgn  10 Units Subcutaneous Daily   losartan  50 mg Oral Daily   metFORMIN  1,000 mg Oral BID WC   metoprolol tartrate  25 mg Oral BID   polyethylene glycol  17 g Oral Daily   rivaroxaban  20 mg Oral Q supper   sodium chloride flush  3 mL Intravenous Q12H    Continuous Infusions:    LOS: 12 days   Marinda Elk  [facility]  09/18/2022, 8:22 AM

## 2022-09-18 NOTE — TOC Initial Note (Signed)
Transition of Care [facility]) - Initial/Assessment Note    Patient Details  Name: Bradley Mcclure MRN: [id number] Date of Birth: [birth year]  [facility]) CM/SW Contact:    Adrian Prows, RN Phone Number: 09/18/2022, 5:40 PM  Clinical Narrative:                 Additional documents sent to [place] (30 day PASRR note, FL2, and progress notes; awaiting PASRR #.    Barriers to Discharge: Continued Medical Work up   Patient Goals and CMS Choice Patient states their goals for this hospitalization and ongoing recovery are:: Rehab CMS Medicare.gov Compare Post Acute Care list provided to:: Patient Choice offered to / list presented to : Patient [place] ownership interest in [facility].provided to:: Patient    Expected Discharge Plan and Services     Post Acute Care Choice: Skilled Nursing Facility Living arrangements for the past 2 months: Single Family Home Expected Discharge Date: 09/15/22                                    Prior Living Arrangements/Services Living arrangements for the past 2 months: Single Family Home Lives with:: Self                   Activities of Daily Living Home Assistive Devices/Equipment: Eyeglasses ADL Screening (condition at time of admission) Patient's cognitive ability adequate to safely complete daily activities?: Yes Is the patient deaf or have difficulty hearing?: No Does the patient have difficulty seeing, even when wearing glasses/contacts?: No Does the patient have difficulty concentrating, remembering, or making decisions?: No Patient able to express need for assistance with ADLs?: No Does the patient have difficulty dressing or bathing?: No Independently performs ADLs?: Yes (appropriate for developmental age) Does the patient have difficulty walking or climbing stairs?: No Weakness of Legs: None Weakness of Arms/Hands: Both  Permission Sought/Granted                  Emotional  Assessment              Admission diagnosis:  Syncope and collapse [R55] Multifocal pneumonia [J18.9] Patient Active Problem List   Diagnosis Date Noted   Multifocal pneumonia 09/06/2022   Hyperlipidemia 09/06/2022   Generalized anxiety disorder 09/06/2022   Major depressive disorder 09/06/2022   Type 2 diabetes mellitus with diabetic neuropathy, unspecified (HCC) 09/06/2022   Mild cognitive disorder 09/06/2022   History of retinal detachment 09/06/2022   Essential hypertension 09/06/2022   Type 2 diabetes mellitus (HCC) 09/06/2022   Sepsis due to Staphylococcus aureus (HCC) 03/01/2016   Cellulitis of left lower extremity    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   Cellulitis and abscess 12/24/2015   Balanitis 12/24/2015   Uncontrolled type 2 diabetes mellitus with hyperglycemia, with long-term current use of insulin (HCC)    Bacteremia    Morbid obesity due to excess calories (HCC)    Cellulitis of abdominal wall 11/11/2015   Sepsis (HCC) 11/11/2015   Rash 11/11/2015   Hypertension    Diabetes mellitus without complication (HCC)    Candida-induced panniculitis    GERD (gastroesophageal reflux disease) 04/19/2015   Pulmonary embolism (HCC) 03/21/2013   Non-alcoholic fatty liver disease 04/19/2011   PCP:  [facility] Pharmacy:   [facility] [id number],  - [address] [place] [address] Phone: [phone] Fax:  [phone]     Social Determinants of Health (SDOH) Social History: SDOH Screenings   Food Insecurity: No Food Insecurity (09/06/2022)  Housing: Low Risk  (09/06/2022)  Transportation Needs: No Transportation Needs (09/06/2022)  Utilities: Not At Risk (09/06/2022)  Tobacco Use: Low Risk  (09/06/2022)   SDOH Interventions:     Readmission Risk Interventions     No data to display

## 2022-09-19 LAB — GLUCOSE, CAPILLARY
Glucose-Capillary: 178 mg/dL — ABNORMAL HIGH (ref 70–99)
Glucose-Capillary: 166 mg/dL — ABNORMAL HIGH (ref 70–99)
Glucose-Capillary: 212 mg/dL — ABNORMAL HIGH (ref 70–99)

## 2022-09-19 LAB — CBC
HCT: 42.3 % (ref 39.0–52.0)
Hemoglobin: 14 g/dL (ref 13.0–17.0)
MCH: 31 pg (ref 26.0–34.0)
MCHC: 33.1 g/dL (ref 30.0–36.0)
MCV: 93.6 fL (ref 80.0–100.0)
Platelets: 185 10*3/uL (ref 150–400)
RBC: 4.52 MIL/uL (ref 4.22–5.81)
RDW: 13.6 % (ref 11.5–15.5)
WBC: 8.7 10*3/uL (ref 4.0–10.5)
nRBC: 0 % (ref 0.0–0.2)

## 2022-09-19 NOTE — Progress Notes (Signed)
Speech Language Pathology Treatment: Dysphagia  Patient Details Name: Bradley Mcclure MRN: [id number] DOB: [birth year] Today's Date: 09/19/2022 Time: 1100-1115 SLP Time Calculation (min) (ACUTE ONLY): 15 min  Assessment / Plan / Recommendation Clinical Impression  Patient seen by SLP for skilled treatment focused on dysphagia goals. He had eyes closed but was easy to awaken. He responded to questions with a delay and aside from telling SLP "thank you for your help" at end of session, he did not initiate to comment or request without cues. He was receptive to having saltines with peanut butter and requested ginger ale to drink. He was able to feed himself without assistance. SLP did not observe any overt s/s aspiration but did observe delayed mastication and bolus formation with solids, leading to PO residuals on tongue and left side buccal cavity. PO residuals cleared to trace amount after a couple bites of applesauce. SLP recommending continue with current diet (Dys 3, thin liquids). SLP will continue to follow while admitted but he is planned discharge to SNF today; recommend skilled SLP intervention at SNF for dysphagia goals.    HPI HPI: Pt is a 62 yo male adm to [facility] 5/20 after several days of URI  - with cough, congestion and eventually passing out.  PMH + for MRSA bacteremia, anxiety, depression, mild cognitive disorder, HTN, HLD, DM2, NAFLD, prior PE on chronic anticoagulation, obesity.  Medical Disabilty 40.9811 -Jul 10, 2018 - dx at [place].  Per RN, pt reports to her that he takes Abilify.  Pt had an episode of coughing/choking on eggs this am - concerning RN for airway protection - thus SLP swallow eval ordered.      SLP Plan  Continue with current plan of care      Recommendations for follow up therapy are one component of a multi-disciplinary discharge planning process, led by the attending physician.  Recommendations may be updated based on patient status, additional functional criteria  and insurance authorization.    Recommendations  Diet recommendations: Dysphagia 3 (mechanical soft);Thin liquid Liquids provided via: Cup;Straw Medication Administration: Whole meds with puree Supervision: Patient able to self feed;Full supervision/cueing for compensatory strategies Compensations: Slow rate;Small sips/bites Postural Changes and/or Swallow Maneuvers: Seated upright 90 degrees                  Oral care BID   Frequent or constant Supervision/Assistance Dysphagia, oral phase (R13.11)     Continue with current plan of care    Angela Nevin, MA, CCC-SLP Speech Therapy

## 2022-09-19 NOTE — TOC Progression Note (Addendum)
Transition of Care [facility]) - Progression Note    Patient Details  Name: Bradley Mcclure MRN: [id number] Date of Birth: 11-14-1960  Transition of Care [facility]) CM/SW Contact  Allia Wiltsey, Olegario Messier, RN Phone Number: 09/19/2022, 11:11 AM  Clinical Narrative: Updated info sent to [facility] must per request-H&P;they already have fl2, & 30 day note-await pasrr.  [facility] rep sheila reviewing, [facility] reviewing, [facility] left vm. -1:22p-spoke to [employer] [phone], [facility] [phone] left vm per Anna-patient has no [facility] serve connection. TC Admit Kendal Hymen provided [employer] #[id number] eff 04/18/21.will start auth.      Barriers to Discharge: Continued Medical Work up  Expected Discharge Plan and Services     Post Acute Care Choice: Skilled Nursing Facility Living arrangements for the past 2 months: Single Family Home Expected Discharge Date: 09/15/22                                     Social Determinants of Health (SDOH) Interventions SDOH Screenings   Food Insecurity: No Food Insecurity (09/06/2022)  Housing: Low Risk  (09/06/2022)  Transportation Needs: No Transportation Needs (09/06/2022)  Utilities: Not At Risk (09/06/2022)  Tobacco Use: Low Risk  (09/06/2022)    Readmission Risk Interventions     No data to display

## 2022-09-19 NOTE — Progress Notes (Signed)
[facility] PROGRESS NOTE    Progress Note  Bradley Mcclure  BJY:[id number] DOB: May 24, 1960 DOA: 09/05/2022 PCP: [facility]     Brief Narrative:   Bradley Mcclure is an 62 y.o. male past medical history of MRSA bacteremia, Zaidi, depression, essential hypertension hyperlipidemia nonfatty liver disease, prior history of PE on chronic anticoagulation comes into the hospital for several days of upper respiratory symptoms cough and congestion, hospital course was complicated by metabolic acidosis which turned out to be euglycemic DKA moved to stepdown and started on an insulin drip for 2 days now acidosis has resolved.   Assessment/Plan:   Sepsis secondarily to Multifocal pneumonia He has completed 8-day course of antibiotics. Awaiting skilled nursing facility placement.  Euglycemic DKA/diabetes mellitus type 2: On long-acting insulin per sliding scale, will start him on 3 units with meals. Continue CBGs ACHS.  History of PE: Continue Xarelto.  Hypokalemia: Still low replete orally recheck in the morning.  Essential hypertension: Continue statin.  Number cytopenia The setting of acute illness now resolved.  History of depression: Continue Abilify.  Left buttock stage I RN Pressure Injury Documentation: Pressure Injury 09/11/22 Buttocks Left Deep Tissue Pressure Injury - Purple or maroon localized area of discolored intact skin or blood-filled blister due to damage of underlying soft tissue from pressure and/or shear. red, pink, purple (Active)  09/11/22 1918  Location: Buttocks  Location Orientation: Left  Staging: Deep Tissue Pressure Injury - Purple or maroon localized area of discolored intact skin or blood-filled blister due to damage of underlying soft tissue from pressure and/or shear.  Wound Description (Comments): red, pink, purple  Present on Admission: No  Dressing Type Foam - Lift dressing to assess site every shift 09/19/22 0746     Estimated body mass index is 27.66 kg/m as calculated from the following:   Height as of this encounter: 6\' 1"  (1.854 m).   Weight as of this encounter: 95.1 kg.   DVT prophylaxis: lovenox Family Communication:none Status is: Inpatient Remains inpatient appropriate because: Sepsis and DKA    Code Status:     Code Status Orders  (From admission, onward)           Start     Ordered   09/06/22 1409  Full code  Continuous       Question:  By:  Answer:  Consent: discussion documented in EHR   09/06/22 1411           Code Status History     Date Active Date Inactive Code Status Order ID Comments User Context   09/06/2022 0825 09/06/2022 1411 Full Code [id number]  Bobette Mo, MD ED   02/28/2016 1739 03/01/2016 1755 Full Code [id number]  Deneise Lever, MD Inpatient   11/11/2015 0505 11/17/2015 2143 Full Code [id number]  Lorretta Harp, MD Inpatient      Advance Directive Documentation    Flowsheet Row Most Recent Value  Type of Advance Directive Healthcare Power of Attorney, Living will  Pre-existing out of facility DNR order (yellow form or pink MOST form) --  "MOST" Form in Place? --         IV Access:   Peripheral IV   Procedures and diagnostic studies:   No results found.   Medical Consultants:   None.   Subjective:    [name] sleepy this morning no complaints  Objective:    Vitals:   09/18/22 2036 09/19/22 0148 09/19/22 0436 09/19/22 0440  BP: 132/64 (!) 96/58 116/65  Pulse: 98 81 82   Resp: (!) 22 14 15    Temp: 98.1 F (36.7 C) 98 F (36.7 C) 98.1 F (36.7 C)   TempSrc: Oral Oral Axillary   SpO2: 96% 98% 96%   Weight:    95.1 kg  Height:       SpO2: 96 % O2 Flow Rate (L/min): 2 L/min FiO2 (%): 32 %   Intake/Output Summary (Last 24 hours) at 09/19/2022 0849 Last data filed at 09/19/2022 0844 Gross per 24 hour  Intake 680 ml  Output 2275 ml  Net -1595 ml    Filed Weights   09/16/22 0435 09/17/22 0500  09/19/22 0440  Weight: 96.6 kg 95.5 kg 95.1 kg    Exam: General exam: In no acute distress. Respiratory system: Good air movement and clear to auscultation. Cardiovascular system: S1 & S2 heard, RRR. No JVD. Gastrointestinal system: Abdomen is nondistended, soft and nontender.  Extremities: No pedal edema. Skin: No rashes, lesions or ulcers Psychiatry: Judgement and insight appear normal. Mood & affect appropriate.    Data Reviewed:    Labs: Basic Metabolic Panel: Recent Labs  Lab 09/13/22 0258 09/14/22 0516 09/15/22 0515  NA 137 137 137  K 3.0* 3.0* 3.4*  CL 101 100 97*  CO2 25 27 32  GLUCOSE 152* 167* 177*  BUN 21* 16 14  CREATININE 0.44* 0.45* 0.48*  CALCIUM 8.9 8.8* 8.9  MG 1.7 1.7  --     GFR Estimated Creatinine Clearance: 112.4 mL/min (A) (by C-G formula based on SCr of 0.48 mg/dL (L)). Liver Function Tests: Recent Labs  Lab 09/13/22 0258 09/14/22 0516  AST 109* 47*  ALT 86* 71*  ALKPHOS 70 64  BILITOT 1.3* 1.1  PROT 7.0 6.9  ALBUMIN 2.5* 2.4*    No results for input(s): "LIPASE", "AMYLASE" in the last 168 hours. No results for input(s): "AMMONIA" in the last 168 hours. Coagulation profile No results for input(s): "INR", "PROTIME" in the last 168 hours. COVID-19 Labs  No results for input(s): "DDIMER", "FERRITIN", "LDH", "CRP" in the last 72 hours.  Lab Results  Component Value Date   SARSCOV2NAA NEGATIVE 09/05/2022    CBC: Recent Labs  Lab 09/13/22 0258 09/14/22 0516 09/19/22 0444  WBC 9.4 8.0 8.7  HGB 13.8 13.9 14.0  HCT 41.1 42.3 42.3  MCV 92.2 93.2 93.6  PLT 154 149* 185    Cardiac Enzymes: No results for input(s): "CKTOTAL", "CKMB", "CKMBINDEX", "TROPONINI" in the last 168 hours. BNP (last 3 results) No results for input(s): "PROBNP" in the last 8760 hours. CBG: Recent Labs  Lab 09/18/22 0818 09/18/22 1246 09/18/22 1634 09/18/22 2127 09/19/22 0722  GLUCAP 190* 197* 173* 199* 166*    D-Dimer: No results for  input(s): "DDIMER" in the last 72 hours. Hgb A1c: No results for input(s): "HGBA1C" in the last 72 hours. Lipid Profile: No results for input(s): "CHOL", "HDL", "LDLCALC", "TRIG", "CHOLHDL", "LDLDIRECT" in the last 72 hours. Thyroid function studies: No results for input(s): "TSH", "T4TOTAL", "T3FREE", "THYROIDAB" in the last 72 hours.  Invalid input(s): "FREET3" Anemia work up: No results for input(s): "VITAMINB12", "FOLATE", "FERRITIN", "TIBC", "IRON", "RETICCTPCT" in the last 72 hours. Sepsis Labs: Recent Labs  Lab 09/13/22 0258 09/14/22 0516 09/19/22 0444  WBC 9.4 8.0 8.7    Microbiology No results found for this or any previous visit (from the past 240 hour(s)).    Medications:    amLODipine  10 mg Oral Daily   ARIPiprazole  2 mg Oral Daily  insulin aspart  0-5 Units Subcutaneous QHS   insulin aspart  0-9 Units Subcutaneous TID WC   insulin aspart  3 Units Subcutaneous TID WC   insulin glargine-yfgn  10 Units Subcutaneous BID   losartan  50 mg Oral Daily   metFORMIN  1,000 mg Oral BID WC   metoprolol tartrate  25 mg Oral BID   polyethylene glycol  17 g Oral Daily   rivaroxaban  20 mg Oral Q supper   sodium chloride flush  3 mL Intravenous Q12H   Continuous Infusions:    LOS: 13 days   Marinda Elk  [facility]  09/19/2022, 8:49 AM

## 2022-09-19 NOTE — Progress Notes (Signed)
Physical Therapy Treatment Patient Details Name: Bradley Mcclure MRN: [id number] DOB: 22-Dec-1960 Today's Date: 09/19/2022   History of Present Illness 62 year old male with prior history of MRSA bacteremia, anxiety, depression, mild cognitive disorder, HTN, HLD, DM2, NAFLD, prior PE on chronic anticoagulation, obesity comes to the hospital with several days of URI type symptoms, cough, chest congestion and eventually passing out.  In the ED he was found to have right lower lobe pneumonia, he was started on antibiotics and admitted to the hospital.  Hospital course complicated by worsening metabolic acidosis which did not have a clear etiology initially, however it was found to be due to euglycemic DKA.  He was moved to stepdown, maintained on insulin drip for 2 days with improvement in his acidosis.  Currently on 4 east.    PT Comments     Pt admitted with above diagnosis.  Pt currently with functional limitations due to the deficits listed below (see PT Problem List). Pt in bed when PT arrived, following SLP intervention. Pt is more alert and communicative, pt is able to follow directions with increased time for motor processing and planning. Pt denies pain, dizziness and SOB with pt on 3 L/min t/o intervention and desaturation with exertion. At rest 93% on 3 L/min. Pt required min A for trunk for supine to sit with HOB elevated. Pt required min guard and cues for STS from EOB. Gait assessed with RW, min guard and cues, noted B toe out and slow cadence for 80 feet. Pt desaturated on 3 L/min to 85% and required 31s to recover once seated with cues for breathing strategies. Pt left seated in recliner, call needs in place on 3 L/min and O2 saturation at 93%. PT is under impression pt will d/c to SNF today. Pt will benefit from acute skilled PT to increase their independence and safety with mobility to allow discharge.     Recommendations for follow up therapy are one component of a multi-disciplinary  discharge planning process, led by the attending physician.  Recommendations may be updated based on patient status, additional functional criteria and insurance authorization.  Follow Up Recommendations  Can patient physically be transported by private vehicle: Yes    Assistance Recommended at Discharge    Patient can return home with the following Assistance with cooking/housework;Assist for transportation;Help with stairs or ramp for entrance;A little help with walking and/or transfers;A little help with bathing/dressing/bathroom   Equipment Recommendations  Rolling walker (2 wheels)    Recommendations for Other Services       Precautions / Restrictions Precautions Precautions: Fall Precaution Comments: monitor sats Restrictions Weight Bearing Restrictions: No     Mobility  Bed Mobility Overal bed mobility: Needs Assistance Bed Mobility: Supine to Sit     Supine to sit: Min assist     General bed mobility comments: min A for trunk with supine to sit, HOB elevated and cues for scooting to EOB    Transfers Overall transfer level: Needs assistance Equipment used: Rolling walker (2 wheels) Transfers: Sit to/from Stand Sit to Stand: From elevated surface, Min guard           General transfer comment: pt able to follow directions for use of B UEs to push to stand    Ambulation/Gait Ambulation/Gait assistance: Min guard Gait Distance (Feet): 80 Feet Assistive device: Rolling walker (2 wheels) Gait Pattern/deviations: Step-through pattern Gait velocity: decreased     General Gait Details: B toe out and limited foot clearance   Stairs  Wheelchair Mobility    Modified Rankin (Stroke Patients Only)       Balance Overall balance assessment: Needs assistance Sitting-balance support: Feet supported Sitting balance-Leahy Scale: Fair     Standing balance support: Bilateral upper extremity supported, During functional activity, Reliant on  assistive device for balance Standing balance-Leahy Scale: Poor                              Cognition Arousal/Alertness: Awake/alert Behavior During Therapy: WFL for tasks assessed/performed Overall Cognitive Status: Impaired/Different from baseline Area of Impairment: Following commands, Problem solving, Memory                 Orientation Level: Situation Current Attention Level: Focused Memory: Decreased short-term memory Following Commands: Follows one step commands with increased time   Awareness: Intellectual Problem Solving: Slow processing General Comments: increased time for motor processing and planning, however grossly improved from prior PT sessions, pt is able to comunicate needs and follow directions        Exercises      General Comments        Pertinent Vitals/Pain Pain Assessment Pain Assessment: No/denies pain    Home Living                          Prior Function            PT Goals (current goals can now be found in the care plan section) Acute Rehab PT Goals Patient Stated Goal: unable to state PT Goal Formulation: Patient unable to participate in goal setting Time For Goal Achievement: 09/27/22 Potential to Achieve Goals: Good Progress towards PT goals: Progressing toward goals    Frequency    Min 1X/week      PT Plan Current plan remains appropriate    Co-evaluation              AM-PAC PT "6 Clicks" Mobility   Outcome Measure  Help needed turning from your back to your side while in a flat bed without using bedrails?: A Little Help needed moving from lying on your back to sitting on the side of a flat bed without using bedrails?: A Little Help needed moving to and from a bed to a chair (including a wheelchair)?: A Little Help needed standing up from a chair using your arms (e.g., wheelchair or bedside chair)?: A Little Help needed to walk in hospital room?: A Little Help needed climbing 3-5  steps with a railing? : A Lot 6 Click Score: 17    End of Session Equipment Utilized During Treatment: Gait belt Activity Tolerance: Patient tolerated treatment well Patient left: in chair;with call bell/phone within reach;with chair alarm set Nurse Communication: Mobility status PT Visit Diagnosis: Unsteadiness on feet (R26.81);Muscle weakness (generalized) (M62.81);Other abnormalities of gait and mobility (R26.89);Difficulty in walking, not elsewhere classified (R26.2)     Time: 1478-2956 PT Time Calculation (min) (ACUTE ONLY): 16 min  Charges:  $Gait Training: 8-22 mins                     Johnny Bridge, PT Acute Rehab    Jacqualyn Posey 09/19/2022, 12:16 PM

## 2022-09-19 NOTE — [place] FL2 (Signed)
MEDICAID FL2 LEVEL OF CARE FORM     IDENTIFICATION  Patient Name: Bradley Mcclure Birthdate: March 12, 1961 Sex: male Admission Date (Current Location): 09/05/2022  [facility] and [place] Number:  Producer, television/film/video and Address:  [facility],  [address], [place] [address]      Provider Number: [id number]  Attending Physician Name and Address:  David Stall, Darin Engels, MD  Relative Name and Phone Number:  Loraine Leriche Brody(friend)[phone]    Current Level of Care: Hospital Recommended Level of Care: Skilled Nursing Facility Prior Approval Number:    Date Approved/Denied:   PASRR Number: Pending  Discharge Plan: SNF    Current Diagnoses: Patient Active Problem List   Diagnosis Date Noted   Multifocal pneumonia 09/06/2022   Hyperlipidemia 09/06/2022   Generalized anxiety disorder 09/06/2022   Major depressive disorder 09/06/2022   Type 2 diabetes mellitus with diabetic neuropathy, unspecified (HCC) 09/06/2022   Mild cognitive disorder 09/06/2022   History of retinal detachment 09/06/2022   Essential hypertension 09/06/2022   Type 2 diabetes mellitus (HCC) 09/06/2022   Sepsis due to Staphylococcus aureus (HCC) 03/01/2016   Cellulitis of left lower extremity    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   Cellulitis and abscess 12/24/2015   Balanitis 12/24/2015   Uncontrolled type 2 diabetes mellitus with hyperglycemia, with long-term current use of insulin (HCC)    Bacteremia    Morbid obesity due to excess calories (HCC)    Cellulitis of abdominal wall 11/11/2015   Sepsis (HCC) 11/11/2015   Rash 11/11/2015   Hypertension    Diabetes mellitus without complication (HCC)    Candida-induced panniculitis    GERD (gastroesophageal reflux disease) 04/19/2015   Pulmonary embolism (HCC) 03/21/2013   Non-alcoholic fatty liver disease 04/19/2011    Orientation RESPIRATION BLADDER Height & Weight     Self, Time, Situation, Place  O2 Continent Weight: 95.1  kg Height:  6\' 1"  (185.4 cm)  BEHAVIORAL SYMPTOMS/MOOD NEUROLOGICAL BOWEL NUTRITION STATUS      Continent Diet (Dys 3 diet)  AMBULATORY STATUS COMMUNICATION OF NEEDS Skin   Limited Assist Verbally Normal                       Personal Care Assistance Level of Assistance              Functional Limitations Info  Sight, Hearing, Speech Sight Info: Impaired (eyeglasses) Hearing Info: Adequate Speech Info: Adequate    SPECIAL CARE FACTORS FREQUENCY  PT (By licensed PT), OT (By licensed OT)     PT Frequency: 5x week OT Frequency: 5x week            Contractures Contractures Info: Not present    Additional Factors Info  Code Status, Allergies Code Status Info: Full Allergies Info: NKA           Current Medications (09/19/2022):  This is the current hospital active medication list Current Facility-Administered Medications  Medication Dose Route Frequency Provider Last Rate Last Admin   acetaminophen (TYLENOL) tablet 650 mg  650 mg Oral Q6H PRN Bobette Mo, MD   650 mg at 09/18/22 2121   albuterol (PROVENTIL) (2.5 MG/3ML) 0.083% nebulizer solution 2.5 mg  2.5 mg Nebulization Q4H PRN Bobette Mo, MD   2.5 mg at 09/09/22 0521   amLODipine (NORVASC) tablet 10 mg  10 mg Oral Daily Leatha Gilding, MD   10 mg at 09/19/22 0811   ARIPiprazole (ABILIFY) tablet 2 mg  2 mg Oral  Daily Bobette Mo, MD   2 mg at 09/19/22 0811   dextrose 50 % solution 0-50 mL  0-50 mL Intravenous PRN Leatha Gilding, MD       guaiFENesin (ROBITUSSIN) 100 MG/5ML liquid 5 mL  5 mL Oral Q4H PRN Bobette Mo, MD   5 mL at 09/18/22 2121   hydrALAZINE (APRESOLINE) injection 5 mg  5 mg Intravenous Q4H PRN Leatha Gilding, MD   5 mg at 09/12/22 1114   insulin aspart (novoLOG) injection 0-5 Units  0-5 Units Subcutaneous QHS Leatha Gilding, MD       insulin aspart (novoLOG) injection 0-9 Units  0-9 Units Subcutaneous TID WC Leatha Gilding, MD   2 Units at 09/19/22  0810   insulin aspart (novoLOG) injection 3 Units  3 Units Subcutaneous TID WC Marinda Elk, MD   3 Units at 09/19/22 0810   insulin glargine-yfgn (SEMGLEE) injection 10 Units  10 Units Subcutaneous BID Marinda Elk, MD   10 Units at 09/19/22 0946   losartan (COZAAR) tablet 50 mg  50 mg Oral Daily Bobette Mo, MD   50 mg at 09/19/22 0865   metFORMIN (GLUCOPHAGE) tablet 1,000 mg  1,000 mg Oral BID WC Marinda Elk, MD   1,000 mg at 09/19/22 0810   metoprolol tartrate (LOPRESSOR) injection 5 mg  5 mg Intravenous Q6H PRN Leatha Gilding, MD   5 mg at 09/08/22 1824   metoprolol tartrate (LOPRESSOR) tablet 25 mg  25 mg Oral BID Leatha Gilding, MD   25 mg at 09/19/22 0811   ondansetron (ZOFRAN) injection 4 mg  4 mg Intravenous Q6H PRN Bobette Mo, MD       Oral care mouth rinse  15 mL Mouth Rinse PRN Bobette Mo, MD       polyethylene glycol [facility] Hal) packet 17 g  17 g Oral Daily Luiz Iron, NP   17 g at 09/18/22 7846   rivaroxaban (XARELTO) tablet 20 mg  20 mg Oral Q supper Bobette Mo, MD   20 mg at 09/18/22 1530   sodium chloride (OCEAN) 0.65 % nasal spray 1 spray  1 spray Each Nare PRN Leatha Gilding, MD   1 spray at 09/09/22 0555   sodium chloride flush (NS) 0.9 % injection 3 mL  3 mL Intravenous Q12H Bobette Mo, MD   3 mL at 09/19/22 9629     Discharge Medications: Please see discharge summary for a list of discharge medications.  Relevant Imaging Results:  Relevant Lab Results:   Additional Information medicare A&B [id number]  Lanier Clam, RN

## 2022-09-19 NOTE — Discharge Summary (Signed)
Physician Discharge Summary  Bradley Mcclure ZOX:[id number] DOB: 04-29-1960 DOA: 09/05/2022  PCP: [facility]  Admit date: 09/05/2022 Discharge date: 09/19/2022  Admitted From: SNF Disposition:  SNF  Recommendations for Outpatient Follow-up:  Follow up with PCP in 1-2 weeks Please obtain BMP/CBC in one week   Home Health:No Equipment/Devices:None  Discharge Condition:Stable CODE STATUS:fULL Diet recommendation: Heart Healthy   Brief/Interim Summary: 62 y.o. male past medical history of MRSA bacteremia, anxiety, depression, essential hypertension hyperlipidemia nonfatty liver disease, prior history of PE on chronic anticoagulation comes into the hospital for several days of upper respiratory symptoms cough and congestion, hospital course was complicated by metabolic acidosis which turned out to be euglycemic DKA moved to stepdown and started on an insulin drip for 2 days now acidosis has resolved.   Discharge Diagnoses:  Principal Problem:   Multifocal pneumonia Active Problems:   Hyperlipidemia   Major depressive disorder   Pulmonary embolism (HCC)   Essential hypertension   Type 2 diabetes mellitus (HCC)   GERD (gastroesophageal reflux disease)  Sepsis secondary to multifocal pneumonia: Started empirically on IV Rocephin and azithromycin due to work of breathing and DKA transferred to the ICU. Antibiotics were broadened to vancomycin and cefepime he completed 8-day course. Physical therapy evaluated the patient, he will need to go back to skilled nursing facility. Awaiting skilled nursing facility placement.  Euglycemic DKA/diabetes mellitus type 2: Oral hypoglycemic agents were held admission was started on IV insulin then transition to long-acting insulin plus sliding scale. He will continue his metformin and Lantus as an outpatient.  Hypokalemia: It was repleted orally.  History of PE: Seen and Xarelto.  Essential hypertension: Continue metoprolol  and losartan.  Thrombocytopenia: Likely due to infection now resolved.  History of depression: Continue Abilify.  Left buttock stage I pressure ulcer present on admission:  Discharge Instructions  Discharge Instructions     Diet - low sodium heart healthy   Complete by: As directed    Increase activity slowly   Complete by: As directed    No wound care   Complete by: As directed       Allergies as of 09/19/2022   No Known Allergies      Medication List     STOP taking these medications    empagliflozin 25 MG Tabs tablet Commonly known as: JARDIANCE   HYDROcodone-acetaminophen 5-325 MG tablet Commonly known as: NORCO/VICODIN   insulin aspart 100 UNIT/ML injection Commonly known as: novoLOG   insulin glargine 100 UNIT/ML injection Commonly known as: LANTUS   [employer] 10"x144" Shee   senna 8.6 MG Tabs tablet Commonly known as: SENOKOT   silver sulfADIAZINE 1 % cream Commonly known as: SILVADENE   sulfamethoxazole-trimethoprim 800-160 MG tablet Commonly known as: BACTRIM DS       TAKE these medications    acetaminophen 325 MG tablet Commonly known as: TYLENOL Take 650 mg by mouth every 6 (six) hours as needed for mild pain.   ARIPiprazole 2 MG tablet Commonly known as: ABILIFY Take 2 mg by mouth daily.   FLUoxetine 20 MG capsule Commonly known as: PROZAC Take 20 mg by mouth 2 (two) times daily as needed (anxiety/depression).   losartan 50 MG tablet Commonly known as: COZAAR Take 50 mg by mouth daily.   metFORMIN 500 MG tablet Commonly known as: GLUCOPHAGE Take 1,000 mg by mouth 2 (two) times daily with a meal.   metoprolol tartrate 25 MG tablet Commonly known as: LOPRESSOR Take 1 tablet (25 mg  total) by mouth 2 (two) times daily.   mupirocin ointment 2 % Commonly known as: BACTROBAN Place 1 application into the nose 2 (two) times daily.   polyethylene glycol 17 g packet Commonly known as: MIRALAX / GLYCOLAX Take 17 g by  mouth daily.   rivaroxaban 20 MG Tabs tablet Commonly known as: XARELTO Take 20 mg by mouth daily with supper.   Semaglutide (2 MG/DOSE) 8 MG/3ML Sopn Inject 2 mg into the skin every 7 (seven) days.        No Known Allergies  Consultations: No complaints   Procedures/Studies: CT CHEST WO CONTRAST  Result Date: 09/10/2022 CLINICAL DATA:  Pneumonia, complications suspected. EXAM: CT CHEST WITHOUT CONTRAST TECHNIQUE: Multidetector CT imaging of the chest was performed following the standard protocol without IV contrast. RADIATION DOSE REDUCTION: This exam was performed according to the departmental dose-optimization program which includes automated exposure control, adjustment of the mA and/or kV according to patient size and/or use of iterative reconstruction technique. COMPARISON:  Sep 06, 2022 FINDINGS: Cardiovascular: Calcific atherosclerotic disease of the coronary arteries. Normal heart size. No pericardial effusion. Minimal calcific atherosclerotic disease of the aorta. Mediastinum/Nodes: Mildly enlarged mediastinal lymph nodes the largest measuring 1 cm in short axis, likely reactive. Lungs/Pleura: Worsening of bilateral airspace consolidation with confluent consolidation in the right upper lobe and areas of patchy consolidation throughout all of the areas of the lungs. Interval development of small right pleural effusion and tiny left pleural effusion. Upper Abdomen: No acute abnormality. Musculoskeletal: No chest wall mass or suspicious bone lesions identified. IMPRESSION: 1. Worsening of bilateral airspace consolidation with confluent consolidation in the right upper lobe and areas of patchy consolidation throughout all of the bilateral lung parenchyma. 2. Interval development of small right pleural effusion and tiny left pleural effusion. 3. Mildly enlarged mediastinal lymph nodes, likely reactive. 4. Calcific atherosclerotic disease of the coronary arteries. 5. Aortic atherosclerosis.  Aortic Atherosclerosis (ICD10-I70.0). Electronically Signed   By: Ted Mcalpine M.D.   On: 09/10/2022 20:23   DG Chest Port 1 View  Result Date: 09/08/2022 CLINICAL DATA:  Shortness of breath. EXAM: PORTABLE CHEST 1 VIEW COMPARISON:  09/05/2022, 09/06/2022. FINDINGS: Heart is enlarged and the mediastinal contour stable. The pulmonary vasculature is distended. Patchy airspace disease is noted in the mid to lower left lung. There is consolidation in the right upper lobe. No effusion or pneumothorax. No acute osseous abnormality. IMPRESSION: 1. Cardiomegaly with mildly distended pulmonary vasculature. 2. Increased right upper lobe consolidation with patchy airspace disease in the mid to lower left lung field, suspicious for pneumonia. Electronically Signed   By: Thornell Sartorius M.D.   On: 09/08/2022 23:08   ECHOCARDIOGRAM COMPLETE  Result Date: 09/07/2022    ECHOCARDIOGRAM REPORT   Patient Name:   Bradley Mcclure Date of Exam: 09/07/2022 Medical Rec #:  [id number]          Height:       73.0 in Accession #:    [id number]         Weight:       229.3 lb Date of Birth:  12/10/1960          BSA:          2.280 m Patient Age:    61 years           BP:           137/74 mmHg Patient Gender: M  HR:           100 bpm. Exam Location:  Inpatient Procedure: 2D Echo, Color Doppler, Cardiac Doppler and Intracardiac            Opacification Agent Indications:    Syncope, Pulmonary Embolus  History:        Patient has prior history of Echocardiogram examinations, most                 recent 11/17/2015. Pulmonary Embolus, Signs/Symptoms:Syncope; Risk                 Factors:Dyslipidemia, Diabetes and Hypertension.  Sonographer:    Milbert Coulter Referring Phys: [id number] DAVID MANUEL ORTIZ  Sonographer Comments: Suboptimal apical window and no subcostal window. IMPRESSIONS  1. Left ventricular ejection fraction, by estimation, is 60 to 65%. The left ventricle has normal function. The left ventricle has no  regional wall motion abnormalities. There is moderate left ventricular hypertrophy. Left ventricular diastolic parameters are indeterminate.  2. Right ventricular systolic function is normal. The right ventricular size is normal.  3. The mitral valve is normal in structure. No evidence of mitral valve regurgitation. No evidence of mitral stenosis.  4. The aortic valve is normal in structure. Aortic valve regurgitation is not visualized. No aortic stenosis is present.  5. The inferior vena cava is normal in size with greater than 50% respiratory variability, suggesting right atrial pressure of 3 mmHg. FINDINGS  Left Ventricle: Left ventricular ejection fraction, by estimation, is 60 to 65%. The left ventricle has normal function. The left ventricle has no regional wall motion abnormalities. Definity contrast agent was given IV to delineate the left ventricular  endocardial borders. The left ventricular internal cavity size was normal in size. There is moderate left ventricular hypertrophy. Left ventricular diastolic parameters are indeterminate. Right Ventricle: The right ventricular size is normal. No increase in right ventricular wall thickness. Right ventricular systolic function is normal. Left Atrium: Left atrial size was normal in size. Right Atrium: Right atrial size was normal in size. Pericardium: There is no evidence of pericardial effusion. Mitral Valve: The mitral valve is normal in structure. No evidence of mitral valve regurgitation. No evidence of mitral valve stenosis. Tricuspid Valve: The tricuspid valve is normal in structure. Tricuspid valve regurgitation is not demonstrated. No evidence of tricuspid stenosis. Aortic Valve: The aortic valve is normal in structure. Aortic valve regurgitation is not visualized. No aortic stenosis is present. Aortic valve mean gradient measures 4.0 mmHg. Aortic valve peak gradient measures 6.8 mmHg. Aortic valve area, by VTI measures 3.36 cm. Pulmonic Valve: The  pulmonic valve was normal in structure. Pulmonic valve regurgitation is not visualized. No evidence of pulmonic stenosis. Aorta: The aortic root is normal in size and structure. Venous: The inferior vena cava is normal in size with greater than 50% respiratory variability, suggesting right atrial pressure of 3 mmHg. IAS/Shunts: No atrial level shunt detected by color flow Doppler.  LEFT VENTRICLE PLAX 2D LVIDd:         4.30 cm   Diastology LVIDs:         2.70 cm   LV e' medial:    8.59 cm/s LV PW:         1.40 cm   LV E/e' medial:  10.5 LV IVS:        1.30 cm   LV e' lateral:   11.10 cm/s LVOT diam:     2.50 cm   LV E/e' lateral: 8.1 LV SV:  69 LV SV Index:   30 LVOT Area:     4.91 cm  RIGHT VENTRICLE RV S prime:     15.90 cm/s TAPSE (M-mode): 2.4 cm LEFT ATRIUM         Index LA diam:    4.00 cm 1.75 cm/m  AORTIC VALVE AV Area (Vmax):    3.33 cm AV Area (Vmean):   3.24 cm AV Area (VTI):     3.36 cm AV Vmax:           130.00 cm/s AV Vmean:          86.900 cm/s AV VTI:            0.206 m AV Peak Grad:      6.8 mmHg AV Mean Grad:      4.0 mmHg LVOT Vmax:         88.30 cm/s LVOT Vmean:        57.400 cm/s LVOT VTI:          0.141 m LVOT/AV VTI ratio: 0.68  AORTA Ao Root diam: 3.50 cm Ao Asc diam:  3.50 cm MITRAL VALVE MV Area (PHT): 4.41 cm    SHUNTS MV Decel Time: 172 msec    Systemic VTI:  0.14 m MV E velocity: 90.40 cm/s  Systemic Diam: 2.50 cm MV A velocity: 83.10 cm/s MV E/A ratio:  1.09 Donato Schultz MD Electronically signed by Donato Schultz MD Signature Date/Time: 09/07/2022/10:36:58 AM    Final    CT Angio Chest PE W/Cm &/Or Wo Cm  Result Date: 09/06/2022 CLINICAL DATA:  Pulmonary embolism (PE) suspected, high prob, syncope, tachycardia EXAM: CT ANGIOGRAPHY CHEST WITH CONTRAST TECHNIQUE: Multidetector CT imaging of the chest was performed using the standard protocol during bolus administration of intravenous contrast. Multiplanar CT image reconstructions and MIPs were obtained to evaluate the vascular  anatomy. RADIATION DOSE REDUCTION: This exam was performed according to the departmental dose-optimization program which includes automated exposure control, adjustment of the mA and/or kV according to patient size and/or use of iterative reconstruction technique. CONTRAST:  75mL OMNIPAQUE IOHEXOL 350 MG/ML SOLN COMPARISON:  None Available. FINDINGS: Cardiovascular: There is adequate opacification the pulmonary arterial tree. No intraluminal filling defect identified to suggest acute pulmonary embolism. The central pulmonary arteries are mildly enlarged in keeping with changes of pulmonary arterial hypertension. Moderate atherosclerotic calcification within the left anterior descending coronary artery. Global cardiac size within normal limits. No pericardial effusion. Minimal atherosclerotic calcification within the thoracic aorta. No aortic aneurysm. Mediastinum/Nodes: Shotty mediastinal and bilateral hilar adenopathy is nonspecific, likely reactive in nature. No frankly pathologic thoracic adenopathy identified. The esophagus is unremarkable. Lungs/Pleura: Multifocal airspace infiltrate is seen predominantly within the posterior right upper lobe but also scattered within the left lower lobe and lingula most in keeping with multifocal pneumonia in the acute setting. Associated bronchial wall thickening is present in keeping with airway inflammation. No pneumothorax or pleural effusion. No central obstructing lesion. Upper Abdomen: No acute abnormality. Musculoskeletal: No acute bone abnormality. No lytic or blastic bone lesion. Review of the MIP images confirms the above findings. IMPRESSION: 1. No pulmonary embolism. 2. Multifocal airspace infiltrate most in keeping with multifocal pneumonia in the acute setting. Associated bronchial wall thickening in keeping with airway inflammation. 3. Moderate coronary artery calcification. 4. Morphologic changes in keeping with pulmonary arterial hypertension. 5.  Aortic  Atherosclerosis (ICD10-I70.0). Electronically Signed   By: Helyn Numbers M.D.   On: 09/06/2022 01:32   CT Head Wo Contrast  Result  Date: 09/06/2022 CLINICAL DATA:  Trauma. EXAM: CT HEAD WITHOUT CONTRAST CT CERVICAL SPINE WITHOUT CONTRAST TECHNIQUE: Multidetector CT imaging of the head and cervical spine was performed following the standard protocol without intravenous contrast. Multiplanar CT image reconstructions of the cervical spine were also generated. RADIATION DOSE REDUCTION: This exam was performed according to the departmental dose-optimization program which includes automated exposure control, adjustment of the mA and/or kV according to patient size and/or use of iterative reconstruction technique. COMPARISON:  None Available. FINDINGS: CT HEAD FINDINGS Brain: Mild age-related atrophy and chronic microvascular ischemic changes. There is no acute intracranial hemorrhage. No mass effect or midline shift. No extra-axial fluid collection. Vascular: No hyperdense vessel or unexpected calcification. Skull: Normal. Negative for fracture or focal lesion. Sinuses/Orbits: Mild mucoperiosteal thickening of paranasal sinuses. No air-fluid level. The mastoid air cells are clear. Right scleral banding. Other: None CT CERVICAL SPINE FINDINGS Alignment: No acute subluxation. Skull base and vertebrae: No acute fracture. Soft tissues and spinal canal: No prevertebral fluid or swelling. No visible canal hematoma. Disc levels:  No acute findings.  Degenerative changes. Upper chest: Negative. Other: Bilateral carotid bulb calcified plaques. IMPRESSION: 1. No acute intracranial pathology. Mild age-related atrophy and chronic microvascular ischemic changes. 2. No acute/traumatic cervical spine pathology. Electronically Signed   By: Elgie Collard M.D.   On: 09/06/2022 01:30   CT Cervical Spine Wo Contrast  Result Date: 09/06/2022 CLINICAL DATA:  Trauma. EXAM: CT HEAD WITHOUT CONTRAST CT CERVICAL SPINE WITHOUT CONTRAST  TECHNIQUE: Multidetector CT imaging of the head and cervical spine was performed following the standard protocol without intravenous contrast. Multiplanar CT image reconstructions of the cervical spine were also generated. RADIATION DOSE REDUCTION: This exam was performed according to the departmental dose-optimization program which includes automated exposure control, adjustment of the mA and/or kV according to patient size and/or use of iterative reconstruction technique. COMPARISON:  None Available. FINDINGS: CT HEAD FINDINGS Brain: Mild age-related atrophy and chronic microvascular ischemic changes. There is no acute intracranial hemorrhage. No mass effect or midline shift. No extra-axial fluid collection. Vascular: No hyperdense vessel or unexpected calcification. Skull: Normal. Negative for fracture or focal lesion. Sinuses/Orbits: Mild mucoperiosteal thickening of paranasal sinuses. No air-fluid level. The mastoid air cells are clear. Right scleral banding. Other: None CT CERVICAL SPINE FINDINGS Alignment: No acute subluxation. Skull base and vertebrae: No acute fracture. Soft tissues and spinal canal: No prevertebral fluid or swelling. No visible canal hematoma. Disc levels:  No acute findings.  Degenerative changes. Upper chest: Negative. Other: Bilateral carotid bulb calcified plaques. IMPRESSION: 1. No acute intracranial pathology. Mild age-related atrophy and chronic microvascular ischemic changes. 2. No acute/traumatic cervical spine pathology. Electronically Signed   By: Elgie Collard M.D.   On: 09/06/2022 01:30   DG Chest 2 View  Result Date: 09/05/2022 CLINICAL DATA:  Weakness, confusion, sore throat, fever EXAM: CHEST - 2 VIEW COMPARISON:  Chest radiograph 11/17/2015 FINDINGS: Low lung volumes accentuate cardiomediastinal silhouette and pulmonary vascularity. Bilateral interstitial coarsening likely due to low lung volumes. There is focal hazy opacity in the right lower lobe consistent with  pneumonia. No pleural effusion or pneumothorax. No displaced rib fractures. IMPRESSION: Right lower lobe pneumonia. Electronically Signed   By: Minerva Fester M.D.   On: 09/05/2022 23:50     Subjective: No complaints  Discharge Exam: Vitals:   09/19/22 0436 09/19/22 0942  BP: 116/65 (!) 90/54  Pulse: 82 84  Resp: 15 16  Temp: 98.1 F (36.7 C) 97.8 F (36.6 C)  SpO2: 96% 96%   Vitals:   09/19/22 0148 09/19/22 0436 09/19/22 0440 09/19/22 0942  BP: (!) 96/58 116/65  (!) 90/54  Pulse: 81 82  84  Resp: 14 15  16   Temp: 98 F (36.7 C) 98.1 F (36.7 C)  97.8 F (36.6 C)  TempSrc: Oral Axillary  Oral  SpO2: 98% 96%  96%  Weight:   95.1 kg   Height:        General: Pt is alert, awake, not in acute distress Cardiovascular: RRR, S1/S2 +, no rubs, no gallops Respiratory: CTA bilaterally, no wheezing, no rhonchi Abdominal: Soft, NT, ND, bowel sounds + Extremities: no edema, no cyanosis    The results of significant diagnostics from this hospitalization (including imaging, microbiology, ancillary and laboratory) are listed below for reference.     Microbiology: No results found for this or any previous visit (from the past 240 hour(s)).    Labs: BNP (last 3 results) Recent Labs    09/05/22 0059  BNP 61.7   Basic Metabolic Panel: Recent Labs  Lab 09/13/22 0258 09/14/22 0516 09/15/22 0515  NA 137 137 137  K 3.0* 3.0* 3.4*  CL 101 100 97*  CO2 25 27 32  GLUCOSE 152* 167* 177*  BUN 21* 16 14  CREATININE 0.44* 0.45* 0.48*  CALCIUM 8.9 8.8* 8.9  MG 1.7 1.7  --    Liver Function Tests: Recent Labs  Lab 09/13/22 0258 09/14/22 0516  AST 109* 47*  ALT 86* 71*  ALKPHOS 70 64  BILITOT 1.3* 1.1  PROT 7.0 6.9  ALBUMIN 2.5* 2.4*   No results for input(s): "LIPASE", "AMYLASE" in the last 168 hours. No results for input(s): "AMMONIA" in the last 168 hours. CBC: Recent Labs  Lab 09/13/22 0258 09/14/22 0516 09/19/22 0444  WBC 9.4 8.0 8.7  HGB 13.8 13.9 14.0   HCT 41.1 42.3 42.3  MCV 92.2 93.2 93.6  PLT 154 149* 185   Cardiac Enzymes: No results for input(s): "CKTOTAL", "CKMB", "CKMBINDEX", "TROPONINI" in the last 168 hours. BNP: Invalid input(s): "POCBNP" CBG: Recent Labs  Lab 09/18/22 1246 09/18/22 1634 09/18/22 2127 09/19/22 0722 09/19/22 1137  GLUCAP 197* 173* 199* 166* 212*   D-Dimer No results for input(s): "DDIMER" in the last 72 hours. Hgb A1c No results for input(s): "HGBA1C" in the last 72 hours. Lipid Profile No results for input(s): "CHOL", "HDL", "LDLCALC", "TRIG", "CHOLHDL", "LDLDIRECT" in the last 72 hours. Thyroid function studies No results for input(s): "TSH", "T4TOTAL", "T3FREE", "THYROIDAB" in the last 72 hours.  Invalid input(s): "FREET3" Anemia work up No results for input(s): "VITAMINB12", "FOLATE", "FERRITIN", "TIBC", "IRON", "RETICCTPCT" in the last 72 hours. Urinalysis    Component Value Date/Time   COLORURINE STRAW (A) 09/05/2022 2327   APPEARANCEUR CLEAR 09/05/2022 2327   LABSPEC 1.033 (H) 09/05/2022 2327   PHURINE 5.0 09/05/2022 2327   GLUCOSEU >=500 (A) 09/05/2022 2327   HGBUR MODERATE (A) 09/05/2022 2327   BILIRUBINUR NEGATIVE 09/05/2022 2327   KETONESUR 20 (A) 09/05/2022 2327   PROTEINUR 30 (A) 09/05/2022 2327   NITRITE NEGATIVE 09/05/2022 2327   LEUKOCYTESUR NEGATIVE 09/05/2022 2327   Sepsis Labs Recent Labs  Lab 09/13/22 0258 09/14/22 0516 09/19/22 0444  WBC 9.4 8.0 8.7   Microbiology No results found for this or any previous visit (from the past 240 hour(s)).   SIGNED:   Marinda Elk, MD  [facility] 09/19/2022, 11:39 AM Pager   If 7PM-7AM, please contact night-coverage [email or web address] Password [email or web address]

## 2022-09-19 NOTE — TOC Transition Note (Signed)
Transition of Care [facility]) - CM/SW Discharge Note   Patient Details  Name: Bradley Mcclure MRN: [id number] Date of Birth: 02/09/1961  Transition of [facility] CM/SW Contact:  Lanier Clam, RN Phone Number: 09/19/2022, 11:47 AM   Clinical Narrative:  Awaiting [facility] to confirm if able to accept with medicare insurance after verification. Patient in agreement to go to [facility].MD notified to do d/c summary.     Final next level of care: Skilled Nursing Facility Barriers to Discharge: No Barriers Identified   Patient Goals and CMS Choice CMS Medicare.gov Compare Post Acute Care list provided to:: Patient Choice offered to / list presented to : Patient  Discharge Placement                Patient chooses bed at: [facility] Patient to be transferred to facility by: [facility] Name of family member notified: Patient notified per request Patient and family notified of of transfer: 09/19/22  Discharge Plan and Services Additional resources added to the After Visit Summary for       Post Acute Care Choice: Skilled Nursing Facility                               Social Determinants of Health (SDOH) Interventions SDOH Screenings   Food Insecurity: No Food Insecurity (09/06/2022)  Housing: Low Risk  (09/06/2022)  Transportation Needs: No Transportation Needs (09/06/2022)  Utilities: Not At Risk (09/06/2022)  Tobacco Use: Low Risk  (09/06/2022)     Readmission Risk Interventions     No data to display

## 2022-09-19 NOTE — TOC Transition Note (Addendum)
Transition of Care [facility]) - CM/SW Discharge Note   Patient Details  Name: [facility] MRN: [id number] Date of Birth: [birth year]  Transition [facility]) CM/SW Contact:  Lanier Clam, RN Phone Number: 09/19/2022, 2:05 PM   Clinical Narrative: received Berkley Harvey for [facility] through Madelin Rear admitting rep assistance-Cheers for her. Patient has [employer] medicare#[id number] eff 04/18/21,auth GM#[id number],DGUY Berkley Harvey QIH#[id number] till 6/5. [employer] rep Kia accepted going to rm#[id number],report tel#[phone]. [facility] called. No further CM needs. [phone] E.    Final next level of care: Skilled Nursing Facility Barriers to Discharge: No Barriers Identified   Patient Goals and CMS Choice CMS Medicare.gov Compare Post Acute Care list provided to:: Patient Choice offered to / list presented to : Patient  Discharge Placement                Patient chooses bed at: [facility] Patient to be transferred to facility by: [facility] Name of family member notified: Patient notified per request Patient and family notified of of transfer: 09/19/22  Discharge Plan and Services Additional resources added to the After Visit Summary for       Post Acute Care Choice: Skilled Nursing Facility                               Social Determinants of Health (SDOH) Interventions SDOH Screenings   Food Insecurity: No Food Insecurity (09/06/2022)  Housing: Low Risk  (09/06/2022)  Transportation Needs: No Transportation Needs (09/06/2022)  Utilities: Not At Risk (09/06/2022)  Tobacco Use: Low Risk  (09/06/2022)     Readmission Risk Interventions     No data to display

## 2023-08-24 ENCOUNTER — Emergency Department (HOSPITAL_COMMUNITY)

## 2023-08-24 ENCOUNTER — Inpatient Hospital Stay (HOSPITAL_COMMUNITY)
Admission: EM | Admit: 2023-08-24 | Discharge: 2023-09-14 | DRG: 871 | Disposition: A | Discharge status: Home Health | Attending: Internal Medicine | Admitting: Internal Medicine

## 2023-08-24 DIAGNOSIS — A419 Sepsis, unspecified organism: Principal | ICD-10-CM | POA: Diagnosis present

## 2023-08-24 DIAGNOSIS — Z1152 Encounter for screening for COVID-19: Secondary | ICD-10-CM

## 2023-08-24 DIAGNOSIS — E876 Hypokalemia: Secondary | ICD-10-CM | POA: Diagnosis present

## 2023-08-24 DIAGNOSIS — T17908A Unspecified foreign body in respiratory tract, part unspecified causing other injury, initial encounter: Secondary | ICD-10-CM | POA: Diagnosis present

## 2023-08-24 DIAGNOSIS — Z8614 Personal history of Methicillin resistant Staphylococcus aureus infection: Secondary | ICD-10-CM

## 2023-08-24 DIAGNOSIS — E785 Hyperlipidemia, unspecified: Secondary | ICD-10-CM | POA: Diagnosis present

## 2023-08-24 DIAGNOSIS — Z7901 Long term (current) use of anticoagulants: Secondary | ICD-10-CM

## 2023-08-24 DIAGNOSIS — E8770 Fluid overload, unspecified: Secondary | ICD-10-CM | POA: Diagnosis not present

## 2023-08-24 DIAGNOSIS — T17828A Food in other parts of respiratory tract causing other injury, initial encounter: Secondary | ICD-10-CM | POA: Diagnosis present

## 2023-08-24 DIAGNOSIS — Z833 Family history of diabetes mellitus: Secondary | ICD-10-CM | POA: Diagnosis not present

## 2023-08-24 DIAGNOSIS — Z794 Long term (current) use of insulin: Secondary | ICD-10-CM

## 2023-08-24 DIAGNOSIS — Z86711 Personal history of pulmonary embolism: Secondary | ICD-10-CM

## 2023-08-24 DIAGNOSIS — Z8701 Personal history of pneumonia (recurrent): Secondary | ICD-10-CM

## 2023-08-24 DIAGNOSIS — M4802 Spinal stenosis, cervical region: Secondary | ICD-10-CM | POA: Diagnosis present

## 2023-08-24 DIAGNOSIS — Z79899 Other long term (current) drug therapy: Secondary | ICD-10-CM

## 2023-08-24 DIAGNOSIS — T380X5A Adverse effect of glucocorticoids and synthetic analogues, initial encounter: Secondary | ICD-10-CM | POA: Diagnosis present

## 2023-08-24 DIAGNOSIS — G3184 Mild cognitive impairment, so stated: Secondary | ICD-10-CM | POA: Diagnosis present

## 2023-08-24 DIAGNOSIS — J690 Pneumonitis due to inhalation of food and vomit: Principal | ICD-10-CM | POA: Diagnosis present

## 2023-08-24 DIAGNOSIS — R6510 Systemic inflammatory response syndrome (SIRS) of non-infectious origin without acute organ dysfunction: Secondary | ICD-10-CM | POA: Insufficient documentation

## 2023-08-24 DIAGNOSIS — M488X2 Other specified spondylopathies, cervical region: Secondary | ICD-10-CM | POA: Diagnosis present

## 2023-08-24 DIAGNOSIS — J80 Acute respiratory distress syndrome: Secondary | ICD-10-CM | POA: Diagnosis present

## 2023-08-24 DIAGNOSIS — K589 Irritable bowel syndrome without diarrhea: Secondary | ICD-10-CM | POA: Diagnosis present

## 2023-08-24 DIAGNOSIS — J90 Pleural effusion, not elsewhere classified: Secondary | ICD-10-CM | POA: Diagnosis present

## 2023-08-24 DIAGNOSIS — E1165 Type 2 diabetes mellitus with hyperglycemia: Secondary | ICD-10-CM | POA: Diagnosis present

## 2023-08-24 DIAGNOSIS — Z7985 Long-term (current) use of injectable non-insulin antidiabetic drugs: Secondary | ICD-10-CM

## 2023-08-24 DIAGNOSIS — F329 Major depressive disorder, single episode, unspecified: Secondary | ICD-10-CM | POA: Diagnosis present

## 2023-08-24 DIAGNOSIS — K2100 Gastro-esophageal reflux disease with esophagitis, without bleeding: Secondary | ICD-10-CM | POA: Diagnosis present

## 2023-08-24 DIAGNOSIS — B9562 Methicillin resistant Staphylococcus aureus infection as the cause of diseases classified elsewhere: Secondary | ICD-10-CM | POA: Diagnosis present

## 2023-08-24 DIAGNOSIS — J189 Pneumonia, unspecified organism: Secondary | ICD-10-CM | POA: Diagnosis present

## 2023-08-24 DIAGNOSIS — R1312 Dysphagia, oropharyngeal phase: Secondary | ICD-10-CM | POA: Insufficient documentation

## 2023-08-24 DIAGNOSIS — Z7984 Long term (current) use of oral hypoglycemic drugs: Secondary | ICD-10-CM

## 2023-08-24 DIAGNOSIS — F411 Generalized anxiety disorder: Secondary | ICD-10-CM | POA: Diagnosis present

## 2023-08-24 DIAGNOSIS — K760 Fatty (change of) liver, not elsewhere classified: Secondary | ICD-10-CM | POA: Diagnosis present

## 2023-08-24 DIAGNOSIS — I10 Essential (primary) hypertension: Secondary | ICD-10-CM | POA: Diagnosis present

## 2023-08-24 DIAGNOSIS — R008 Other abnormalities of heart beat: Secondary | ICD-10-CM | POA: Diagnosis not present

## 2023-08-24 DIAGNOSIS — J9601 Acute respiratory failure with hypoxia: Secondary | ICD-10-CM | POA: Insufficient documentation

## 2023-08-24 LAB — COMPREHENSIVE METABOLIC PANEL WITH GFR
ALT: 37 U/L (ref 0–44)
AST: 29 U/L (ref 15–41)
Albumin: 3.7 g/dL (ref 3.5–5.0)
Alkaline Phosphatase: 51 U/L (ref 38–126)
Anion gap: 13 (ref 5–15)
BUN: 11 mg/dL (ref 6–20)
CO2: 21 mmol/L — ABNORMAL LOW (ref 22–32)
Calcium: 9.8 mg/dL (ref 8.9–10.3)
Chloride: 99 mmol/L (ref 98–111)
Creatinine, Ser: 0.83 mg/dL (ref 0.61–1.24)
GFR, Estimated: >60 mL/min (ref >60)
Glucose, Bld: 352 mg/dL — ABNORMAL HIGH (ref 70–99)
Potassium: 4.2 mmol/L (ref 3.5–5.1)
Sodium: 133 mmol/L — ABNORMAL LOW (ref 135–145)
Total Bilirubin: 0.9 mg/dL (ref 0.0–1.2)
Total Protein: 6.8 g/dL (ref 6.5–8.1)

## 2023-08-24 LAB — BRAIN NATRIURETIC PEPTIDE: B Natriuretic Peptide: 10.7 pg/mL (ref 0.0–100.0)

## 2023-08-24 LAB — I-STAT VENOUS BLOOD GAS, ED
Acid-base deficit: 2 mmol/L (ref 0.0–2.0)
Bicarbonate: 21 mmol/L (ref 20.0–28.0)
Calcium, Ion: 1.18 mmol/L (ref 1.15–1.40)
HCT: 46 % (ref 39.0–52.0)
Hemoglobin: 15.6 g/dL (ref 13.0–17.0)
O2 Saturation: 92 %
Potassium: 4.1 mmol/L (ref 3.5–5.1)
Sodium: 133 mmol/L — ABNORMAL LOW (ref 135–145)
TCO2: 22 mmol/L (ref 22–32)
pCO2, Ven: 32.2 mmHg — ABNORMAL LOW (ref 44–60)
pH, Ven: 7.422 (ref 7.25–7.43)
pO2, Ven: 61 mmHg — ABNORMAL HIGH (ref 32–45)

## 2023-08-24 LAB — CBC WITH DIFFERENTIAL/PLATELET
Abs Immature Granulocytes: 0.05 10*3/uL (ref 0.00–0.07)
Basophils Absolute: 0.1 10*3/uL (ref 0.0–0.1)
Basophils Relative: 0 %
Eosinophils Absolute: 0.1 10*3/uL (ref 0.0–0.5)
Eosinophils Relative: 1 %
HCT: 47.5 % (ref 39.0–52.0)
Hemoglobin: 16.3 g/dL (ref 13.0–17.0)
Immature Granulocytes: 0 %
Lymphocytes Relative: 14 %
Lymphs Abs: 1.7 10*3/uL (ref 0.7–4.0)
MCH: 31.7 pg (ref 26.0–34.0)
MCHC: 34.3 g/dL (ref 30.0–36.0)
MCV: 92.4 fL (ref 80.0–100.0)
Monocytes Absolute: 0.6 10*3/uL (ref 0.1–1.0)
Monocytes Relative: 5 %
Neutro Abs: 9.6 10*3/uL — ABNORMAL HIGH (ref 1.7–7.7)
Neutrophils Relative %: 80 %
Platelets: 164 10*3/uL (ref 150–400)
RBC: 5.14 MIL/uL (ref 4.22–5.81)
RDW: 12.9 % (ref 11.5–15.5)
WBC: 12 10*3/uL — ABNORMAL HIGH (ref 4.0–10.5)
nRBC: 0 % (ref 0.0–0.2)

## 2023-08-24 LAB — RESP PANEL BY RT-PCR (RSV, FLU A&B, COVID)  RVPGX2
Influenza A by PCR: NEGATIVE
Influenza B by PCR: NEGATIVE
Resp Syncytial Virus by PCR: NEGATIVE
SARS Coronavirus 2 by RT PCR: NEGATIVE

## 2023-08-24 LAB — GLUCOSE, CAPILLARY: Glucose-Capillary: 312 mg/dL — ABNORMAL HIGH (ref 70–99)

## 2023-08-24 MED ORDER — MELATONIN 3 MG PO TABS
6.0000 mg | ORAL_TABLET | Freq: Every evening | ORAL | Status: DC | PRN
  Administered 2023-09-01 – 2023-09-10 (×5): 6 mg via ORAL
  Filled 2023-08-24 (×6): qty 2

## 2023-08-24 MED ORDER — ONDANSETRON HCL 4 MG/2ML IJ SOLN
4.0000 mg | Freq: Four times a day (QID) | INTRAMUSCULAR | Status: DC | PRN
  Administered 2023-09-04: 4 mg via INTRAVENOUS
  Filled 2023-08-24: qty 2

## 2023-08-24 MED ORDER — ENOXAPARIN SODIUM 100 MG/ML IJ SOSY
100.0000 mg | PREFILLED_SYRINGE | Freq: Two times a day (BID) | INTRAMUSCULAR | Status: DC
  Administered 2023-08-25 (×2): 100 mg via SUBCUTANEOUS
  Filled 2023-08-24 (×3): qty 1

## 2023-08-24 MED ORDER — LACTATED RINGERS IV BOLUS
500.0000 mL | Freq: Once | INTRAVENOUS | Status: AC
  Administered 2023-08-24: 500 mL via INTRAVENOUS

## 2023-08-24 MED ORDER — INSULIN ASPART 100 UNIT/ML IJ SOLN
0.0000 [IU] | Freq: Every day | INTRAMUSCULAR | Status: DC
  Administered 2023-08-24: 4 [IU] via SUBCUTANEOUS

## 2023-08-24 MED ORDER — IPRATROPIUM-ALBUTEROL 0.5-2.5 (3) MG/3ML IN SOLN
3.0000 mL | Freq: Once | RESPIRATORY_TRACT | Status: AC
  Administered 2023-08-24: 3 mL via RESPIRATORY_TRACT
  Filled 2023-08-24: qty 3

## 2023-08-24 MED ORDER — ALBUTEROL SULFATE (2.5 MG/3ML) 0.083% IN NEBU: 2.5000 mg | INHALATION_SOLUTION | RESPIRATORY_TRACT | Status: DC | PRN

## 2023-08-24 MED ORDER — ACETAMINOPHEN 500 MG PO TABS
1000.0000 mg | ORAL_TABLET | Freq: Four times a day (QID) | ORAL | Status: DC | PRN
  Administered 2023-08-24 – 2023-08-29 (×3): 1000 mg via ORAL
  Filled 2023-08-24 (×3): qty 2

## 2023-08-24 MED ORDER — LACTATED RINGERS IV SOLN: INTRAVENOUS | Status: AC

## 2023-08-24 MED ORDER — HYDROCOD POLI-CHLORPHE POLI ER 10-8 MG/5ML PO SUER
5.0000 mL | Freq: Two times a day (BID) | ORAL | Status: DC | PRN
  Administered 2023-08-25 – 2023-09-08 (×7): 5 mL via ORAL
  Filled 2023-08-24 (×7): qty 5

## 2023-08-24 MED ORDER — INSULIN ASPART 100 UNIT/ML IJ SOLN
0.0000 [IU] | Freq: Three times a day (TID) | INTRAMUSCULAR | Status: DC
Start: 2023-08-25 — End: 2023-08-27
  Administered 2023-08-25: 15 [IU] via SUBCUTANEOUS
  Administered 2023-08-25: 11 [IU] via SUBCUTANEOUS
  Administered 2023-08-25 – 2023-08-26 (×2): 15 [IU] via SUBCUTANEOUS
  Administered 2023-08-26 (×2): 11 [IU] via SUBCUTANEOUS

## 2023-08-24 MED ORDER — POLYETHYLENE GLYCOL 3350 17 G PO PACK
17.0000 g | PACK | Freq: Every day | ORAL | Status: DC | PRN
  Administered 2023-08-29: 17 g via ORAL
  Filled 2023-08-24: qty 1

## 2023-08-24 MED ORDER — ALBUTEROL SULFATE (2.5 MG/3ML) 0.083% IN NEBU
2.5000 mg | INHALATION_SOLUTION | RESPIRATORY_TRACT | Status: DC
  Administered 2023-08-24 – 2023-08-25 (×2): 2.5 mg via RESPIRATORY_TRACT
  Filled 2023-08-24 (×3): qty 3

## 2023-08-24 MED ORDER — INSULIN GLARGINE-YFGN 100 UNIT/ML ~~LOC~~ SOLN
14.0000 [IU] | Freq: Every day | SUBCUTANEOUS | Status: DC
  Administered 2023-08-25: 14 [IU] via SUBCUTANEOUS
  Filled 2023-08-24 (×2): qty 0.14

## 2023-08-24 NOTE — ED Triage Notes (Signed)
 Patient BIB [facility] from work after choking on a roast beef sandwich. Patient able to swallow at this time and reports that he was able to cough something up. EMS reports diminished R lower lobe sounds and patient is 85% on room air, 4L up to 96%. Hx of DMII

## 2023-08-24 NOTE — Progress Notes (Signed)
 PHARMACY - ANTICOAGULATION CONSULT NOTE  Pharmacy Consult for enoxaparin  Indication: pulmonary embolus, last dose of Xarelto  5/8 AM   No Known Allergies  Patient Measurements:    Vital Signs: Temp: 97.1 F (36.2 C) (05/08 1749) Temp Source: Temporal (05/08 1749) BP: 122/69 (05/08 1733) Pulse Rate: 118 (05/08 1733)  Labs: Recent Labs    08/24/23 1745 08/24/23 1751  HGB 16.3 15.6  HCT 47.5 46.0  PLT 164  --   CREATININE 0.83  --     CrCl cannot be calculated (Unknown ideal weight.).   Medical History: Past Medical History:  Diagnosis Date   Balanitis 12/24/2015   Cellulitis and abscess 12/24/2015   Generalized anxiety disorder 09/06/2022   Hyperlipidemia 09/06/2022   Hypertension    IBS (irritable bowel syndrome)    Major depressive disorder 09/06/2022   Mild cognitive disorder 09/06/2022   Dec 07, 2016 Entered By: Rosalita Combe Comment: Medical Disabilty 12.2017 -Jul 10, 2018 Entered By: Aloma Jaksch Comment: diagnosed at [facility]   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   Non-alcoholic fatty liver disease 04/19/2011   Dec 07, 2016 Entered By: Rosalita Combe Comment: Urged to modify lifestyle &amp; Urged again 2018 to begin Coffee 2-4/d for hepatoprotection   Pneumonia 2014-2015 X 1   Pulmonary embolism (HCC) 03/21/2013   Sepsis (HCC) 11/11/2015   Type II diabetes mellitus (HCC)     Assessment: Patient admitted following a choking episode, now with dysphagia. On Xarelto  PTA for hx of PE. HgB 15.6 and PLTs 164. Pharmacy consulted to dose enoxaparin .   Goal of Therapy:  Monitor platelets by anticoagulation protocol: Yes   Plan:  Lovenox  100mg  q12h to start on 5/9 AM.  Monitor for dysphagia resolution and ability to transition back to Xarelto .   Mamie Searles, PharmD, BCCCP  08/24/2023,7:58 PM

## 2023-08-24 NOTE — ED Provider Notes (Addendum)
 [facility] Provider Note  History  Chief Complaint:  Choking  The history is provided by the patient and the EMS personnel.     Bradley [facility] is a 63 y.o. male with a history of hypertension, hyperlipidemia who presents to the emergency department after choking episode.  He reports that he was eating a roast beef sandwich when he started having choking.  He reports that he called EMS.  He never lost consciousness.  States he does not have any history of choking disorders.  States that he does feel like it is in his high neck or chest.  He is able to tolerate swallowing without any difficulty.  EMS stated that they were called out for choking.  On their arrival patient was saturating in the mid 80s on room air.  Patient was placed on nasal cannula and transported to our facility.  Past Medical History:  Diagnosis Date   Balanitis 12/24/2015   Cellulitis and abscess 12/24/2015   Generalized anxiety disorder 09/06/2022   Hyperlipidemia 09/06/2022   Hypertension    IBS (irritable bowel syndrome)    Major depressive disorder 09/06/2022   Mild cognitive disorder 09/06/2022   Dec 07, 2016 Entered By: Rosalita Combe Comment: Medical Disabilty 12.2017 -Jul 10, 2018 Entered By: Aloma Jaksch Comment: diagnosed at [place] [facility]   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   Non-alcoholic fatty liver disease 04/19/2011   Dec 07, 2016 Entered By: Rosalita Combe Comment: Urged to modify lifestyle &amp; Urged again 2018 to begin Coffee 2-4/d for hepatoprotection   Pneumonia 2014-2015 X 1   Pulmonary embolism (HCC) 03/21/2013   Sepsis (HCC) 11/11/2015   Type II diabetes mellitus (HCC)     Past Surgical History:  Procedure Laterality Date   TEE WITHOUT CARDIOVERSION N/A 11/17/2015   Procedure: TRANSESOPHAGEAL ECHOCARDIOGRAM (TEE);  Surgeon: Maudine Sos, MD;  Location: [facility] ENDOSCOPY;  Service: Cardiovascular;  Laterality: N/A;   TONSILLECTOMY  ~ 1977     Family History  Problem Relation Age of Onset   Myasthenia gravis Mother    Diabetes type II Brother     Social History   Tobacco Use   Smoking status: Never   Smokeless tobacco: Never  Substance Use Topics   Alcohol use: No   Drug use: No    Review of Systems  Review of Systems   Reviewed and documented in HPI if pertinent.   Physical Exam   ED Triage Vitals  Encounter Vitals Group     BP      Systolic BP Percentile      Diastolic BP Percentile      Pulse      Resp      Temp      Temp src      SpO2      Weight      Height      Head Circumference      Peak Flow      Pain [facility]      Pain Loc      Pain Education      Exclude from Growth Chart      Physical Exam Vitals and nursing note reviewed.  Constitutional:      General: He is not in acute distress.    Appearance: He is well-developed.  HENT:     Head: Normocephalic and atraumatic.  Eyes:     Conjunctiva/sclera: Conjunctivae normal.  Cardiovascular:     Rate and Rhythm: Regular rhythm. Tachycardia present.  Heart sounds: No murmur heard. Pulmonary:     Effort: Pulmonary effort is normal. Tachypnea present. No respiratory distress.     Breath sounds: Examination of the right-lower field reveals decreased breath sounds. Decreased breath sounds and wheezing present.  Abdominal:     Palpations: Abdomen is soft.     Tenderness: There is no abdominal tenderness.  Musculoskeletal:        General: No swelling.     Cervical back: Neck supple.     Right lower leg: No edema.     Left lower leg: No edema.  Skin:    General: Skin is warm and dry.     Capillary Refill: Capillary refill takes less than 2 seconds.  Neurological:     Mental Status: He is alert.  Psychiatric:        Mood and Affect: Mood normal.      Procedures   Procedures  ED Course - Medical Decision Making  Brief Overview [name] is a 63 y.o. male who presents as per above.  I have reviewed the nursing  documentation for past medical history, family history, and social history and agree.  I have reviewed the patient's vital signs. Tachycardia.  Initial Differential Diagnoses: I am primarily concerned for aspiration pneumonitis, pneumonia,  Therapies: These medications and interventions were provided for the patient while in the ED.  Medications  ipratropium-albuterol  (DUONEB) 0.5-2.5 (3) MG/3ML nebulizer solution 3 mL (3 mLs Nebulization Given 08/24/23 1852)    Testing Results: On my interpretation labs are significant for : Mild leukocytosis Elevated glucose  On my interpretation imaging is significant for: CXR with concerning for multifocal opacities  See the EMR for full details regarding lab and imaging results.   Medical Decision Making 63 year old presents emergency department for choking episode.  On initial evaluation patient is requiring up to 5 L nasal cannula to maintain his oxygen saturations above 90%.  Patient states he does not have a COPD or asthma history however does have some wheezing.  He does have decreased breath sounds in the right lower base.  He has no abdominal tenderness.  No nausea or vomiting.  He is saturating well on supplemental oxygen.  On chest x-ray patient does have widespread inflammation that appears to be consistent with aspiration pneumonitis.  Do not see a focal opacity.  Do not feel that antibiotics are indicated currently.  On review of laboratory studies patient does have significant leukocytosis.  Which I suspect is in the setting of stress.  Patient is currently on Xarelto .  I feel that the presentation is less likely to be PE given that it was after he aspirated that he developed a new oxygen requirement and tachycardia.  Currently I do feel the patient requires admission to [facility] for ongoing observation in the setting of a new oxygen requirement.  We did not initiate antibiotics as discussed above.  We feel this is aspiration  pneumonitis.   I discussed with the hospitalist and they will admit the patient to their service.  Problems Addressed: Aspiration pneumonitis [facility]): acute illness or injury that poses a threat to life or bodily functions  Amount and/or Complexity of Data Reviewed Labs: ordered. Radiology: ordered.  Risk Prescription drug management. Decision regarding hospitalization.     ### All radiography studies, electrocardiograms, and laboratory data were personally reviewed by me and incorporated into my medical decision making. Impression   1. Aspiration pneumonitis [facility])      Note: Dragon medical dictation software was used  in the creation of this note.     Arminda Landmark, MD 08/24/23 Lennie Ra    Arminda Landmark, MD 08/25/23 [id number]    Almond Army, MD 08/25/23 [phone]

## 2023-08-24 NOTE — H&P (Signed)
 History and Physical    Bradley Mcclure ZOX:[id number] DOB: 10/14/1960 DOA: 08/24/2023  PCP: [name]   Patient coming from: Home   Chief Complaint:  Chief Complaint  Patient presents with   Choking    HPI:  Bradley Mcclure is a 63 y.o. male veteran, with hx of esophageal dysphagia (? Prior cricopharyngeal narrowing), cognitive dysphagia, PE on anticoagulation, diabetes type 2, hypertension, hyperlipidemia, NAFLD, depression, cognitive impairment, GERD, who presented after an episode of aspiration.  Reports that around 4 PM he was eating a roast beef sandwich and felt that it was moving slowly through his oropharynx, points at his upper neck.  He started "choking" and coughing.  Drank a soda which felt this helped clear the food and he started breathing better.  He denies any issues with dysphagia prior to today.  Otherwise reports wheezing for the past 3 days although no history of asthma or COPD, smoking.  Denies any recent fever, chills, cough, rhinorrhea, sore throat, sick contacts.  No chest pain or shortness of breath.  Denies any prior treatment for dysphagia.  Appears he has seen SLP 5/'24 with diagnosis of cognitive dysphagia and rec for mechanical soft diet with thin liquids; and [facility] esophageal dysphagia with?  Cricopharyngeal narrowing and 2017. No prior endoscopy to his knowledge.    Review of Systems:  ROS complete and negative except as marked above   No Known Allergies  Prior to Admission medications   Medication Sig Start Date End Date Taking? Authorizing Provider  acetaminophen  (TYLENOL ) 325 MG tablet Take 650 mg by mouth every 6 (six) hours as needed for mild pain.     [provider]  ARIPiprazole  (ABILIFY ) 2 MG tablet Take 2 mg by mouth daily. 05/11/22   [provider]  FLUoxetine (PROZAC) 20 MG capsule Take 20 mg by mouth 2 (two) times daily as needed (anxiety/depression). 02/02/22   [provider]   losartan  (COZAAR ) 50 MG tablet Take 50 mg by mouth daily.    [provider]  metFORMIN  (GLUCOPHAGE ) 500 MG tablet Take 1,000 mg by mouth 2 (two) times daily with a meal.     [provider]  metoprolol  tartrate (LOPRESSOR ) 25 MG tablet Take 1 tablet (25 mg total) by mouth 2 (two) times daily. 09/15/22   Macdonald Savoy, MD  mupirocin  ointment (BACTROBAN ) 2 % Place 1 application into the nose 2 (two) times daily. Patient not taking: Reported on 09/06/2022 12/24/15   Ernie Heal, Jerelyn Money, MD  polyethylene glycol (MIRALAX  / GLYCOLAX ) packet Take 17 g by mouth daily. Patient not taking: Reported on 09/06/2022 03/02/16   Leonette Ramal, MD  rivaroxaban  (XARELTO ) 20 MG TABS tablet Take 20 mg by mouth daily with supper.    [provider]  Semaglutide, 2 MG/DOSE, 8 MG/3ML SOPN Inject 2 mg into the skin every 7 (seven) days. 08/29/22   [provider]    Past Medical History:  Diagnosis Date   Balanitis 12/24/2015   Cellulitis and abscess 12/24/2015   Generalized anxiety disorder 09/06/2022   Hyperlipidemia 09/06/2022   Hypertension    IBS (irritable bowel syndrome)    Major depressive disorder 09/06/2022   Mild cognitive disorder 09/06/2022   Dec 07, 2016 Entered By: Rosalita Combe Comment: Medical Disabilty 12.2017 -Jul 10, 2018 Entered By: Aloma Jaksch Comment: diagnosed at [facility]   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   Non-alcoholic fatty liver disease 04/19/2011   Dec 07, 2016 Entered By: KASSMAN,NEIL  M Comment: Urged to modify lifestyle &amp; Urged again 2018 to begin Coffee 2-4/d for hepatoprotection   Pneumonia 2014-2015 X 1   Pulmonary embolism (HCC) 03/21/2013   Sepsis (HCC) 11/11/2015   Type II diabetes mellitus (HCC)     Past Surgical History:  Procedure Laterality Date   TEE WITHOUT CARDIOVERSION N/A 11/17/2015   Procedure: TRANSESOPHAGEAL ECHOCARDIOGRAM (TEE);  Surgeon: Maudine Sos, MD;  Location: [facility] ENDOSCOPY;  Service:  Cardiovascular;  Laterality: N/A;   TONSILLECTOMY  ~ 1977     reports that he has never smoked. He has never used smokeless tobacco. He reports that he does not drink alcohol and does not use drugs.  Family History  Problem Relation Age of Onset   Myasthenia gravis Mother    Diabetes type II Brother      Physical Exam: Vitals:   08/24/23 1731 08/24/23 1733 08/24/23 1737 08/24/23 1749  BP:  122/69    Pulse:  (!) 118    Resp:  (!) 21    Temp:    (!) 97.1 F (36.2 C)  TempSrc:    Temporal  SpO2: 94% 92% 94%     Gen: Awake, alert, NAD   CV: Regular, normal S1, S2, no murmurs  Resp: Normal WOB, on 4L O2. Scattered rhonchi.  Abd: Flat, normoactive, nontender MSK: Symmetric, no edema  Skin: No rashes or lesions to exposed skin  Neuro: Alert and interactive  Psych: euthymic, appropriate    Data review:   Labs reviewed, notable for:   VBG 7.42/32 Bicarb 21, anion gap 13 Blood glucose 352 BMP within normal limit WBC 12  Micro:  Results for orders placed or performed during the hospital encounter of 09/05/22  Culture, blood (Routine x 2)     Status: None   Collection Time: 09/05/22 11:15 PM   Specimen: BLOOD  Result Value Ref Range Status   Specimen Description   Final    BLOOD SITE NOT SPECIFIED Performed at [facility], [address] [place], [place] [address]    Special Requests   Final    BOTTLES DRAWN AEROBIC AND ANAEROBIC Blood Culture results may not be optimal due to an excessive volume of blood received in culture bottles Performed at [facility], [address] [place], [place] [address]    Culture   Final    NO GROWTH 5 DAYS Performed at [facility], [address], [place], [place] [address]    Report Status 09/11/2022 FINAL  Final  Resp panel by RT-PCR (RSV, Flu A&B, Covid) Anterior Nasal Swab     Status: None   Collection Time: 09/05/22 11:17 PM   Specimen: Anterior Nasal Swab  Result Value Ref  Range Status   SARS Coronavirus 2 by RT PCR NEGATIVE NEGATIVE Final    Comment: (NOTE) SARS-CoV-2 target nucleic acids are NOT DETECTED.  The SARS-CoV-2 RNA is generally detectable in upper respiratory specimens during the acute phase of infection. The lowest concentration of SARS-CoV-2 viral copies this assay can detect is 138 copies/mL. A negative result does not preclude SARS-Cov-2 infection and should not be used as the sole basis for treatment or other patient management decisions. A negative result may occur with  improper specimen collection/handling, submission of specimen other than nasopharyngeal swab, presence of viral mutation(s) within the areas targeted by this assay, and inadequate number of viral copies(<138 copies/mL). A negative result must be combined with clinical observations, patient history, and epidemiological information. The expected result is  Negative.  Fact Sheet for Patients:  BloggerCourse.com  Fact Sheet for Healthcare Providers:  SeriousBroker.it  This test is no t yet approved or cleared by the [place]  FDA and  has been authorized for detection and/or diagnosis of SARS-CoV-2 by FDA under an Emergency Use Authorization (EUA). This EUA will remain  in effect (meaning this test can be used) for the duration of the COVID-19 declaration under Section 564(b)(1) of the Act, 21 U.S.C.section 360bbb-3(b)(1), unless the authorization is terminated  or revoked sooner.       Influenza A by PCR NEGATIVE NEGATIVE Final   Influenza B by PCR NEGATIVE NEGATIVE Final    Comment: (NOTE) The Xpert Xpress SARS-CoV-2/FLU/RSV plus assay is intended as an aid in the diagnosis of influenza from Nasopharyngeal swab specimens and should not be used as a sole basis for treatment. Nasal washings and aspirates are unacceptable for Xpert Xpress SARS-CoV-2/FLU/RSV testing.  Fact Sheet for  Patients: BloggerCourse.com  Fact Sheet for Healthcare Providers: SeriousBroker.it  This test is not yet approved or cleared by the [place]  FDA and has been authorized for detection and/or diagnosis of SARS-CoV-2 by FDA under an Emergency Use Authorization (EUA). This EUA will remain in effect (meaning this test can be used) for the duration of the COVID-19 declaration under Section 564(b)(1) of the Act, 21 U.S.C. section 360bbb-3(b)(1), unless the authorization is terminated or revoked.     Resp Syncytial Virus by PCR NEGATIVE NEGATIVE Final    Comment: (NOTE) Fact Sheet for Patients: BloggerCourse.com  Fact Sheet for Healthcare Providers: SeriousBroker.it  This test is not yet approved or cleared by the [place]  FDA and has been authorized for detection and/or diagnosis of SARS-CoV-2 by FDA under an Emergency Use Authorization (EUA). This EUA will remain in effect (meaning this test can be used) for the duration of the COVID-19 declaration under Section 564(b)(1) of the Act, 21 U.S.C. section 360bbb-3(b)(1), unless the authorization is terminated or revoked.  Performed at [facility], [address], [place], [place] [address]   Culture, blood (Routine x 2)     Status: None   Collection Time: 09/06/22 12:25 AM   Specimen: BLOOD  Result Value Ref Range Status   Specimen Description   Final    BLOOD SITE NOT SPECIFIED Performed at [facility], [address], [place], [place] [address]    Special Requests   Final    BOTTLES DRAWN AEROBIC AND ANAEROBIC Blood Culture adequate volume Performed at [facility], [address], [place], [place] [address]    Culture   Final    NO GROWTH 5 DAYS Performed at [facility], [address], [place], [place] [address]    Report Status 09/11/2022 FINAL  Final   MRSA Next Gen by PCR, Nasal     Status: Abnormal   Collection Time: 09/09/22  7:54 AM   Specimen: Nasal Mucosa; Nasal Swab  Result Value Ref Range Status   MRSA by PCR Next Gen DETECTED (A) NOT DETECTED Final    Comment: (NOTE) The GeneXpert MRSA Assay (FDA approved for NASAL specimens only), is one component of a comprehensive MRSA colonization surveillance program. It is not intended to diagnose MRSA infection nor to guide or monitor treatment for MRSA infections. Test performance is not FDA approved in patients less than 80 years old. Performed at [facility], [address], [place], [place] [address]     Imaging reviewed:  DG Chest  Port 1 View Result Date: 08/24/2023 CLINICAL DATA:  Shortness of breath, choking. EXAM: PORTABLE CHEST 1 VIEW COMPARISON:  Chest x-ray 09/08/2022 FINDINGS: There are mild patchy and ill-defined nodular densities in the mid right lung and left mid and lower lung. The costophrenic angles are clear. There is no pneumothorax. The cardiomediastinal silhouette is within normal limits. No acute fractures are seen. IMPRESSION: Mild patchy and ill-defined nodular densities in the mid right lung and left mid and lower lung, suspicious for multifocal pneumonia. Recommend follow-up PA and lateral chest x-ray in 4-6 weeks to confirm resolution. Electronically Signed   By: Tyron Gallon M.D.   On: 08/24/2023 18:30   Personally reviewed chest x-ray agree with radiology interpretation above    ED Course:  Ordered for 500 cc LR   Assessment/Plan:  63 y.o. male with hx veteran, with hx of esophageal dysphagia (? Prior cricopharyngeal narrowing), cognitive dysphagia, PE on anticoagulation, diabetes type 2, hypertension, hyperlipidemia, NAFLD, depression, cognitive impairment, GERD, who presented after an episode of aspiration.    Aspiration into the airway Suspect aspiration pneumonitis Acute hypoxic respiratory failure, on 4L  Suspect  oropharyngeal dysphagia  SIRS suspect secondary to aspiration pneumonitis Acute episode of gross aspiration while eating roast beef sandwich ~ 4PM on 5/8. Minimal respiratory symptoms prior with few days of wheezing. Likely oropharyngeal dysphagia, with sensation of globus in the upper neck which has cleared.  Prior diagnosis esophageal dysphagia and cognitive dysphagia.  On initial evaluation hypoxic to 85% on room air requiring 4 L O2, tachycardic into the 120s, respirations 20 to 30s.  Afebrile. WBC 12.  Chest x-ray demonstrating patchy multifocal infiltrates.   - Currently holding on treatment of pneumonia but close monitoring and if any respiratory decompensation or signs of worsening systemic illness to start on empiric antibiotics. - Give 1 L IV fluid and started on maintenance IV fluids 75 cc an hour while n.p.o. - Check flu/COVID/RSV, sputum culture, procalcitonin - Strict n.p.o. will swab for comfort, SLP evaluation for oropharyngeal dysphagia - Albuterol  neb every 4 hours scheduled, incentive spirometer, flutter valve, chest PT, up to chair during the day  Diabetes type 2 with hyperglycemia Home regimen Lantus  18 units in the morning, metformin , ? Jardiance , ? Ozempic. - While n.p.o. modest reduction to Semglee  14 units daily in the morning - SSI for moderate, at bedtime correction - Check A1c  Chronic medical problems: Medication management: [facility], he is unable to provide any medication list or collateral information at time of my interview.  Pharmacy med rec is pending, attention to this once complete.  For now holding home oral medications considering his dysphagia and while awaiting med rec History of PE: Hold home Xarelto  takes in the a.m., last dose 5/8 AM.  Switch to therapeutic enoxaparin  pharmacy to dose to start 5/9 AM until dysphagia addressed. Hypertension: Prior Rx for losartan  Hyperlipidemia: Prior Rx for rosuvastatin NAFLD: Noted Major depression: Prior Rx  aripiprazole , fluoxetine Cognitive impairment: Noted   There is no height or weight on file to calculate BMI.    DVT prophylaxis:  Lovenox  therapeutic  Code Status:  Full Code Diet:  Diet Orders (From admission, onward)     Start     Ordered   08/24/23 1945  Diet NPO time specified Except for: Other (See Comments)  Diet effective now       Comments: Swab for comfort  Question:  Except for  Answer:  Other (See Comments)   08/24/23 1946  Family Communication:  None   Consults:  SLP   Admission status:   Inpatient, Telemetry bed  Severity of Illness: The appropriate patient status for this patient is INPATIENT. Inpatient status is judged to be reasonable and necessary in order to provide the required intensity of service to ensure the patient's safety. The patient's presenting symptoms, physical exam findings, and initial radiographic and laboratory data in the context of their chronic comorbidities is felt to place them at high risk for further clinical deterioration. Furthermore, it is not anticipated that the patient will be medically stable for discharge from the hospital within 2 midnights of admission.   * I certify that at the point of admission it is my clinical judgment that the patient will require inpatient hospital care spanning beyond 2 midnights from the point of admission due to high intensity of service, high risk for further deterioration and high frequency of surveillance required.*   Arnulfo Larch, MD [facility]  How to contact the [facility] Attending or Consulting provider 7A - 7P or covering provider during after hours 7P -7A, for this patient.  Check the care team in [facility] and look for a) attending/consulting [facility] provider listed and b) the [facility] team listed Log into [email or web address] and use [facility]'s universal password to access. If you do not have the password, please contact the hospital operator. Locate the [facility] provider you are looking for under  [facility] and page to a number that you can be directly reached. If you still have difficulty reaching the provider, please page the [facility] (Director on Call) for the Hospitalists listed on amion for assistance.  08/24/2023, 7:56 PM

## 2023-08-25 ENCOUNTER — Inpatient Hospital Stay (HOSPITAL_COMMUNITY)

## 2023-08-25 ENCOUNTER — Other Ambulatory Visit: Payer: Self-pay

## 2023-08-25 DIAGNOSIS — T17908A Unspecified foreign body in respiratory tract, part unspecified causing other injury, initial encounter: Secondary | ICD-10-CM | POA: Diagnosis not present

## 2023-08-25 LAB — CBC
HCT: 45.6 % (ref 39.0–52.0)
Hemoglobin: 15.9 g/dL (ref 13.0–17.0)
MCH: 31.6 pg (ref 26.0–34.0)
MCHC: 34.9 g/dL (ref 30.0–36.0)
MCV: 90.7 fL (ref 80.0–100.0)
Platelets: 140 10*3/uL — ABNORMAL LOW (ref 150–400)
RBC: 5.03 MIL/uL (ref 4.22–5.81)
RDW: 13 % (ref 11.5–15.5)
WBC: 22.8 10*3/uL — ABNORMAL HIGH (ref 4.0–10.5)
nRBC: 0 % (ref 0.0–0.2)

## 2023-08-25 LAB — BASIC METABOLIC PANEL WITH GFR
Anion gap: 15 (ref 5–15)
BUN: 17 mg/dL (ref 6–20)
CO2: 20 mmol/L — ABNORMAL LOW (ref 22–32)
Calcium: 9.3 mg/dL (ref 8.9–10.3)
Chloride: 100 mmol/L (ref 98–111)
Creatinine, Ser: 0.84 mg/dL (ref 0.61–1.24)
GFR, Estimated: >60 mL/min (ref >60)
Glucose, Bld: 350 mg/dL — ABNORMAL HIGH (ref 70–99)
Potassium: 4.5 mmol/L (ref 3.5–5.1)
Sodium: 135 mmol/L (ref 135–145)

## 2023-08-25 LAB — PROCALCITONIN: Procalcitonin: 1.31 ng/mL

## 2023-08-25 LAB — HEMOGLOBIN A1C
Hgb A1c MFr Bld: 8.9 % — ABNORMAL HIGH (ref 4.8–5.6)
Mean Plasma Glucose: 208.73 mg/dL

## 2023-08-25 LAB — MAGNESIUM: Magnesium: 1.2 mg/dL — ABNORMAL LOW (ref 1.7–2.4)

## 2023-08-25 LAB — GLUCOSE, CAPILLARY
Glucose-Capillary: 371 mg/dL — ABNORMAL HIGH (ref 70–99)
Glucose-Capillary: 317 mg/dL — ABNORMAL HIGH (ref 70–99)
Glucose-Capillary: 365 mg/dL — ABNORMAL HIGH (ref 70–99)
Glucose-Capillary: 281 mg/dL — ABNORMAL HIGH (ref 70–99)

## 2023-08-25 LAB — MRSA NEXT GEN BY PCR, NASAL: MRSA by PCR Next Gen: DETECTED — AB

## 2023-08-25 LAB — PHOSPHORUS: Phosphorus: 2.9 mg/dL (ref 2.5–4.6)

## 2023-08-25 MED ORDER — INSULIN ASPART 100 UNIT/ML IJ SOLN
0.0000 [IU] | Freq: Three times a day (TID) | INTRAMUSCULAR | Status: DC
Start: 2023-08-25 — End: 2023-08-27
  Administered 2023-08-25: 7 [IU] via SUBCUTANEOUS
  Administered 2023-08-25: 9 [IU] via SUBCUTANEOUS
  Administered 2023-08-26 (×2): 7 [IU] via SUBCUTANEOUS
  Administered 2023-08-26: 9 [IU] via SUBCUTANEOUS

## 2023-08-25 MED ORDER — MUPIROCIN 2 % EX OINT
1.0000 | TOPICAL_OINTMENT | Freq: Two times a day (BID) | CUTANEOUS | Status: AC
  Administered 2023-08-25 – 2023-08-29 (×10): 1 via NASAL
  Filled 2023-08-25 (×4): qty 22

## 2023-08-25 MED ORDER — MAGNESIUM SULFATE 4 GM/100ML IV SOLN
4.0000 g | Freq: Once | INTRAVENOUS | Status: AC
  Administered 2023-08-25: 4 g via INTRAVENOUS
  Filled 2023-08-25: qty 100

## 2023-08-25 MED ORDER — CHLORHEXIDINE GLUCONATE CLOTH 2 % EX PADS
6.0000 | MEDICATED_PAD | Freq: Every day | CUTANEOUS | Status: AC
  Administered 2023-08-25 – 2023-08-29 (×5): 6 via TOPICAL

## 2023-08-25 MED ORDER — ALBUTEROL SULFATE (2.5 MG/3ML) 0.083% IN NEBU
2.5000 mg | INHALATION_SOLUTION | Freq: Three times a day (TID) | RESPIRATORY_TRACT | Status: DC
  Administered 2023-08-25 – 2023-08-30 (×15): 2.5 mg via RESPIRATORY_TRACT
  Filled 2023-08-25 (×15): qty 3

## 2023-08-25 MED ORDER — SODIUM CHLORIDE 0.9 % IV SOLN
3.0000 g | Freq: Four times a day (QID) | INTRAVENOUS | Status: DC
  Administered 2023-08-25 – 2023-08-29 (×16): 3 g via INTRAVENOUS
  Filled 2023-08-25 (×16): qty 8

## 2023-08-25 NOTE — Progress Notes (Signed)
 Patient requests to ambulate to restroom with assistance.  RN advises to wear supplemental oxygen.  Patient refuses.  RN educates on risk associated with hypoxia and fall risk associated with ambulation. Patient refuses to wear supplemental oxygen.

## 2023-08-25 NOTE — Progress Notes (Signed)
 [facility] ROUNDING NOTE Bradley Mcclure ZOX:[id number]  DOB: [birth year]  DOA: 08/24/2023  [name]  08/25/2023,7:24 AM  LOS: 1 day    Code Status: Full   from: Home current Dispo: Likely home   63 year old male Prior pulmonary embolism on Xarelto  Diabetes mellitus with previous DKA admission, depression, HTN Prior MRSA bacteremia secondary to lower extremity cellulitis in the past Previous history of dysphagia dysphagia with cricopharyngeal narrowing maybe in 2017 Some element of cognitive dysphagia?  Previously seen 08/2022 and recommended mechanical soft thin liquids Aspirated at work on a sandwich felt like he was choking  Sodium 133 potassium 4.2 BUN/creatinine 11/0.8 WBC 12 hemoglobin 16 platelet 164 ABG 7.42 pCO2 32 PO2 61 Respiratory viral panel negative CXR mild patchy ill-defined nodular density mid to right and left lower lung suspicious for multifocal pneumonia   Plan  Dysphagia and probable aspiration pneumonia White count up to 22 and although no fever start Unasyn 3 g every 6, place on saline 75 cc/H Tells me that dysphagia is not new he has had it for period of time and certain textures of food (meats) sometimes cause issues with swallowing--looks like in the past he has been told to be on mechanical soft diet which he is not compliant with Barium swallow ordered after discussion with speech therapy-will discuss with GI-patient cannot tell me whether he has had esophageal dilatation previously  DM TY 2 poorly controlled with previous DKA-Home meds include metformin  1000 twice daily, semaglutide 2 mg weekly-not on insulin  started on Lantus  14 units, continue sliding scale coverage CBGs not controlled and will need to adjust meds with moderate SSI  Previous pulmonary embolism on Xarelto  Continue Lovenox  100 every 12 for now and revert to Xarelto  if no procedures are planned see above  Cognition issues etc. 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 he does  not have any family  DVT prophylaxis: Lovenox  therapeutic dosing  Status is: Inpatient Remains inpatient appropriate because:    Requires further workup and management  Subjective: Awake coherent seems comfortable no cough no cold No chest pain He coughs occasionally but not noted by me this morning  Objective + exam Vitals:   08/24/23 1900 08/24/23 2000 08/24/23 2046 08/25/23 0443  BP:  (!) 90/58 121/66 103/60  Pulse: (!) 116 (!) 113 (!) 108   Resp: 11 18 16 19   Temp:  98.2 F (36.8 C) 97.8 F (36.6 C) 98.7 F (37.1 C)  TempSrc:   Oral Oral  SpO2: 91% 95% 93% (!) 87%  Weight:  106.6 kg     Filed Weights   08/24/23 2000  Weight: 106.6 kg    Examination: Flat affect EOMI NCAT no focal deficit Chest is clear posterolaterally no rales no rhonchi S1-S2 no murmur no rub no gallop Abdominal soft no rebound no guarding No lower extremity edema Power is 5/5  Data Reviewed: reviewed   CBC    Component Value Date/Time   WBC 12.0 (H) 08/24/2023 1745   RBC 5.14 08/24/2023 1745   HGB 15.6 08/24/2023 1751   HCT 46.0 08/24/2023 1751   PLT 164 08/24/2023 1745   MCV 92.4 08/24/2023 1745   MCH 31.7 08/24/2023 1745   MCHC 34.3 08/24/2023 1745   RDW 12.9 08/24/2023 1745   LYMPHSABS 1.7 08/24/2023 1745   MONOABS 0.6 08/24/2023 1745   EOSABS 0.1 08/24/2023 1745   BASOSABS 0.1 08/24/2023 1745      Latest Ref Rng & Units 08/24/2023    5:51  PM 08/24/2023    5:45 PM 09/15/2022    5:15 AM  CMP  Glucose 70 - 99 mg/dL  161  096   BUN 6 - 20 mg/dL  11  14   Creatinine 0.45 - 1.24 mg/dL  4.09  8.11   Sodium 914 - 145 mmol/L 133  133  137   Potassium 3.5 - 5.1 mmol/L 4.1  4.2  3.4   Chloride 98 - 111 mmol/L  99  97   CO2 22 - 32 mmol/L  21  32   Calcium 8.9 - 10.3 mg/dL  9.8  8.9   Total Protein 6.5 - 8.1 g/dL  6.8    Total Bilirubin 0.0 - 1.2 mg/dL  0.9    Alkaline Phos 38 - 126 U/L  51    AST 15 - 41 U/L  29    ALT 0 - 44 U/L  37      Scheduled Meds:  albuterol   2.5 mg  Nebulization Q4H   enoxaparin  (LOVENOX ) injection  100 mg Subcutaneous Q12H   insulin  aspart  0-15 Units Subcutaneous TID WC   insulin  aspart  0-5 Units Subcutaneous QHS   insulin  glargine-yfgn  14 Units Subcutaneous Daily   Continuous Infusions:  lactated ringers  75 mL/hr at 08/24/23 2047    Time 36  Bradley Burney Calzadilla, MD  [facility]

## 2023-08-25 NOTE — Plan of Care (Signed)
  Problem: SLP Dysphagia Goals Goal: Misc Dysphagia Goal Flowsheets (Taken 08/25/2023 0843) Misc Dysphagia Goal: Pt will complete a MBSS to objectively assess oropharyngeal swallow function and screen esophageal swallowing phase.

## 2023-08-25 NOTE — Evaluation (Signed)
 Physical Therapy Evaluation Patient Details Name: Bradley Mcclure MRN: [id number] DOB: November 14, 1960 Today's Date: 08/25/2023  History of Present Illness  Patient is a 63 y/o male admitted 08/24/23 with aspiration when choking on roast beef sandwich.  Pt needing 4L O2 with SpO2 85% and HR 120's, RR 20's.  PMH positive for h/o esophageal dysphagia, cognitive impairment, GERD, DM2, PE on anticoagulation, NAFLD, depression, HTN and HLD.  Clinical Impression  Patient presents with decreased mobility due to generalized weakness, decreased cardiopulmonary endurance, decreased safety awareness and decreased balance.  Previously living independent, driving, working as Electrical engineer at [employer].  Patient currently with SpO2 64% on RA upon PT entry.  Evidently forgot to put his O2 back on after toileting and RN concerned due to must be upright for eating/drinking, but pt with HOB at 45 degrees with liquids on his tray.  Gradual increase to 85% on 5-6L O2 seated EOB and with ambulation on 6L up to 93%, back to 83% in bed on 5L and RN aware.  Feel he is currently not safe for home alone, though not sure of support system.  Could progress to home though feel given current medical situation needs  at least initial 24/7 supervision.  PT will continue to follow in the acute setting.         If plan is discharge home, recommend the following: A little help with bathing/dressing/bathroom;A little help with walking and/or transfers;Assist for transportation;Supervision due to cognitive status;Help with stairs or ramp for entrance   Can travel by private vehicle        Equipment Recommendations None recommended by PT  Recommendations for Other Services       Functional Status Assessment Patient has had a recent decline in their functional status and demonstrates the ability to make significant improvements in function in a reasonable and predictable amount of time.     Precautions / Restrictions  Precautions Precautions: Fall Recall of Precautions/Restrictions: Impaired      Mobility  Bed Mobility Overal bed mobility: Needs Assistance Bed Mobility: Supine to Sit, Sit to Supine     Supine to sit: Mod assist, HOB elevated, Used rails Sit to supine: Mod assist   General bed mobility comments: lifting help for trunk and assist to scoot to EOB, to supine assist and mod cues for repositioning trunk and legs    Transfers Overall transfer level: Needs assistance Equipment used: None, Rolling walker (2 wheels) Transfers: Sit to/from Stand Sit to Stand: Min assist           General transfer comment: attempted couple of times unsuccessful so provided min A for initial sit to stand, second time with CGA    Ambulation/Gait Ambulation/Gait assistance: Contact guard assist Gait Distance (Feet): 200 Feet Assistive device: None Gait Pattern/deviations: Step-to pattern, Decreased stride length, Step-through pattern, Wide base of support       General Gait Details: stiffly walking and some antalgia reports callouses on feet, no true LOB though wider BOS and decreased foot clearance  Stairs            Wheelchair Mobility     Tilt Bed    Modified Rankin (Stroke Patients Only)       Balance Overall balance assessment: Needs assistance Sitting-balance support: Feet supported Sitting balance-Leahy Scale: Good     Standing balance support: No upper extremity supported Standing balance-Leahy Scale: Good Standing balance comment: no assistive device with hallway ambulation and to bathroom, some imbalance noted, though stood to urinate at  toilet without physical assist and no LOB, and washed hands at sink                             Pertinent Vitals/Pain Pain Assessment Pain Assessment: Faces Faces Pain Scale: Hurts little more Pain Location: feet with ambulation Pain Descriptors / Indicators: Tender Pain Intervention(s): Monitored during session,  Repositioned    Home Living Family/patient expects to be discharged to:: Private residence Living Arrangements: Alone   Type of Home: Other(Comment) (condo) Home Access: Stairs to enter   [employer] of Steps: 2   Home Layout: One level Home Equipment: None      Prior Function Prior Level of Function : Independent/Modified Independent;Driving               ADLs Comments: 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[employer], though states plans for robotic replacement so will likely be out of a job per pt     Extremity/Trunk Assessment   Upper Extremity Assessment Upper Extremity Assessment: Overall WFL for tasks assessed    Lower Extremity Assessment Lower Extremity Assessment: RLE deficits/detail;LLE deficits/detail RLE Deficits / Details: states callouses on feet, [facility] shoes [facility] RLE Sensation: history of peripheral neuropathy LLE Deficits / Details: states callouses on feet, wears shoes provided by [facility] LLE Sensation: history of peripheral neuropathy       Communication   Communication Communication: No apparent difficulties    Cognition Arousal: Alert Behavior During Therapy: Flat affect   PT - Cognitive impairments: History of cognitive impairments                       PT - Cognition Comments: forgot to replace his O2 after toileting, SpO2 64% in bed Following commands: Impaired Following commands impaired: Follows multi-step commands with increased time, Follows one step commands with increased time     Cueing       General Comments General comments (skin integrity, edema, etc.): SpO2 64% upon PT entry, pt had removed 4L O2 when he used the bathroom and had not replaced.  Slowly up to 80% then bumped to 6L and pt up to EOB for improvement to 85%, gradually to 90%, walking on 6L O2 SpO2 93%, back in bed SpO2 83% RN aware    Exercises     Assessment/Plan    PT Assessment Patient needs continued PT services  PT  Problem List Decreased mobility;Decreased safety awareness;Decreased activity tolerance;Cardiopulmonary status limiting activity       PT Treatment Interventions Balance training;Gait training;Stair training;Functional mobility training;Therapeutic activities;Patient/family education    PT Goals (Current goals can be found in the Care Plan section)  Acute Rehab PT Goals Patient Stated Goal: go home PT Goal Formulation: Patient unable to participate in goal setting Time For Goal Achievement: 09/08/23 Potential to Achieve Goals: Fair    Frequency Min 2X/week     Co-evaluation               AM-PAC PT "6 Clicks" Mobility  Outcome Measure Help needed turning from your back to your side while in a flat bed without using bedrails?: A Little Help needed moving from lying on your back to sitting on the side of a flat bed without using bedrails?: A Lot Help needed moving to and from a bed to a chair (including a wheelchair)?: A Little Help needed standing up from a chair using your arms (e.g., wheelchair or bedside  chair)?: A Little Help needed to walk in hospital room?: A Little Help needed climbing 3-5 steps with a railing? : Total 6 Click Score: 15    End of Session Equipment Utilized During Treatment: Gait belt;Oxygen Activity Tolerance: Patient tolerated treatment well Patient left: in bed;with call bell/phone within reach   PT Visit Diagnosis: Other abnormalities of gait and mobility (R26.89);Muscle weakness (generalized) (M62.81);Other symptoms and signs involving the nervous system (R29.898)    Time: 5784-6962 PT Time Calculation (min) (ACUTE ONLY): 32 min   Charges:   PT Evaluation $PT Eval Moderate Complexity: 1 Mod PT Treatments $Gait Training: 8-22 mins PT General Charges $$ ACUTE PT VISIT: 1 Visit         Abigail Hoff, PT Acute Rehabilitation Services Office:[phone] 08/25/2023   Marley Simmers 08/25/2023, 3:53 PM

## 2023-08-25 NOTE — Progress Notes (Signed)
 Notified Santani, MD that PCR was positive for MRSA

## 2023-08-25 NOTE — Evaluation (Addendum)
 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   Dec 07, 2016 Entered By: Rosalita Combe Comment: Medical Disabilty 12.2017 -Jul 10, 2018 Entered By: Aloma Jaksch Comment: diagnosed at [facility]   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   Non-alcoholic fatty liver disease 04/19/2011   Dec 07, 2016 Entered By: Rosalita Combe Comment: Urged to modify lifestyle &amp; Urged again 2018 to begin Coffee 2-4/d for hepatoprotection   Pneumonia 2014-2015 X 1   Pulmonary embolism (HCC) 03/21/2013   Sepsis (HCC) 11/11/2015   Type II diabetes mellitus (HCC)    Past Surgical History:  Past Surgical History:  Procedure Laterality Date   TEE WITHOUT CARDIOVERSION N/A 11/17/2015   Procedure: TRANSESOPHAGEAL ECHOCARDIOGRAM (TEE);  Surgeon: Maudine Sos, MD;  Location: [facility] ENDOSCOPY;  Service: Cardiovascular;  Laterality: N/A;   TONSILLECTOMY  ~ 1977   HPI:  Bradley Mcclure is a 63 y.o. male veteran, with hx of esophageal dysphagia (? Prior cricopharyngeal narrowing), PE on anticoagulation, diabetes type 2, hypertension, hyperlipidemia, NAFLD, depression, cognitive impairment, GERD, who presented after an episode of aspiration.  Reports that around 4 PM he was eating a roast beef sandwich and felt that it was moving slowly through his oropharynx, points at his upper neck.  He started "choking" and coughing.  Drank a soda which felt this helped clear the food and he started  breathing better.  He denies any issues with dysphagia prior to today.  Otherwise reports wheezing for the past 3 days although no history of asthma or COPD, smoking.  Denies any recent fever, chills, cough, rhinorrhea, sore throat, sick contacts.  No chest pain or shortness of breath.  Denies any prior treatment for dysphagia. [facility] of [facility] dysphagia with?  Cricopharyngeal narrowing and 2017. No prior endoscopy to his knowledge.    Assessment / Plan / Recommendation  Clinical Impression   Pt presents with a grossly functional oropharyngeal swallow per clinical swallow assessment. Based on pt's admission for choking on a roast beef sandwich and description of prior instances of globus sensation with solids, suspect a primary esophageal dysphagia. Further esophageal workup is recommended. Consider GI consult.   Pt consumed thin liquids (4-5 oz consecutive sips), applesauce (4 oz), and cracker (both softened and dried) without any overt or subtle s/s of aspiration observed. Diet advanced to a mechanical ground consistency and thin liquids. Diet only modified due to concern for esophageal dysphagia. Recommend avoiding large pieces of meat and bread items (which are not included on D2 diet) until further esophageal workup can be completed.   Per discussion with physician, GI will plan for esophagram. SLP will follow up to review results and determine if a MBSS is also needed based on findings.   SLP Visit Diagnosis: Dysphagia, unspecified (R13.10)    Aspiration Risk  Mild aspiration risk    Diet Recommendation Dysphagia 2 (Mech altered/ground);Thin liquid    Liquid Administration via: Cup;Straw Medication Administration: Whole meds with puree (crush large meds) Supervision: Patient able to self  feed;Intermittent supervision to cue for compensatory strategies Compensations: Slow rate;Small sips/bites Postural Changes: Seated upright at 90 degrees;Remain upright for at least 30  minutes after po intake    Other  Recommendations Recommended Consults: Consider GI evaluation;Consider esophageal assessment Oral Care Recommendations: Oral care BID    Recommendations for follow up therapy are one component of a multi-disciplinary discharge planning process, led by the attending physician.  Recommendations may be updated based on patient status, additional functional criteria and insurance authorization.  Follow up Recommendations Follow physician's recommendations for discharge plan and follow up therapies      Assistance Recommended at Discharge  TBD  Functional Status Assessment Patient has had a recent decline in their functional status and demonstrates the ability to make significant improvements in function in a reasonable and predictable amount of time.  Frequency and Duration min 2x/week  1 week       Prognosis Prognosis for improved oropharyngeal function: Fair Barriers to Reach Goals: Cognitive deficits      Swallow Study   General Date of Onset: 08/24/23 HPI: Bradley Mcclure is a 63 y.o. male veteran, with hx of esophageal dysphagia (? Prior cricopharyngeal narrowing), PE on anticoagulation, diabetes type 2, hypertension, hyperlipidemia, NAFLD, depression, cognitive impairment, GERD, who presented after an episode of aspiration.  Reports that around 4 PM he was eating a roast beef sandwich and felt that it was moving slowly through his oropharynx, points at his upper neck.  He started "choking" and coughing.  Drank a soda which felt this helped clear the food and he started breathing better.  He denies any issues with dysphagia prior to today.  Otherwise reports wheezing for the past 3 days although no history of asthma or COPD, smoking.  Denies any recent fever, chills, cough, rhinorrhea, sore throat, sick contacts.  No chest pain or shortness of breath.  Denies any prior treatment for dysphagia. [facility] history of esophageal dysphagia with?   Cricopharyngeal narrowing and 2017. No prior endoscopy to his knowledge. Type of Study: Bedside Swallow Evaluation Previous Swallow Assessment: Clinical swallow assessment 09/19/22 with recommendations for a mech soft diet and thin liquids Diet Prior to this Study: NPO Temperature Spikes Noted: No Respiratory Status: Nasal cannula (4L) History of Recent Intubation: No Behavior/Cognition: Alert;Cooperative Oral Cavity Assessment: Within Functional Limits Oral Cavity - Dentition: Adequate natural dentition Self-Feeding Abilities: Able to feed self Patient Positioning: Upright in bed Baseline Vocal Quality: Normal Volitional Cough:  (fair; dry and congested) Volitional Swallow: Able to elicit    Oral/Motor/Sensory Function Overall Oral Motor/Sensory Function: Within functional limits   Ice Chips Ice chips: Not tested   Thin Liquid Thin Liquid: Within functional limits Presentation: Cup;Self Fed    Nectar Thick Nectar Thick Liquid: Not tested   Honey Thick Honey Thick Liquid: Not tested   Puree Puree: Within functional limits Presentation: Self Fed;Spoon   Solid     Solid: Within functional limits Presentation: Self Fed      Leverette Read 08/25/2023,8:52 AM

## 2023-08-25 NOTE — Plan of Care (Signed)
  Problem: Health Behavior/Discharge Planning: Goal: Ability to manage health-related needs will improve Outcome: Progressing   Problem: Clinical Measurements: Goal: Will remain free from infection Outcome: Progressing   Problem: Activity: Goal: Risk for activity intolerance will decrease Outcome: Progressing   Problem: Nutrition: Goal: Adequate nutrition will be maintained Outcome: Progressing   Problem: Coping: Goal: Level of anxiety will decrease Outcome: Progressing   Problem: Elimination: Goal: Will not experience complications related to urinary retention Outcome: Progressing   Problem: Pain Managment: Goal: General experience of comfort will improve and/or be controlled Outcome: Progressing   Problem: Safety: Goal: Ability to remain free from injury will improve Outcome: Progressing

## 2023-08-26 ENCOUNTER — Encounter (HOSPITAL_COMMUNITY): Payer: Self-pay | Admitting: Internal Medicine

## 2023-08-26 DIAGNOSIS — T17908A Unspecified foreign body in respiratory tract, part unspecified causing other injury, initial encounter: Secondary | ICD-10-CM | POA: Diagnosis not present

## 2023-08-26 LAB — BASIC METABOLIC PANEL WITH GFR
Anion gap: 13 (ref 5–15)
BUN: 16 mg/dL (ref 6–20)
CO2: 23 mmol/L (ref 22–32)
Calcium: 9.1 mg/dL (ref 8.9–10.3)
Chloride: 97 mmol/L — ABNORMAL LOW (ref 98–111)
Creatinine, Ser: 0.83 mg/dL (ref 0.61–1.24)
GFR, Estimated: >60 mL/min (ref >60)
Glucose, Bld: 327 mg/dL — ABNORMAL HIGH (ref 70–99)
Potassium: 4.2 mmol/L (ref 3.5–5.1)
Sodium: 133 mmol/L — ABNORMAL LOW (ref 135–145)

## 2023-08-26 LAB — CBC WITH DIFFERENTIAL/PLATELET
Abs Immature Granulocytes: 0.08 10*3/uL — ABNORMAL HIGH (ref 0.00–0.07)
Basophils Absolute: 0 10*3/uL (ref 0.0–0.1)
Basophils Relative: 0 %
Eosinophils Absolute: 0 10*3/uL (ref 0.0–0.5)
Eosinophils Relative: 0 %
HCT: 40.9 % (ref 39.0–52.0)
Hemoglobin: 14.3 g/dL (ref 13.0–17.0)
Immature Granulocytes: 1 %
Lymphocytes Relative: 8 %
Lymphs Abs: 1.3 10*3/uL (ref 0.7–4.0)
MCH: 32.1 pg (ref 26.0–34.0)
MCHC: 35 g/dL (ref 30.0–36.0)
MCV: 91.7 fL (ref 80.0–100.0)
Monocytes Absolute: 0.5 10*3/uL (ref 0.1–1.0)
Monocytes Relative: 3 %
Neutro Abs: 13.7 10*3/uL — ABNORMAL HIGH (ref 1.7–7.7)
Neutrophils Relative %: 88 %
Platelets: 117 10*3/uL — ABNORMAL LOW (ref 150–400)
RBC: 4.46 MIL/uL (ref 4.22–5.81)
RDW: 13.2 % (ref 11.5–15.5)
WBC: 15.7 10*3/uL — ABNORMAL HIGH (ref 4.0–10.5)
nRBC: 0 % (ref 0.0–0.2)

## 2023-08-26 LAB — GLUCOSE, CAPILLARY
Glucose-Capillary: 373 mg/dL — ABNORMAL HIGH (ref 70–99)
Glucose-Capillary: 311 mg/dL — ABNORMAL HIGH (ref 70–99)
Glucose-Capillary: 319 mg/dL — ABNORMAL HIGH (ref 70–99)
Glucose-Capillary: 258 mg/dL — ABNORMAL HIGH (ref 70–99)

## 2023-08-26 MED ORDER — FLUOXETINE HCL 20 MG PO CAPS: 20.0000 mg | ORAL_CAPSULE | Freq: Two times a day (BID) | ORAL | Status: DC | PRN

## 2023-08-26 MED ORDER — ARIPIPRAZOLE 2 MG PO TABS
2.0000 mg | ORAL_TABLET | Freq: Every day | ORAL | Status: DC
  Administered 2023-08-26 – 2023-09-14 (×20): 2 mg via ORAL
  Filled 2023-08-26 (×20): qty 1

## 2023-08-26 MED ORDER — METOPROLOL TARTRATE 25 MG PO TABS
25.0000 mg | ORAL_TABLET | Freq: Two times a day (BID) | ORAL | Status: DC
  Administered 2023-08-26 – 2023-09-14 (×39): 25 mg via ORAL
  Filled 2023-08-26 (×39): qty 1

## 2023-08-26 MED ORDER — INSULIN GLARGINE-YFGN 100 UNIT/ML ~~LOC~~ SOLN
20.0000 [IU] | Freq: Every day | SUBCUTANEOUS | Status: DC
  Administered 2023-08-26 – 2023-08-31 (×6): 20 [IU] via SUBCUTANEOUS
  Filled 2023-08-26 (×6): qty 0.2

## 2023-08-26 MED ORDER — RIVAROXABAN 20 MG PO TABS
20.0000 mg | ORAL_TABLET | Freq: Every day | ORAL | Status: DC
  Administered 2023-08-26 – 2023-09-13 (×19): 20 mg via ORAL
  Filled 2023-08-26 (×19): qty 1

## 2023-08-26 NOTE — Progress Notes (Signed)
 [facility] ROUNDING NOTE CONSTANTINOS ARBALLO ZOX:[id number]  DOB: 1960-10-20  DOA: 08/24/2023  PCP: [facility]  08/26/2023,8:00 AM  LOS: 2 days    Code Status: Full   from: Home current Dispo: Likely home   63 year old male Prior pulmonary embolism on Xarelto  Diabetes mellitus with previous DKA admission, depression, HTN Prior MRSA bacteremia secondary to lower extremity cellulitis in the past Previous history of  dysphagia with cricopharyngeal narrowing maybe in 2017 Some element of cognitive dysphagia?  Previously seen 08/2022 and recommended mechanical soft thin liquids Aspirated at work on a sandwich felt like he was choking  Sodium 133 potassium 4.2 BUN/creatinine 11/0.8 WBC 12 hemoglobin 16 platelet 164 ABG 7.42 pCO2 32 PO2 61 Respiratory viral panel negative CXR mild patchy ill-defined nodular density mid to right and left lower lung suspicious for multifocal pneumonia   Plan  Dysphagia and probable aspiration pneumonia and hypoxic respiratory failure Continue Unasyn 3 g every 6--- saline lock today-continue Tussionex 5 mL every 12 as needed albuterol  nebs as needed Tells me that dysphagia is not new --barium swallow performed shows no evidence of dysmotility normal appearance and no aspiration visualized-placed on dysphagia diet and educated that he needs to continue the same Wean oxygen as able Discussed 5/9 with Dr. Karene Oto who feels no workup from GI perspective is necessary  Sinus tach Probably rebound sinus tach from withdrawal of his usual beta-blocker metoprolol  25 twice daily Reinstated, keep on telemetry today and reassess trends  DM TY 2 poorly controlled with previous DKA-Home meds include metformin  1000 twice daily, semaglutide 2 mg weekly-not on insulin  Increase Lantus  to 20 units, CBGs 280--360--- will adjust as needed going forward with sliding scale moderate coverage may need to add mealtime in the next 24 hours  Previous pulmonary embolism on  Xarelto  Continue Xarelto  20 daily-no plans for any surgery at this time  Depression Resume Abilify  2 daily, Prozac 20 twice daily as needed anxiety depression  Cognition issues etc. 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 he does not have any family  DVT prophylaxis: Lovenox  therapeutic dosing  Status is: Inpatient Remains inpatient appropriate because:    Requires further workup and management  Subjective:  Some tachycardia noted-he is hypoxic at times but he is moving around okay he is tolerating diet fairly well no chest pain no fever no nausea   Objective + exam Vitals:   08/25/23 1940 08/25/23 2008 08/26/23 0431 08/26/23 0455  BP: 123/66  115/63 109/64  Pulse: (!) 119  (!) 130 (!) 127  Resp: 20  (!) 25 20  Temp: 98.2 F (36.8 C)  98.7 F (37.1 C)   TempSrc:   Oral   SpO2: 92% 93% (!) 85% 90%  Weight:      Height:       Filed Weights   08/24/23 2000  Weight: 106.6 kg    Examination:  Flat affect no distress S1-S2 no murmurs sinus tach Chest is clear no wheeze rales or rhonchi ROM is intact moving 4 limbs Abdominal soft no rebound no guarding No lower extremity edema  Data Reviewed: reviewed   CBC    Component Value Date/Time   WBC 22.8 (H) 08/25/2023 0551   RBC 5.03 08/25/2023 0551   HGB 15.9 08/25/2023 0551   HCT 45.6 08/25/2023 0551   PLT 140 (L) 08/25/2023 0551   MCV 90.7 08/25/2023 0551   MCH 31.6 08/25/2023 0551   MCHC 34.9 08/25/2023 0551   RDW 13.0 08/25/2023 0551   LYMPHSABS  1.7 08/24/2023 1745   MONOABS 0.6 08/24/2023 1745   EOSABS 0.1 08/24/2023 1745   BASOSABS 0.1 08/24/2023 1745      Latest Ref Rng & Units 08/25/2023    5:51 AM 08/24/2023    5:51 PM 08/24/2023    5:45 PM  CMP  Glucose 70 - 99 mg/dL 161   096   BUN 6 - 20 mg/dL 17   11   Creatinine 0.45 - 1.24 mg/dL 4.09   8.11   Sodium 914 - 145 mmol/L 135  133  133   Potassium 3.5 - 5.1 mmol/L 4.5  4.1  4.2   Chloride 98 - 111 mmol/L 100   99   CO2 22 - 32 mmol/L 20   21    Calcium 8.9 - 10.3 mg/dL 9.3   9.8   Total Protein 6.5 - 8.1 g/dL   6.8   Total Bilirubin 0.0 - 1.2 mg/dL   0.9   Alkaline Phos 38 - 126 U/L   51   AST 15 - 41 U/L   29   ALT 0 - 44 U/L   37     Scheduled Meds:  albuterol   2.5 mg Nebulization TID   ARIPiprazole   2 mg Oral Daily   Chlorhexidine  Gluconate Cloth  6 each Topical Daily   enoxaparin  (LOVENOX ) injection  100 mg Subcutaneous Q12H   insulin  aspart  0-15 Units Subcutaneous TID WC   insulin  aspart  0-9 Units Subcutaneous TID WC   insulin  glargine-yfgn  14 Units Subcutaneous Daily   metoprolol  tartrate  25 mg Oral BID   mupirocin  ointment  1 Application Nasal BID   Continuous Infusions:  ampicillin-sulbactam (UNASYN) IV 3 g (08/26/23 0459)    Time 36  Verlie Glisson, MD  [facility]

## 2023-08-27 ENCOUNTER — Inpatient Hospital Stay (HOSPITAL_COMMUNITY)

## 2023-08-27 DIAGNOSIS — T17908A Unspecified foreign body in respiratory tract, part unspecified causing other injury, initial encounter: Secondary | ICD-10-CM | POA: Diagnosis not present

## 2023-08-27 LAB — GLUCOSE, CAPILLARY
Glucose-Capillary: 143 mg/dL — ABNORMAL HIGH (ref 70–99)
Glucose-Capillary: 163 mg/dL — ABNORMAL HIGH (ref 70–99)
Glucose-Capillary: 188 mg/dL — ABNORMAL HIGH (ref 70–99)
Glucose-Capillary: 229 mg/dL — ABNORMAL HIGH (ref 70–99)
Glucose-Capillary: 303 mg/dL — ABNORMAL HIGH (ref 70–99)

## 2023-08-27 LAB — CBC WITH DIFFERENTIAL/PLATELET
Abs Immature Granulocytes: 0.09 10*3/uL — ABNORMAL HIGH (ref 0.00–0.07)
Basophils Absolute: 0.1 10*3/uL (ref 0.0–0.1)
Basophils Relative: 1 %
Eosinophils Absolute: 0.1 10*3/uL (ref 0.0–0.5)
Eosinophils Relative: 1 %
HCT: 39 % (ref 39.0–52.0)
Hemoglobin: 13.4 g/dL (ref 13.0–17.0)
Immature Granulocytes: 1 %
Lymphocytes Relative: 6 %
Lymphs Abs: 0.8 10*3/uL (ref 0.7–4.0)
MCH: 31.5 pg (ref 26.0–34.0)
MCHC: 34.4 g/dL (ref 30.0–36.0)
MCV: 91.8 fL (ref 80.0–100.0)
Monocytes Absolute: 0.3 10*3/uL (ref 0.1–1.0)
Monocytes Relative: 3 %
Neutro Abs: 11.6 10*3/uL — ABNORMAL HIGH (ref 1.7–7.7)
Neutrophils Relative %: 88 %
Platelets: 107 10*3/uL — ABNORMAL LOW (ref 150–400)
RBC: 4.25 MIL/uL (ref 4.22–5.81)
RDW: 13.2 % (ref 11.5–15.5)
WBC: 13 10*3/uL — ABNORMAL HIGH (ref 4.0–10.5)
nRBC: 0 % (ref 0.0–0.2)

## 2023-08-27 LAB — BLOOD GAS, ARTERIAL
Acid-Base Excess: 5.8 mmol/L — ABNORMAL HIGH (ref 0.0–2.0)
Bicarbonate: 29.8 mmol/L — ABNORMAL HIGH (ref 20.0–28.0)
O2 Saturation: 98.1 %
Patient temperature: 37.3
pCO2 arterial: 41 mmHg (ref 32–48)
pH, Arterial: 7.48 — ABNORMAL HIGH (ref 7.35–7.45)
pO2, Arterial: 76 mmHg — ABNORMAL LOW (ref 83–108)

## 2023-08-27 LAB — BASIC METABOLIC PANEL WITH GFR
Anion gap: 11 (ref 5–15)
BUN: 13 mg/dL (ref 6–20)
CO2: 26 mmol/L (ref 22–32)
Calcium: 8.8 mg/dL — ABNORMAL LOW (ref 8.9–10.3)
Chloride: 98 mmol/L (ref 98–111)
Creatinine, Ser: 0.57 mg/dL — ABNORMAL LOW (ref 0.61–1.24)
GFR, Estimated: >60 mL/min (ref >60)
Glucose, Bld: 155 mg/dL — ABNORMAL HIGH (ref 70–99)
Potassium: 3.5 mmol/L (ref 3.5–5.1)
Sodium: 135 mmol/L (ref 135–145)

## 2023-08-27 LAB — BRAIN NATRIURETIC PEPTIDE: B Natriuretic Peptide: 76.5 pg/mL (ref 0.0–100.0)

## 2023-08-27 LAB — LACTIC ACID, PLASMA
Lactic Acid, Venous: 1.6 mmol/L (ref 0.5–1.9)
Lactic Acid, Venous: 1.8 mmol/L (ref 0.5–1.9)

## 2023-08-27 MED ORDER — FUROSEMIDE 10 MG/ML IJ SOLN
40.0000 mg | Freq: Once | INTRAMUSCULAR | Status: AC
  Administered 2023-08-27: 40 mg via INTRAVENOUS
  Filled 2023-08-27: qty 4

## 2023-08-27 MED ORDER — INSULIN ASPART 100 UNIT/ML IJ SOLN
0.0000 [IU] | INTRAMUSCULAR | Status: DC
  Administered 2023-08-27: 1 [IU] via SUBCUTANEOUS
  Administered 2023-08-27: 2 [IU] via SUBCUTANEOUS
  Administered 2023-08-27: 3 [IU] via SUBCUTANEOUS
  Administered 2023-08-27: 2 [IU] via SUBCUTANEOUS
  Administered 2023-08-28: 5 [IU] via SUBCUTANEOUS
  Administered 2023-08-28: 1 [IU] via SUBCUTANEOUS
  Administered 2023-08-28: 3 [IU] via SUBCUTANEOUS
  Administered 2023-08-28: 1 [IU] via SUBCUTANEOUS
  Administered 2023-08-28: 5 [IU] via SUBCUTANEOUS
  Administered 2023-08-29: 7 [IU] via SUBCUTANEOUS
  Administered 2023-08-29: 2 [IU] via SUBCUTANEOUS
  Administered 2023-08-29: 7 [IU] via SUBCUTANEOUS
  Administered 2023-08-29: 3 [IU] via SUBCUTANEOUS
  Administered 2023-08-29: 5 [IU] via SUBCUTANEOUS
  Administered 2023-08-30: 2 [IU] via SUBCUTANEOUS
  Administered 2023-08-30: 3 [IU] via SUBCUTANEOUS
  Administered 2023-08-30: 2 [IU] via SUBCUTANEOUS

## 2023-08-27 MED ORDER — ALBUTEROL SULFATE (2.5 MG/3ML) 0.083% IN NEBU: 2.5000 mg | INHALATION_SOLUTION | Freq: Once | RESPIRATORY_TRACT | Status: DC | PRN

## 2023-08-27 MED ORDER — FUROSEMIDE 20 MG PO TABS
20.0000 mg | ORAL_TABLET | Freq: Two times a day (BID) | ORAL | Status: DC
  Administered 2023-08-27 – 2023-08-28 (×2): 20 mg via ORAL
  Filled 2023-08-27 (×2): qty 1

## 2023-08-27 NOTE — Plan of Care (Signed)

## 2023-08-27 NOTE — Progress Notes (Signed)
 Assisted patient to sit upright at bedside.  O2 varied between 85 and 88%. On 10 L HFNC.  Encouraged rest and breathing in through nose,.  Patient resting without distress.

## 2023-08-27 NOTE — Progress Notes (Signed)
 Pt 02 SATS were in the 70's and HR was 123. I increased O2 volume and contacted the MD. He stated to put the Pt on NRB at 10-12L until the O2 was above 93.Once the goal was reached we switched back to Cook high flow at 8L, MD instructed me to titrate down throughout the night as long as the O2 stayed WDL.   MD also placed an order for a new exiting cath due to weakness.

## 2023-08-27 NOTE — Progress Notes (Signed)
 Sent MD an update regarding O2 SATS. Pt has been dipping in the high 80's throughout the night anytime I would titrate the O2. I placed the pt back on NRB because he was back in the low 80's. And on 10L. Made MD aware and he placed an order for PRN neb treatment, made RRT aware.

## 2023-08-27 NOTE — Progress Notes (Signed)
 TRH ROUNDING NOTE ZAHAR VOWLES ZOX:[id number]  DOB: 1960-07-06  DOA: 08/24/2023  PCP: [name]  08/27/2023,8:36 AM  LOS: 3 days    Code Status: Full   from: Home current Dispo: Likely home   63 year old male Prior pulmonary embolism on Xarelto  Diabetes mellitus with previous DKA admission, depression, HTN Prior MRSA bacteremia secondary to lower extremity cellulitis in the past Previous history of  dysphagia with cricopharyngeal narrowing maybe in 2017 Some element of cognitive dysphagia?  Previously seen 08/2022 and recommended mechanical soft thin liquids Aspirated at work on a sandwich felt like he was choking  Sodium 133 potassium 4.2 BUN/creatinine 11/0.8 WBC 12 hemoglobin 16 platelet 164 ABG 7.42 pCO2 32 PO2 61 Respiratory viral panel negative CXR mild patchy ill-defined nodular density mid to right and left lower lung suspicious for multifocal pneumonia 5/11 worsening hypoxia and deterioration of condition  Plan  Dysphagia and probable aspiration pneumonia and hypoxic respiratory failure Continue Unasyn 3 g every 6-- Tussionex 5 mL every 12 as needed albuterol  nebs as needed Tells me that dysphagia is not new --barium swallow performed shows no evidence of dysmotility normal appearance and no aspiration visualized--GI Dr. Karene Oto felt no need for further workup Worsening hypoxia 5/11 needing high flow oxygen, chest x-ray shows worsening infiltrates on left side despite antibiotics and improvement of white count---stop diet today Will give 1 dose of IV Lasix  and see if condition improves--if no improvement, get ABg/BNP and lactic acid and consult CCM He confirms he wants to be a full code  Sinus tach Probably rebound sinus tach from withdrawal of his usual beta-blocker metoprolol  25 twice daily Reinstated---likely now driven by sepsis physiology  DM TY 2 poorly controlled with previous DKA-Home meds include metformin  1000 twice daily, semaglutide 2 mg  weekly-not on insulin  Increase Lantus  to 20 units, CBGs 150-300--keep at q 4 coverage  Previous pulmonary embolism on Xarelto  Continue Xarelto  20 daily-no plans for any procedure  Depression Resume Abilify  2 daily, Prozac 20 twice daily as needed anxiety depression  Cognition issues etc. 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 he does not have any family  DVT prophylaxis: Lovenox  therapeutic dosing  Status is: Inpatient Remains inpatient appropriate because:    Requires further workup and management  Subjective: Worsened overnight with increasing oxygen demand--looks a little uncomfortable.  Seems awake but slightly more ill Ate full breakfast this am   Objective + exam Vitals:   08/26/23 1937 08/26/23 1953 08/27/23 0013 08/27/23 0409  BP: 135/68  115/65 125/63  Pulse: (!) 123  100 (!) 107  Resp: 16  16 16   Temp: 100.1 F (37.8 C)  98.6 F (37 C) 99.7 F (37.6 C)  TempSrc: Oral  Oral Oral  SpO2: (!) 83% 90% 93% 94%  Weight:      Height:       Filed Weights   08/24/23 2000  Weight: 106.6 kg    Examination:  Flat affect no distress S1-S2 no murmurs sinus tach Decreased AE posteriorly Abd soft nt nd No lower extremity edema Power 5/5  Data Reviewed: reviewed   CBC    Component Value Date/Time   WBC 13.0 (H) 08/27/2023 0519   RBC 4.25 08/27/2023 0519   HGB 13.4 08/27/2023 0519   HCT 39.0 08/27/2023 0519   PLT 107 (L) 08/27/2023 0519   MCV 91.8 08/27/2023 0519   MCH 31.5 08/27/2023 0519   MCHC 34.4 08/27/2023 0519   RDW 13.2 08/27/2023 0519   LYMPHSABS 0.8 08/27/2023  0519   MONOABS 0.3 08/27/2023 0519   EOSABS 0.1 08/27/2023 0519   BASOSABS 0.1 08/27/2023 0519      Latest Ref Rng & Units 08/27/2023    5:19 AM 08/26/2023   10:43 AM 08/25/2023    5:51 AM  CMP  Glucose 70 - 99 mg/dL 086  578  469   BUN 6 - 20 mg/dL 13  16  17    Creatinine 0.61 - 1.24 mg/dL 6.29  5.28  4.13   Sodium 135 - 145 mmol/L 135  133  135   Potassium 3.5 - 5.1 mmol/L  3.5  4.2  4.5   Chloride 98 - 111 mmol/L 98  97  100   CO2 22 - 32 mmol/L 26  23  20    Calcium 8.9 - 10.3 mg/dL 8.8  9.1  9.3     Scheduled Meds:  albuterol   2.5 mg Nebulization TID   ARIPiprazole   2 mg Oral Daily   Chlorhexidine  Gluconate Cloth  6 each Topical Daily   furosemide   40 mg Intravenous Once   insulin  aspart  0-9 Units Subcutaneous Q4H   insulin  glargine-yfgn  20 Units Subcutaneous Daily   metoprolol  tartrate  25 mg Oral BID   mupirocin  ointment  1 Application Nasal BID   rivaroxaban   20 mg Oral Q supper   Continuous Infusions:  ampicillin-sulbactam (UNASYN) IV 3 g (08/27/23 0406)    Time 26  Jai-Gurmukh Jemma Rasp, MD  [facility]

## 2023-08-28 DIAGNOSIS — T17908A Unspecified foreign body in respiratory tract, part unspecified causing other injury, initial encounter: Secondary | ICD-10-CM | POA: Diagnosis not present

## 2023-08-28 LAB — BASIC METABOLIC PANEL WITH GFR
Anion gap: 11 (ref 5–15)
BUN: 12 mg/dL (ref 6–20)
CO2: 26 mmol/L (ref 22–32)
Calcium: 8.6 mg/dL — ABNORMAL LOW (ref 8.9–10.3)
Chloride: 96 mmol/L — ABNORMAL LOW (ref 98–111)
Creatinine, Ser: 0.57 mg/dL — ABNORMAL LOW (ref 0.61–1.24)
GFR, Estimated: >60 mL/min (ref >60)
Glucose, Bld: 120 mg/dL — ABNORMAL HIGH (ref 70–99)
Potassium: 3.3 mmol/L — ABNORMAL LOW (ref 3.5–5.1)
Sodium: 133 mmol/L — ABNORMAL LOW (ref 135–145)

## 2023-08-28 LAB — CBC WITH DIFFERENTIAL/PLATELET
Abs Immature Granulocytes: 0.05 10*3/uL (ref 0.00–0.07)
Basophils Absolute: 0 10*3/uL (ref 0.0–0.1)
Basophils Relative: 0 %
Eosinophils Absolute: 0.4 10*3/uL (ref 0.0–0.5)
Eosinophils Relative: 4 %
HCT: 36.9 % — ABNORMAL LOW (ref 39.0–52.0)
Hemoglobin: 12.6 g/dL — ABNORMAL LOW (ref 13.0–17.0)
Immature Granulocytes: 1 %
Lymphocytes Relative: 9 %
Lymphs Abs: 0.8 10*3/uL (ref 0.7–4.0)
MCH: 31.9 pg (ref 26.0–34.0)
MCHC: 34.1 g/dL (ref 30.0–36.0)
MCV: 93.4 fL (ref 80.0–100.0)
Monocytes Absolute: 0.5 10*3/uL (ref 0.1–1.0)
Monocytes Relative: 5 %
Neutro Abs: 7.9 10*3/uL — ABNORMAL HIGH (ref 1.7–7.7)
Neutrophils Relative %: 81 %
Platelets: 116 10*3/uL — ABNORMAL LOW (ref 150–400)
RBC: 3.95 MIL/uL — ABNORMAL LOW (ref 4.22–5.81)
RDW: 13.1 % (ref 11.5–15.5)
WBC: 9.7 10*3/uL (ref 4.0–10.5)
nRBC: 0 % (ref 0.0–0.2)

## 2023-08-28 LAB — GLUCOSE, CAPILLARY
Glucose-Capillary: 139 mg/dL — ABNORMAL HIGH (ref 70–99)
Glucose-Capillary: 110 mg/dL — ABNORMAL HIGH (ref 70–99)
Glucose-Capillary: 139 mg/dL — ABNORMAL HIGH (ref 70–99)
Glucose-Capillary: 212 mg/dL — ABNORMAL HIGH (ref 70–99)
Glucose-Capillary: 286 mg/dL — ABNORMAL HIGH (ref 70–99)
Glucose-Capillary: 257 mg/dL — ABNORMAL HIGH (ref 70–99)

## 2023-08-28 MED ORDER — FUROSEMIDE 10 MG/ML IJ SOLN
40.0000 mg | Freq: Two times a day (BID) | INTRAMUSCULAR | Status: DC
  Administered 2023-08-28 – 2023-09-04 (×15): 40 mg via INTRAVENOUS
  Filled 2023-08-28 (×15): qty 4

## 2023-08-28 NOTE — TOC Initial Note (Signed)
 Transition of Care [facility]) - Initial/Assessment Note    Patient Details  Name: Bradley Mcclure MRN: [id number] Date of Birth: September 29, 1960  [facility]) CM/SW Contact:    Arron Big, LCSWA Phone Number: 08/28/2023, 11:52 AM  Clinical Narrative:    CSW introduced self/role to patient at bedside. CSW discussed PT rec's for SNF at this time. Patient received a call from his boss at work during our interaction. Patient is unsure of what he should do at this time. CSW asked if he has any family/support that can help assist him in making a decision, patient stated no. CSW will follow up at a later time regarding his decision on disposition.    TOC will continue to follow.          Expected Discharge Plan: Skilled Nursing Facility Barriers to Discharge: Continued Medical Work up, Other (must enter comment) (Patient deciding what he should do)   Patient Goals and CMS Choice Patient states their goals for this hospitalization and ongoing recovery are:: Unable to assess          Expected Discharge Plan and Services In-house Referral: Clinical Social Work     Living arrangements for the past 2 months: Apartment                                      Prior Living Arrangements/Services Living arrangements for the past 2 months: Apartment Lives with:: Self Patient language and need for interpreter reviewed:: No Do you feel safe going back to the place where you live?: Yes      Need for Family Participation in Patient Care: No (Comment) Care giver support system in place?: No (comment)   Criminal Activity/Legal Involvement Pertinent to Current Situation/Hospitalization: No - Comment as needed  Activities of Daily Living   ADL Screening (condition at time of admission) Independently performs ADLs?: Yes (appropriate for developmental age) Is the patient deaf or have difficulty hearing?: Yes Does the patient have difficulty seeing, even when wearing  glasses/contacts?: No Does the patient have difficulty concentrating, remembering, or making decisions?: Yes  Permission Sought/Granted   Permission granted to share information with : No              Emotional Assessment Appearance:: Appears stated age Attitude/Demeanor/Rapport: Engaged Affect (typically observed): Calm Orientation: : Oriented to Self, Oriented to Place, Oriented to  Time, Oriented to Situation Alcohol / Substance Use: Not Applicable Psych Involvement: No (comment)  Admission diagnosis:  Aspiration pneumonitis (HCC) [J69.0] Aspiration into airway [T17.908A] Patient Active Problem List   Diagnosis Date Noted   Aspiration into airway 08/24/2023   SIRS (systemic inflammatory response syndrome) (HCC) 08/24/2023   Oropharyngeal dysphagia 08/24/2023   Acute hypoxic respiratory failure (HCC) 08/24/2023   Multifocal pneumonia 09/06/2022   Hyperlipidemia 09/06/2022   Generalized anxiety disorder 09/06/2022   Major depressive disorder 09/06/2022   Type 2 diabetes mellitus with diabetic neuropathy, unspecified (HCC) 09/06/2022   Mild cognitive disorder 09/06/2022   History of retinal detachment 09/06/2022   Essential hypertension 09/06/2022   Type 2 diabetes mellitus (HCC) 09/06/2022   Sepsis due to Staphylococcus aureus (HCC) 03/01/2016   Cellulitis of left lower extremity    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   Cellulitis and abscess 12/24/2015   Balanitis 12/24/2015   Uncontrolled type 2 diabetes mellitus with hyperglycemia, with long-term current use of insulin  (HCC)    Bacteremia    Morbid  obesity due to excess calories (HCC)    Cellulitis of abdominal wall 11/11/2015   Sepsis (HCC) 11/11/2015   Rash 11/11/2015   Hypertension    Diabetes mellitus without complication (HCC)    Candida-induced panniculitis    GERD (gastroesophageal reflux disease) 04/19/2015   Pulmonary embolism (HCC) 03/21/2013   Non-alcoholic fatty liver disease 04/19/2011   PCP:  [name]:   [facility] [address] [employer] [employer] - [address] [place] [address] Phone: [phone] Fax: [phone]  [facility] - [place], [place] - [address] [place] [address] Phone: [phone] Fax: [phone]     Social Drivers of Health (SDOH) Social History: SDOH Screenings   Food Insecurity: No Food Insecurity (08/25/2023)  Housing: Low Risk  (08/25/2023)  Transportation Needs: No Transportation Needs (08/25/2023)  Utilities: Not At Risk (08/25/2023)  Tobacco Use: Low Risk  (08/26/2023)   SDOH Interventions:     Readmission Risk Interventions     No data to display

## 2023-08-28 NOTE — Plan of Care (Signed)
  Problem: Clinical Measurements: Goal: Cardiovascular complication will be avoided Outcome: Progressing   Problem: Safety: Goal: Ability to remain free from injury will improve Outcome: Progressing   Problem: Skin Integrity: Goal: Risk for impaired skin integrity will decrease Outcome: Progressing   Problem: Tissue Perfusion: Goal: Adequacy of tissue perfusion will improve Outcome: Progressing

## 2023-08-28 NOTE — Plan of Care (Signed)
  Problem: Clinical Measurements: Goal: Ability to maintain clinical measurements within normal limits will improve Outcome: Progressing   Problem: Metabolic: Goal: Ability to maintain appropriate glucose levels will improve Outcome: Progressing   Problem: Nutritional: Goal: Maintenance of adequate nutrition will improve Outcome: Progressing

## 2023-08-28 NOTE — Plan of Care (Signed)
  Problem: Education: Goal: Knowledge of General Education information will improve Description: Including pain rating scale, medication(s)/side effects and non-pharmacologic comfort measures 08/28/2023 2243 by Akbar Sacra K, RN Outcome: Progressing 08/28/2023 2243 by Kela Patch, RN Outcome: Progressing   Problem: Health Behavior/Discharge Planning: Goal: Ability to manage health-related needs will improve 08/28/2023 2243 by Jaline Pincock K, RN Outcome: Progressing 08/28/2023 2243 by Kela Patch, RN Outcome: Progressing   Problem: Clinical Measurements: Goal: Ability to maintain clinical measurements within normal limits will improve 08/28/2023 2243 by Advit Trethewey K, RN Outcome: Progressing 08/28/2023 2243 by Evellyn Tuff K, RN Outcome: Progressing

## 2023-08-28 NOTE — Progress Notes (Signed)
 TRH ROUNDING NOTE DIYAN JARMIN WUJ:[id number]  DOB: 04-27-1960  DOA: 08/24/2023  PCP: [facility]  08/28/2023,7:51 AM  LOS: 4 days    Code Status: Full   from: Home current Dispo: Likely home   63 year old male Prior pulmonary embolism on Xarelto  Diabetes mellitus with previous DKA admission, depression, HTN Prior MRSA bacteremia secondary to lower extremity cellulitis in the past Previous history of  dysphagia with cricopharyngeal narrowing maybe in 2017 Some element of cognitive dysphagia?  Previously seen 08/2022 and recommended mechanical soft thin liquids Aspirated at work on a sandwich felt like he was choking  Sodium 133 potassium 4.2 BUN/creatinine 11/0.8 WBC 12 hemoglobin 16 platelet 164 ABG 7.42 pCO2 32 PO2 61 Respiratory viral panel negative CXR mild patchy ill-defined nodular density mid to right and left lower lung suspicious for multifocal pneumonia 5/11 worsening hypoxia and deterioration of condition  Plan  Dysphagia and probable aspiration pneumonia and hypoxic respiratory failure Continue Unasyn 3 g every 6--would keep on board until reliably able to eat Tussionex 5 mL every 12 as needed albuterol  nebs as needed dysphagia is not new --barium swallow 5/9 shows no evidence of dysmotility normal appearance and no aspiration visualized--GI Dr. Karene Oto felt no need for further workup Hypoxia worsened 5/11-transferred to progressive-felt to be combination of pneumonia and iatrogenic fluid overload and started on Lasix  40 IV twice daily with good effect-patient was de-escalated from 10 L high flow to 67 L and we will continue Lasix  today-repeating chest x-ray in the next 24 hours to see if there is any better effect and hopefully we can transition to p.o. Lasix  in the next 24 to 48 hours with a negative fluid balance So far is -3 L and feels a little bit better than he did yesterday Keep on clear liquid diet today and monitor trends based on x-rays  Sinus  tach Probably rebound sinus tach from withdrawal of his usual beta-blocker metoprolol  25 twice daily Reinstated---likely now driven by sepsis physiology  DM TY 2 poorly controlled with previous DKA-Home meds include metformin  1000 twice daily, semaglutide 2 mg weekly-not on insulin  Increase Lantus  to 20 units, CBGs ranging between 110-130 We will place on clear liquid diet today  Previous pulmonary embolism on Xarelto  Continue Xarelto  20 daily-no plans for any procedure  Depression Resume Abilify  2 daily, Prozac 20 twice daily as needed anxiety depression  Cognition issues etc. 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 he does not have any family  DVT prophylaxis: Lovenox  therapeutic dosing  Status is: Inpatient Remains inpatient appropriate because:    Requires further workup and management  Subjective:  Coherent awake alert no distress looks relatively comfortable no chest pain no fever He is still on high flow oxygen He does not seem to be working hard to breathe today  Objective + exam Vitals:   08/27/23 2017 08/27/23 2142 08/27/23 2341 08/28/23 0400  BP:  (!) 115/47 112/63 (!) 104/56  Pulse:  97 91 90  Resp:   16 12  Temp:   98.5 F (36.9 C) 98.4 F (36.9 C)  TempSrc:   Oral Oral  SpO2: 92%  93% 93%  Weight:    106 kg  Height:       Filed Weights   08/24/23 2000 08/28/23 0400  Weight: 106.6 kg 106 kg    Examination:  Flat affect no distress S1-S2 no murmur Chest is relatively clear posterolaterally with no wheeze rales rhonchi Abdominal soft no rebound No lower extremity edema Neuro  is intact Affect is flat  Data Reviewed: reviewed   CBC    Component Value Date/Time   WBC 9.7 08/28/2023 0301   RBC 3.95 (L) 08/28/2023 0301   HGB 12.6 (L) 08/28/2023 0301   HCT 36.9 (L) 08/28/2023 0301   PLT 116 (L) 08/28/2023 0301   MCV 93.4 08/28/2023 0301   MCH 31.9 08/28/2023 0301   MCHC 34.1 08/28/2023 0301   RDW 13.1 08/28/2023 0301   LYMPHSABS 0.8  08/28/2023 0301   MONOABS 0.5 08/28/2023 0301   EOSABS 0.4 08/28/2023 0301   BASOSABS 0.0 08/28/2023 0301      Latest Ref Rng & Units 08/28/2023    3:01 AM 08/27/2023    5:19 AM 08/26/2023   10:43 AM  CMP  Glucose 70 - 99 mg/dL 782  956  213   BUN 6 - 20 mg/dL 12  13  16    Creatinine 0.61 - 1.24 mg/dL 0.86  5.78  4.69   Sodium 135 - 145 mmol/L 133  135  133   Potassium 3.5 - 5.1 mmol/L 3.3  3.5  4.2   Chloride 98 - 111 mmol/L 96  98  97   CO2 22 - 32 mmol/L 26  26  23    Calcium 8.9 - 10.3 mg/dL 8.6  8.8  9.1     Scheduled Meds:  albuterol   2.5 mg Nebulization TID   ARIPiprazole   2 mg Oral Daily   Chlorhexidine  Gluconate Cloth  6 each Topical Daily   furosemide   20 mg Oral BID   insulin  aspart  0-9 Units Subcutaneous Q4H   insulin  glargine-yfgn  20 Units Subcutaneous Daily   metoprolol  tartrate  25 mg Oral BID   mupirocin  ointment  1 Application Nasal BID   rivaroxaban   20 mg Oral Q supper   Continuous Infusions:  ampicillin-sulbactam (UNASYN) IV 3 g (08/28/23 0407)    Time 36  Verlie Glisson, MD  [facility]

## 2023-08-28 NOTE — Progress Notes (Signed)
 Speech Language Pathology Treatment: Dysphagia  Patient Details Name: Bradley Mcclure MRN: [id number] DOB: 02/02/1961 Today's Date: 08/28/2023 Time: 1002-1007 SLP Time Calculation (min) (ACUTE ONLY): 5 min  Assessment / Plan / Recommendation Clinical Impression  Pt seen for ongoing dysphagia management.  Pt tolerated all consistencies trialed, including serial straw sips of thin liquid with no clinical s/s of aspiration.  Esophagram 5/9 largely unremarkable, but limited by positioning, tablet not given.  Pt would like to advance diet texture.  Recommend regular diet with thin liquids.  Reviewed general aspiration precautions.  Will follow up with MBSS as radiology schedule permits given choking event which led to admission.  Recommend regular texture diet with thin liquid.     HPI HPI: Bradley Mcclure is a 63 y.o. male veteran, with hx of esophageal dysphagia (? Prior cricopharyngeal narrowing), PE on anticoagulation, diabetes type 2, hypertension, hyperlipidemia, NAFLD, depression, cognitive impairment, GERD, who presented after an episode of aspiration.  Reports that around 4 PM he was eating a roast beef sandwich and felt that it was moving slowly through his oropharynx, points at his upper neck.  He started "choking" and coughing.  Drank a soda which felt this helped clear the food and he started breathing better.  He denies any issues with dysphagia prior to today.  Otherwise reports wheezing for the past 3 days although no history of asthma or COPD, smoking.  Denies any recent fever, chills, cough, rhinorrhea, sore throat, sick contacts.  No chest pain or shortness of breath.  Denies any prior treatment for dysphagia. [facility] history of esophageal dysphagia with?  Cricopharyngeal narrowing and 2017. No prior endoscopy to his knowledge.      SLP Plan  MBS      Recommendations for follow up therapy are one component of a multi-disciplinary discharge planning process, led by the  attending physician.  Recommendations may be updated based on patient status, additional functional criteria and insurance authorization.    Recommendations  Diet recommendations: Regular;Thin liquid Liquids provided via: Cup;Straw Medication Administration: Whole meds with puree Supervision: Intermittent supervision to cue for compensatory strategies Compensations: Slow rate;Small sips/bites Postural Changes and/or Swallow Maneuvers: Seated upright 90 degrees                  Oral care BID    (N/A) Dysphagia, unspecified (R13.10)     MBS     Elester Grim, MA, CCC-SLP Acute Rehabilitation Services Office: [phone] 08/28/2023, 10:12 AM

## 2023-08-28 NOTE — Progress Notes (Signed)
 Physical Therapy Treatment Patient Details Name: Bradley Mcclure MRN: [id number] DOB: [birth year] Today's Date: 08/28/2023   History of Present Illness Patient is a 63 y/o male admitted 08/24/23 with aspiration when choking on roast beef sandwich.  Pt needing 4L O2 with SpO2 85% and HR 120's, RR 20's.  PMH positive for h/o esophageal dysphagia, cognitive impairment, GERD, DM2, PE on anticoagulation, NAFLD, depression, HTN and HLD.    PT Comments  Pt required min assist transfers, and CGA amb 200' pushing IV pole. Mildly unsteady gait with wide BOS. SpO2 96% at rest on 7L. Desat to 87% on 8L during amb. Pt sitting EOB at end of session. Current POC remains appropriate.     If plan is discharge home, recommend the following: A little help with bathing/dressing/bathroom;A little help with walking and/or transfers;Assist for transportation;Supervision due to cognitive status;Help with stairs or ramp for entrance   Can travel by private vehicle        Equipment Recommendations  None recommended by PT    Recommendations for Other Services       Precautions / Restrictions Precautions Precautions: Other (comment) Recall of Precautions/Restrictions: Impaired Precaution/Restrictions Comments: watch sats     Mobility  Bed Mobility               General bed mobility comments: Pt EOB on arrival.    Transfers Overall transfer level: Needs assistance Equipment used: Ambulation equipment used Transfers: Sit to/from Stand Sit to Stand: Min assist, From elevated surface           General transfer comment: multi attempts to power up    Ambulation/Gait Ambulation/Gait assistance: Contact guard assist Gait Distance (Feet): 200 Feet Assistive device: IV Pole Gait Pattern/deviations: Wide base of support, Step-through pattern, Decreased stride length Gait velocity: decreased Gait velocity interpretation: <1.31 ft/sec, indicative of household ambulator   General Gait Details:  Shoes donned for amb due to callouses bilat feet. Mildy unsteady. Desat to 87% on 8L. SpO2 96% at rest on 7L   Stairs             Wheelchair Mobility     Tilt Bed    Modified Rankin (Stroke Patients Only)       Balance Overall balance assessment: Needs assistance Sitting-balance support: Feet supported, No upper extremity supported Sitting balance-Leahy Scale: Good     Standing balance support: Single extremity supported, No upper extremity supported, During functional activity Standing balance-Leahy Scale: Fair                              Hotel manager: No apparent difficulties  Cognition Arousal: Alert Behavior During Therapy: Flat affect   PT - Cognitive impairments: History of cognitive impairments, No family/caregiver present to determine baseline                         Following commands: Impaired Following commands impaired: Follows multi-step commands with increased time, Follows one step commands with increased time    Cueing    Exercises      General Comments        Pertinent Vitals/Pain Pain Assessment Pain Assessment: No/denies pain    Home Living                          Prior Function            PT Goals (current  goals can now be found in the care plan section) Acute Rehab PT Goals Patient Stated Goal: home Progress towards PT goals: Progressing toward goals    Frequency    Min 2X/week      PT Plan      Co-evaluation              AM-PAC PT "6 Clicks" Mobility   Outcome Measure  Help needed turning from your back to your side while in a flat bed without using bedrails?: A Little Help needed moving from lying on your back to sitting on the side of a flat bed without using bedrails?: A Lot Help needed moving to and from a bed to a chair (including a wheelchair)?: A Little Help needed standing up from a chair using your arms (e.g., wheelchair or bedside  chair)?: A Little Help needed to walk in hospital room?: A Little Help needed climbing 3-5 steps with a railing? : Total 6 Click Score: 15    End of Session Equipment Utilized During Treatment: Gait belt;Oxygen Activity Tolerance: Patient tolerated treatment well Patient left: in bed;with call bell/phone within reach Nurse Communication: Mobility status PT Visit Diagnosis: Other abnormalities of gait and mobility (R26.89);Muscle weakness (generalized) (M62.81);Other symptoms and signs involving the nervous system (R29.898)     Time: 1191-4782 PT Time Calculation (min) (ACUTE ONLY): 24 min  Charges:    $Gait Training: 23-37 mins PT General Charges $$ ACUTE PT VISIT: 1 Visit                     Dorothye Gathers., PT  Office # [phone]    Guadelupe Leech 08/28/2023, 9:53 AM

## 2023-08-29 ENCOUNTER — Inpatient Hospital Stay (HOSPITAL_COMMUNITY)

## 2023-08-29 DIAGNOSIS — T17908A Unspecified foreign body in respiratory tract, part unspecified causing other injury, initial encounter: Secondary | ICD-10-CM | POA: Diagnosis not present

## 2023-08-29 LAB — CBC WITH DIFFERENTIAL/PLATELET
Abs Immature Granulocytes: 0.04 10*3/uL (ref 0.00–0.07)
Basophils Absolute: 0 10*3/uL (ref 0.0–0.1)
Basophils Relative: 1 %
Eosinophils Absolute: 0.3 10*3/uL (ref 0.0–0.5)
Eosinophils Relative: 4 %
HCT: 38.6 % — ABNORMAL LOW (ref 39.0–52.0)
Hemoglobin: 13.4 g/dL (ref 13.0–17.0)
Immature Granulocytes: 1 %
Lymphocytes Relative: 10 %
Lymphs Abs: 0.9 10*3/uL (ref 0.7–4.0)
MCH: 32 pg (ref 26.0–34.0)
MCHC: 34.7 g/dL (ref 30.0–36.0)
MCV: 92.1 fL (ref 80.0–100.0)
Monocytes Absolute: 0.5 10*3/uL (ref 0.1–1.0)
Monocytes Relative: 5 %
Neutro Abs: 6.9 10*3/uL (ref 1.7–7.7)
Neutrophils Relative %: 79 %
Platelets: 151 10*3/uL (ref 150–400)
RBC: 4.19 MIL/uL — ABNORMAL LOW (ref 4.22–5.81)
RDW: 13.1 % (ref 11.5–15.5)
WBC: 8.6 10*3/uL (ref 4.0–10.5)
nRBC: 0 % (ref 0.0–0.2)

## 2023-08-29 LAB — BASIC METABOLIC PANEL WITH GFR
Anion gap: 11 (ref 5–15)
BUN: 15 mg/dL (ref 6–20)
CO2: 28 mmol/L (ref 22–32)
Calcium: 8.5 mg/dL — ABNORMAL LOW (ref 8.9–10.3)
Chloride: 96 mmol/L — ABNORMAL LOW (ref 98–111)
Creatinine, Ser: 0.66 mg/dL (ref 0.61–1.24)
GFR, Estimated: >60 mL/min (ref >60)
Glucose, Bld: 175 mg/dL — ABNORMAL HIGH (ref 70–99)
Potassium: 3.3 mmol/L — ABNORMAL LOW (ref 3.5–5.1)
Sodium: 135 mmol/L (ref 135–145)

## 2023-08-29 LAB — GLUCOSE, CAPILLARY
Glucose-Capillary: 182 mg/dL — ABNORMAL HIGH (ref 70–99)
Glucose-Capillary: 216 mg/dL — ABNORMAL HIGH (ref 70–99)
Glucose-Capillary: 280 mg/dL — ABNORMAL HIGH (ref 70–99)
Glucose-Capillary: 309 mg/dL — ABNORMAL HIGH (ref 70–99)
Glucose-Capillary: 320 mg/dL — ABNORMAL HIGH (ref 70–99)
Glucose-Capillary: 410 mg/dL — ABNORMAL HIGH (ref 70–99)

## 2023-08-29 MED ORDER — SENNA 8.6 MG PO TABS
1.0000 | ORAL_TABLET | Freq: Every day | ORAL | Status: DC
  Administered 2023-08-29 – 2023-09-13 (×16): 8.6 mg via ORAL
  Filled 2023-08-29 (×16): qty 1

## 2023-08-29 MED ORDER — ORAL CARE MOUTH RINSE: 15.0000 mL | OROMUCOSAL | Status: DC | PRN

## 2023-08-29 MED ORDER — INSULIN ASPART 100 UNIT/ML IJ SOLN
5.0000 [IU] | Freq: Once | INTRAMUSCULAR | Status: AC
  Administered 2023-08-29: 5 [IU] via SUBCUTANEOUS

## 2023-08-29 MED ORDER — POLYETHYLENE GLYCOL 3350 17 G PO PACK
17.0000 g | PACK | Freq: Two times a day (BID) | ORAL | Status: DC
  Administered 2023-08-29 – 2023-09-13 (×19): 17 g via ORAL
  Filled 2023-08-29 (×26): qty 1

## 2023-08-29 MED ORDER — AMOXICILLIN-POT CLAVULANATE 875-125 MG PO TABS
1.0000 | ORAL_TABLET | Freq: Two times a day (BID) | ORAL | Status: AC
Start: 2023-08-29 — End: 2023-09-01
  Administered 2023-08-29 – 2023-08-31 (×6): 1 via ORAL
  Filled 2023-08-29 (×7): qty 1

## 2023-08-29 NOTE — Progress Notes (Signed)
 Date and time results received: 08/29/23 0023 (use smartphrase ".now" to insert current time)  Test: CBG Critical Value: 410  Name of Provider Notified: Dr. Michell Ahumada via page & secure chart  Orders Received? Or Actions Taken?: still waiting for orders

## 2023-08-29 NOTE — Progress Notes (Signed)
 Mobility Specialist: Progress Note   08/29/23 1223  Mobility  Activity Ambulated with assistance in hallway  Level of Assistance Contact guard assist, steadying assist  Assistive Device None  Distance Ambulated (ft) 400 ft  Activity Response Tolerated well  Mobility Referral Yes  Mobility visit 1 Mobility  Mobility Specialist Start Time (ACUTE ONLY) 1108  Mobility Specialist Stop Time (ACUTE ONLY) 1140  Mobility Specialist Time Calculation (min) (ACUTE ONLY) 32 min    Pt was agreeable to mobility session - received in bed. Heavy modA for STS with posterior lean upon initial stand but corrected with min physical assist. CG for ambulation w/o AD, mild unsteadiness occasionally but no overt LOB. No complaints. SpO2 WFL on 8LO2. Returned to room without fault. Left in chair with all needs met, call bell in reach. Chair alarm on.   Deloria Fetch Mobility Specialist Please contact via SecureChat or Rehab office at [phone]

## 2023-08-29 NOTE — TOC Progression Note (Signed)
 Transition of Care [facility]) - Progression Note    Patient Details  Name: Bradley Mcclure MRN: [id number] Date of Birth: 1960/04/27  Transition of [facility]) CM/SW Contact  Arron Big, [place] Phone Number: 08/29/2023, 11:38 AM  Clinical Narrative:   CSW spoke with patient at bedside about what he plans to do at discharge. Patient stated he wants to go home and declined SNF/HH. CSW notified NCM.   TOC will continue to follow.    Expected Discharge Plan: Home/Self Care Barriers to Discharge: Continued Medical Work up  Expected Discharge Plan and Services In-house Referral: Clinical Social Work     Living arrangements for the past 2 months: Apartment                                       Social Determinants of Health (SDOH) Interventions SDOH Screenings   Food Insecurity: No Food Insecurity (08/25/2023)  Housing: Low Risk  (08/25/2023)  Transportation Needs: No Transportation Needs (08/25/2023)  Utilities: Not At Risk (08/25/2023)  Tobacco Use: Low Risk  (08/26/2023)    Readmission Risk Interventions     No data to display

## 2023-08-29 NOTE — Progress Notes (Signed)
 Came to room for breathing treatment, found sat 86% on 7 lpm [place].  Pt states he "feels fine", no distress noted.  Increased HFNC to 10 lpm, sat now 90-93%.  Notified MD and RN.  Per MD, okay w/ sat 90% or above.

## 2023-08-29 NOTE — Plan of Care (Signed)
   Problem: Education: Goal: Knowledge of General Education information will improve Description Including pain rating scale, medication(s)/side effects and non-pharmacologic comfort measures Outcome: Progressing

## 2023-08-29 NOTE — Inpatient Diabetes Management (Signed)
 Inpatient Diabetes Program Recommendations  AACE/ADA: New Consensus Statement on Inpatient Glycemic Control (2015)  Target Ranges:  Prepandial:   less than 140 mg/dL      Peak postprandial:   less than 180 mg/dL (1-2 hours)      Critically ill patients:  140 - 180 mg/dL   Lab Results  Component Value Date   GLUCAP 280 (H) 08/29/2023   HGBA1C 8.9 (H) 08/25/2023    Review of Glycemic Control  Latest Reference Range & Units 08/28/23 08:14 08/28/23 11:18 08/28/23 15:33 08/28/23 21:26 08/29/23 00:23 08/29/23 03:44 08/29/23 06:21 08/29/23 11:07  Glucose-Capillary 70 - 99 mg/dL 564 (H)  Novolog  1 unit 212 (H)  Novolog  3 units 286 (H)  Novolog  5 units 257 (H)  Novolog  5 units 410 (H)  Novolog  5 units 182 (H)  Novolog  2 uits 216 (H)  Novolog  3 units 280 (H)     Diabetes history: DM 2 Outpatient Diabetes medications: Metformin  1000 mg bid, Ozempic 2 mg Weekly Current orders for Inpatient glycemic control:  Semglee  20 units Daily Novolog  0-9 units Q4 hours  Inpatient Diabetes Program Recommendations:    -  as diet advances may benefit from Novolog  3 units tid meal coverage  Thanks,  Eloise Hake RN, MSN, BC-ADM Inpatient Diabetes Coordinator Team Pager [phone] (8a-5p)

## 2023-08-29 NOTE — Progress Notes (Signed)
 TRH ROUNDING NOTE TREMELL KISSELL WUX:[id number]  DOB: [birth year]  DOA: 08/24/2023  PCP: [name]  08/29/2023,8:25 AM  LOS: 5 days    Code Status: Full   from: Home current Dispo: Likely home   63 year old male Prior pulmonary embolism on Xarelto  Diabetes mellitus with previous DKA admission, depression, HTN Prior MRSA bacteremia secondary to lower extremity cellulitis in the past Previous history of  dysphagia with cricopharyngeal narrowing maybe in 2017 Some element of cognitive dysphagia?  Previously seen 08/2022 and recommended mechanical soft thin liquids Aspirated at work on a sandwich felt like he was choking  Sodium 133 potassium 4.2 BUN/creatinine 11/0.8 WBC 12 hemoglobin 16 platelet 164 ABG 7.42 pCO2 32 PO2 61 Respiratory viral panel negative CXR mild patchy ill-defined nodular density mid to right and left lower lung suspicious for multifocal pneumonia 5/11 worsening hypoxia and deterioration of condition  Plan  Dysphagia and probable aspiration pneumonia and hypoxic respiratory failure Unasyn-->Augmentin  end of treatment 08/31/2023--- watch for low-grade temperatures Tussionex 5 mL every 12 as needed albuterol  nebs as needed dysphagia is not new --barium swallow 5/9 shows no evidence of dysmotility normal appearance and no aspiration visualized--GI Dr. Karene Oto felt no need for further workup  Iatrogenic volume overload Hypoxia worsened 5/11-transferred to progressive-felt to be combination of pneumonia and iatrogenic fluid Continues on Lasix  40 IV twice daily--would continue this dose for 1 to 2 days and then transition to 40 twice daily p.o. Repeat CXR 5/13 significantly improved compared to 5/11 infiltrates are better  Sinus tach Probably rebound sinus tach from withdrawal of his usual beta-blocker metoprolol  25 twice daily Reinstated---likely now driven by sepsis physiology  DM TY 2 poorly controlled with previous DKA-Home meds include metformin  1000  twice daily, semaglutide 2 mg weekly-not on insulin  Increase Lantus  to 20 units overnight CBGs uncontrolled 400 range Continues Semglee  20, change to 4 times daily ACHS and graduated diet to diabetic  Previous pulmonary embolism on Xarelto  Continue Xarelto  20 daily-no plans for any procedure  Depression Resume Abilify  2 daily, Prozac 20 twice daily as needed anxiety depression  Cognition issues etc. 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 he does not have any family  DVT prophylaxis: Lovenox  therapeutic dosing  Status is: Inpatient Remains inpatient appropriate because:    Requires further workup and management  Subjective:  Overall looks well no distress Took off his oxygen and desatted to 83 He has no chest pain He is eating a full meal Sugar overnight was 400   Objective + exam Vitals:   08/29/23 0020 08/29/23 0345 08/29/23 0757 08/29/23 0759  BP:  120/64 115/71   Pulse: 94 95 86   Resp: (!) 32 19 20   Temp:  (!) 101 F (38.3 C) 97.6 F (36.4 C)   TempSrc:  Axillary Oral   SpO2: 91% 93% 91% 91%  Weight:  104 kg    Height:       Filed Weights   08/24/23 2000 08/28/23 0400 08/29/23 0345  Weight: 106.6 kg 106 kg 104 kg    Examination:  Flat affect no distress No increased work of breathing Chest has crackles posterolaterally S1-S2 no murmur seems to be sinus, sinus tach Abdominal soft no rebound No lower extremity edema no JVD Power 5/5  Data Reviewed: reviewed   CBC    Component Value Date/Time   WBC 8.6 08/29/2023 0143   RBC 4.19 (L) 08/29/2023 0143   HGB 13.4 08/29/2023 0143   HCT 38.6 (L) 08/29/2023 0143  PLT 151 08/29/2023 0143   MCV 92.1 08/29/2023 0143   MCH 32.0 08/29/2023 0143   MCHC 34.7 08/29/2023 0143   RDW 13.1 08/29/2023 0143   LYMPHSABS 0.9 08/29/2023 0143   MONOABS 0.5 08/29/2023 0143   EOSABS 0.3 08/29/2023 0143   BASOSABS 0.0 08/29/2023 0143      Latest Ref Rng & Units 08/29/2023    3:59 AM 08/28/2023    3:01 AM  08/27/2023    5:19 AM  CMP  Glucose 70 - 99 mg/dL 528  413  244   BUN 6 - 20 mg/dL 15  12  13    Creatinine 0.61 - 1.24 mg/dL 0.10  2.72  5.36   Sodium 135 - 145 mmol/L 135  133  135   Potassium 3.5 - 5.1 mmol/L 3.3  3.3  3.5   Chloride 98 - 111 mmol/L 96  96  98   CO2 22 - 32 mmol/L 28  26  26    Calcium 8.9 - 10.3 mg/dL 8.5  8.6  8.8     Scheduled Meds:  albuterol   2.5 mg Nebulization TID   amoxicillin -clavulanate  1 tablet Oral Q12H   ARIPiprazole   2 mg Oral Daily   Chlorhexidine  Gluconate Cloth  6 each Topical Daily   furosemide   40 mg Intravenous Q12H   insulin  aspart  0-9 Units Subcutaneous Q4H   insulin  glargine-yfgn  20 Units Subcutaneous Daily   metoprolol  tartrate  25 mg Oral BID   mupirocin  ointment  1 Application Nasal BID   rivaroxaban   20 mg Oral Q supper   Continuous Infusions:    Time 26  Jai-Gurmukh Steadman Prosperi, MD  [facility]

## 2023-08-29 NOTE — TOC Progression Note (Signed)
 Transition of Care [facility]) - Progression Note    Patient Details  Name: Bradley Mcclure MRN: [id number] Date of Birth: 17-Jan-1961  Transition of Care [facility]) CM/SW Contact  Jennett Model, RN Phone Number: 08/29/2023, 3:57 PM  Clinical Narrative:    NCM spoke with patient, he states he does not want HH services.  He lives alone and able to take care of himself, he states he still drives also.  He conts to be on oxygen 7liters HFNC , TOC will continue to follow.   Expected Discharge Plan: Home/Self Care Barriers to Discharge: Continued Medical Work up  Expected Discharge Plan and Services In-house Referral: Clinical Social Work     Living arrangements for the past 2 months: Apartment                                       Social Determinants of Health (SDOH) Interventions SDOH Screenings   Food Insecurity: No Food Insecurity (08/25/2023)  Housing: Low Risk  (08/25/2023)  Transportation Needs: No Transportation Needs (08/25/2023)  Utilities: Not At Risk (08/25/2023)  Tobacco Use: Low Risk  (08/26/2023)    Readmission Risk Interventions     No data to display

## 2023-08-30 ENCOUNTER — Inpatient Hospital Stay (HOSPITAL_COMMUNITY)

## 2023-08-30 DIAGNOSIS — J690 Pneumonitis due to inhalation of food and vomit: Principal | ICD-10-CM

## 2023-08-30 LAB — CBC WITH DIFFERENTIAL/PLATELET
Abs Immature Granulocytes: 0.05 10*3/uL (ref 0.00–0.07)
Basophils Absolute: 0 10*3/uL (ref 0.0–0.1)
Basophils Relative: 0 %
Eosinophils Absolute: 0.3 10*3/uL (ref 0.0–0.5)
Eosinophils Relative: 3 %
HCT: 39.3 % (ref 39.0–52.0)
Hemoglobin: 13.5 g/dL (ref 13.0–17.0)
Immature Granulocytes: 1 %
Lymphocytes Relative: 10 %
Lymphs Abs: 0.9 10*3/uL (ref 0.7–4.0)
MCH: 31.5 pg (ref 26.0–34.0)
MCHC: 34.4 g/dL (ref 30.0–36.0)
MCV: 91.8 fL (ref 80.0–100.0)
Monocytes Absolute: 0.6 10*3/uL (ref 0.1–1.0)
Monocytes Relative: 7 %
Neutro Abs: 7.2 10*3/uL (ref 1.7–7.7)
Neutrophils Relative %: 79 %
Platelets: 155 10*3/uL (ref 150–400)
RBC: 4.28 MIL/uL (ref 4.22–5.81)
RDW: 13.1 % (ref 11.5–15.5)
WBC: 9.1 10*3/uL (ref 4.0–10.5)
nRBC: 0 % (ref 0.0–0.2)

## 2023-08-30 LAB — COMPREHENSIVE METABOLIC PANEL WITH GFR
ALT: 101 U/L — ABNORMAL HIGH (ref 0–44)
AST: 46 U/L — ABNORMAL HIGH (ref 15–41)
Albumin: 2.5 g/dL — ABNORMAL LOW (ref 3.5–5.0)
Alkaline Phosphatase: 78 U/L (ref 38–126)
Anion gap: 12 (ref 5–15)
BUN: 14 mg/dL (ref 6–20)
CO2: 30 mmol/L (ref 22–32)
Calcium: 8.7 mg/dL — ABNORMAL LOW (ref 8.9–10.3)
Chloride: 93 mmol/L — ABNORMAL LOW (ref 98–111)
Creatinine, Ser: 0.61 mg/dL (ref 0.61–1.24)
GFR, Estimated: >60 mL/min (ref >60)
Glucose, Bld: 187 mg/dL — ABNORMAL HIGH (ref 70–99)
Potassium: 3.2 mmol/L — ABNORMAL LOW (ref 3.5–5.1)
Sodium: 135 mmol/L (ref 135–145)
Total Bilirubin: 1.2 mg/dL (ref 0.0–1.2)
Total Protein: 6.4 g/dL — ABNORMAL LOW (ref 6.5–8.1)

## 2023-08-30 LAB — GLUCOSE, CAPILLARY
Glucose-Capillary: 192 mg/dL — ABNORMAL HIGH (ref 70–99)
Glucose-Capillary: 193 mg/dL — ABNORMAL HIGH (ref 70–99)
Glucose-Capillary: 242 mg/dL — ABNORMAL HIGH (ref 70–99)
Glucose-Capillary: 319 mg/dL — ABNORMAL HIGH (ref 70–99)
Glucose-Capillary: 338 mg/dL — ABNORMAL HIGH (ref 70–99)
Glucose-Capillary: 348 mg/dL — ABNORMAL HIGH (ref 70–99)

## 2023-08-30 LAB — BRAIN NATRIURETIC PEPTIDE: B Natriuretic Peptide: 21.1 pg/mL (ref 0.0–100.0)

## 2023-08-30 MED ORDER — VANCOMYCIN HCL 750 MG/150ML IV SOLN
750.0000 mg | Freq: Two times a day (BID) | INTRAVENOUS | Status: DC
  Administered 2023-08-31 – 2023-09-02 (×6): 750 mg via INTRAVENOUS
  Filled 2023-08-30 (×7): qty 150

## 2023-08-30 MED ORDER — INSULIN ASPART 100 UNIT/ML IJ SOLN
0.0000 [IU] | Freq: Three times a day (TID) | INTRAMUSCULAR | Status: DC
  Administered 2023-08-30: 7 [IU] via SUBCUTANEOUS
  Administered 2023-08-31 (×2): 5 [IU] via SUBCUTANEOUS
  Administered 2023-08-31: 7 [IU] via SUBCUTANEOUS
  Administered 2023-09-01 (×3): 5 [IU] via SUBCUTANEOUS
  Administered 2023-09-02: 9 [IU] via SUBCUTANEOUS
  Administered 2023-09-02: 2 [IU] via SUBCUTANEOUS

## 2023-08-30 MED ORDER — POTASSIUM CHLORIDE CRYS ER 20 MEQ PO TBCR
40.0000 meq | EXTENDED_RELEASE_TABLET | ORAL | Status: AC
  Administered 2023-08-30 (×2): 40 meq via ORAL
  Filled 2023-08-30 (×2): qty 2

## 2023-08-30 MED ORDER — VANCOMYCIN HCL 2000 MG/400ML IV SOLN
2000.0000 mg | Freq: Once | INTRAVENOUS | Status: AC
  Administered 2023-08-30: 2000 mg via INTRAVENOUS
  Filled 2023-08-30: qty 400

## 2023-08-30 MED ORDER — INSULIN ASPART 100 UNIT/ML IJ SOLN
0.0000 [IU] | Freq: Every day | INTRAMUSCULAR | Status: DC
  Administered 2023-08-30: 4 [IU] via SUBCUTANEOUS
  Administered 2023-08-31: 2 [IU] via SUBCUTANEOUS
  Administered 2023-09-01: 3 [IU] via SUBCUTANEOUS

## 2023-08-30 MED ORDER — ALBUTEROL SULFATE (2.5 MG/3ML) 0.083% IN NEBU
2.5000 mg | INHALATION_SOLUTION | Freq: Two times a day (BID) | RESPIRATORY_TRACT | Status: DC
  Administered 2023-08-30 – 2023-09-03 (×7): 2.5 mg via RESPIRATORY_TRACT
  Filled 2023-08-30 (×8): qty 3

## 2023-08-30 NOTE — Plan of Care (Signed)
   Problem: Education: Goal: Knowledge of General Education information will improve Description Including pain rating scale, medication(s)/side effects and non-pharmacologic comfort measures Outcome: Progressing   Problem: Health Behavior/Discharge Planning: Goal: Ability to manage health-related needs will improve Outcome: Progressing

## 2023-08-30 NOTE — Progress Notes (Signed)
 Pharmacy Antibiotic Note  Bradley Mcclure is a 63 y.o. male admitted on 08/24/2023 with pneumonia.  Pharmacy has been consulted for vancomycin  dosing.  Plan: Vancomycin  2g IV x1 as load followed by vancomycin  750 mg q12h -goal 15-61mcg/ml F/u levels PRN  F/u cultures, LOT, renal func  Height: 6\' 1"  (185.4 cm) Weight: 104 kg (229 lb 4.5 oz) IBW/kg (Calculated) : 79.9  Temp (24hrs), Avg:99.1 F (37.3 C), Min:98.2 F (36.8 C), Max:99.7 F (37.6 C)  Recent Labs  Lab 08/26/23 1043 08/27/23 0519 08/27/23 1105 08/27/23 1358 08/28/23 0301 08/29/23 0143 08/29/23 0359 08/30/23 0254 08/30/23 0737  WBC 15.7* 13.0*  --   --  9.7 8.6  --  9.1  --   CREATININE 0.83 0.57*  --   --  0.57*  --  0.66  --  0.61  LATICACIDVEN  --   --  1.6 1.8  --   --   --   --   --     Estimated Creatinine Clearance: 124.3 mL/min (by C-G formula based on SCr of 0.61 mg/dL).    Allergies  Allergen Reactions   Empagliflozin  Other (See Comments)    Acidosis    Antimicrobials this admission: Vanc 5/14>  Dose adjustments this admission:   Microbiology results: 5/9 MRSA pos 5/8 RVP neg  Thank you for allowing pharmacy to be a part of this patient's care.  Oralee Billow, PharmD, BCCCP Clinical Pharmacist 08/30/2023 6:36 PM

## 2023-08-30 NOTE — Procedures (Signed)
 Modified Barium Swallow Study  Patient Details  Name: Bradley Mcclure MRN: [id number] Date of Birth: October 06, 1960  Today's Date: 08/30/2023  Modified Barium Swallow completed.  Full report located under Chart Review in the Imaging Section.  History of Present Illness Bradley Mcclure is a 63 y.o. male veteran, with hx of esophageal dysphagia (? Prior cricopharyngeal narrowing), PE on anticoagulation, diabetes type 2, hypertension, hyperlipidemia, NAFLD, depression, cognitive impairment, GERD, who presented after an episode of aspiration.  Reports that around 4 PM he was eating a roast beef sandwich and felt that it was moving slowly through his oropharynx, points at his upper neck.  He started "choking" and coughing.  Drank a soda which felt this helped clear the food and he started breathing better.  He denies any issues with dysphagia prior to today.  Otherwise reports wheezing for the past 3 days although no history of asthma or COPD, smoking.  Denies any recent fever, chills, cough, rhinorrhea, sore throat, sick contacts.  No chest pain or shortness of breath.  Denies any prior treatment for dysphagia. [facility] history of esophageal dysphagia with?  Cricopharyngeal narrowing and 2017. No prior endoscopy to his knowledge.   Clinical Impression Pt presents with a mild-moderate oropharyngeal dysphagia c/b reduced base of tongue retraction, incomplete laryngeal closure, absent epiglottic inversion with liquid and diminished sensation.  These deficits resulted in deep, silent penetration of thin and nectar thick liquids.  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  Pt was able to acheive full epiglottic inversion with heavier bolus trials (puree, solid).  Pt's cued cough is fairly weak.  Cued cough and secondary swallow were partially beneficial for clearance of  penetration.  Pt's cough is weak sounding and lacking crispness.  Chin tuck prevented penetration of thin liquid.  It is difficult to determine acute v chronic nature of pt's current presentation.  Presumed osteophytes are not acute, but it appears pt has been able to compensate for their presence prior to this admission.  And this may explain cricopharyngeal narrowing [facility] as in addition to pharyngeal projections at C2-3 there are further presumed osteophytes at the level of the UES.  Bolus was able to pass through and on esophageal sweep there was no retention of contrast.  Would consider consult to address structural issues affecting swallow as pt is relatively young and this will continue to place him at higher risk for aspiration. During pill simulation pill was given whole with puree as compensatory strategies can be difficult to coordinate with tablet and liquid wash.  Initially pt chewed tablet, but on second attempt was able to swallow tablet whole with puree.   Recommend continuing regular texture diet with thin liquid, with use of CHIN TUCK. Administer medications whole with puree.  DIGEST Swallow Severity Rating*  Safety: 2  Efficiency: 0  Overall Pharyngeal Swallow Severity: 2 1: mild; 2: moderate; 3: severe; 4: profound  *The Dynamic Imaging Grade of Swallowing Toxicity is standardized for the head and neck cancer population, however, demonstrates promising clinical applications across populations to standardize the clinical rating of pharyngeal swallow safety and severity.    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 inversion:   Factors that may increase risk of adverse event in presence of aspiration Roderick Civatte & Jessy Morocco 2021): Respiratory or GI disease  Swallow Evaluation Recommendations Recommendations: PO diet PO Diet  Recommendation: Regular;Thin liquids (Level 0) Liquid Administration via: Cup;Straw Medication Administration: Whole meds with  puree Supervision: Intermittent supervision/cueing for swallowing strategies Swallowing strategies  :  Chin tuck; Slow rate; Small bites/sips Postural changes: Position pt fully upright for meals Recommended consults:  Consider consult to address structural impairments to swallow (i.e. cervical osteophytes)      Elester Grim, MA, CCC-SLP Acute Rehabilitation Services Office: [phone] 08/30/2023,11:20 AM

## 2023-08-30 NOTE — Progress Notes (Signed)
 Speech Language Pathology Treatment: Dysphagia  Patient Details Name: Bradley Mcclure MRN: [id number] DOB: 1961/04/18 Today's Date: 08/30/2023 Time: 7829-5621 SLP Time Calculation (min) (ACUTE ONLY): 7 min  Assessment / Plan / Recommendation Clinical Impression  Pt seen for ongoing dysphagia management.  Nursing reached out with concerns for safety and questions about logistics of MBSS with pt now requiring O2 10L by high flow [place]. Yesterday pt with O2 sat at 80% on 7L by [place] and change to HFNC at that time.  Pt did eat breakfast this morning and today tolerated trials of puree and thin liquid without any clinical s/s of aspiration.  Given changing respiratory status, pt would likely benefit from MBS to r/o silent aspiration, though none observed on esophagram. Will proceed with MBSS planned for this morning.     HPI HPI: Bradley Mcclure is a 63 y.o. male veteran, with hx of esophageal dysphagia (? Prior cricopharyngeal narrowing), PE on anticoagulation, diabetes type 2, hypertension, hyperlipidemia, NAFLD, depression, cognitive impairment, GERD, who presented after an episode of aspiration.  Reports that around 4 PM he was eating a roast beef sandwich and felt that it was moving slowly through his oropharynx, points at his upper neck.  He started "choking" and coughing.  Drank a soda which felt this helped clear the food and he started breathing better.  He denies any issues with dysphagia prior to today.  Otherwise reports wheezing for the past 3 days although no history of asthma or COPD, smoking.  Denies any recent fever, chills, cough, rhinorrhea, sore throat, sick contacts.  No chest pain or shortness of breath.  Denies any prior treatment for dysphagia. [facility] history of esophageal dysphagia with?  Cricopharyngeal narrowing and 2017. No prior endoscopy to his knowledge.      SLP Plan  MBS      Recommendations for follow up therapy are one component of a multi-disciplinary  discharge planning process, led by the attending physician.  Recommendations may be updated based on patient status, additional functional criteria and insurance authorization.    Recommendations  Diet recommendations: Regular;Thin liquid Liquids provided via: Cup;Straw Medication Administration: Whole meds with puree Supervision: Intermittent supervision to cue for compensatory strategies Compensations: Slow rate;Small sips/bites Postural Changes and/or Swallow Maneuvers: Seated upright 90 degrees                  Oral care BID     Dysphagia, unspecified (R13.10)     MBS     Elester Grim, MA, CCC-SLP Acute Rehabilitation Services Office: [phone] 08/30/2023, 10:50 AM

## 2023-08-30 NOTE — Progress Notes (Signed)
 PROGRESS NOTE    Bradley Mcclure  ZOX:[id number] DOB: 1960/11/19 DOA: 08/24/2023 PCP: [name]  Chief Complaint  Patient presents with   Choking    Brief Narrative:   63 yo  with hx PE, T2DM, hx MRSA bacteremia, hx dysphagia and cricopharyngeal narrowing and multiple other medical issues who presented after an aspiration event while eating Bradley Mcclure sandwich at work.   Assessment & Plan:   Principal Problem:   Aspiration into airway Active Problems:   Uncontrolled type 2 diabetes mellitus with hyperglycemia, with long-term current use of insulin  (HCC)   SIRS (systemic inflammatory response syndrome) (HCC)   Oropharyngeal dysphagia   Acute hypoxic respiratory failure (HCC)  Aspiration Pneumonia  Multifocal Pneumonia Acute Hypoxic Respiratory Failure CXR 5/8 with mild patchy and ill defined nodular densities in the mid right lung and left mid and lower lung (needs repeat cxr in 4-6 weeks to ensure resolution) Last fever 5/13 CXR 5/13 with improving L lung pneumonia and persistent R upper lobe pneumonia Continued significant O2 needs, on 10 L HFNC Will get CT chest 5/15 am  Currently on abx with augmentin  for now, will add vanc with lack of improvement.  Notably positive MRSA PCR. Urine strep, urine legionella.  Sputum cx if able to collect.   Dysphagia SLP eval, appreciate assistance - presumed osteophytes projecting into the pharynx and preventing epiglottic inversion with liquids - will get CT continue current diet with use of chin tuck to prevent penetration.   Volume Overload Hypoxia worsened on 5/11, thought due to pneumonia and iatrogenic fluid overload Currently on lasix  40 mg IV BID Follow echo  T2DM At home on metformin , semaglutide Currently on lantus  20 units with SSI  Sinus Tach Follow on his chronic metop   Hx PE  Xarelto    Depression Abilify , prozac  Cognitive Impairment Unclear baseline    DVT prophylaxis: xarelto  Code Status:  full Family Communication: none Disposition:   Status is: Inpatient Remains inpatient appropriate because: need for ongoing inpatient care with hypoxia   Consultants:  none  Procedures:  none  Antimicrobials:  Anti-infectives (From admission, onward)    Start     Dose/Rate Route Frequency Ordered Stop   08/29/23 1000  amoxicillin -clavulanate (AUGMENTIN ) 875-125 MG per tablet 1 tablet        1 tablet Oral Every 12 hours 08/29/23 0836 09/01/23 0959   08/25/23 1000  Ampicillin-Sulbactam (UNASYN) 3 g in sodium chloride  0.9 % 100 mL IVPB  Status:  Discontinued        3 g 200 mL/hr over 30 Minutes Intravenous Every 6 hours 08/25/23 0859 08/29/23 0836       Subjective: No new complaints  Objective: Vitals:   08/30/23 0720 08/30/23 0823 08/30/23 1047 08/30/23 1549  BP: 132/67  130/64 122/83  Pulse: 88 96 97 95  Resp: 20  20 20   Temp: 99.2 F (37.3 C)  99.2 F (37.3 C) 99 F (37.2 C)  TempSrc: Oral  Oral Oral  SpO2: 91%     Weight:      Height:        Intake/Output Summary (Last 24 hours) at 08/30/2023 1822 Last data filed at 08/30/2023 1551 Gross per 24 hour  Intake 360 ml  Output 400 ml  Net -40 ml   Filed Weights   08/24/23 2000 08/28/23 0400 08/29/23 0345  Weight: 106.6 kg 106 kg 104 kg    Examination:  General exam: Appears calm and comfortable  Respiratory system:diminished Cardiovascular system: RRR Gastrointestinal  system: Abdomen is nondistended, soft and nontender.  Central nervous system: Alert and oriented. No focal neurological deficits. Extremities: no LEE   Data Reviewed: I have personally reviewed following labs and imaging studies  CBC: Recent Labs  Lab 08/26/23 1043 08/27/23 0519 08/28/23 0301 08/29/23 0143 08/30/23 0254  WBC 15.7* 13.0* 9.7 8.6 9.1  NEUTROABS 13.7* 11.6* 7.9* 6.9 7.2  HGB 14.3 13.4 12.6* 13.4 13.5  HCT 40.9 39.0 36.9* 38.6* 39.3  MCV 91.7 91.8 93.4 92.1 91.8  PLT 117* 107* 116* 151 155    Basic Metabolic  Panel: Recent Labs  Lab 08/25/23 0551 08/26/23 1043 08/27/23 0519 08/28/23 0301 08/29/23 0359 08/30/23 0737  NA 135 133* 135 133* 135 135  K 4.5 4.2 3.5 3.3* 3.3* 3.2*  CL 100 97* 98 96* 96* 93*  CO2 20* 23 26 26 28 30   GLUCOSE 350* 327* 155* 120* 175* 187*  BUN 17 16 13 12 15 14   CREATININE 0.84 0.83 0.57* 0.57* 0.66 0.61  CALCIUM 9.3 9.1 8.8* 8.6* 8.5* 8.7*  MG 1.2*  --   --   --   --   --   PHOS 2.9  --   --   --   --   --     GFR: Estimated Creatinine Clearance: 124.3 mL/min (by C-G formula based on SCr of 0.61 mg/dL).  Liver Function Tests: Recent Labs  Lab 08/24/23 1745 08/30/23 0737  AST 29 46*  ALT 37 101*  ALKPHOS 51 78  BILITOT 0.9 1.2  PROT 6.8 6.4*  ALBUMIN 3.7 2.5*    CBG: Recent Labs  Lab 08/30/23 0026 08/30/23 0444 08/30/23 0750 08/30/23 1053 08/30/23 1616  GLUCAP 242* 192* 193* 348* 338*     Recent Results (from the past 240 hours)  Resp panel by RT-PCR (RSV, Flu Shresta Risden&B, Covid) Anterior Nasal Swab     Status: None   Collection Time: 08/24/23  8:10 PM   Specimen: Anterior Nasal Swab  Result Value Ref Range Status   SARS Coronavirus 2 by RT PCR NEGATIVE NEGATIVE Final   Influenza [place] by PCR NEGATIVE NEGATIVE Final   Influenza B by PCR NEGATIVE NEGATIVE Final    Comment: (NOTE) The Xpert Xpress SARS-CoV-2/FLU/RSV plus assay is intended as an aid in the diagnosis of influenza from Nasopharyngeal swab specimens and should not be used as Marcie Shearon sole basis for treatment. Nasal washings and aspirates are unacceptable for Xpert Xpress SARS-CoV-2/FLU/RSV testing.  Fact Sheet for Patients: BloggerCourse.com  Fact Sheet for Healthcare Providers: SeriousBroker.it  This test is not yet approved or cleared by the [place]  FDA and has been authorized for detection and/or diagnosis of SARS-CoV-2 by FDA under an Emergency Use Authorization (EUA). This EUA will remain in effect (meaning this test can be  used) for the duration of the COVID-19 declaration under Section 564(b)(1) of the Act, 21 U.S.C. section 360bbb-3(b)(1), unless the authorization is terminated or revoked.     Resp Syncytial Virus by PCR NEGATIVE NEGATIVE Final    Comment: (NOTE) Fact Sheet for Patients: BloggerCourse.com  Fact Sheet for Healthcare Providers: SeriousBroker.it  This test is not yet approved or cleared by the [place]  FDA and has been authorized for detection and/or diagnosis of SARS-CoV-2 by FDA under an Emergency Use Authorization (EUA). This EUA will remain in effect (meaning this test can be used) for the duration of the COVID-19 declaration under Section 564(b)(1) of the Act, 21 U.S.C. section 360bbb-3(b)(1), unless the authorization is terminated or revoked.  Performed  at [facility], [address] [place], [place] [address]   MRSA Next Gen by PCR, Nasal     Status: Abnormal   Collection Time: 08/25/23  2:40 PM   Specimen: Nasal Mucosa; Nasal Swab  Result Value Ref Range Status   MRSA by PCR Next Gen DETECTED (Syrianna Schillaci) NOT DETECTED Final    Comment: RESULT CALLED TO, READ BACK BY AND VERIFIED WITH: RN Amedeo Bailiff on [phone] @1715  by SM (NOTE) The GeneXpert MRSA Assay (FDA approved for NASAL specimens only), is one component of Lando Alcalde comprehensive MRSA colonization surveillance program. It is not intended to diagnose MRSA infection nor to guide or monitor treatment for MRSA infections. Test performance is not FDA approved in patients less than 33 years old. Performed at [facility], [address], [place], [place] [address]          Radiology Studies: DG Swallowing Func-Speech Pathology Result Date: 08/30/2023 Table formatting from the original result was not included. Images from the original result were not included. Modified Barium Swallow Study Patient Details Name: HAIK TJARKS MRN: [id number] Date of Birth:  Mar 15, 1961 Today's Date: 08/30/2023 HPI/PMH: HPI: THERMAN TREBING is Myrick Mcnairy 63 y.o. male veteran, with hx of esophageal dysphagia (? Prior cricopharyngeal narrowing), PE on anticoagulation, diabetes type 2, hypertension, hyperlipidemia, NAFLD, depression, cognitive impairment, GERD, who presented after an episode of aspiration.  Reports that around 4 PM he was eating Latria Mccarron roast beef sandwich and felt that it was moving slowly through his oropharynx, points at his upper neck.  He started "choking" and coughing.  Drank Keysean Savino soda which felt this helped clear the food and he started breathing better.  He denies any issues with dysphagia prior to today.  Otherwise reports wheezing for the past 3 days although no history of asthma or COPD, smoking.  Denies any recent fever, chills, cough, rhinorrhea, sore throat, sick contacts.  No chest pain or shortness of breath.  Denies any prior treatment for dysphagia. [facility] dysphagia with?  Cricopharyngeal narrowing and 2017. No prior endoscopy to his knowledge. Clinical Impression: Pt presents with Nikita Surman mild-moderate oropharyngeal dysphagia c/b reduced base of tongue retraction, incomplete laryngeal closure, absent epiglottic inversion with liquid and diminished sensation.  These deficits resulted in deep, silent penetration of thin and nectar thick liquids.  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  Pt was able to acheive full epiglottic inversion with heavier bolus trials (puree, solid).  Pt's cued cough is fairly weak.  Cued cough and secondary swallow were partially beneficial for clearance of penetration.  Pt's cough is weak sounding and lacking crispness.  Chin tuck prevented penetration of thin liquid.  It is difficult to determine acute v chronic nature of pt's current presentation.  Presumed osteophytes are not acute,  but it appears pt has been able to compensate for their presence prior to this admission.  And this may explain cricopharyngeal narrowing [facility] as in addition to pharyngeal projections at C2-3 there are further presumed osteophytes at the level of the UES.  Bolus was able to pass through and on esophageal sweep there was no retention of contrast.  Would consider consult to address structural issues affecting swallow as pt is relatively young and this will continue to place him at higher risk for aspiration. During pill simulation  pill was given whole with puree as compensatory strategies can be difficult to coordinate with tablet and liquid wash.  Initially pt chewed tablet, but on second attempt was able to swallow tablet whole with puree. Recommend continuing regular texture diet with thin liquid, with use of CHIN TUCK. Administer medications whole with puree. DIGEST Swallow Severity Rating*  Safety: 2  Efficiency: 0  Overall Pharyngeal Swallow Severity: 2 1: mild; 2: moderate; 3: severe; 4: profound *The Dynamic Imaging Grade of Swallowing Toxicity is standardized for the head and neck cancer population, however, demonstrates promising clinical applications across populations to standardize the clinical rating of pharyngeal swallow safety and severity. 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 inversion: Factors that may increase risk of adverse event in presence of aspiration Roderick Civatte & Jessy Morocco 2021): Respiratory or GI disease Swallow Evaluation Recommendations Recommendations: PO diet PO Diet Recommendation: Regular;Thin liquids (Level 0) Liquid Administration via: Cup;Straw Medication Administration: Whole meds with puree Supervision: Intermittent supervision/cueing for swallowing strategies Swallowing strategies  : Chin tuck; Slow rate; Small bites/sips Postural changes: Position pt fully upright for meals Recommended consults:  Consider consult to address structural impairments  to swallow (i.e. cervical osteophytes) Treatment Plan Treatment Plan Treatment recommendations: Therapy as outlined in treatment plan below Follow-up recommendations: -- (ST at next level of care) Functional status assessment: Patient has had Marshon Bangs recent decline in their functional status and demonstrates the ability to make significant improvements in function in Orla Jolliff reasonable and predictable amount of time. Treatment frequency: Min 2x/week Treatment duration: 2 weeks Interventions: Respiratory muscle strength training; Compensatory techniques; Diet toleration management by SLP; Aspiration precaution training Recommendations Recommendations for follow up therapy are one component of Ariabella Brien multi-disciplinary discharge planning process, led by the attending physician.  Recommendations may be updated based on patient status, additional functional criteria and insurance authorization. Assessment: Orofacial Exam: Orofacial Exam Oral Cavity: Oral Hygiene: WFL Oral Cavity - Dentition: Adequate natural dentition Orofacial Anatomy: WFL Oral Motor/Sensory Function: -- (See BSE) Anatomy: Anatomy: Suspected cervical osteophytes Boluses Administered: Boluses Administered Boluses Administered: Thin liquids (Level 0); Mildly thick liquids (Level 2, nectar thick); Moderately thick liquids (Level 3, honey thick); Puree; Solid  Oral Impairment Domain: Oral Impairment Domain Lip Closure: No labial escape Tongue control during bolus hold: Cohesive bolus between tongue to palatal seal Bolus preparation/mastication: Timely and efficient chewing and mashing Bolus transport/lingual motion: Brisk tongue motion Oral residue: Complete oral clearance Location of oral residue : N/Nakina Spatz Initiation of pharyngeal swallow : Posterior angle of the ramus  Pharyngeal Impairment Domain: Pharyngeal Impairment Domain Soft palate elevation: No bolus between soft palate (SP)/pharyngeal wall (PW) Laryngeal elevation: Complete superior movement of thyroid cartilage  with complete approximation of arytenoids to epiglottic petiole Anterior hyoid excursion: Complete anterior movement Epiglottic movement: No inversion (With liquids.  Complete inversion with solids) Pharyngeal stripping wave : Present - diminished Pharyngeal contraction (Zelpha Messing/P view only): N/Samarth Ogle Pharyngoesophageal segment opening: Partial distention/partial duration, partial obstruction of flow Tongue base retraction: Trace column of contrast or air between tongue base and PPW Pharyngeal residue: Collection of residue within or on pharyngeal structures Location of pharyngeal residue: Valleculae  Esophageal Impairment Domain: Esophageal Impairment Domain Esophageal clearance upright position: Complete clearance, esophageal coating Pill: Pill Consistency administered: Puree Puree: WFL Penetration/Aspiration Scale Score: Penetration/Aspiration Scale Score 1.  Material does not enter airway: Moderately thick liquids (Level 3, honey thick); Puree; Solid; Pill 3.  Material enters airway, remains ABOVE vocal cords and not ejected out: Thin liquids (Level 0); Mildly thick liquids (Level 2, nectar thick)  Compensatory Strategies: Compensatory Strategies Compensatory strategies: Yes Chin tuck: Effective Effective Chin Tuck: Thin liquid (Level 0) Other(comment): -- (Cued cough and re-swallow, partially effective)   General Information: Caregiver present: No  Diet Prior to this Study: Regular; Thin liquids (Level 0)   No data recorded  Respiratory Status: WFL   Supplemental O2: None (Room air) (Despite requiring 10L by HFNC at bedside)   No data recorded Behavior/Cognition: Alert; Cooperative Self-Feeding Abilities: Able to self-feed Baseline vocal quality/speech: Normal Volitional Cough: Able to elicit No data recorded Exam Limitations: No limitations Goal Planning: Prognosis for improved oropharyngeal function: Fair Barriers to Reach Goals: -- (Structural changes affecting swallow function) No data recorded No data recorded  Consulted and agree with results and recommendations: Patient; Nurse Pain: Pain Assessment Faces Pain Scale: 0 Breathing: 0 End of Session: Start Time:SLP Start Time (ACUTE ONLY): 0919 Stop Time: SLP Stop Time (ACUTE ONLY): 0926 Time Calculation:SLP Time Calculation (min) (ACUTE ONLY): 7 min Charges: SLP Evaluations $ SLP Speech Visit: 1 Visit SLP Evaluations $Swallowing Treatment: 1 Procedure SLP visit diagnosis: SLP Visit Diagnosis: Dysphagia, oropharyngeal phase (R13.12) Past Medical History: Past Medical History: Diagnosis Date  Balanitis 12/24/2015  Cellulitis and abscess 12/24/2015  Generalized anxiety disorder 09/06/2022  Hyperlipidemia 09/06/2022  Hypertension   IBS (irritable bowel syndrome)   Major depressive disorder 09/06/2022  Mild cognitive disorder 09/06/2022  Dec 07, 2016 Entered By: Rosalita Combe Comment: Medical Disabilty 12.2017 -Jul 10, 2018 Entered By: Aloma Jaksch Comment: diagnosed at [facility]  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  Non-alcoholic fatty liver disease 04/19/2011  Dec 07, 2016 Entered By: Rosalita Combe Comment: Urged to modify lifestyle &amp; Urged again 2018 to begin Coffee 2-4/d for hepatoprotection  Pneumonia 2014-2015 X 1  Pulmonary embolism (HCC) 03/21/2013  Sepsis (HCC) 11/11/2015  Type II diabetes mellitus (HCC)  Past Surgical History: Past Surgical History: Procedure Laterality Date  TEE WITHOUT CARDIOVERSION N/Subrena Devereux 11/17/2015  Procedure: TRANSESOPHAGEAL ECHOCARDIOGRAM (TEE);  Surgeon: Maudine Sos, MD;  Location[facility] ENDOSCOPY;  Service: Cardiovascular;  Laterality: N/Evely Gainey;  TONSILLECTOMY  ~ 1977 Elester Grim, MA, CCC-SLP Acute Rehabilitation Services Office: [phone] 08/30/2023, 11:24 AM  DG Chest 2 View Result Date: 08/29/2023 CLINICAL DATA:  Pneumonia EXAM: CHEST - 2 VIEW COMPARISON:  Aug 27, 2023 FINDINGS: Persistent right upper lobe consolidating parenchymal changes correlate with pneumonia. With interval improvement in the previously described infiltrates of the  left lung. Heart and mediastinum normal No pleural effusions IMPRESSION: Improving left lung pneumonia persistent right upper lobe pneumonia Electronically Signed   By: Fredrich Jefferson M.D.   On: 08/29/2023 07:33        Scheduled Meds:  albuterol   2.5 mg Nebulization BID   amoxicillin -clavulanate  1 tablet Oral Q12H   ARIPiprazole   2 mg Oral Daily   furosemide   40 mg Intravenous Q12H   insulin  aspart  0-5 Units Subcutaneous QHS   insulin  aspart  0-9 Units Subcutaneous TID WC   insulin  glargine-yfgn  20 Units Subcutaneous Daily   metoprolol  tartrate  25 mg Oral BID   polyethylene glycol  17 g Oral BID   rivaroxaban   20 mg Oral Q supper   senna  1 tablet Oral QHS   Continuous Infusions:   LOS: 6 days    Time spent: over 30 min     Donnetta Gains, MD [facility]   To contact the attending provider between 7A-7P or the covering provider during after hours 7P-7A, please log into the web site [email or web address] and access using universal [place]  password for that web site. If you do not have the password, please call the hospital operator.  08/30/2023, 6:22 PM

## 2023-08-30 NOTE — Progress Notes (Signed)
 Physical Therapy Treatment Patient Details Name: Bradley Mcclure MRN: [id number] DOB: 12/09/1960 Today's Date: 08/30/2023   History of Present Illness Patient is a 63 y/o male admitted 08/24/23 with aspiration when choking on roast beef sandwich.  Pt needing 4L O2 with SpO2 85% and HR 120's, RR 20's.  PMH positive for h/o esophageal dysphagia, cognitive impairment, GERD, DM2, PE on anticoagulation, NAFLD, depression, HTN and HLD.    PT Comments  Pt resting in bed on arrival and agreeable to session, demonstrating continued progress towards acute goals. Pt requiring cues throughout session for sequencing and initiation, as well as max cues to locate room on return from ambulation. Pt on 10L on arrival with SpO2 95% at rest, pt maintaining SpO2 88-92% on 10L throughout mobility. Pt requiring mod A to complete bed mobility and min A to boost to stand. Once standing pt demonstrating hallway gait with grossly CGA for safety with no overt LOB noted, without UE support. Pt with x2 episodes of incontinence during session with RN aware. Pt continues to benefit from skilled PT services to progress toward functional mobility goals.     If plan is discharge home, recommend the following: A little help with bathing/dressing/bathroom;A little help with walking and/or transfers;Assist for transportation;Supervision due to cognitive status;Help with stairs or ramp for entrance   Can travel by private vehicle        Equipment Recommendations  None recommended by PT    Recommendations for Other Services OT consult (cognition?)     Precautions / Restrictions Precautions Precautions: Other (comment) Recall of Precautions/Restrictions: Impaired Precaution/Restrictions Comments: watch sats Restrictions Weight Bearing Restrictions Per Provider Order: No     Mobility  Bed Mobility Overal bed mobility: Needs Assistance Bed Mobility: Supine to Sit     Supine to sit: Mod assist, HOB elevated, Used rails      General bed mobility comments: mod A to eleavte trunk    Transfers Overall transfer level: Needs assistance Equipment used: None Transfers: Sit to/from Stand Sit to Stand: Min assist           General transfer comment: multi attempts to power up    Ambulation/Gait Ambulation/Gait assistance: Contact guard assist Gait Distance (Feet): 350 Feet Assistive device: None Gait Pattern/deviations: Wide base of support, Step-through pattern, Decreased stride length Gait velocity: decreased     General Gait Details: Shoes donned for amb due to callouses bilat feet. Mildy unsteady. Desat to 88% on 10L. SpO2 95% at rest on 10L   Stairs             Wheelchair Mobility     Tilt Bed    Modified Rankin (Stroke Patients Only)       Balance Overall balance assessment: Needs assistance Sitting-balance support: Feet supported, No upper extremity supported Sitting balance-Leahy Scale: Good     Standing balance support: Single extremity supported, No upper extremity supported, During functional activity Standing balance-Leahy Scale: Fair Standing balance comment: no assistive device with hallway ambulation and to bathroom, some imbalance noted, though stood to urinate at toilet without physical assist and no LOB, and washed hands at sink                            Communication Communication Communication: No apparent difficulties  Cognition Arousal: Alert Behavior During Therapy: Flat affect   PT - Cognitive impairments: History of cognitive impairments, No family/caregiver present to determine baseline  Following commands: Impaired Following commands impaired: Follows multi-step commands with increased time, Follows one step commands with increased time    Cueing    Exercises      General Comments General comments (skin integrity, edema, etc.): SpO2 88-95% on 10L HFNC throughout session      Pertinent  Vitals/Pain Pain Assessment Pain Assessment: No/denies pain Pain Intervention(s): Monitored during session    Home Living                          Prior Function            PT Goals (current goals can now be found in the care plan section) Acute Rehab PT Goals Patient Stated Goal: home PT Goal Formulation: Patient unable to participate in goal setting Time For Goal Achievement: 09/08/23 Progress towards PT goals: Progressing toward goals    Frequency    Min 2X/week      PT Plan      Co-evaluation              AM-PAC PT "6 Clicks" Mobility   Outcome Measure  Help needed turning from your back to your side while in a flat bed without using bedrails?: A Little Help needed moving from lying on your back to sitting on the side of a flat bed without using bedrails?: A Lot Help needed moving to and from a bed to a chair (including a wheelchair)?: A Little Help needed standing up from a chair using your arms (e.g., wheelchair or bedside chair)?: A Little Help needed to walk in hospital room?: A Little Help needed climbing 3-5 steps with a railing? : Total 6 Click Score: 15    End of Session Equipment Utilized During Treatment: Gait belt;Oxygen Activity Tolerance: Patient tolerated treatment well Patient left: with call bell/phone within reach;in chair;Other (comment) (with door open and RN and NT aware of pt location) Nurse Communication: Mobility status PT Visit Diagnosis: Other abnormalities of gait and mobility (R26.89);Muscle weakness (generalized) (M62.81);Other symptoms and signs involving the nervous system (R29.898)     Time: 1610-9604 PT Time Calculation (min) (ACUTE ONLY): 32 min  Charges:    $Gait Training: 8-22 mins $Therapeutic Activity: 8-22 mins PT General Charges $$ ACUTE PT VISIT: 1 Visit                     Waver Dibiasio R. PTA Acute Rehabilitation Services Office: [phone]   Agapito Horseman 08/30/2023, 12:48 PM

## 2023-08-30 NOTE — Plan of Care (Signed)
  Problem: Clinical Measurements: Goal: Ability to maintain clinical measurements within normal limits will improve Outcome: Progressing   Problem: Elimination: Goal: Will not experience complications related to bowel motility Outcome: Progressing   Problem: Pain Managment: Goal: General experience of comfort will improve and/or be controlled Outcome: Progressing   Problem: Skin Integrity: Goal: Risk for impaired skin integrity will decrease Outcome: Progressing   Problem: Tissue Perfusion: Goal: Adequacy of tissue perfusion will improve Outcome: Progressing

## 2023-08-31 ENCOUNTER — Inpatient Hospital Stay (HOSPITAL_COMMUNITY)

## 2023-08-31 DIAGNOSIS — R008 Other abnormalities of heart beat: Secondary | ICD-10-CM | POA: Diagnosis not present

## 2023-08-31 DIAGNOSIS — J690 Pneumonitis due to inhalation of food and vomit: Secondary | ICD-10-CM | POA: Diagnosis not present

## 2023-08-31 LAB — ECHOCARDIOGRAM COMPLETE
AR max vel: 2.44 cm2
AV Peak grad: 8.8 mmHg
Ao pk vel: 1.48 m/s
Area-P 1/2: 4.86 cm2
Height: 73 in
S' Lateral: 2.7 cm
Weight: 3668.45 [oz_av]

## 2023-08-31 LAB — COMPREHENSIVE METABOLIC PANEL WITH GFR
ALT: 83 U/L — ABNORMAL HIGH (ref 0–44)
AST: 34 U/L (ref 15–41)
Albumin: 2.5 g/dL — ABNORMAL LOW (ref 3.5–5.0)
Alkaline Phosphatase: 79 U/L (ref 38–126)
Anion gap: 9 (ref 5–15)
BUN: 15 mg/dL (ref 6–20)
CO2: 31 mmol/L (ref 22–32)
Calcium: 8.8 mg/dL — ABNORMAL LOW (ref 8.9–10.3)
Chloride: 95 mmol/L — ABNORMAL LOW (ref 98–111)
Creatinine, Ser: 0.64 mg/dL (ref 0.61–1.24)
GFR, Estimated: >60 mL/min (ref >60)
Glucose, Bld: 252 mg/dL — ABNORMAL HIGH (ref 70–99)
Potassium: 3.9 mmol/L (ref 3.5–5.1)
Sodium: 135 mmol/L (ref 135–145)
Total Bilirubin: 0.9 mg/dL (ref 0.0–1.2)
Total Protein: 6.4 g/dL — ABNORMAL LOW (ref 6.5–8.1)

## 2023-08-31 LAB — CBC WITH DIFFERENTIAL/PLATELET
Abs Immature Granulocytes: 0.07 10*3/uL (ref 0.00–0.07)
Basophils Absolute: 0 10*3/uL (ref 0.0–0.1)
Basophils Relative: 1 %
Eosinophils Absolute: 0.3 10*3/uL (ref 0.0–0.5)
Eosinophils Relative: 3 %
HCT: 39.8 % (ref 39.0–52.0)
Hemoglobin: 13.5 g/dL (ref 13.0–17.0)
Immature Granulocytes: 1 %
Lymphocytes Relative: 16 %
Lymphs Abs: 1.4 10*3/uL (ref 0.7–4.0)
MCH: 31.5 pg (ref 26.0–34.0)
MCHC: 33.9 g/dL (ref 30.0–36.0)
MCV: 92.8 fL (ref 80.0–100.0)
Monocytes Absolute: 0.6 10*3/uL (ref 0.1–1.0)
Monocytes Relative: 7 %
Neutro Abs: 6.4 10*3/uL (ref 1.7–7.7)
Neutrophils Relative %: 72 %
Platelets: 179 10*3/uL (ref 150–400)
RBC: 4.29 MIL/uL (ref 4.22–5.81)
RDW: 13.2 % (ref 11.5–15.5)
WBC: 8.8 10*3/uL (ref 4.0–10.5)
nRBC: 0 % (ref 0.0–0.2)

## 2023-08-31 LAB — PHOSPHORUS: Phosphorus: 3.1 mg/dL (ref 2.5–4.6)

## 2023-08-31 LAB — GLUCOSE, CAPILLARY
Glucose-Capillary: 266 mg/dL — ABNORMAL HIGH (ref 70–99)
Glucose-Capillary: 327 mg/dL — ABNORMAL HIGH (ref 70–99)
Glucose-Capillary: 293 mg/dL — ABNORMAL HIGH (ref 70–99)
Glucose-Capillary: 249 mg/dL — ABNORMAL HIGH (ref 70–99)

## 2023-08-31 LAB — MAGNESIUM: Magnesium: 1.8 mg/dL (ref 1.7–2.4)

## 2023-08-31 LAB — STREP PNEUMONIAE URINARY ANTIGEN: Strep Pneumo Urinary Antigen: NEGATIVE

## 2023-08-31 MED ORDER — PANTOPRAZOLE SODIUM 40 MG PO TBEC
40.0000 mg | DELAYED_RELEASE_TABLET | Freq: Every day | ORAL | Status: DC
  Administered 2023-08-31 – 2023-09-14 (×15): 40 mg via ORAL
  Filled 2023-08-31 (×15): qty 1

## 2023-08-31 MED ORDER — IOHEXOL 350 MG/ML SOLN
50.0000 mL | Freq: Once | INTRAVENOUS | Status: AC | PRN
  Administered 2023-08-31: 50 mL via INTRAVENOUS

## 2023-08-31 MED ORDER — INSULIN ASPART 100 UNIT/ML IJ SOLN
4.0000 [IU] | Freq: Three times a day (TID) | INTRAMUSCULAR | Status: DC
  Administered 2023-08-31 – 2023-09-02 (×6): 4 [IU] via SUBCUTANEOUS

## 2023-08-31 MED ORDER — INSULIN GLARGINE-YFGN 100 UNIT/ML ~~LOC~~ SOLN
30.0000 [IU] | Freq: Every day | SUBCUTANEOUS | Status: DC
  Administered 2023-09-01 – 2023-09-05 (×5): 30 [IU] via SUBCUTANEOUS
  Filled 2023-08-31 (×5): qty 0.3

## 2023-08-31 NOTE — Inpatient Diabetes Management (Signed)
 Inpatient Diabetes Program Recommendations  AACE/ADA: New Consensus Statement on Inpatient Glycemic Control (2015)  Target Ranges:  Prepandial:   less than 140 mg/dL      Peak postprandial:   less than 180 mg/dL (1-2 hours)      Critically ill patients:  140 - 180 mg/dL   Lab Results  Component Value Date   GLUCAP 327 (H) 08/31/2023   HGBA1C 8.9 (H) 08/25/2023    Review of Glycemic Control  Latest Reference Range & Units 08/30/23 04:44 08/30/23 07:50 08/30/23 10:53 08/30/23 16:16 08/30/23 21:12 08/31/23 06:22 08/31/23 11:02  Glucose-Capillary 70 - 99 mg/dL 161 (H) 096 (H) 045 (H) 338 (H) 319 (H) 266 (H) 327 (H)  (H): Data is abnormally high Diabetes history: DM 2 Outpatient Diabetes medications: Metformin  1000 mg bid, Ozempic 2 mg Weekly Current orders for Inpatient glycemic control:  Semglee  20 units Daily Novolog  0-9 units tid with meals and HS   Inpatient Diabetes Program Recommendations:    Consider increasing Semglee  to 30 units daily and add Novolog  meal coverage 4 units tid with meals (hold if patient eats less than 50% or NPO).   Thanks,  Josefa Ni, RN, BC-ADM Inpatient Diabetes Coordinator Pager [phone]  (8a-5p)

## 2023-08-31 NOTE — Plan of Care (Signed)
  Problem: Education: Goal: Knowledge of General Education information will improve Description: Including pain rating scale, medication(s)/side effects and non-pharmacologic comfort measures Outcome: Progressing   Problem: Clinical Measurements: Goal: Ability to maintain clinical measurements within normal limits will improve Outcome: Progressing   Problem: Activity: Goal: Risk for activity intolerance will decrease Outcome: Progressing   Problem: Nutrition: Goal: Adequate nutrition will be maintained Outcome: Progressing   Problem: Coping: Goal: Level of anxiety will decrease Outcome: Progressing   Problem: Elimination: Goal: Will not experience complications related to bowel motility Outcome: Progressing   Problem: Safety: Goal: Ability to remain free from injury will improve Outcome: Progressing   Problem: Metabolic: Goal: Ability to maintain appropriate glucose levels will improve Outcome: Progressing   Problem: Skin Integrity: Goal: Risk for impaired skin integrity will decrease Outcome: Progressing   Problem: Tissue Perfusion: Goal: Adequacy of tissue perfusion will improve Outcome: Progressing

## 2023-08-31 NOTE — Progress Notes (Signed)
 Mobility Specialist Progress Note;   08/31/23 1030  Mobility  Activity Ambulated with assistance in hallway;Transferred from bed to chair  Level of Assistance Contact guard assist, steadying assist  Assistive Device None  Distance Ambulated (ft) 375 ft  Activity Response Tolerated well  Mobility Referral Yes  Mobility visit 1 Mobility  Mobility Specialist Start Time (ACUTE ONLY) 1030  Mobility Specialist Stop Time (ACUTE ONLY) 1055  Mobility Specialist Time Calculation (min) (ACUTE ONLY) 25 min   Pt agreeable to mobility. On 9LO2 upon arrival. Required ModA for bed mobility d/t posterior lean, MinA 2x to stand from EoB, and MinG assistance during ambulation. 1 LOB noted upon standing requiring assistance to correct. Ambulated on 10LO2, VSS throughout. No c/o when asked. Pt transferred to chair at [facility] with all needs met. Alarm on.   Janit Meline Mobility Specialist Please contact via SecureChat or [employer] [phone]

## 2023-08-31 NOTE — Progress Notes (Signed)
 Echocardiogram 2D Echocardiogram has been performed.  Bradley Mcclure 08/31/2023, 4:13 PM

## 2023-08-31 NOTE — Progress Notes (Signed)
 PROGRESS NOTE    DECOREY Mcclure  WGN:[id number] DOB: [birth year] DOA: 08/24/2023 PCP: [name]  Chief Complaint  Patient presents with   Choking    Brief Narrative:   63 yo  with hx PE, T2DM, hx MRSA bacteremia, hx dysphagia and cricopharyngeal narrowing and multiple other medical issues who presented after an aspiration event while eating Bradshaw Minihan sandwich at work.   Assessment & Plan:   Principal Problem:   Aspiration into airway Active Problems:   Uncontrolled type 2 diabetes mellitus with hyperglycemia, with long-term current use of insulin  (HCC)   SIRS (systemic inflammatory response syndrome) (HCC)   Oropharyngeal dysphagia   Acute hypoxic respiratory failure (HCC)   Aspiration pneumonitis (HCC)  Aspiration Pneumonia  Multifocal Pneumonia Acute Hypoxic Respiratory Failure CXR 5/8 with mild patchy and ill defined nodular densities in the mid right lung and left mid and lower lung (needs repeat cxr in 4-6 weeks to ensure resolution) Last fever 5/13 CXR 5/13 with improving L lung pneumonia and persistent R upper lobe pneumonia Continued significant O2 needs, on 10 L HFNC  CT chest 5/15 with multifocal pneumonia  Currently on abx with augmentin  for now, will add vanc with lack of improvement.  Notably positive MRSA PCR. Urine strep, urine legionella.  Sputum cx if able to collect.   Dysphagia SLP eval, appreciate assistance - presumed osteophytes projecting into the pharynx and preventing epiglottic inversion with liquids  CT with prominent anteriorly oriented disc osteophytes at C2-3 result in mild impression upon posterior hypopharynx Per discussion with SLP, osteophytes prevent epiglottic inversion with liquids - risk for aspiration Not clear if anything to do, will discuss with nsgy continue current diet with use of chin tuck to prevent penetration.   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 Severe bilateral neuroforaminal narrowing at C5-6 and more moderate bilateral neuroforaminal narrowing at C6-7.   Outpatient nsgy follow up   T2DM At home on metformin , semaglutide Adjust basal/bolus regimen prn  Sinus Tach Follow on his chronic metop   Hx PE  Xarelto    Depression Abilify , prozac  Cognitive Impairment Unclear baseline    DVT prophylaxis: xarelto  Code Status: full Family Communication: none Disposition:   Status is: Inpatient Remains inpatient appropriate because: need for ongoing inpatient care with hypoxia   Consultants:  none  Procedures:  none  Antimicrobials:  Anti-infectives (From admission, onward)    Start     Dose/Rate Route Frequency Ordered Stop   08/31/23 0700  vancomycin  (VANCOREADY) IVPB 750 mg/150 mL        750 mg 150 mL/hr over 60 Minutes Intravenous Every 12 hours 08/30/23 1837     08/30/23 1930  vancomycin  (VANCOREADY) IVPB 2000 mg/400 mL        2,000 mg 200 mL/hr over 120 Minutes Intravenous  Once 08/30/23 1836 08/30/23 2202   08/29/23 1000  amoxicillin -clavulanate (AUGMENTIN ) 875-125 MG per tablet 1 tablet        1 tablet Oral Every 12 hours 08/29/23 0836 09/01/23 0959   08/25/23 1000  Ampicillin-Sulbactam (UNASYN) 3 g in sodium chloride  0.9 % 100 mL IVPB  Status:  Discontinued  3 g 200 mL/hr over 30 Minutes Intravenous Every 6 hours 08/25/23 0859 08/29/23 0836       Subjective: No new complaints Asking about going home,r eturn to work (he's not close to ready on 10 L)  Objective: Vitals:   08/31/23 0345 08/31/23 0346 08/31/23 0717 08/31/23 1059  BP: 114/64  131/72 135/72  Pulse: 87  90 97  Resp: (!) 24  20 (!) 22  Temp:  98.7 F (37.1 C)  97.9 F (36.6 C) 97.9 F (36.6 C)  TempSrc: Oral Oral Oral Oral  SpO2: 93%  90% (!) 89%  Weight:      Height:        Intake/Output Summary (Last 24 hours) at 08/31/2023 1525 Last data filed at 08/31/2023 1020 Gross per 24 hour  Intake 406.96 ml  Output 2125 ml  Net -1718.04 ml   Filed Weights   08/24/23 2000 08/28/23 0400 08/29/23 0345  Weight: 106.6 kg 106 kg 104 kg    Examination:  General: No acute distress. Sitting up in chair.  Cardiovascular: RRR Lungs: limited anterior exam with diminished lung sounds, no increased WOB Abdomen: Soft, nontender, nondistended  Neurological: Alert and oriented 3. Moves all extremities 4 with equal strength. Cranial nerves II through XII grossly intact. Extremities: No clubbing or cyanosis. No edema.    Data Reviewed: I have personally reviewed following labs and imaging studies  CBC: Recent Labs  Lab 08/27/23 0519 08/28/23 0301 08/29/23 0143 08/30/23 0254 08/31/23 0333  WBC 13.0* 9.7 8.6 9.1 8.8  NEUTROABS 11.6* 7.9* 6.9 7.2 6.4  HGB 13.4 12.6* 13.4 13.5 13.5  HCT 39.0 36.9* 38.6* 39.3 39.8  MCV 91.8 93.4 92.1 91.8 92.8  PLT 107* 116* 151 155 179    Basic Metabolic Panel: Recent Labs  Lab 08/25/23 0551 08/26/23 1043 08/27/23 0519 08/28/23 0301 08/29/23 0359 08/30/23 0737 08/31/23 0333  NA 135   < > 135 133* 135 135 135  K 4.5   < > 3.5 3.3* 3.3* 3.2* 3.9  CL 100   < > 98 96* 96* 93* 95*  CO2 20*   < > 26 26 28 30 31   GLUCOSE 350*   < > 155* 120* 175* 187* 252*  BUN 17   < > 13 12 15 14 15   CREATININE 0.84   < > 0.57* 0.57* 0.66 0.61 0.64  CALCIUM 9.3   < > 8.8* 8.6* 8.5* 8.7* 8.8*  MG 1.2*  --   --   --   --   --  1.8  PHOS 2.9  --   --   --   --   --  3.1   < > = values in this interval not displayed.    GFR: Estimated Creatinine Clearance: 124.3 mL/min (by C-G formula based on SCr of 0.64 mg/dL).  Liver Function Tests: Recent Labs  Lab 08/24/23 1745 08/30/23 0737 08/31/23 0333   AST 29 46* 34  ALT 37 101* 83*  ALKPHOS 51 78 79  BILITOT 0.9 1.2 0.9  PROT 6.8 6.4* 6.4*  ALBUMIN 3.7 2.5* 2.5*    CBG: Recent Labs  Lab 08/30/23 1053 08/30/23 1616 08/30/23 2112 08/31/23 0622 08/31/23 1102  GLUCAP 348* 338* 319* 266* 327*     Recent Results (from the past 240 hours)  Resp panel by RT-PCR (RSV, Flu Sumaiyah Markert&B, Covid) Anterior Nasal Swab     Status: None   Collection Time: 08/24/23  8:10 PM   Specimen: Anterior Nasal Swab  Result Value  Ref Range Status   SARS Coronavirus 2 by RT PCR NEGATIVE NEGATIVE Final   Influenza Kendell Sagraves by PCR NEGATIVE NEGATIVE Final   Influenza B by PCR NEGATIVE NEGATIVE Final    Comment: (NOTE) The Xpert Xpress SARS-CoV-2/FLU/RSV plus assay is intended as an aid in the diagnosis of influenza from Nasopharyngeal swab specimens and should not be used as Freddie Nghiem sole basis for treatment. Nasal washings and aspirates are unacceptable for Xpert Xpress SARS-CoV-2/FLU/RSV testing.  Fact Sheet for Patients: BloggerCourse.com  Fact Sheet for Healthcare Providers: SeriousBroker.it  This test is not yet approved or cleared by the [place]  FDA and has been authorized for detection and/or diagnosis of SARS-CoV-2 by FDA under an Emergency Use Authorization (EUA). This EUA will remain in effect (meaning this test can be used) for the duration of the COVID-19 declaration under Section 564(b)(1) of the Act, 21 U.S.C. section 360bbb-3(b)(1), unless the authorization is terminated or revoked.     Resp Syncytial Virus by PCR NEGATIVE NEGATIVE Final    Comment: (NOTE) Fact Sheet for Patients: BloggerCourse.com  Fact Sheet for Healthcare Providers: SeriousBroker.it  This test is not yet approved or cleared by the [place]  FDA and has been authorized for detection and/or diagnosis of SARS-CoV-2 by FDA under an Emergency Use Authorization (EUA). This  EUA will remain in effect (meaning this test can be used) for the duration of the COVID-19 declaration under Section 564(b)(1) of the Act, 21 U.S.C. section 360bbb-3(b)(1), unless the authorization is terminated or revoked.  Performed at [facility], [address], [place], [place] [address]   MRSA Next Gen by PCR, Nasal     Status: Abnormal   Collection Time: 08/25/23  2:40 PM   Specimen: Nasal Mucosa; Nasal Swab  Result Value Ref Range Status   MRSA by PCR Next Gen DETECTED (Amada Hallisey) NOT DETECTED Final    Comment: RESULT CALLED TO, READ BACK BY AND VERIFIED WITH: RN Amedeo Bailiff on [phone] @1715  by SM (NOTE) The GeneXpert MRSA Assay (FDA approved for NASAL specimens only), is one component of Kiyoshi Schaab comprehensive MRSA colonization surveillance program. It is not intended to diagnose MRSA infection nor to guide or monitor treatment for MRSA infections. Test performance is not FDA approved in patients less than 35 years old. Performed at [facility], [address] [place], [place] [address]          Radiology Studies: CT CHEST W CONTRAST Result Date: 08/31/2023 CLINICAL DATA:  Multifocal pneumonia EXAM: CT CHEST WITH CONTRAST TECHNIQUE: Multidetector CT imaging of the chest was performed during intravenous contrast administration. RADIATION DOSE REDUCTION: This exam was performed according to the departmental dose-optimization program which includes automated exposure control, adjustment of the mA and/or kV according to patient size and/or use of iterative reconstruction technique. CONTRAST:  50mL OMNIPAQUE  IOHEXOL  350 MG/ML SOLN COMPARISON:  Chest radiograph dated 08/29/2023, CT chest dated 09/10/2022 FINDINGS: Decreased sensitivity and specificity for detailed findings due to motion artifact. Cardiovascular: Normal heart size. No significant pericardial fluid/thickening. Great vessels are normal in course and caliber. No central pulmonary emboli. Coronary artery calcifications  and aortic atherosclerosis. Mediastinum/Nodes: Imaged thyroid gland without nodules meeting criteria for imaging follow-up by size. Mild circumferential mural thickening of the esophagus. 12 mm right hilar lymph node (3:55) and 11 mm left hilar lymph node (3:56). Lungs/Pleura: The central airways are patent. Moderate diffuse bronchial wall thickening. Diffuse, multifocal ground-glass opacities and irregular consolidations. No pneumothorax. Trace bilateral pleural effusions. Upper abdomen: Mildly enlarged spleen  measures 13.9 cm. Musculoskeletal: No acute or abnormal lytic or blastic osseous lesions. IMPRESSION: 1. Diffuse, multifocal ground-glass opacities and irregular consolidations, likely multifocal pneumonia. 2. Trace bilateral pleural effusions. 3. Mild circumferential mural thickening of the esophagus, which can be seen in the setting of esophagitis. 4. Mildly enlarged bilateral hilar lymph nodes, likely reactive. 5. Aortic Atherosclerosis (ICD10-I70.0). Coronary artery calcifications. Assessment for potential risk factor modification, dietary therapy or pharmacologic therapy may be warranted, if clinically indicated. Electronically Signed   By: Limin  Xu M.D.   On: 08/31/2023 12:42   CT SOFT TISSUE NECK WO CONTRAST Result Date: 08/30/2023 CLINICAL DATA:  Dysphagia EXAM: CT NECK WITHOUT CONTRAST TECHNIQUE: Multidetector CT imaging of the neck was performed following the standard protocol without intravenous contrast. RADIATION DOSE REDUCTION: This exam was performed according to the departmental dose-optimization program which includes automated exposure control, adjustment of the mA and/or kV according to patient size and/or use of iterative reconstruction technique. COMPARISON:  None Available. FINDINGS: Pharynx and larynx: Prominent disc osteophytes at C2-3 result in mild impression upon the posterior hypopharynx without significant luminal compromise. No mass lesion or soft tissue swelling identified.  Salivary glands: Thinning of the overlying subcutaneous fat and subdermal infiltration superficial to the left parotid gland in keeping with remote trauma or postsurgical change. The parotid and submandibular salivary glands are unremarkable. Thyroid: Normal. Lymph nodes: None enlarged or abnormal density. Vascular: Mild atherosclerotic calcification within the left carotid bifurcation. Limited intracranial: Negative. Visualized orbits: Right scleral banding. Right ocular lens has been removed. No acute abnormality. Mastoids and visualized paranasal sinuses: Clear. Skeleton: Segmentation anomaly C2-3 with incomplete segmentation. Intervertebral disc space narrowing and endplate remodeling is seen throughout the remainder of the cervical spine in keeping with changes of moderate degenerative disc disease. As noted above, prominent anteriorly oriented disc osteophytes at C2-3 result in mild depression upon the posterior hypopharynx. 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 Milder changes related to posterior disc osteophyte complex ease are seen at C5-6 and C6-7. Uncovertebral arthrosis results in severe bilateral neuroforaminal narrowing at C5-6 and more moderate bilateral neuroforaminal narrowing at C6-7. No acute abnormality. Upper chest: Biapical airspace infiltrate, asymmetrically more severe within the right apex is better seen on prior CT examination of the chest and is compatible with multifocal pneumonic infiltrate. Other: None IMPRESSION: 1. Prominent anteriorly oriented disc osteophytes at C2-3 result in mild impression upon the posterior hypopharynx without significant luminal compromise. 2. 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 3. Severe bilateral neuroforaminal narrowing at C5-6 and more moderate bilateral neuroforaminal narrowing at C6-7. 4. Biapical airspace infiltrate, asymmetrically more severe within the right apex is better seen on prior CT examination of the chest and is compatible with multifocal pneumonic infiltrate. 5. Thinning of the overlying subcutaneous fat and subdermal infiltration superficial to the left parotid gland in keeping with remote trauma or postsurgical change. Electronically Signed   By: Worthy Heads M.D.   On: 08/30/2023 23:45   DG Swallowing Func-Speech Pathology Result Date: 08/30/2023 Table formatting from the original result was not included. Images from the original result were not included. Modified Barium Swallow Study Patient Details Name: Bradley Mcclure  Vanwagoner MRN: [id number] Date of Birth: Jun 30, 1960 Today's Date: 08/30/2023 HPI/PMH: HPI: KOLETON ARCIA is Yanira Tolsma 63 y.o. male veteran, with hx of esophageal dysphagia (? Prior cricopharyngeal narrowing), PE on anticoagulation, diabetes type 2, hypertension, hyperlipidemia, NAFLD, depression, cognitive impairment, GERD, who presented after an episode of aspiration.  Reports that around 4 PM he was eating Khaleef Ruby roast beef sandwich and felt that it was moving slowly through his oropharynx, points at his upper neck.  He started "choking" and coughing.  Drank Lanorris Kalisz soda which felt this helped clear the food and he started breathing better.  He denies any issues with dysphagia prior to today.  Otherwise reports wheezing for the past 3 days although no history of asthma or COPD, smoking.  Denies any recent fever, chills, cough, rhinorrhea, sore throat, sick contacts.  No chest pain or shortness of breath.  Denies any prior treatment for dysphagia. [facility] history [facility] dysphagia with?  Cricopharyngeal narrowing and 2017. No prior endoscopy to his  knowledge. Clinical Impression: Pt presents with Stark Aguinaga mild-moderate oropharyngeal dysphagia c/b reduced base of tongue retraction, incomplete laryngeal closure, absent epiglottic inversion with liquid and diminished sensation.  These deficits resulted in deep, silent penetration of thin and nectar thick liquids.  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  Pt was able to acheive full epiglottic inversion with heavier bolus trials (puree, solid).  Pt's cued cough is fairly weak.  Cued cough and secondary swallow were partially beneficial for clearance of penetration.  Pt's cough is weak sounding and lacking crispness.  Chin tuck prevented penetration of thin liquid.  It is difficult to determine acute v chronic nature of pt's current presentation.  Presumed osteophytes are not acute, but it appears pt has been able to compensate for their presence prior to this admission.  And this may explain cricopharyngeal narrowing [facility] as in addition to pharyngeal projections at C2-3 there are further presumed osteophytes at the level of the UES.  Bolus was able to pass through and on esophageal sweep there was no retention of contrast.  Would consider consult to address structural issues affecting swallow as pt is relatively young and this will continue to place him at higher risk for aspiration. During pill simulation pill was given whole with puree as compensatory strategies can be difficult to coordinate with tablet and liquid wash.  Initially pt chewed tablet, but on second attempt was able to swallow tablet whole with puree. Recommend continuing regular texture diet with thin liquid, with use of CHIN TUCK. Administer medications whole with puree. DIGEST Swallow Severity Rating*  Safety: 2  Efficiency: 0  Overall Pharyngeal Swallow Severity: 2 1: mild; 2: moderate; 3:  severe; 4: profound *The Dynamic Imaging Grade of Swallowing Toxicity is standardized for the head and neck cancer population, however, demonstrates promising clinical applications across populations to standardize the clinical rating of pharyngeal swallow safety and severity. 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 inversion: Factors that may increase risk of adverse event in presence of aspiration Roderick Civatte & Jessy Morocco 2021): Respiratory or GI disease Swallow Evaluation Recommendations Recommendations: PO diet PO Diet Recommendation: Regular;Thin liquids (Level 0) Liquid Administration via: Cup;Straw Medication Administration: Whole meds with puree Supervision: Intermittent supervision/cueing for swallowing strategies Swallowing strategies  : Chin tuck; Slow rate; Small bites/sips Postural changes: Position pt fully upright for meals Recommended consults:  Consider consult to address structural impairments to swallow (i.e. cervical osteophytes) Treatment Plan Treatment Plan Treatment recommendations: Therapy as outlined in treatment plan below Follow-up recommendations: -- (ST at next level of care) Functional status assessment: Patient has had Kennth Vanbenschoten recent decline in their functional status and demonstrates the ability to make significant improvements in function in Madina Galati reasonable and predictable amount of time. Treatment frequency: Min 2x/week Treatment duration: 2 weeks Interventions: Respiratory muscle strength training; Compensatory techniques; Diet toleration management by SLP; Aspiration precaution training Recommendations Recommendations for follow up therapy are one component of Anishka Bushard multi-disciplinary discharge planning process, led by the attending physician.  Recommendations may be updated based on patient status, additional functional criteria and insurance authorization. Assessment: Orofacial Exam: Orofacial Exam Oral Cavity: Oral Hygiene: WFL Oral Cavity - Dentition: Adequate natural dentition  Orofacial Anatomy: WFL Oral Motor/Sensory Function: -- (See BSE) Anatomy: Anatomy: Suspected cervical osteophytes Boluses Administered: Boluses Administered Boluses Administered: Thin liquids (Level 0); Mildly thick liquids (Level 2, nectar thick); Moderately thick liquids (Level 3, honey thick); Puree; Solid  Oral Impairment Domain: Oral Impairment Domain Lip Closure: No labial escape Tongue control during bolus hold: Cohesive bolus between tongue to palatal seal Bolus preparation/mastication: Timely and efficient chewing and mashing Bolus transport/lingual motion: Brisk tongue motion Oral residue: Complete oral clearance Location of oral residue : N/Jaydah Stahle Initiation of pharyngeal swallow : Posterior angle of the ramus  Pharyngeal Impairment Domain: Pharyngeal Impairment Domain Soft palate elevation: No bolus between soft palate (SP)/pharyngeal wall (PW) Laryngeal elevation: Complete superior movement of thyroid cartilage with complete approximation of arytenoids to epiglottic petiole Anterior hyoid excursion: Complete anterior movement Epiglottic movement: No inversion (With liquids.  Complete inversion with solids) Pharyngeal stripping wave : Present - diminished Pharyngeal contraction (Kahmari Herard/P view only): N/Solly Derasmo Pharyngoesophageal segment opening: Partial distention/partial duration, partial obstruction of flow Tongue base retraction: Trace column of contrast or air between tongue base and PPW Pharyngeal residue: Collection of residue within or on pharyngeal structures Location of pharyngeal residue: Valleculae  Esophageal Impairment Domain: Esophageal Impairment Domain Esophageal clearance upright position: Complete clearance, esophageal coating Pill: Pill Consistency administered: Puree Puree: WFL Penetration/Aspiration Scale Score: Penetration/Aspiration Scale Score 1.  Material does not enter airway: Moderately thick liquids (Level 3, honey thick); Puree; Solid; Pill 3.  Material enters airway, remains ABOVE vocal  cords and not ejected out: Thin liquids (Level 0); Mildly thick liquids (Level 2, nectar thick) Compensatory Strategies: Compensatory Strategies Compensatory strategies: Yes Chin tuck: Effective Effective Chin Tuck: Thin liquid (Level 0) Other(comment): -- (Cued cough and re-swallow, partially effective)   General Information: Caregiver present: No  Diet Prior to this Study: Regular; Thin liquids (Level 0)   No data recorded  Respiratory Status: WFL   Supplemental O2: None (Room air) (Despite requiring 10L by HFNC at bedside)   No data recorded Behavior/Cognition: Alert; Cooperative Self-Feeding Abilities: Able to self-feed Baseline vocal quality/speech: Normal Volitional Cough: Able to elicit No data recorded Exam Limitations: No limitations Goal Planning: Prognosis for improved oropharyngeal function: Fair Barriers to Reach Goals: -- (Structural changes affecting swallow function) No data recorded No data recorded Consulted and agree with results and recommendations: Patient; Nurse Pain: Pain Assessment Faces Pain Scale: 0 Breathing: 0 End of Session: Start Time:SLP Start Time (ACUTE ONLY): 0919 Stop Time: SLP Stop Time (ACUTE ONLY): 0926 Time Calculation:SLP Time Calculation (min) (ACUTE ONLY): 7 min Charges: SLP Evaluations $ SLP Speech Visit: 1 Visit SLP Evaluations $Swallowing Treatment: 1 Procedure SLP visit diagnosis: SLP Visit Diagnosis: Dysphagia, oropharyngeal phase (  R13.12) Past Medical History: Past Medical History: Diagnosis Date  Balanitis 12/24/2015  Cellulitis and abscess 12/24/2015  Generalized anxiety disorder 09/06/2022  Hyperlipidemia 09/06/2022  Hypertension   IBS (irritable bowel syndrome)   Major depressive disorder 09/06/2022  Mild cognitive disorder 09/06/2022  Dec 07, 2016 Entered By: Rosalita Combe Comment: Medical Disabilty 12.2017 -Jul 10, 2018 Entered By: Aloma Jaksch Comment: diagnosed at [facility]  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  Non-alcoholic fatty liver disease 04/19/2011  Dec 07, 2016 Entered By: Rosalita Combe Comment: Urged to modify lifestyle &amp; Urged again 2018 to begin Coffee 2-4/d for hepatoprotection  Pneumonia 2014-2015 X 1  Pulmonary embolism (HCC) 03/21/2013  Sepsis (HCC) 11/11/2015  Type II diabetes mellitus (HCC)  Past Surgical History: Past Surgical History: Procedure Laterality Date  TEE WITHOUT CARDIOVERSION N/Barnell Shieh 11/17/2015  Procedure: TRANSESOPHAGEAL ECHOCARDIOGRAM (TEE);  Surgeon: Maudine Sos, MD;  Location[facility] ENDOSCOPY;  Service: Cardiovascular;  Laterality: N/Katherleen Folkes;  TONSILLECTOMY  ~ 1977 Elester Grim, MA, CCC-SLP Acute Rehabilitation Services Office: [phone] 08/30/2023, 11:24 AM       Scheduled Meds:  albuterol   2.5 mg Nebulization BID   amoxicillin -clavulanate  1 tablet Oral Q12H   ARIPiprazole   2 mg Oral Daily   furosemide   40 mg Intravenous Q12H   insulin  aspart  0-5 Units Subcutaneous QHS   insulin  aspart  0-9 Units Subcutaneous TID WC   insulin  aspart  4 Units Subcutaneous TID WC   [START ON 09/01/2023] insulin  glargine-yfgn  30 Units Subcutaneous Daily   metoprolol  tartrate  25 mg Oral BID   pantoprazole   40 mg Oral Daily   polyethylene glycol  17 g Oral BID   rivaroxaban   20 mg Oral Q supper   senna  1 tablet Oral QHS   Continuous Infusions:  vancomycin  150 mL/hr at 08/31/23 0635     LOS: 7 days    Time spent: over 30 min     Donnetta Gains, MD [facility]   To contact the attending provider between 7A-7P or the covering provider during after hours 7P-7A, please log into the web site [email or web address] and access using universal [place] password for that web site. If you do not have the password, please call the hospital operator.  08/31/2023, 3:25 PM

## 2023-08-31 NOTE — Plan of Care (Signed)
  Problem: Clinical Measurements: Goal: Ability to maintain clinical measurements within normal limits will improve Outcome: Progressing   Problem: Nutrition: Goal: Adequate nutrition will be maintained Outcome: Progressing   Problem: Fluid Volume: Goal: Ability to maintain a balanced intake and output will improve Outcome: Progressing   Problem: Nutritional: Goal: Maintenance of adequate nutrition will improve Outcome: Progressing

## 2023-09-01 DIAGNOSIS — J690 Pneumonitis due to inhalation of food and vomit: Secondary | ICD-10-CM | POA: Diagnosis not present

## 2023-09-01 LAB — CBC WITH DIFFERENTIAL/PLATELET
Abs Immature Granulocytes: 0.08 10*3/uL — ABNORMAL HIGH (ref 0.00–0.07)
Basophils Absolute: 0 10*3/uL (ref 0.0–0.1)
Basophils Relative: 0 %
Eosinophils Absolute: 0.2 10*3/uL (ref 0.0–0.5)
Eosinophils Relative: 2 %
HCT: 39.3 % (ref 39.0–52.0)
Hemoglobin: 13.3 g/dL (ref 13.0–17.0)
Immature Granulocytes: 1 %
Lymphocytes Relative: 15 %
Lymphs Abs: 1.4 10*3/uL (ref 0.7–4.0)
MCH: 31.2 pg (ref 26.0–34.0)
MCHC: 33.8 g/dL (ref 30.0–36.0)
MCV: 92.3 fL (ref 80.0–100.0)
Monocytes Absolute: 0.5 10*3/uL (ref 0.1–1.0)
Monocytes Relative: 6 %
Neutro Abs: 7.4 10*3/uL (ref 1.7–7.7)
Neutrophils Relative %: 76 %
Platelets: 197 10*3/uL (ref 150–400)
RBC: 4.26 MIL/uL (ref 4.22–5.81)
RDW: 12.9 % (ref 11.5–15.5)
WBC: 9.6 10*3/uL (ref 4.0–10.5)
nRBC: 0 % (ref 0.0–0.2)

## 2023-09-01 LAB — COMPREHENSIVE METABOLIC PANEL WITH GFR
ALT: 71 U/L — ABNORMAL HIGH (ref 0–44)
AST: 35 U/L (ref 15–41)
Albumin: 2.5 g/dL — ABNORMAL LOW (ref 3.5–5.0)
Alkaline Phosphatase: 80 U/L (ref 38–126)
Anion gap: 8 (ref 5–15)
BUN: 17 mg/dL (ref 6–20)
CO2: 32 mmol/L (ref 22–32)
Calcium: 8.9 mg/dL (ref 8.9–10.3)
Chloride: 94 mmol/L — ABNORMAL LOW (ref 98–111)
Creatinine, Ser: 0.63 mg/dL (ref 0.61–1.24)
GFR, Estimated: >60 mL/min (ref >60)
Glucose, Bld: 280 mg/dL — ABNORMAL HIGH (ref 70–99)
Potassium: 3.6 mmol/L (ref 3.5–5.1)
Sodium: 134 mmol/L — ABNORMAL LOW (ref 135–145)
Total Bilirubin: 1.2 mg/dL (ref 0.0–1.2)
Total Protein: 6.7 g/dL (ref 6.5–8.1)

## 2023-09-01 LAB — PHOSPHORUS: Phosphorus: 2.7 mg/dL (ref 2.5–4.6)

## 2023-09-01 LAB — MAGNESIUM: Magnesium: 1.6 mg/dL — ABNORMAL LOW (ref 1.7–2.4)

## 2023-09-01 LAB — GLUCOSE, CAPILLARY
Glucose-Capillary: 262 mg/dL — ABNORMAL HIGH (ref 70–99)
Glucose-Capillary: 274 mg/dL — ABNORMAL HIGH (ref 70–99)
Glucose-Capillary: 262 mg/dL — ABNORMAL HIGH (ref 70–99)
Glucose-Capillary: 264 mg/dL — ABNORMAL HIGH (ref 70–99)
Glucose-Capillary: 254 mg/dL — ABNORMAL HIGH (ref 70–99)

## 2023-09-01 MED ORDER — MAGNESIUM SULFATE 2 GM/50ML IV SOLN
2.0000 g | Freq: Once | INTRAVENOUS | Status: AC
  Administered 2023-09-01: 2 g via INTRAVENOUS
  Filled 2023-09-01: qty 50

## 2023-09-01 MED ORDER — POTASSIUM CHLORIDE CRYS ER 20 MEQ PO TBCR
40.0000 meq | EXTENDED_RELEASE_TABLET | ORAL | Status: AC
  Administered 2023-09-01 (×2): 40 meq via ORAL
  Filled 2023-09-01 (×2): qty 2

## 2023-09-01 NOTE — Progress Notes (Signed)
 Mobility Specialist: Progress Note   09/01/23 1545  Mobility  Activity Ambulated with assistance in hallway  Level of Assistance Contact guard assist, steadying assist  Assistive Device None  Distance Ambulated (ft) 500 ft  Activity Response Tolerated well  Mobility Referral Yes  Mobility visit 1 Mobility  Mobility Specialist Start Time (ACUTE ONLY) 1455  Mobility Specialist Stop Time (ACUTE ONLY) 1510  Mobility Specialist Time Calculation (min) (ACUTE ONLY) 15 min    During Mobility: SpO2 87-90% 10LO2 Post Mobility: SpO2 94% 10LO2  Pt was agreeable to mobility session - received in bed. MinG throughout. Mild unsteadiness throughout ambulation. No complaints. Returned to room without fault. Left in bed with all needs met, call bell in reach.   Deloria Fetch Mobility Specialist Please contact via SecureChat or Rehab office at [phone]

## 2023-09-01 NOTE — Inpatient Diabetes Management (Signed)
 Inpatient Diabetes Program Recommendations  AACE/ADA: New Consensus Statement on Inpatient Glycemic Control (2015)  Target Ranges:  Prepandial:   less than 140 mg/dL      Peak postprandial:   less than 180 mg/dL (1-2 hours)      Critically ill patients:  140 - 180 mg/dL   Lab Results  Component Value Date   GLUCAP 274 (H) 09/01/2023   HGBA1C 8.9 (H) 08/25/2023    Review of Glycemic Control  Latest Reference Range & Units 08/30/23 21:12 08/31/23 06:22 08/31/23 11:02 08/31/23 15:45 08/31/23 21:32 09/01/23 06:14 09/01/23 10:37  Glucose-Capillary 70 - 99 mg/dL 782 (H) 956 (H) 213 (H) 293 (H) 249 (H) 262 (H) 274 (H)   Diabetes history: DM 2 Outpatient Diabetes medications:  Metformin  1000 mg bid Semaglutide 2 mg weekly (Patient states he also takes Lantus  18 units daily?) Current orders for Inpatient glycemic control:  Novolog  0-9 units tid with meals and HS Semglee  30 units daily Novolog  4 units tid with meals   Inpatient Diabetes Program Recommendations:    Attempted to speak with patient at bedside regarding DM management.  He was very sleepy and would open eyes off and on.  He did confirm that he takes Lantus  18 units daily at home?  Briefly discussed A1C with patient.  He was tired.  Encouraged close f/u with PCP.    Thanks,  Josefa Ni, RN, BC-ADM Inpatient Diabetes Coordinator Pager [phone]  (8a-5p)

## 2023-09-01 NOTE — Progress Notes (Addendum)
 PROGRESS NOTE    Bradley Mcclure  ZOX:[id number] DOB: June 04, 1960 DOA: 08/24/2023 PCP: [name]  Chief Complaint  Patient presents with   Choking    Brief Narrative:   63 yo  with hx PE, T2DM, hx MRSA bacteremia, hx dysphagia and cricopharyngeal narrowing and multiple other medical issues who presented after an aspiration event while eating Bradley Mcclure sandwich at work.   Assessment & Plan:   Principal Problem:   Aspiration into airway Active Problems:   Uncontrolled type 2 diabetes mellitus with hyperglycemia, with long-term current use of insulin  (HCC)   SIRS (systemic inflammatory response syndrome) (HCC)   Oropharyngeal dysphagia   Acute hypoxic respiratory failure (HCC)   Aspiration pneumonitis (HCC)  Aspiration Pneumonia  Multifocal Pneumonia Acute Hypoxic Respiratory Failure CXR 5/8 with mild patchy and ill defined nodular densities in the mid right lung and left mid and lower lung (needs repeat cxr in 4-6 weeks to ensure resolution) Last fever 5/13 CXR 5/13 with improving L lung pneumonia and persistent R upper lobe pneumonia Continued significant O2 needs, on 9-10 L HFNC -> wean as tolerated - if unable to wean will consult pulmonology  CT chest 5/15 with multifocal pneumonia  S/p 7 days coverage for aspiration pneumonia with unasyn/augmentin , will add vanc with lack of improvement.  Notably positive MRSA PCR. Urine strep - negative, urine legionella pending.  Sputum cx if able to collect.   Dysphagia SLP eval, appreciate assistance - presumed osteophytes projecting into the pharynx and preventing epiglottic inversion with liquids  CT with prominent anteriorly oriented disc osteophytes at C2-3 result in mild impression upon posterior hypopharynx Per discussion with SLP, osteophytes prevent epiglottic inversion with liquids - risk for aspiration Not clear if anything to do, will discuss with nsgy (no surgical indication/recommendation) continue current diet with  use of chin tuck to prevent penetration.   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 Severe bilateral neuroforaminal narrowing at C5-6 and more moderate bilateral neuroforaminal narrowing at C6-7.   Outpatient nsgy follow up   T2DM At home on metformin , semaglutide Adjust basal/bolus regimen prn  Sinus Tach Follow on his chronic metop   Hx PE  Xarelto    Depression Abilify , prozac  Cognitive Impairment Unclear baseline    DVT prophylaxis: xarelto  Code Status: full Family Communication: none Disposition:   Status is: Inpatient Remains inpatient appropriate because: need for ongoing inpatient care with hypoxia   Consultants:  none  Procedures:  none  Antimicrobials:  Anti-infectives (From admission, onward)    Start     Dose/Rate Route Frequency Ordered Stop   08/31/23 0700  vancomycin  (VANCOREADY) IVPB 750 mg/150 mL        750 mg 150 mL/hr over 60 Minutes Intravenous Every 12 hours 08/30/23 1837     08/30/23 1930  vancomycin  (VANCOREADY) IVPB 2000 mg/400 mL        2,000 mg 200 mL/hr over 120 Minutes Intravenous  Once 08/30/23 1836 08/30/23 2202   08/29/23 1000  amoxicillin -clavulanate (AUGMENTIN ) 875-125 MG per tablet 1 tablet        1 tablet Oral Every 12 hours 08/29/23  2536 08/31/23 2118   08/25/23 1000  Ampicillin-Sulbactam (UNASYN) 3 g in sodium chloride  0.9 % 100 mL IVPB  Status:  Discontinued        3 g 200 mL/hr over 30 Minutes Intravenous Every 6 hours 08/25/23 0859 08/29/23 0836       Subjective: No complaints  Objective: Vitals:   09/01/23 0542  09/01/23 0740 09/01/23 1055 09/01/23 1615  BP: 113/65 (!) 119/58 123/69 116/64  Pulse: 86 85 93 85  Resp: 20 18 20 12   Temp: 98.7 F (37.1 C) 98.4 F (36.9 C) 98 F (36.7 C) 98.2 F (36.8 C)  TempSrc: Oral Oral Oral Oral  SpO2: 93%     Weight: 102.5 kg     Height:        Intake/Output Summary (Last 24 hours) at 09/01/2023 1641 Last data filed at 09/01/2023 1614 Gross per 24 hour  Intake 733.18 ml  Output 2400 ml  Net -1666.82 ml   Filed Weights   08/28/23 0400 08/29/23 0345 09/01/23 0542  Weight: 106 kg 104 kg 102.5 kg    Examination:  General: No acute distress. Cardiovascular: RRR Lungs: unlabored, on 10 L - diminished anterior exam Abdomen: Soft, nontender, nondistended Neurological: Alert and oriented 3. Moves all extremities 4 with equal strength. Cranial nerves II through XII grossly intact. Extremities: No clubbing or cyanosis. No edema.   Data Reviewed: I have personally reviewed following labs and imaging studies  CBC: Recent Labs  Lab 08/28/23 0301 08/29/23 0143 08/30/23 0254 08/31/23 0333 09/01/23 0419  WBC 9.7 8.6 9.1 8.8 9.6  NEUTROABS 7.9* 6.9 7.2 6.4 7.4  HGB 12.6* 13.4 13.5 13.5 13.3  HCT 36.9* 38.6* 39.3 39.8 39.3  MCV 93.4 92.1 91.8 92.8 92.3  PLT 116* 151 155 179 197    Basic Metabolic Panel: Recent Labs  Lab 08/28/23 0301 08/29/23 0359 08/30/23 0737 08/31/23 0333 09/01/23 0419  NA 133* 135 135 135 134*  K 3.3* 3.3* 3.2* 3.9 3.6  CL 96* 96* 93* 95* 94*  CO2 26 28 30 31  32  GLUCOSE 120* 175* 187* 252* 280*  BUN 12 15 14 15 17   CREATININE 0.57* 0.66 0.61 0.64 0.63  CALCIUM 8.6* 8.5* 8.7* 8.8* 8.9  MG  --   --   --  1.8 1.6*  PHOS  --   --   --  3.1 2.7    GFR: Estimated Creatinine Clearance: 123.5 mL/min (by C-G formula based on SCr of 0.63 mg/dL).  Liver Function Tests: Recent Labs  Lab 08/30/23 0737 08/31/23 0333 09/01/23 0419  AST 46* 34 35  ALT 101* 83* 71*  ALKPHOS 78 79 80  BILITOT 1.2 0.9 1.2  PROT 6.4*  6.4* 6.7  ALBUMIN 2.5* 2.5* 2.5*    CBG: Recent Labs  Lab 08/31/23 2132 09/01/23 0614 09/01/23 1037 09/01/23 1611 09/01/23 1636  GLUCAP 249* 262* 274* 262* 264*     Recent Results (from the past 240 hours)  Resp panel by RT-PCR (RSV, Flu Jamice Carreno&B, Covid) Anterior Nasal Swab     Status: None   Collection Time: 08/24/23  8:10 PM   Specimen: Anterior Nasal Swab  Result Value Ref Range Status   SARS Coronavirus 2 by RT PCR NEGATIVE NEGATIVE Final   Influenza Cara Thaxton by PCR NEGATIVE NEGATIVE Final   Influenza B by PCR NEGATIVE NEGATIVE Final    Comment: (NOTE) The Xpert Xpress SARS-CoV-2/FLU/RSV plus assay is intended as an aid in the diagnosis of influenza from Nasopharyngeal swab specimens and should  not be used as Pammie Chirino sole basis for treatment. Nasal washings and aspirates are unacceptable for Xpert Xpress SARS-CoV-2/FLU/RSV testing.  Fact Sheet for Patients: BloggerCourse.com  Fact Sheet for Healthcare Providers: SeriousBroker.it  This test is not yet approved or cleared by the [place]  FDA and has been authorized for detection and/or diagnosis of SARS-CoV-2 by FDA under an Emergency Use Authorization (EUA). This EUA will remain in effect (meaning this test can be used) for the duration of the COVID-19 declaration under Section 564(b)(1) of the Act, 21 U.S.C. section 360bbb-3(b)(1), unless the authorization is terminated or revoked.     Resp Syncytial Virus by PCR NEGATIVE NEGATIVE Final    Comment: (NOTE) Fact Sheet for Patients: BloggerCourse.com  Fact Sheet for Healthcare Providers: SeriousBroker.it  This test is not yet approved or cleared by the [place]  FDA and has been authorized for detection and/or diagnosis of SARS-CoV-2 by FDA under an Emergency Use Authorization (EUA). This EUA will remain in effect (meaning this test can be used) for the duration of  the COVID-19 declaration under Section 564(b)(1) of the Act, 21 U.S.C. section 360bbb-3(b)(1), unless the authorization is terminated or revoked.  Performed at [facility], [address] [place], [place] [address]   MRSA Next Gen by PCR, Nasal     Status: Abnormal   Collection Time: 08/25/23  2:40 PM   Specimen: Nasal Mucosa; Nasal Swab  Result Value Ref Range Status   MRSA by PCR Next Gen DETECTED ([place]) NOT DETECTED Final    Comment: RESULT CALLED TO, READ BACK BY AND VERIFIED WITH: RN Amedeo Bailiff on [phone] @1715  by SM (NOTE) The GeneXpert MRSA Assay (FDA approved for NASAL specimens only), is one component of Yvon Mccord comprehensive MRSA colonization surveillance program. It is not intended to diagnose MRSA infection nor to guide or monitor treatment for MRSA infections. Test performance is not FDA approved in patients less than 4 years old. Performed at [facility], [address], [place], [place] [address]          Radiology Studies: ECHOCARDIOGRAM COMPLETE Result Date: 08/31/2023    ECHOCARDIOGRAM REPORT   Patient Name:   JALEEN CRAIGIE [facility] Date of Exam: 08/31/2023 Medical Rec #:  [id number]          Height:       73.0 in Accession #:    [id number]         Weight:       229.3 lb Date of Birth:  04/28/1960          BSA:          2.280 m Patient Age:    62 years           BP:           131/72 mmHg Patient Gender: M                  HR:           94 bpm. Exam Location:  Inpatient Procedure: 2D Echo, Cardiac Doppler and Color Doppler (Both Spectral and Color            Flow Doppler were utilized during procedure). Indications:    other abnormalities of the heart R00.8  History:        Patient has prior history of Echocardiogram examinations, most                 recent 09/07/2022. Risk Factors:Hypertension, Diabetes and  Dyslipidemia.  Sonographer:    Terrilee Few RCS Referring Phys: [phone] Jakylan Ron CALDWELL POWELL JR IMPRESSIONS  1. Left ventricular ejection fraction,  by estimation, is 60 to 65%. The left ventricle has normal function. The left ventricle has no regional wall motion abnormalities. There is mild left ventricular hypertrophy of the basal-septal segment. Left ventricular diastolic parameters were normal.  2. Right ventricular systolic function is normal. The right ventricular size is normal. Tricuspid regurgitation signal is inadequate for assessing PA pressure.  3. The mitral valve is normal in structure. No evidence of mitral valve regurgitation. No evidence of mitral stenosis.  4. The aortic valve is tricuspid. Aortic valve regurgitation is not visualized. Aortic valve sclerosis/calcification is present, without any evidence of aortic stenosis. Aortic valve Vmax measures 1.48 m/s. FINDINGS  Left Ventricle: Left ventricular ejection fraction, by estimation, is 60 to 65%. The left ventricle has normal function. The left ventricle has no regional wall motion abnormalities. The left ventricular internal cavity size was normal in size. There is  mild left ventricular hypertrophy of the basal-septal segment. Left ventricular diastolic parameters were normal. Normal left ventricular filling pressure. Right Ventricle: The right ventricular size is normal. No increase in right ventricular wall thickness. Right ventricular systolic function is normal. Tricuspid regurgitation signal is inadequate for assessing PA pressure. Left Atrium: Left atrial size was normal in size. Right Atrium: Right atrial size was normal in size. Pericardium: There is no evidence of pericardial effusion. Mitral Valve: The mitral valve is normal in structure. No evidence of mitral valve regurgitation. No evidence of mitral valve stenosis. Tricuspid Valve: The tricuspid valve is normal in structure. Tricuspid valve regurgitation is not demonstrated. No evidence of tricuspid stenosis. Aortic Valve: The aortic valve is tricuspid. Aortic valve regurgitation is not visualized. Aortic valve  sclerosis/calcification is present, without any evidence of aortic stenosis. Aortic valve peak gradient measures 8.8 mmHg. Pulmonic Valve: The pulmonic valve was normal in structure. Pulmonic valve regurgitation is not visualized. No evidence of pulmonic stenosis. Aorta: The aortic root is normal in size and structure. Venous: The inferior vena cava was not well visualized. IAS/Shunts: No atrial level shunt detected by color flow Doppler.  LEFT VENTRICLE PLAX 2D LVIDd:         4.00 cm   Diastology LVIDs:         2.70 cm   LV e' medial:    8.16 cm/s LV PW:         1.10 cm   LV E/e' medial:  8.0 LV IVS:        1.20 cm   LV e' lateral:   11.10 cm/s LVOT diam:     2.40 cm   LV E/e' lateral: 5.9 LV SV:         65 LV SV Index:   28 LVOT Area:     4.52 cm  RIGHT VENTRICLE RV S prime:     14.80 cm/s TAPSE (M-mode): 2.5 cm LEFT ATRIUM             Index        RIGHT ATRIUM           Index LA diam:        3.70 cm 1.62 cm/m   RA Area:     13.20 cm LA Vol (A2C):   57.4 ml 25.17 ml/m  RA Volume:   25.95 ml  11.38 ml/m LA Vol (A4C):   69.4 ml 30.44 ml/m LA Biplane Vol: 67.9 ml 29.78 ml/m  AORTIC VALVE AV Area (Vmax): 2.44 cm AV Vmax:        148.00 cm/s AV Peak Grad:   8.8 mmHg LVOT Vmax:      79.70 cm/s LVOT Vmean:     53.500 cm/s LVOT VTI:       0.143 m  AORTA Ao Root diam: 3.50 cm Ao Asc diam:  3.30 cm MITRAL VALVE MV Area (PHT): 4.86 cm    SHUNTS MV Decel Time: 156 msec    Systemic VTI:  0.14 m MV E velocity: 65.50 cm/s  Systemic Diam: 2.40 cm MV [place] velocity: 81.80 cm/s MV E/Tamantha Saline ratio:  0.80 Gaylyn Keas MD Electronically signed by Gaylyn Keas MD Signature Date/Time: 08/31/2023/5:18:13 PM    Final    CT CHEST W CONTRAST Result Date: 08/31/2023 CLINICAL DATA:  Multifocal pneumonia EXAM: CT CHEST WITH CONTRAST TECHNIQUE: Multidetector CT imaging of the chest was performed during intravenous contrast administration. RADIATION DOSE REDUCTION: This exam was performed according to the departmental dose-optimization program  which includes automated exposure control, adjustment of the mA and/or kV according to patient size and/or use of iterative reconstruction technique. CONTRAST:  50mL OMNIPAQUE  IOHEXOL  350 MG/ML SOLN COMPARISON:  Chest radiograph dated 08/29/2023, CT chest dated 09/10/2022 FINDINGS: Decreased sensitivity and specificity for detailed findings due to motion artifact. Cardiovascular: Normal heart size. No significant pericardial fluid/thickening. Great vessels are normal in course and caliber. No central pulmonary emboli. Coronary artery calcifications and aortic atherosclerosis. Mediastinum/Nodes: Imaged thyroid gland without nodules meeting criteria for imaging follow-up by size. Mild circumferential mural thickening of the esophagus. 12 mm right hilar lymph node (3:55) and 11 mm left hilar lymph node (3:56). Lungs/Pleura: The central airways are patent. Moderate diffuse bronchial wall thickening. Diffuse, multifocal ground-glass opacities and irregular consolidations. No pneumothorax. Trace bilateral pleural effusions. Upper abdomen: Mildly enlarged spleen measures 13.9 cm. Musculoskeletal: No acute or abnormal lytic or blastic osseous lesions. IMPRESSION: 1. Diffuse, multifocal ground-glass opacities and irregular consolidations, likely multifocal pneumonia. 2. Trace bilateral pleural effusions. 3. Mild circumferential mural thickening of the esophagus, which can be seen in the setting of esophagitis. 4. Mildly enlarged bilateral hilar lymph nodes, likely reactive. 5. Aortic Atherosclerosis (ICD10-I70.0). Coronary artery calcifications. Assessment for potential risk factor modification, dietary therapy or pharmacologic therapy may be warranted, if clinically indicated. Electronically Signed   By: Limin  Xu M.D.   On: 08/31/2023 12:42   CT SOFT TISSUE NECK WO CONTRAST Result Date: 08/30/2023 CLINICAL DATA:  Dysphagia EXAM: CT NECK WITHOUT CONTRAST TECHNIQUE: Multidetector CT imaging of the neck was performed  following the standard protocol without intravenous contrast. RADIATION DOSE REDUCTION: This exam was performed according to the departmental dose-optimization program which includes automated exposure control, adjustment of the mA and/or kV according to patient size and/or use of iterative reconstruction technique. COMPARISON:  None Available. FINDINGS: Pharynx and larynx: Prominent disc osteophytes at C2-3 result in mild impression upon the posterior hypopharynx without significant luminal compromise. No mass lesion or soft tissue swelling identified. Salivary glands: Thinning of the overlying subcutaneous fat and subdermal infiltration superficial to the left parotid gland in keeping with remote trauma or postsurgical change. The parotid and submandibular salivary glands are unremarkable. Thyroid: Normal. Lymph nodes: None enlarged or abnormal density. Vascular: Mild atherosclerotic calcification within the left carotid bifurcation. Limited intracranial: Negative. Visualized orbits: Right scleral banding. Right ocular lens has been removed. No acute abnormality. Mastoids and visualized paranasal sinuses: Clear. Skeleton: Segmentation anomaly C2-3 with incomplete segmentation. Intervertebral disc space narrowing and endplate remodeling  is seen throughout the remainder of the cervical spine in keeping with changes of moderate degenerative disc disease. As noted above, prominent anteriorly oriented disc osteophytes at C2-3 result in mild depression upon the posterior hypopharynx. 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 Milder changes related to posterior disc osteophyte complex ease are seen at C5-6 and C6-7. Uncovertebral arthrosis results in severe bilateral neuroforaminal narrowing at C5-6 and more moderate  bilateral neuroforaminal narrowing at C6-7. No acute abnormality. Upper chest: Biapical airspace infiltrate, asymmetrically more severe within the right apex is better seen on prior CT examination of the chest and is compatible with multifocal pneumonic infiltrate. Other: None IMPRESSION: 1. Prominent anteriorly oriented disc osteophytes at C2-3 result in mild impression upon the posterior hypopharynx without significant luminal compromise. 2. 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 3. Severe bilateral neuroforaminal narrowing at C5-6 and more moderate bilateral neuroforaminal narrowing at C6-7. 4. Biapical airspace infiltrate, asymmetrically more severe within the right apex is better seen on prior CT examination of the chest and is compatible with multifocal pneumonic infiltrate. 5. Thinning of the overlying subcutaneous fat and subdermal infiltration superficial to the left parotid gland in keeping with remote trauma or postsurgical change. Electronically Signed   By: Worthy Heads M.D.   On: 08/30/2023 23:45        Scheduled Meds:  albuterol   2.5 mg Nebulization BID   ARIPiprazole   2 mg Oral Daily   furosemide   40 mg Intravenous Q12H   insulin  aspart  0-5 Units Subcutaneous QHS   insulin  aspart  0-9 Units Subcutaneous TID WC   insulin  aspart  4 Units Subcutaneous TID WC   insulin  glargine-yfgn  30 Units Subcutaneous Daily   metoprolol  tartrate  25 mg Oral BID   pantoprazole   40 mg Oral Daily   polyethylene glycol  17 g Oral BID   rivaroxaban   20 mg Oral Q supper   senna  1 tablet Oral QHS   Continuous Infusions:  vancomycin  Stopped (09/01/23 0739)     LOS: 8 days    Time spent: over 30 min     Donnetta Gains, MD [facility]   To contact the attending provider between 7A-7P or the covering  provider during after hours 7P-7A, please log into the web site [email or web address] and access using universal [place] password for that web site. If you do not have the password, please call the hospital operator.  09/01/2023, 4:41 PM

## 2023-09-01 NOTE — Plan of Care (Signed)
  Problem: Education: Goal: Knowledge of General Education information will improve Description: Including pain rating scale, medication(s)/side effects and non-pharmacologic comfort measures Outcome: Progressing   Problem: Clinical Measurements: Goal: Ability to maintain clinical measurements within normal limits will improve Outcome: Progressing   Problem: Elimination: Goal: Will not experience complications related to bowel motility Outcome: Progressing   Problem: Pain Managment: Goal: General experience of comfort will improve and/or be controlled Outcome: Progressing   Problem: Safety: Goal: Ability to remain free from injury will improve Outcome: Progressing   Problem: Skin Integrity: Goal: Risk for impaired skin integrity will decrease Outcome: Progressing   Problem: Tissue Perfusion: Goal: Adequacy of tissue perfusion will improve Outcome: Progressing   Problem: Skin Integrity: Goal: Risk for impaired skin integrity will decrease Outcome: Progressing

## 2023-09-01 NOTE — Progress Notes (Signed)
 PROGRESS NOTE    Bradley Mcclure  ZOX:[id number] DOB: July 29, 1960 DOA: 08/24/2023 PCP: [facility]  Chief Complaint  Patient presents with   Choking    Brief Narrative:   63 yo  with hx PE, T2DM, hx MRSA bacteremia, hx dysphagia and cricopharyngeal narrowing and multiple other medical issues who presented after an aspiration event while eating Bradley Mcclure sandwich at work.   Assessment & Plan:   Principal Problem:   Aspiration into airway Active Problems:   Uncontrolled type 2 diabetes mellitus with hyperglycemia, with long-term current use of insulin  (HCC)   SIRS (systemic inflammatory response syndrome) (HCC)   Oropharyngeal dysphagia   Acute hypoxic respiratory failure (HCC)   Aspiration pneumonitis (HCC)  Aspiration Pneumonia  Multifocal Pneumonia Acute Hypoxic Respiratory Failure CXR 5/8 with mild patchy and ill defined nodular densities in the mid right lung and left mid and lower lung (needs repeat cxr in 4-6 weeks to ensure resolution) Last fever 5/13 CXR 5/13 with improving L lung pneumonia and persistent R upper lobe pneumonia Continued significant O2 needs, on 9-10 L HFNC -> wean as tolerated  CT chest 5/15 with multifocal pneumonia  S/p 7 days coverage for aspiration pneumonia with unasyn/augmentin , will add vanc with lack of improvement.  Notably positive MRSA PCR. Urine strep - negative, urine legionella pending.  Sputum cx if able to collect.   Dysphagia SLP eval, appreciate assistance - presumed osteophytes projecting into the pharynx and preventing epiglottic inversion with liquids  CT with prominent anteriorly oriented disc osteophytes at C2-3 result in mild impression upon posterior hypopharynx Per discussion with SLP, osteophytes prevent epiglottic inversion with liquids - risk for aspiration Not clear if anything to do, will discuss with nsgy (no surgical indication/recommendation) continue current diet with use of chin tuck to prevent penetration.    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 Severe bilateral neuroforaminal narrowing at C5-6 and more moderate bilateral neuroforaminal narrowing at C6-7.   Outpatient nsgy follow up   T2DM At home on metformin , semaglutide Adjust basal/bolus regimen prn  Sinus Tach Follow on his chronic metop   Hx PE  Xarelto    Depression Abilify , prozac  Cognitive Impairment Unclear baseline    DVT prophylaxis: xarelto  Code Status: full Family Communication: none Disposition:   Status is: Inpatient Remains inpatient appropriate because: need for ongoing inpatient care with hypoxia   Consultants:  none  Procedures:  none  Antimicrobials:  Anti-infectives (From admission, onward)    Start     Dose/Rate Route Frequency Ordered Stop   08/31/23 0700  vancomycin  (VANCOREADY) IVPB 750 mg/150 mL        750 mg 150 mL/hr over 60 Minutes Intravenous Every 12 hours 08/30/23 1837     08/30/23 1930  vancomycin  (VANCOREADY) IVPB 2000 mg/400 mL        2,000 mg 200 mL/hr over 120 Minutes Intravenous  Once 08/30/23 1836 08/30/23 2202   08/29/23 1000  amoxicillin -clavulanate (AUGMENTIN ) 875-125 MG per tablet 1 tablet        1 tablet Oral Every 12 hours 08/29/23 0836 08/31/23 2118   08/25/23 1000  Ampicillin-Sulbactam (UNASYN) 3 g in sodium chloride  0.9 % 100 mL IVPB  Status:  Discontinued        3 g 200 mL/hr over 30 Minutes Intravenous Every 6 hours 08/25/23 0859 08/29/23 0836       Subjective: No complaints today  Objective: Vitals:   09/01/23 0030 09/01/23 0031 09/01/23 0542 09/01/23 0740   BP:  120/71 113/65 (!) 119/58  Pulse:  91 86 85  Resp: 20 20 20 18   Temp:  98 F (36.7 C) 98.7 F (37.1 C) 98.4 F (36.9 C)  TempSrc:  Oral Oral Oral  SpO2:  96% 93%   Weight:   102.5 kg   Height:        Intake/Output Summary (Last 24 hours) at 09/01/2023 1004 Last data filed at 09/01/2023 0646 Gross per 24 hour  Intake 549.14 ml  Output 3450 ml  Net -2900.86 ml   Filed Weights   08/28/23 0400 08/29/23 0345 09/01/23 0542  Weight: 106 kg 104 kg 102.5 kg    Examination:  General: No acute distress. Cardiovascular: RRR Lungs: diminished, unlabored Abdomen: Soft, nontender, nondistended Neurological: Alert and oriented 3. Moves all extremities 4 with equal strength. Cranial nerves II through XII grossly intact.. Extremities: No clubbing or cyanosis. No edema.    Data Reviewed: I have personally reviewed following labs and imaging studies  CBC: Recent Labs  Lab 08/28/23 0301 08/29/23 0143 08/30/23 0254 08/31/23 0333 09/01/23 0419  WBC 9.7 8.6 9.1 8.8 9.6  NEUTROABS 7.9* 6.9 7.2 6.4 7.4  HGB 12.6* 13.4 13.5 13.5 13.3  HCT 36.9* 38.6* 39.3 39.8 39.3  MCV 93.4 92.1 91.8 92.8 92.3  PLT 116* 151 155 179 197    Basic Metabolic Panel: Recent Labs  Lab 08/28/23 0301 08/29/23 0359 08/30/23 0737 08/31/23 0333 09/01/23 0419  NA 133* 135 135 135 134*  K 3.3* 3.3* 3.2* 3.9 3.6  CL 96* 96* 93* 95* 94*  CO2 26 28 30 31  32  GLUCOSE 120* 175* 187* 252* 280*  BUN 12 15 14 15 17   CREATININE 0.57* 0.66 0.61 0.64 0.63  CALCIUM 8.6* 8.5* 8.7* 8.8* 8.9  MG  --   --   --  1.8 1.6*  PHOS  --   --   --  3.1 2.7    GFR: Estimated Creatinine Clearance: 123.5 mL/min (by C-G formula based on SCr of 0.63 mg/dL).  Liver Function Tests: Recent Labs  Lab 08/30/23 0737 08/31/23 0333 09/01/23 0419  AST 46* 34 35  ALT 101* 83* 71*  ALKPHOS 78 79 80  BILITOT 1.2 0.9 1.2  PROT 6.4* 6.4* 6.7  ALBUMIN 2.5* 2.5* 2.5*    CBG: Recent Labs  Lab 08/31/23 0622  08/31/23 1102 08/31/23 1545 08/31/23 2132 09/01/23 0614  GLUCAP 266* 327* 293* 249* 262*     Recent Results (from the past 240 hours)  Resp panel by RT-PCR (RSV, Flu Bradley Mcclure&B, Covid) Anterior Nasal Swab     Status: None   Collection Time: 08/24/23  8:10 PM   Specimen: Anterior Nasal Swab  Result Value Ref Range Status   SARS Coronavirus 2 by RT PCR NEGATIVE NEGATIVE Final   Influenza Monish [place] by PCR NEGATIVE NEGATIVE Final   Influenza B by PCR NEGATIVE NEGATIVE Final    Comment: (NOTE) The Xpert Xpress SARS-CoV-2/FLU/RSV plus assay is intended as an aid in the diagnosis of influenza from Nasopharyngeal swab specimens and should not be used as Ewa Hipp sole basis for treatment. Nasal washings and aspirates are unacceptable  for Xpert Xpress SARS-CoV-2/FLU/RSV testing.  Fact Sheet for Patients: BloggerCourse.com  Fact Sheet for Healthcare Providers: SeriousBroker.it  This test is not yet approved or cleared by the [place]  FDA and has been authorized for detection and/or diagnosis of SARS-CoV-2 by FDA under an Emergency Use Authorization (EUA). This EUA will remain in effect (meaning this test can be used) for the duration of the COVID-19 declaration under Section 564(b)(1) of the Act, 21 U.S.C. section 360bbb-3(b)(1), unless the authorization is terminated or revoked.     Resp Syncytial Virus by PCR NEGATIVE NEGATIVE Final    Comment: (NOTE) Fact Sheet for Patients: BloggerCourse.com  Fact Sheet for Healthcare Providers: SeriousBroker.it  This test is not yet approved or cleared by the [place]  FDA and has been authorized for detection and/or diagnosis of SARS-CoV-2 by FDA under an Emergency Use Authorization (EUA). This EUA will remain in effect (meaning this test can be used) for the duration of the COVID-19 declaration under Section 564(b)(1) of the Act, 21 U.S.C. section  360bbb-3(b)(1), unless the authorization is terminated or revoked.  Performed at [facility], [address] [place], [place] [address]   MRSA Next Gen by PCR, Nasal     Status: Abnormal   Collection Time: 08/25/23  2:40 PM   Specimen: Nasal Mucosa; Nasal Swab  Result Value Ref Range Status   MRSA by PCR Next Gen DETECTED (Xyon Lukasik) NOT DETECTED Final    Comment: RESULT CALLED TO, READ BACK BY AND VERIFIED WITH: RN Clinton W on [phone] @1715  by SM (NOTE) The GeneXpert MRSA Assay (FDA approved for NASAL specimens only), is one component of Mclane Arora comprehensive MRSA colonization surveillance program. It is not intended to diagnose MRSA infection nor to guide or monitor treatment for MRSA infections. Test performance is not FDA approved in patients less than 44 years old. Performed at [facility], [address] [place], [place] [address]          Radiology Studies: ECHOCARDIOGRAM COMPLETE Result Date: 08/31/2023    ECHOCARDIOGRAM REPORT   Patient Name:   Bradley Mcclure [facility] Date of Exam: 08/31/2023 Medical Rec #:  [id number]          Height:       73.0 in Accession #:    [id number]         Weight:       229.3 lb Date of Birth:  [birth year]          BSA:          2.280 m Patient Age:    62 years           BP:           131/72 mmHg Patient Gender: M                  HR:           94 bpm. Exam Location:  Inpatient Procedure: 2D Echo, Cardiac Doppler and Color Doppler (Both Spectral and Color            Flow Doppler were utilized during procedure). Indications:    other abnormalities of the heart R00.8  History:        Patient has prior history of Echocardiogram examinations, most                 recent 09/07/2022. Risk Factors:Hypertension, Diabetes and                 Dyslipidemia.  Sonographer:  Terrilee Few RCS Referring Phys: [phone] Marquesha Robideau CALDWELL POWELL JR IMPRESSIONS  1. Left ventricular ejection fraction, by estimation, is 60 to 65%. The left ventricle has normal function. The left  ventricle has no regional wall motion abnormalities. There is mild left ventricular hypertrophy of the basal-septal segment. Left ventricular diastolic parameters were normal.  2. Right ventricular systolic function is normal. The right ventricular size is normal. Tricuspid regurgitation signal is inadequate for assessing PA pressure.  3. The mitral valve is normal in structure. No evidence of mitral valve regurgitation. No evidence of mitral stenosis.  4. The aortic valve is tricuspid. Aortic valve regurgitation is not visualized. Aortic valve sclerosis/calcification is present, without any evidence of aortic stenosis. Aortic valve Vmax measures 1.48 m/s. FINDINGS  Left Ventricle: Left ventricular ejection fraction, by estimation, is 60 to 65%. The left ventricle has normal function. The left ventricle has no regional wall motion abnormalities. The left ventricular internal cavity size was normal in size. There is  mild left ventricular hypertrophy of the basal-septal segment. Left ventricular diastolic parameters were normal. Normal left ventricular filling pressure. Right Ventricle: The right ventricular size is normal. No increase in right ventricular wall thickness. Right ventricular systolic function is normal. Tricuspid regurgitation signal is inadequate for assessing PA pressure. Left Atrium: Left atrial size was normal in size. Right Atrium: Right atrial size was normal in size. Pericardium: There is no evidence of pericardial effusion. Mitral Valve: The mitral valve is normal in structure. No evidence of mitral valve regurgitation. No evidence of mitral valve stenosis. Tricuspid Valve: The tricuspid valve is normal in structure. Tricuspid valve regurgitation is not demonstrated. No evidence of tricuspid stenosis. Aortic Valve: The aortic valve is tricuspid. Aortic valve regurgitation is not visualized. Aortic valve sclerosis/calcification is present, without any evidence of aortic stenosis. Aortic valve  peak gradient measures 8.8 mmHg. Pulmonic Valve: The pulmonic valve was normal in structure. Pulmonic valve regurgitation is not visualized. No evidence of pulmonic stenosis. Aorta: The aortic root is normal in size and structure. Venous: The inferior vena cava was not well visualized. IAS/Shunts: No atrial level shunt detected by color flow Doppler.  LEFT VENTRICLE PLAX 2D LVIDd:         4.00 cm   Diastology LVIDs:         2.70 cm   LV e' medial:    8.16 cm/s LV PW:         1.10 cm   LV E/e' medial:  8.0 LV IVS:        1.20 cm   LV e' lateral:   11.10 cm/s LVOT diam:     2.40 cm   LV E/e' lateral: 5.9 LV SV:         65 LV SV Index:   28 LVOT Area:     4.52 cm  RIGHT VENTRICLE RV S prime:     14.80 cm/s TAPSE (M-mode): 2.5 cm LEFT ATRIUM             Index        RIGHT ATRIUM           Index LA diam:        3.70 cm 1.62 cm/m   RA Area:     13.20 cm LA Vol (A2C):   57.4 ml 25.17 ml/m  RA Volume:   25.95 ml  11.38 ml/m LA Vol (A4C):   69.4 ml 30.44 ml/m LA Biplane Vol: 67.9 ml 29.78 ml/m  AORTIC VALVE AV Area (Vmax):  2.44 cm AV Vmax:        148.00 cm/s AV Peak Grad:   8.8 mmHg LVOT Vmax:      79.70 cm/s LVOT Vmean:     53.500 cm/s LVOT VTI:       0.143 m  AORTA Ao Root diam: 3.50 cm Ao Asc diam:  3.30 cm MITRAL VALVE MV Area (PHT): 4.86 cm    SHUNTS MV Decel Time: 156 msec    Systemic VTI:  0.14 m MV E velocity: 65.50 cm/s  Systemic Diam: 2.40 cm MV Ninoshka Wainwright velocity: 81.80 cm/s MV E/Gus Littler ratio:  0.80 Gaylyn Keas MD Electronically signed by Gaylyn Keas MD Signature Date/Time: 08/31/2023/5:18:13 PM    Final    CT CHEST W CONTRAST Result Date: 08/31/2023 CLINICAL DATA:  Multifocal pneumonia EXAM: CT CHEST WITH CONTRAST TECHNIQUE: Multidetector CT imaging of the chest was performed during intravenous contrast administration. RADIATION DOSE REDUCTION: This exam was performed according to the departmental dose-optimization program which includes automated exposure control, adjustment of the mA and/or kV according to  patient size and/or use of iterative reconstruction technique. CONTRAST:  50mL OMNIPAQUE  IOHEXOL  350 MG/ML SOLN COMPARISON:  Chest radiograph dated 08/29/2023, CT chest dated 09/10/2022 FINDINGS: Decreased sensitivity and specificity for detailed findings due to motion artifact. Cardiovascular: Normal heart size. No significant pericardial fluid/thickening. Great vessels are normal in course and caliber. No central pulmonary emboli. Coronary artery calcifications and aortic atherosclerosis. Mediastinum/Nodes: Imaged thyroid gland without nodules meeting criteria for imaging follow-up by size. Mild circumferential mural thickening of the esophagus. 12 mm right hilar lymph node (3:55) and 11 mm left hilar lymph node (3:56). Lungs/Pleura: The central airways are patent. Moderate diffuse bronchial wall thickening. Diffuse, multifocal ground-glass opacities and irregular consolidations. No pneumothorax. Trace bilateral pleural effusions. Upper abdomen: Mildly enlarged spleen measures 13.9 cm. Musculoskeletal: No acute or abnormal lytic or blastic osseous lesions. IMPRESSION: 1. Diffuse, multifocal ground-glass opacities and irregular consolidations, likely multifocal pneumonia. 2. Trace bilateral pleural effusions. 3. Mild circumferential mural thickening of the esophagus, which can be seen in the setting of esophagitis. 4. Mildly enlarged bilateral hilar lymph nodes, likely reactive. 5. Aortic Atherosclerosis (ICD10-I70.0). Coronary artery calcifications. Assessment for potential risk factor modification, dietary therapy or pharmacologic therapy may be warranted, if clinically indicated. Electronically Signed   By: Limin  Xu M.D.   On: 08/31/2023 12:42   CT SOFT TISSUE NECK WO CONTRAST Result Date: 08/30/2023 CLINICAL DATA:  Dysphagia EXAM: CT NECK WITHOUT CONTRAST TECHNIQUE: Multidetector CT imaging of the neck was performed following the standard protocol without intravenous contrast. RADIATION DOSE REDUCTION: This  exam was performed according to the departmental dose-optimization program which includes automated exposure control, adjustment of the mA and/or kV according to patient size and/or use of iterative reconstruction technique. COMPARISON:  None Available. FINDINGS: Pharynx and larynx: Prominent disc osteophytes at C2-3 result in mild impression upon the posterior hypopharynx without significant luminal compromise. No mass lesion or soft tissue swelling identified. Salivary glands: Thinning of the overlying subcutaneous fat and subdermal infiltration superficial to the left parotid gland in keeping with remote trauma or postsurgical change. The parotid and submandibular salivary glands are unremarkable. Thyroid: Normal. Lymph nodes: None enlarged or abnormal density. Vascular: Mild atherosclerotic calcification within the left carotid bifurcation. Limited intracranial: Negative. Visualized orbits: Right scleral banding. Right ocular lens has been removed. No acute abnormality. Mastoids and visualized paranasal sinuses: Clear. Skeleton: Segmentation anomaly C2-3 with incomplete segmentation. Intervertebral disc space narrowing and endplate remodeling is seen throughout the remainder  of the cervical spine in keeping with changes of moderate degenerative disc disease. As noted above, prominent anteriorly oriented disc osteophytes at C2-3 result in mild depression upon the posterior hypopharynx. 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 Milder changes related to posterior disc osteophyte complex ease are seen at C5-6 and C6-7. Uncovertebral arthrosis results in severe bilateral neuroforaminal narrowing at C5-6 and more moderate bilateral neuroforaminal narrowing at C6-7. No acute abnormality. Upper chest: Biapical  airspace infiltrate, asymmetrically more severe within the right apex is better seen on prior CT examination of the chest and is compatible with multifocal pneumonic infiltrate. Other: None IMPRESSION: 1. Prominent anteriorly oriented disc osteophytes at C2-3 result in mild impression upon the posterior hypopharynx without significant luminal compromise. 2. 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 3. Severe bilateral neuroforaminal narrowing at C5-6 and more moderate bilateral neuroforaminal narrowing at C6-7. 4. Biapical airspace infiltrate, asymmetrically more severe within the right apex is better seen on prior CT examination of the chest and is compatible with multifocal pneumonic infiltrate. 5. Thinning of the overlying subcutaneous fat and subdermal infiltration superficial to the left parotid gland in keeping with remote trauma or postsurgical change. Electronically Signed   By: Worthy Heads M.D.   On: 08/30/2023 23:45   DG Swallowing Func-Speech Pathology Result Date: 08/30/2023 Table formatting from the original result was not included. Images from the original result were not included. Modified Barium Swallow Study Patient Details Name: Bradley Mcclure MRN: [id number] Date of Birth: 13-Mar-1961 Today's Date: 08/30/2023 HPI/PMH: HPI: Bradley Mcclure is Maryjane Benedict 63 y.o. male veteran, with hx of esophageal dysphagia (? Prior cricopharyngeal narrowing), PE on anticoagulation, diabetes type 2, hypertension, hyperlipidemia, NAFLD, depression, cognitive impairment, GERD, who presented after an episode of aspiration.  Reports that around 4 PM he was eating Cesare Sumlin roast beef sandwich and felt that it was moving slowly through his oropharynx, points at his upper neck.  He started "choking" and coughing.  Drank Claudeen Leason soda which felt this helped clear  the food and he started breathing better.  He denies any issues with dysphagia prior to today.  Otherwise reports wheezing for the past 3 days although no history of asthma or COPD, smoking.  Denies any recent fever, chills, cough, rhinorrhea, sore throat, sick contacts.  No chest pain or shortness of breath.  Denies any prior treatment for dysphagia. [facility] history of [facility] dysphagia with?  Cricopharyngeal narrowing and 2017. No prior endoscopy to his knowledge. Clinical Impression: Pt presents with Janiyla Long mild-moderate oropharyngeal dysphagia c/b reduced base of tongue retraction, incomplete laryngeal closure, absent epiglottic inversion with liquid and diminished sensation.  These deficits resulted in deep, silent penetration of thin and nectar thick liquids.  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  Pt was able to acheive full epiglottic inversion with heavier bolus trials (puree, solid).  Pt's cued cough is fairly weak.  Cued cough and secondary swallow were partially beneficial for clearance of penetration.  Pt's cough is weak sounding and lacking crispness.  Chin tuck prevented  penetration of thin liquid.  It is difficult to determine acute v chronic nature of pt's current presentation.  Presumed osteophytes are not acute, but it appears pt has been able to compensate for their presence prior to this admission.  And this may explain cricopharyngeal narrowing [facility] as in addition to pharyngeal projections at C2-3 there are further presumed osteophytes at the level of the UES.  Bolus was able to pass through and on esophageal sweep there was no retention of contrast.  Would consider consult to address structural issues affecting swallow as pt is relatively young and this will continue to place him at higher risk for aspiration. During pill  simulation pill was given whole with puree as compensatory strategies can be difficult to coordinate with tablet and liquid wash.  Initially pt chewed tablet, but on second attempt was able to swallow tablet whole with puree. Recommend continuing regular texture diet with thin liquid, with use of CHIN TUCK. Administer medications whole with puree. DIGEST Swallow Severity Rating*  Safety: 2  Efficiency: 0  Overall Pharyngeal Swallow Severity: 2 1: mild; 2: moderate; 3: severe; 4: profound *The Dynamic Imaging Grade of Swallowing Toxicity is standardized for the head and neck cancer population, however, demonstrates promising clinical applications across populations to standardize the clinical rating of pharyngeal swallow safety and severity. 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 inversion: Factors that may increase risk of adverse event in presence of aspiration Roderick Civatte & Jessy Morocco 2021): Respiratory or GI disease Swallow Evaluation Recommendations Recommendations: PO diet PO Diet Recommendation: Regular;Thin liquids (Level 0) Liquid Administration via: Cup;Straw Medication Administration: Whole meds with puree Supervision: Intermittent supervision/cueing for swallowing strategies Swallowing strategies  : Chin tuck; Slow rate; Small bites/sips Postural changes: Position pt fully upright for meals Recommended consults:  Consider consult to address structural impairments to swallow (i.e. cervical osteophytes) Treatment Plan Treatment Plan Treatment recommendations: Therapy as outlined in treatment plan below Follow-up recommendations: -- (ST at next level of care) Functional status assessment: Patient has had Arena Lindahl recent decline in their functional status and demonstrates the ability to make significant improvements in function in Kaari Zeigler reasonable and predictable amount of time. Treatment frequency: Min 2x/week Treatment duration: 2 weeks Interventions: Respiratory muscle strength training; Compensatory  techniques; Diet toleration management by SLP; Aspiration precaution training Recommendations Recommendations for follow up therapy are one component of Jamy Whyte multi-disciplinary discharge planning process, led by the attending physician.  Recommendations may be updated based on patient status, additional functional criteria and insurance authorization. Assessment: Orofacial Exam: Orofacial Exam Oral Cavity: Oral Hygiene: WFL Oral Cavity - Dentition: Adequate natural dentition Orofacial Anatomy: WFL Oral Motor/Sensory Function: -- (See BSE) Anatomy: Anatomy: Suspected cervical osteophytes Boluses Administered: Boluses Administered Boluses Administered: Thin liquids (Level 0); Mildly thick liquids (Level 2, nectar thick); Moderately thick liquids (Level 3, honey thick); Puree; Solid  Oral Impairment Domain: Oral Impairment Domain Lip Closure: No labial escape Tongue control during bolus hold: Cohesive bolus between tongue to palatal seal Bolus preparation/mastication: Timely and efficient chewing and mashing Bolus transport/lingual motion: Brisk tongue motion Oral residue: Complete oral clearance Location of oral residue : N/Jenelle Drennon Initiation of pharyngeal swallow : Posterior angle of the ramus  Pharyngeal Impairment Domain: Pharyngeal Impairment Domain Soft palate elevation: No bolus between soft palate (SP)/pharyngeal wall (PW) Laryngeal elevation: Complete superior movement of thyroid cartilage with complete approximation of arytenoids to epiglottic petiole Anterior hyoid excursion: Complete anterior movement Epiglottic movement: No inversion (With liquids.  Complete inversion with solids) Pharyngeal  stripping wave : Present - diminished Pharyngeal contraction ([place]/P view only): N/Ophie Burrowes Pharyngoesophageal segment opening: Partial distention/partial duration, partial obstruction of flow Tongue base retraction: Trace column of contrast or air between tongue base and PPW Pharyngeal residue: Collection of residue within or on  pharyngeal structures Location of pharyngeal residue: Valleculae  Esophageal Impairment Domain: Esophageal Impairment Domain Esophageal clearance upright position: Complete clearance, esophageal coating Pill: Pill Consistency administered: Puree Puree: WFL Penetration/Aspiration Scale Score: Penetration/Aspiration Scale Score 1.  Material does not enter airway: Moderately thick liquids (Level 3, honey thick); Puree; Solid; Pill 3.  Material enters airway, remains ABOVE vocal cords and not ejected out: Thin liquids (Level 0); Mildly thick liquids (Level 2, nectar thick) Compensatory Strategies: Compensatory Strategies Compensatory strategies: Yes Chin tuck: Effective Effective Chin Tuck: Thin liquid (Level 0) Other(comment): -- (Cued cough and re-swallow, partially effective)   General Information: Caregiver present: No  Diet Prior to this Study: Regular; Thin liquids (Level 0)   No data recorded  Respiratory Status: WFL   Supplemental O2: None (Room air) (Despite requiring 10L by HFNC at bedside)   No data recorded Behavior/Cognition: Alert; Cooperative Self-Feeding Abilities: Able to self-feed Baseline vocal quality/speech: Normal Volitional Cough: Able to elicit No data recorded Exam Limitations: No limitations Goal Planning: Prognosis for improved oropharyngeal function: Fair Barriers to Reach Goals: -- (Structural changes affecting swallow function) No data recorded No data recorded Consulted and agree with results and recommendations: Patient; Nurse Pain: Pain Assessment Faces Pain Scale: 0 Breathing: 0 End of Session: Start Time:SLP Start Time (ACUTE ONLY): 0919 Stop Time: SLP Stop Time (ACUTE ONLY): 0926 Time Calculation:SLP Time Calculation (min) (ACUTE ONLY): 7 min Charges: SLP Evaluations $ SLP Speech Visit: 1 Visit SLP Evaluations $Swallowing Treatment: 1 Procedure SLP visit diagnosis: SLP Visit Diagnosis: Dysphagia, oropharyngeal phase (R13.12) Past Medical History: Past Medical History: Diagnosis Date   Balanitis 12/24/2015  Cellulitis and abscess 12/24/2015  Generalized anxiety disorder 09/06/2022  Hyperlipidemia 09/06/2022  Hypertension   IBS (irritable bowel syndrome)   Major depressive disorder 09/06/2022  Mild cognitive disorder 09/06/2022  Dec 07, 2016 Entered By: Rosalita Combe Comment: Medical Disabilty 12.2017 -Jul 10, 2018 Entered By: Aloma Jaksch Comment: diagnosed at [place] [facility]  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 ENDOSCOPY;  Service: Cardiovascular;  Laterality: N/Gerasimos Plotts;  TONSILLECTOMY  ~ 1977 Elester Grim, MA, CCC-SLP Acute Rehabilitation Services Office: [phone] 08/30/2023, 11:24 AM       Scheduled Meds:  albuterol   2.5 mg Nebulization BID   ARIPiprazole   2 mg Oral Daily   furosemide   40 mg Intravenous Q12H   insulin  aspart  0-5 Units Subcutaneous QHS   insulin  aspart  0-9 Units Subcutaneous TID WC   insulin  aspart  4 Units Subcutaneous TID WC   insulin  glargine-yfgn  30 Units Subcutaneous Daily   metoprolol  tartrate  25 mg Oral BID   pantoprazole   40 mg Oral Daily   polyethylene glycol  17 g Oral BID   rivaroxaban   20 mg Oral Q supper   senna  1 tablet Oral QHS   Continuous  Infusions:  vancomycin  150 mL/hr at 09/01/23 0646     LOS: 8 days    Time spent: over 30 min     Donnetta Gains, MD [facility]   To contact the attending provider between 7A-7P or the covering provider during after hours 7P-7A, please log into the web site [email or web address] and  access using universal [place] password for that web site. If you do not have the password, please call the hospital operator.  09/01/2023, 10:04 AM

## 2023-09-01 NOTE — Plan of Care (Signed)
  Problem: Clinical Measurements: Goal: Ability to maintain clinical measurements within normal limits will improve Outcome: Progressing Goal: Diagnostic test results will improve Outcome: Progressing   Problem: Metabolic: Goal: Ability to maintain appropriate glucose levels will improve Outcome: Progressing

## 2023-09-01 NOTE — Progress Notes (Signed)
 Speech Language Pathology Treatment: Dysphagia  Patient Details Name: Bradley Mcclure MRN: [id number] DOB: [birth year] Today's Date: 09/01/2023 Time: 0915-0929 SLP Time Calculation (min) (ACUTE ONLY): 14 min  Assessment / Plan / Recommendation Clinical Impression  Pt needed verbal cues and explanation to recall chin tuck strategy. After model, explanation and again reporting result of MBS pt both able to use chin tuck consistently and teach back rationale. He reports a good understanding at this time. SLP will sign off in acute care.   HPI HPI: Bradley Mcclure is a 63 y.o. male veteran, with hx of esophageal dysphagia (? Prior cricopharyngeal narrowing), PE on anticoagulation, diabetes type 2, hypertension, hyperlipidemia, NAFLD, depression, cognitive impairment, GERD, who presented after an episode of aspiration.  Reports that around 4 PM he was eating a roast beef sandwich and felt that it was moving slowly through his oropharynx, points at his upper neck.  He started "choking" and coughing.  Drank a soda which felt this helped clear the food and he started breathing better.  He denies any issues with dysphagia prior to today.  Otherwise reports wheezing for the past 3 days although no history of asthma or COPD, smoking.  Denies any recent fever, chills, cough, rhinorrhea, sore throat, sick contacts.  No chest pain or shortness of breath.  Denies any prior treatment for dysphagia. [facility] history of esophageal dysphagia with?  Cricopharyngeal narrowing and 2017. No prior endoscopy to his knowledge.      SLP Plan  All goals met      Recommendations for follow up therapy are one component of a multi-disciplinary discharge planning process, led by the attending physician.  Recommendations may be updated based on patient status, additional functional criteria and insurance authorization.    Recommendations  Diet recommendations: Regular;Thin liquid Liquids provided via:  Cup;Straw Medication Administration: Whole meds with puree Supervision: Intermittent supervision to cue for compensatory strategies Compensations: Slow rate;Small sips/bites Postural Changes and/or Swallow Maneuvers: Seated upright 90 degrees                  Oral care BID   None       All goals met     Bradley Mcclure, Hardin Leys  09/01/2023, 9:32 AM

## 2023-09-01 NOTE — Progress Notes (Signed)
 Physical Therapy Treatment Patient Details Name: Bradley Mcclure MRN: [id number] DOB: July 28, 1960 Today's Date: 09/01/2023   History of Present Illness Patient is a 63 y/o male admitted 08/24/23 with aspiration when choking on roast beef sandwich.  Pt needing 4L O2 with SpO2 85% and HR 120's, RR 20's.  PMH positive for h/o esophageal dysphagia, cognitive impairment, GERD, DM2, PE on anticoagulation, NAFLD, depression, HTN and HLD.    PT Comments  Pt up in chair on arrival and agreeable to session. Pt able to don bil shoes with set up this session and come to standing from chair with CGA for safety. Pt with mildly unsteady gait with x2 posterior LOB with pt able to self correct in all instances. Pt with continued guarded gait, keeping arms in front of self and without reciprocal arm swing despite cues. Pt able to maintain SpO2 >905 on 10L throughout mobility. Pt continues to benefit from skilled PT services to progress toward functional mobility goals.      If plan is discharge home, recommend the following: A little help with bathing/dressing/bathroom;A little help with walking and/or transfers;Assist for transportation;Supervision due to cognitive status;Help with stairs or ramp for entrance   Can travel by private vehicle        Equipment Recommendations  None recommended by PT    Recommendations for Other Services       Precautions / Restrictions Precautions Precautions: Other (comment) Recall of Precautions/Restrictions: Impaired Precaution/Restrictions Comments: watch sats Restrictions Weight Bearing Restrictions Per Provider Order: No     Mobility  Bed Mobility Overal bed mobility: Needs Assistance Bed Mobility: Sit to Supine       Sit to supine: Min assist   General bed mobility comments: min A to bring LEs fully into bed    Transfers Overall transfer level: Needs assistance Equipment used: None Transfers: Sit to/from Stand Sit to Stand: Contact guard assist            General transfer comment: CGA for safety    Ambulation/Gait Ambulation/Gait assistance: Contact guard assist Gait Distance (Feet): 320 Feet Assistive device: None Gait Pattern/deviations: Wide base of support, Step-through pattern, Decreased stride length Gait velocity: decreased     General Gait Details: Shoes donned for amb due to callouses bilat feet. x2 mild posterior LOB with pt able to self correct, zero reciprocal arm swing with pt guarded throughout   Stairs             Wheelchair Mobility     Tilt Bed    Modified Rankin (Stroke Patients Only)       Balance Overall balance assessment: Needs assistance Sitting-balance support: Feet supported, No upper extremity supported Sitting balance-Leahy Scale: Good     Standing balance support: Single extremity supported, No upper extremity supported, During functional activity Standing balance-Leahy Scale: Fair Standing balance comment: no assistive device with hallway ambulation and to bathroom, some imbalance noted, though stood to urinate at toilet without physical assist and no LOB, and washed hands at sink                            Communication Communication Communication: No apparent difficulties  Cognition Arousal: Alert Behavior During Therapy: Flat affect   PT - Cognitive impairments: History of cognitive impairments, No family/caregiver present to determine baseline                         Following commands: Impaired  Following commands impaired: Follows multi-step commands with increased time, Follows one step commands with increased time    Cueing    Exercises      General Comments General comments (skin integrity, edema, etc.): SpO2 >90% on 10L      Pertinent Vitals/Pain Pain Assessment Pain Assessment: No/denies pain Pain Intervention(s): Monitored during session    Home Living                          Prior Function            PT Goals  (current goals can now be found in the care plan section) Acute Rehab PT Goals Patient Stated Goal: home PT Goal Formulation: Patient unable to participate in goal setting Time For Goal Achievement: 09/08/23 Progress towards PT goals: Progressing toward goals    Frequency    Min 2X/week      PT Plan      Co-evaluation              AM-PAC PT "6 Clicks" Mobility   Outcome Measure  Help needed turning from your back to your side while in a flat bed without using bedrails?: A Little Help needed moving from lying on your back to sitting on the side of a flat bed without using bedrails?: A Lot Help needed moving to and from a bed to a chair (including a wheelchair)?: A Little Help needed standing up from a chair using your arms (e.g., wheelchair or bedside chair)?: A Little Help needed to walk in hospital room?: A Little Help needed climbing 3-5 steps with a railing? : Total 6 Click Score: 15    End of Session Equipment Utilized During Treatment: Gait belt;Oxygen Activity Tolerance: Patient tolerated treatment well Patient left: with call bell/phone within reach;in bed;with bed alarm set Nurse Communication: Mobility status PT Visit Diagnosis: Other abnormalities of gait and mobility (R26.89);Muscle weakness (generalized) (M62.81);Other symptoms and signs involving the nervous system (R29.898)     Time: 8657-8469 PT Time Calculation (min) (ACUTE ONLY): 23 min  Charges:    $Gait Training: 8-22 mins $Therapeutic Activity: 8-22 mins PT General Charges $$ ACUTE PT VISIT: 1 Visit                     Bradley Mcclure R. PTA Acute Rehabilitation Services Office: [phone]   Bradley Mcclure 09/01/2023, 11:57 AM

## 2023-09-02 DIAGNOSIS — J690 Pneumonitis due to inhalation of food and vomit: Secondary | ICD-10-CM | POA: Diagnosis not present

## 2023-09-02 LAB — CBC WITH DIFFERENTIAL/PLATELET
Abs Immature Granulocytes: 0 10*3/uL (ref 0.00–0.07)
Basophils Absolute: 0 10*3/uL (ref 0.0–0.1)
Basophils Relative: 0 %
Eosinophils Absolute: 0.1 10*3/uL (ref 0.0–0.5)
Eosinophils Relative: 2 %
HCT: 40.7 % (ref 39.0–52.0)
Hemoglobin: 13.9 g/dL (ref 13.0–17.0)
Lymphocytes Relative: 20 %
Lymphs Abs: 1.5 10*3/uL (ref 0.7–4.0)
MCH: 31.4 pg (ref 26.0–34.0)
MCHC: 34.2 g/dL (ref 30.0–36.0)
MCV: 91.9 fL (ref 80.0–100.0)
Monocytes Absolute: 0.3 10*3/uL (ref 0.1–1.0)
Monocytes Relative: 4 %
Neutro Abs: 5.4 10*3/uL (ref 1.7–7.7)
Neutrophils Relative %: 74 %
Platelets: 212 10*3/uL (ref 150–400)
RBC: 4.43 MIL/uL (ref 4.22–5.81)
RDW: 13 % (ref 11.5–15.5)
WBC: 7.3 10*3/uL (ref 4.0–10.5)
nRBC: 0 % (ref 0.0–0.2)
nRBC: 0 /100{WBCs}

## 2023-09-02 LAB — LEGIONELLA PNEUMOPHILA SEROGP 1 UR AG: L. pneumophila Serogp 1 Ur Ag: NEGATIVE

## 2023-09-02 LAB — GLUCOSE, CAPILLARY
Glucose-Capillary: 196 mg/dL — ABNORMAL HIGH (ref 70–99)
Glucose-Capillary: 387 mg/dL — ABNORMAL HIGH (ref 70–99)
Glucose-Capillary: 336 mg/dL — ABNORMAL HIGH (ref 70–99)
Glucose-Capillary: 178 mg/dL — ABNORMAL HIGH (ref 70–99)

## 2023-09-02 LAB — COMPREHENSIVE METABOLIC PANEL WITH GFR
ALT: 66 U/L — ABNORMAL HIGH (ref 0–44)
AST: 32 U/L (ref 15–41)
Albumin: 2.6 g/dL — ABNORMAL LOW (ref 3.5–5.0)
Alkaline Phosphatase: 76 U/L (ref 38–126)
Anion gap: 11 (ref 5–15)
BUN: 14 mg/dL (ref 6–20)
CO2: 30 mmol/L (ref 22–32)
Calcium: 8.7 mg/dL — ABNORMAL LOW (ref 8.9–10.3)
Chloride: 92 mmol/L — ABNORMAL LOW (ref 98–111)
Creatinine, Ser: 0.59 mg/dL — ABNORMAL LOW (ref 0.61–1.24)
GFR, Estimated: >60 mL/min (ref >60)
Glucose, Bld: 208 mg/dL — ABNORMAL HIGH (ref 70–99)
Potassium: 3.6 mmol/L (ref 3.5–5.1)
Sodium: 133 mmol/L — ABNORMAL LOW (ref 135–145)
Total Bilirubin: 1.1 mg/dL (ref 0.0–1.2)
Total Protein: 6.9 g/dL (ref 6.5–8.1)

## 2023-09-02 LAB — VANCOMYCIN, PEAK: Vancomycin Pk: 9 ug/mL — ABNORMAL LOW (ref 30–40)

## 2023-09-02 LAB — MAGNESIUM: Magnesium: 2 mg/dL (ref 1.7–2.4)

## 2023-09-02 LAB — PHOSPHORUS: Phosphorus: 2.7 mg/dL (ref 2.5–4.6)

## 2023-09-02 MED ORDER — INSULIN ASPART 100 UNIT/ML IJ SOLN
7.0000 [IU] | Freq: Three times a day (TID) | INTRAMUSCULAR | Status: DC
  Administered 2023-09-02 – 2023-09-05 (×9): 7 [IU] via SUBCUTANEOUS

## 2023-09-02 MED ORDER — INSULIN ASPART 100 UNIT/ML IJ SOLN
0.0000 [IU] | Freq: Three times a day (TID) | INTRAMUSCULAR | Status: DC
  Administered 2023-09-02: 11 [IU] via SUBCUTANEOUS
  Administered 2023-09-03: 3 [IU] via SUBCUTANEOUS
  Administered 2023-09-03 (×2): 5 [IU] via SUBCUTANEOUS
  Administered 2023-09-04: 11 [IU] via SUBCUTANEOUS
  Administered 2023-09-04: 2 [IU] via SUBCUTANEOUS

## 2023-09-02 MED ORDER — INSULIN ASPART 100 UNIT/ML IJ SOLN
0.0000 [IU] | Freq: Every day | INTRAMUSCULAR | Status: DC
Start: 2023-09-02 — End: 2023-09-04
  Administered 2023-09-03: 3 [IU] via SUBCUTANEOUS

## 2023-09-02 NOTE — Progress Notes (Signed)
 Mobility Specialist: Progress Note   09/02/23 1226  Mobility  Activity Ambulated with assistance in hallway  Level of Assistance Contact guard assist, steadying assist  Assistive Device None  Distance Ambulated (ft) 500 ft  Activity Response Tolerated well  Mobility Referral Yes  Mobility visit 1 Mobility  Mobility Specialist Start Time (ACUTE ONLY) 1135  Mobility Specialist Stop Time (ACUTE ONLY) 1148  Mobility Specialist Time Calculation (min) (ACUTE ONLY) 13 min    During Mobility: SpO2 90-91% 10LO2 Post Mobility: SpO2 94-95% 7LO2  Pt was agreeable to mobility session - received in bed sleeping but easily aroused. MinA for bed mobility to assist with trunk elevation. CG for STS and ambulation. SpO2 WFL on 10LO2 during ambulation. No complaints. Returned to room without fault. Left in chair on 7LO2 with all needs met, call bell in reach.   Deloria Fetch Mobility Specialist Please contact via SecureChat or Rehab office at [phone]

## 2023-09-02 NOTE — Plan of Care (Addendum)
 Pt Aox3, delayed responses, flat affect. VSS, on 7L while sleeping, 9L when awake. Tolerating diet. BG monitored. Abd soft, rounded, nontender. Voiding w/o difficulty. Incontinence care provided. Safety precautions in place, call light within reach.   Problem: Education: Goal: Knowledge of General Education information will improve Description: Including pain rating scale, medication(s)/side effects and non-pharmacologic comfort measures Outcome: Not Progressing   Problem: Health Behavior/Discharge Planning: Goal: Ability to manage health-related needs will improve Outcome: Not Progressing   Problem: Clinical Measurements: Goal: Ability to maintain clinical measurements within normal limits will improve Outcome: Not Progressing Goal: Will remain free from infection Outcome: Not Progressing Goal: Diagnostic test results will improve Outcome: Not Progressing Goal: Respiratory complications will improve Outcome: Not Progressing Goal: Cardiovascular complication will be avoided Outcome: Not Progressing   Problem: Activity: Goal: Risk for activity intolerance will decrease Outcome: Not Progressing   Problem: Nutrition: Goal: Adequate nutrition will be maintained Outcome: Not Progressing   Problem: Coping: Goal: Level of anxiety will decrease Outcome: Not Progressing   Problem: Elimination: Goal: Will not experience complications related to bowel motility Outcome: Not Progressing Goal: Will not experience complications related to urinary retention Outcome: Not Progressing   Problem: Pain Managment: Goal: General experience of comfort will improve and/or be controlled Outcome: Not Progressing   Problem: Safety: Goal: Ability to remain free from injury will improve Outcome: Not Progressing   Problem: Skin Integrity: Goal: Risk for impaired skin integrity will decrease Outcome: Not Progressing   Problem: Education: Goal: Ability to describe self-care measures that may  prevent or decrease complications (Diabetes Survival Skills Education) will improve Outcome: Not Progressing Goal: Individualized Educational Video(s) Outcome: Not Progressing   Problem: Coping: Goal: Ability to adjust to condition or change in health will improve Outcome: Not Progressing   Problem: Fluid Volume: Goal: Ability to maintain a balanced intake and output will improve Outcome: Not Progressing   Problem: Health Behavior/Discharge Planning: Goal: Ability to identify and utilize available resources and services will improve Outcome: Not Progressing Goal: Ability to manage health-related needs will improve Outcome: Not Progressing   Problem: Metabolic: Goal: Ability to maintain appropriate glucose levels will improve Outcome: Not Progressing   Problem: Nutritional: Goal: Maintenance of adequate nutrition will improve Outcome: Not Progressing Goal: Progress toward achieving an optimal weight will improve Outcome: Not Progressing   Problem: Skin Integrity: Goal: Risk for impaired skin integrity will decrease Outcome: Not Progressing   Problem: Tissue Perfusion: Goal: Adequacy of tissue perfusion will improve Outcome: Not Progressing

## 2023-09-02 NOTE — Progress Notes (Signed)
 PROGRESS NOTE    Bradley Mcclure  UJW:[id number] DOB: Sep 04, 1960 DOA: 08/24/2023 PCP: [name]  Chief Complaint  Patient presents with   Choking    Brief Narrative:   63 yo  with hx PE, T2DM, hx MRSA bacteremia, hx dysphagia and cricopharyngeal narrowing and multiple other medical issues who presented after an aspiration event while eating [place] [employer] sandwich at work.   Assessment & Plan:   Principal Problem:   Aspiration into airway Active Problems:   Uncontrolled type 2 diabetes mellitus with hyperglycemia, with long-term current use of insulin  (HCC)   SIRS (systemic inflammatory response syndrome) (HCC)   Oropharyngeal dysphagia   Acute hypoxic respiratory failure (HCC)   Aspiration pneumonitis (HCC)  Aspiration Pneumonia  Multifocal Pneumonia Acute Hypoxic Respiratory Failure CXR 5/8 with mild patchy and ill defined nodular densities in the mid right lung and left mid and lower lung (needs repeat cxr in 4-6 weeks to ensure resolution) Last fever 5/13 CXR 5/13 with improving L lung pneumonia and persistent R upper lobe pneumonia Continued significant O2 needs, on 7 L HFNC today, modest success with weaning, continue -> if unable to wean will consult pulmonology  CT chest 5/15 with multifocal pneumonia  S/p 7 days coverage for aspiration pneumonia with unasyn /augmentin , will add vanc with lack of improvement.  Notably positive MRSA PCR. Urine strep - negative, urine legionella pending.  Sputum cx if able to collect.   Dysphagia SLP eval, appreciate assistance - presumed osteophytes projecting into the pharynx and preventing epiglottic inversion with liquids  CT with prominent anteriorly oriented disc osteophytes at C2-3 result in mild impression upon posterior hypopharynx Per discussion with SLP, osteophytes prevent epiglottic inversion with liquids - risk for aspiration Not clear if anything to do, will discuss with nsgy (no surgical  indication/recommendation) continue current diet with use of chin tuck to prevent penetration.   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 Severe bilateral neuroforaminal narrowing at C5-6 and more moderate bilateral neuroforaminal narrowing at C6-7.   Outpatient nsgy follow up   T2DM At home on metformin , semaglutide Adjust basal/bolus regimen prn  Sinus Tach Follow on his chronic metop   Hx PE  Xarelto    Depression Abilify , prozac   Cognitive Impairment Unclear baseline    DVT prophylaxis: xarelto  Code Status: full Family Communication: none Disposition:   Status is: Inpatient Remains inpatient appropriate because: need for ongoing inpatient care with hypoxia   Consultants:  none  Procedures:  none  Antimicrobials:  Anti-infectives (From admission, onward)    Start     Dose/Rate Route Frequency Ordered Stop   08/31/23 0700  vancomycin  (VANCOREADY) IVPB 750 mg/150 mL        750 mg 150 mL/hr over 60 Minutes Intravenous Every 12 hours 08/30/23 1837     08/30/23 1930  vancomycin  (VANCOREADY) IVPB 2000 mg/400 mL        2,000 mg 200 mL/hr over 120 Minutes Intravenous  Once 08/30/23 1836 08/30/23 2202   08/29/23 1000  amoxicillin -clavulanate (AUGMENTIN ) 875-125 MG per tablet 1 tablet        1 tablet Oral Every 12  hours 08/29/23 0836 08/31/23 2118   08/25/23 1000  Ampicillin -Sulbactam (UNASYN ) 3 g in sodium chloride  0.9 % 100 mL IVPB  Status:  Discontinued        3 g 200 mL/hr over 30 Minutes Intravenous Every 6 hours 08/25/23 0859 08/29/23 0836        Subjective: Denies any new complaints  Objective: Vitals:   09/02/23 0029 09/02/23 0346 09/02/23 0746 09/02/23 0757  BP: 110/77 121/69  120/71  Pulse: 85 87    Resp: 18 19    Temp: 98.2 F (36.8 C) 98.5 F (36.9 C)  98.4 F (36.9 C)  TempSrc: Oral Oral  Oral  SpO2: 99% 94% 96%   Weight:  99.6 kg    Height:        Intake/Output Summary (Last 24 hours) at 09/02/2023 1439 Last data filed at 09/02/2023 0353 Gross per 24 hour  Intake 424.04 ml  Output 600 ml  Net -175.96 ml   Filed Weights   08/29/23 0345 09/01/23 0542 09/02/23 0346  Weight: 104 kg 102.5 kg 99.6 kg    Examination:  General: No acute distress. Cardiovascular: RRR Lungs: diminished, unlabored Abdomen: Soft, nontender, nondistended  Neurological: Alert and oriented 3. Moves all extremities 4 with equal strength. Cranial nerves II through XII grossly intact. Skin: Warm and dry. No rashes or lesions. Extremities: No clubbing or cyanosis. No edema.   Data Reviewed: I have personally reviewed following labs and imaging studies  CBC: Recent Labs  Lab 08/29/23 0143 08/30/23 0254 08/31/23 0333 09/01/23 0419 09/02/23 0235  WBC 8.6 9.1 8.8 9.6 7.3  NEUTROABS 6.9 7.2 6.4 7.4 5.4  HGB 13.4 13.5 13.5 13.3 13.9  HCT 38.6* 39.3 39.8 39.3 40.7  MCV 92.1 91.8 92.8 92.3 91.9  PLT 151 155 179 197 212    Basic Metabolic Panel: Recent Labs  Lab 08/29/23 0359 08/30/23 0737 08/31/23 0333 09/01/23 0419 09/02/23 0235  NA 135 135 135 134* 133*  K 3.3* 3.2* 3.9 3.6 3.6  CL 96* 93* 95* 94* 92*  CO2 28 30 31  32 30  GLUCOSE 175* 187* 252* 280* 208*  BUN 15 14 15 17 14   CREATININE 0.66 0.61 0.64 0.63 0.59*  CALCIUM 8.5* 8.7* 8.8* 8.9 8.7*  MG  --   --  1.8 1.6* 2.0  PHOS  --   --  3.1 2.7 2.7    GFR: Estimated Creatinine Clearance: 121.9 mL/min (Indigo Barbian) (by C-G formula based on SCr of 0.59 mg/dL (L)).  Liver Function Tests: Recent Labs  Lab 08/30/23 0737 08/31/23 0333 09/01/23 0419 09/02/23 0235   AST 46* 34 35 32  ALT 101* 83* 71* 66*  ALKPHOS 78 79 80 76  BILITOT 1.2 0.9 1.2 1.1  PROT 6.4* 6.4* 6.7 6.9  ALBUMIN 2.5* 2.5* 2.5* 2.6*    CBG: Recent Labs  Lab 09/01/23 1611 09/01/23 1636 09/01/23 2031 09/02/23 0555 09/02/23 1130  GLUCAP 262* 264* 254* 196* 387*     Recent Results (from the past 240 hours)  Resp panel by RT-PCR (RSV, Flu Daanish Copes&B, Covid) Anterior Nasal Swab     Status: None   Collection Time: 08/24/23  8:10 PM   Specimen: Anterior Nasal Swab  Result Value Ref Range Status   SARS Coronavirus 2 by RT PCR NEGATIVE NEGATIVE Final   Influenza Akshat Minehart by PCR NEGATIVE NEGATIVE Final   Influenza B by PCR NEGATIVE NEGATIVE Final    Comment: (NOTE) The Xpert Xpress SARS-CoV-2/FLU/RSV plus assay is intended as an aid in the  diagnosis of influenza from Nasopharyngeal swab specimens and should not be used as Daxton Nydam sole basis for treatment. Nasal washings and aspirates are unacceptable for Xpert Xpress SARS-CoV-2/FLU/RSV testing.  Fact Sheet for Patients: BloggerCourse.com  Fact Sheet for Healthcare Providers: SeriousBroker.it  This test is not yet approved or cleared by the [place]  FDA and has been authorized for detection and/or diagnosis of SARS-CoV-2 by FDA under an Emergency Use Authorization (EUA). This EUA will remain in effect (meaning this test can be used) for the duration of the COVID-19 declaration under Section 564(b)(1) of the Act, 21 U.S.C. section 360bbb-3(b)(1), unless the authorization is terminated or revoked.     Resp Syncytial Virus by PCR NEGATIVE NEGATIVE Final    Comment: (NOTE) Fact Sheet for Patients: BloggerCourse.com  Fact Sheet for Healthcare Providers: SeriousBroker.it  This test is not yet approved or cleared by the [place]  FDA and has been authorized for detection and/or diagnosis of SARS-CoV-2 by FDA under an Emergency Use  Authorization (EUA). This EUA will remain in effect (meaning this test can be used) for the duration of the COVID-19 declaration under Section 564(b)(1) of the Act, 21 U.S.C. section 360bbb-3(b)(1), unless the authorization is terminated or revoked.  Performed at [facility], [address], [place], [place] [address]   MRSA Next Gen by PCR, Nasal     Status: Abnormal   Collection Time: 08/25/23  2:40 PM   Specimen: Nasal Mucosa; Nasal Swab  Result Value Ref Range Status   MRSA by PCR Next Gen DETECTED (Ashtin Melichar) NOT DETECTED Final    Comment: RESULT CALLED TO, READ BACK BY AND VERIFIED WITH: RN Amedeo Bailiff on [phone] @1715  by SM (NOTE) The GeneXpert MRSA Assay (FDA approved for NASAL specimens only), is one component of [facility]oy [facility]ettway comprehensive MRSA colonization surveillance program. It is not intended to diagnose MRSA infection nor to guide or monitor treatment for MRSA infections. Test performance is not FDA approved in patients less than 40 years old. Performed at [facility], [address] [place], [place] [address]          Radiology Studies: ECHOCARDIOGRAM COMPLETE Result Date: 08/31/2023    ECHOCARDIOGRAM REPORT   Patient Name:   Bradley Mcclure [facility] Date of Exam: 08/31/2023 Medical Rec #:  [id number]          Height:       73.0 in Accession #:    [id number]         Weight:       229.3 lb Date of Birth:  1960-10-24          BSA:          2.280 m Patient Age:    62 years           BP:           131/72 mmHg Patient Gender: M                  HR:           94 bpm. Exam Location:  Inpatient Procedure: 2D Echo, Cardiac Doppler and Color Doppler (Both Spectral and Color            Flow Doppler were utilized during procedure). Indications:    other abnormalities of the heart R00.8  History:        Patient has prior history of Echocardiogram examinations, most                 recent  09/07/2022. Risk Factors:Hypertension, Diabetes and                 Dyslipidemia.  Sonographer:    Terrilee Few RCS Referring Phys: [phone] Kiandra Sanguinetti CALDWELL POWELL JR IMPRESSIONS  1. Left ventricular ejection fraction, by estimation, is 60 to 65%. The left ventricle has normal function. The left ventricle has no regional wall motion abnormalities. There is mild left ventricular hypertrophy of the basal-septal segment. Left ventricular diastolic parameters were normal.  2. Right ventricular systolic function is normal. The right ventricular size is normal. Tricuspid regurgitation signal is inadequate for assessing PA pressure.  3. The mitral valve is normal in structure. No evidence of mitral valve regurgitation. No evidence of mitral stenosis.  4. The aortic valve is tricuspid. Aortic valve regurgitation is not visualized. Aortic valve sclerosis/calcification is present, without any evidence of aortic stenosis. Aortic valve Vmax measures 1.48 m/s. FINDINGS  Left Ventricle: Left ventricular ejection fraction, by estimation, is 60 to 65%. The left ventricle has normal function. The left ventricle has no regional wall motion abnormalities. The left ventricular internal cavity size was normal in size. There is  mild left ventricular hypertrophy of the basal-septal segment. Left ventricular diastolic parameters were normal. Normal left ventricular filling pressure. Right Ventricle: The right ventricular size is normal. No increase in right ventricular wall thickness. Right ventricular systolic function is normal. Tricuspid regurgitation signal is inadequate for assessing PA pressure. Left Atrium: Left atrial size was normal in size. Right Atrium: Right atrial size was normal in size. Pericardium: There is no evidence of pericardial effusion. Mitral Valve: The mitral valve is normal in structure. No evidence of mitral valve regurgitation. No evidence of mitral valve stenosis. Tricuspid Valve: The tricuspid valve is normal in structure. Tricuspid valve regurgitation is not demonstrated. No evidence of tricuspid stenosis. Aortic  Valve: The aortic valve is tricuspid. Aortic valve regurgitation is not visualized. Aortic valve sclerosis/calcification is present, without any evidence of aortic stenosis. Aortic valve peak gradient measures 8.8 mmHg. Pulmonic Valve: The pulmonic valve was normal in structure. Pulmonic valve regurgitation is not visualized. No evidence of pulmonic stenosis. Aorta: The aortic root is normal in size and structure. Venous: The inferior vena cava was not well visualized. IAS/Shunts: No atrial level shunt detected by color flow Doppler.  LEFT VENTRICLE PLAX 2D LVIDd:         4.00 cm   Diastology LVIDs:         2.70 cm   LV e' medial:    8.16 cm/s LV PW:         1.10 cm   LV E/e' medial:  8.0 LV IVS:        1.20 cm   LV e' lateral:   11.10 cm/s LVOT diam:     2.40 cm   LV E/e' lateral: 5.9 LV SV:         65 LV SV Index:   28 LVOT Area:     4.52 cm  RIGHT VENTRICLE RV S prime:     14.80 cm/s TAPSE (M-mode): 2.5 cm LEFT ATRIUM             Index        RIGHT ATRIUM           Index LA diam:        3.70 cm 1.62 cm/m   RA Area:     13.20 cm LA Vol (A2C):   57.4 ml 25.17 ml/m  RA Volume:  25.95 ml  11.38 ml/m LA Vol (A4C):   69.4 ml 30.44 ml/m LA Biplane Vol: 67.9 ml 29.78 ml/m  AORTIC VALVE AV Area (Vmax): 2.44 cm AV Vmax:        148.00 cm/s AV Peak Grad:   8.8 mmHg LVOT Vmax:      79.70 cm/s LVOT Vmean:     53.500 cm/s LVOT VTI:       0.143 m  AORTA Ao Root diam: 3.50 cm Ao Asc diam:  3.30 cm MITRAL VALVE MV Area (PHT): 4.86 cm    SHUNTS MV Decel Time: 156 msec    Systemic VTI:  0.14 m MV E velocity: 65.50 cm/s  Systemic Diam: 2.40 cm MV Patrcia Schnepp velocity: 81.80 cm/s MV E/Dreydon Cardenas ratio:  0.80 Gaylyn Keas MD Electronically signed by Gaylyn Keas MD Signature Date/Time: 08/31/2023/5:18:13 PM    Final         Scheduled Meds:  albuterol   2.5 mg Nebulization BID   ARIPiprazole   2 mg Oral Daily   furosemide   40 mg Intravenous Q12H   insulin  aspart  0-5 Units Subcutaneous QHS   insulin  aspart  0-9 Units Subcutaneous TID WC    insulin  aspart  4 Units Subcutaneous TID WC   insulin  glargine-yfgn  30 Units Subcutaneous Daily   metoprolol  tartrate  25 mg Oral BID   pantoprazole   40 mg Oral Daily   polyethylene glycol  17 g Oral BID   rivaroxaban   20 mg Oral Q supper   senna  1 tablet Oral QHS   Continuous Infusions:  vancomycin  750 mg (09/02/23 0627)     LOS: 9 days    Time spent: over 30 min     Donnetta Gains, MD [facility]   To contact the attending provider between 7A-7P or the covering provider during after hours 7P-7A, please log into the web site [email or web address] and access using universal [place] password for that web site. If you do not have the password, please call the hospital operator.  09/02/2023, 2:39 PM

## 2023-09-02 NOTE — Plan of Care (Signed)
   Problem: Education: Goal: Knowledge of General Education information will improve Description: Including pain rating scale, medication(s)/side effects and non-pharmacologic comfort measures Outcome: Progressing   Problem: Activity: Goal: Risk for activity intolerance will decrease Outcome: Progressing   Problem: Coping: Goal: Level of anxiety will decrease Outcome: Progressing

## 2023-09-03 ENCOUNTER — Inpatient Hospital Stay (HOSPITAL_COMMUNITY)

## 2023-09-03 DIAGNOSIS — J690 Pneumonitis due to inhalation of food and vomit: Secondary | ICD-10-CM | POA: Diagnosis not present

## 2023-09-03 LAB — COMPREHENSIVE METABOLIC PANEL WITH GFR
ALT: 63 U/L — ABNORMAL HIGH (ref 0–44)
AST: 39 U/L (ref 15–41)
Albumin: 2.5 g/dL — ABNORMAL LOW (ref 3.5–5.0)
Alkaline Phosphatase: 74 U/L (ref 38–126)
Anion gap: 9 (ref 5–15)
BUN: 12 mg/dL (ref 6–20)
CO2: 31 mmol/L (ref 22–32)
Calcium: 8.6 mg/dL — ABNORMAL LOW (ref 8.9–10.3)
Chloride: 92 mmol/L — ABNORMAL LOW (ref 98–111)
Creatinine, Ser: 0.55 mg/dL — ABNORMAL LOW (ref 0.61–1.24)
GFR, Estimated: >60 mL/min (ref >60)
Glucose, Bld: 176 mg/dL — ABNORMAL HIGH (ref 70–99)
Potassium: 3.2 mmol/L — ABNORMAL LOW (ref 3.5–5.1)
Sodium: 132 mmol/L — ABNORMAL LOW (ref 135–145)
Total Bilirubin: 0.8 mg/dL (ref 0.0–1.2)
Total Protein: 6.6 g/dL (ref 6.5–8.1)

## 2023-09-03 LAB — CBC WITH DIFFERENTIAL/PLATELET
Abs Immature Granulocytes: 0.06 10*3/uL (ref 0.00–0.07)
Basophils Absolute: 0.1 10*3/uL (ref 0.0–0.1)
Basophils Relative: 1 %
Eosinophils Absolute: 0.2 10*3/uL (ref 0.0–0.5)
Eosinophils Relative: 2 %
HCT: 40.8 % (ref 39.0–52.0)
Hemoglobin: 13.7 g/dL (ref 13.0–17.0)
Immature Granulocytes: 1 %
Lymphocytes Relative: 19 %
Lymphs Abs: 1.4 10*3/uL (ref 0.7–4.0)
MCH: 31.2 pg (ref 26.0–34.0)
MCHC: 33.6 g/dL (ref 30.0–36.0)
MCV: 92.9 fL (ref 80.0–100.0)
Monocytes Absolute: 0.5 10*3/uL (ref 0.1–1.0)
Monocytes Relative: 7 %
Neutro Abs: 5.3 10*3/uL (ref 1.7–7.7)
Neutrophils Relative %: 70 %
Platelets: 204 10*3/uL (ref 150–400)
RBC: 4.39 MIL/uL (ref 4.22–5.81)
RDW: 12.7 % (ref 11.5–15.5)
WBC: 7.5 10*3/uL (ref 4.0–10.5)
nRBC: 0 % (ref 0.0–0.2)

## 2023-09-03 LAB — GLUCOSE, CAPILLARY
Glucose-Capillary: 186 mg/dL — ABNORMAL HIGH (ref 70–99)
Glucose-Capillary: 240 mg/dL — ABNORMAL HIGH (ref 70–99)
Glucose-Capillary: 220 mg/dL — ABNORMAL HIGH (ref 70–99)
Glucose-Capillary: 282 mg/dL — ABNORMAL HIGH (ref 70–99)

## 2023-09-03 LAB — MAGNESIUM: Magnesium: 1.7 mg/dL (ref 1.7–2.4)

## 2023-09-03 LAB — PHOSPHORUS: Phosphorus: 2.7 mg/dL (ref 2.5–4.6)

## 2023-09-03 LAB — PROCALCITONIN: Procalcitonin: <0.1 ng/mL

## 2023-09-03 MED ORDER — POTASSIUM CHLORIDE CRYS ER 20 MEQ PO TBCR
40.0000 meq | EXTENDED_RELEASE_TABLET | ORAL | Status: AC
  Administered 2023-09-03 (×2): 40 meq via ORAL
  Filled 2023-09-03 (×2): qty 2

## 2023-09-03 MED ORDER — MAGNESIUM OXIDE -MG SUPPLEMENT 400 (240 MG) MG PO TABS
400.0000 mg | ORAL_TABLET | Freq: Every day | ORAL | Status: DC
  Administered 2023-09-03 – 2023-09-14 (×12): 400 mg via ORAL
  Filled 2023-09-03 (×12): qty 1

## 2023-09-03 MED ORDER — VANCOMYCIN HCL 1500 MG/300ML IV SOLN
1500.0000 mg | Freq: Two times a day (BID) | INTRAVENOUS | Status: AC
  Administered 2023-09-03 – 2023-09-07 (×10): 1500 mg via INTRAVENOUS
  Filled 2023-09-03 (×10): qty 300

## 2023-09-03 NOTE — Progress Notes (Signed)
 Nurse requested Mobility Specialist to perform oxygen saturation test with pt which includes removing pt from oxygen both at rest and while ambulating.  Below are the results from that testing.     Patient Saturations on Room Air at Rest = spO2 86%  Patient Saturations on 8 Liters of oxygen while Ambulating = sp02 93%  At end of testing pt left in room on 6  Liters of oxygen.  Reported results to nurse.

## 2023-09-03 NOTE — Progress Notes (Signed)
 PROGRESS NOTE    Bradley Mcclure  WUJ:[id number] DOB: [birth year] DOA: 08/24/2023 PCP: [name]  Chief Complaint  Patient presents with   Choking    Brief Narrative:   63 yo  with hx PE, T2DM, hx MRSA bacteremia, hx dysphagia and cricopharyngeal narrowing and multiple other medical issues who presented after an aspiration event while eating Bradley Mcclure sandwich at work.   Assessment & Plan:   Principal Problem:   Aspiration into airway Active Problems:   Uncontrolled type 2 diabetes mellitus with hyperglycemia, with long-term current use of insulin  (HCC)   SIRS (systemic inflammatory response syndrome) (HCC)   Oropharyngeal dysphagia   Acute hypoxic respiratory failure (HCC)   Aspiration pneumonitis (HCC)  Aspiration Pneumonia  Multifocal Pneumonia Acute Hypoxic Respiratory Failure CXR 5/8 with mild patchy and ill defined nodular densities in the mid right lung and left mid and lower lung (needs repeat cxr in 4-6 weeks to ensure resolution) Last fever 5/13 CXR 5/13 with improving L lung pneumonia and persistent R upper lobe pneumonia CXR 5/18 with R and progressive L sided multifocal pneumonia Continued significant O2 needs, on 7 L HFNC today, modest success with weaning, continue -> if unable to wean will consult pulmonology (plan for 5/19 am if O2 remains constant).  CT chest 5/15 with multifocal pneumonia  S/p 7 days coverage for aspiration pneumonia with unasyn /augmentin , will add vanc (5/14-present) with lack of improvement.  Notably positive MRSA PCR. Urine strep - negative, urine legionella pending.  Sputum cx if able to collect.   Dysphagia SLP eval, appreciate assistance - presumed osteophytes projecting into the pharynx and preventing epiglottic inversion with liquids  CT with prominent anteriorly oriented disc osteophytes at C2-3 result in mild impression upon posterior hypopharynx Per discussion with SLP, osteophytes prevent epiglottic inversion with liquids -  risk for aspiration Not clear if anything to do, will discuss with nsgy (no surgical indication/recommendation) continue current diet with use of chin tuck to prevent penetration.   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 Severe bilateral neuroforaminal narrowing at C5-6 and more moderate bilateral neuroforaminal narrowing at C6-7.   Outpatient nsgy follow up   T2DM At home on metformin , semaglutide Adjust basal/bolus regimen prn  Sinus Tach Follow on his chronic metop   Hx PE  Xarelto    Depression Abilify , prozac   Cognitive Impairment Unclear baseline    DVT prophylaxis: xarelto  Code Status: full Family Communication: none Disposition:   Status is: Inpatient Remains inpatient appropriate because: need for ongoing inpatient care with hypoxia   Consultants:  none  Procedures:  Echo IMPRESSIONS     1. Left ventricular ejection fraction, by estimation, is 60 to 65%. The  left ventricle has normal function. The left ventricle has no regional  wall motion abnormalities. There is mild left ventricular hypertrophy of  the basal-septal segment. Left  ventricular diastolic parameters were normal.   2. Right ventricular systolic function is normal. The right ventricular  size is normal. Tricuspid regurgitation signal is inadequate for assessing  PA pressure.   3. The mitral valve is normal in structure.  No evidence of mitral valve  regurgitation. No evidence of mitral stenosis.   4. The aortic valve is tricuspid. Aortic valve regurgitation is not   visualized. Aortic valve sclerosis/calcification is present, without any  evidence of aortic stenosis. Aortic valve Vmax measures 1.48 m/s.   Antimicrobials:  Anti-infectives (From admission, onward)    Start     Dose/Rate Route Frequency Ordered Stop   09/03/23 0200  vancomycin  (VANCOREADY) IVPB 1500 mg/300 mL        1,500 mg 150 mL/hr over 120 Minutes Intravenous 2 times daily 09/03/23 0121     08/31/23 0700  vancomycin  (VANCOREADY) IVPB 750 mg/150 mL  Status:  Discontinued        750 mg 150 mL/hr over 60 Minutes Intravenous Every 12 hours 08/30/23 1837 09/03/23 0121   08/30/23 1930  vancomycin  (VANCOREADY) IVPB 2000 mg/400 mL        2,000 mg 200 mL/hr over 120 Minutes Intravenous  Once 08/30/23 1836 08/30/23 2202   08/29/23 1000  amoxicillin -clavulanate (AUGMENTIN ) 875-125 MG per tablet 1 tablet        1 tablet Oral Every 12 hours 08/29/23 0836 08/31/23 2118   08/25/23 1000  Ampicillin -Sulbactam (UNASYN ) 3 g in sodium chloride  0.9 % 100 mL IVPB  Status:  Discontinued        3 g 200 mL/hr over 30 Minutes Intravenous Every 6 hours 08/25/23 0859 08/29/23 0836       Subjective: No complaints  Objective: Vitals:   09/03/23 0429 09/03/23 0800 09/03/23 0834 09/03/23 1035  BP: 114/62 116/66  127/62  Pulse: 80 82    Resp: (!) 26 20    Temp: 98.4 F (36.9 C) 97.7 F (36.5 C)    TempSrc: Oral Oral    SpO2: 93% 91% 92%   Weight: 99.8 kg     Height:        Intake/Output Summary (Last 24 hours) at 09/03/2023 1534 Last data filed at 09/03/2023 1300 Gross per 24 hour  Intake 1260 ml  Output 1950 ml  Net -690 ml   Filed Weights   09/01/23 0542 09/02/23 0346 09/03/23 0429  Weight: 102.5 kg 99.6 kg 99.8 kg    Examination:  General: No acute distress. Cardiovascular: RRR Lungs: hypoxic to 60's on RA (O2 had fallen off), unlabored - O2 improved with application of 8 L HFNC Abdomen: Soft, nontender, nondistended  Neurological: Alert and oriented 3. Moves all extremities 4  with equal strength. Cranial nerves II through XII grossly intact. Extremities: No clubbing or cyanosis. No edema.   Data Reviewed: I have personally reviewed following labs and imaging studies  CBC: Recent Labs  Lab 08/30/23 0254 08/31/23 0333 09/01/23 0419 09/02/23 0235 09/03/23 0259  WBC 9.1 8.8 9.6 7.3 7.5  NEUTROABS 7.2 6.4 7.4 5.4 5.3  HGB 13.5 13.5 13.3 13.9 13.7  HCT 39.3 39.8 39.3 40.7 40.8  MCV 91.8 92.8 92.3 91.9 92.9  PLT 155 179 197 212 204    Basic Metabolic Panel: Recent Labs  Lab 08/30/23 0737 08/31/23 0333 09/01/23 0419 09/02/23 0235 09/03/23 0259  NA 135 135 134* 133* 132*  K 3.2* 3.9 3.6 3.6 3.2*  CL 93* 95* 94* 92* 92*  CO2 30 31 32 30 31  GLUCOSE 187* 252* 280* 208* 176*  BUN 14 15 17 14 12   CREATININE 0.61 0.64 0.63 0.59* 0.55*  CALCIUM 8.7* 8.8* 8.9 8.7* 8.6*  MG  --  1.8 1.6* 2.0 1.7  PHOS  --  3.1 2.7 2.7  2.7    GFR: Estimated Creatinine Clearance: 122.1 mL/min (Caron Tardif) (by C-G formula based on SCr of 0.55 mg/dL (L)).  Liver Function Tests: Recent Labs  Lab 08/30/23 0737 08/31/23 0333 09/01/23 0419 09/02/23 0235 09/03/23 0259  AST 46* 34 35 32 39  ALT 101* 83* 71* 66* 63*  ALKPHOS 78 79 80 76 74  BILITOT 1.2 0.9 1.2 1.1 0.8  PROT 6.4* 6.4* 6.7 6.9 6.6  ALBUMIN 2.5* 2.5* 2.5* 2.6* 2.5*    CBG: Recent Labs  Lab 09/02/23 1130 09/02/23 1623 09/02/23 2120 09/03/23 0546 09/03/23 1213  GLUCAP 387* 336* 178* 186* 240*     Recent Results (from the past 240 hours)  Resp panel by RT-PCR (RSV, Flu Skyrah Krupp&B, Covid) Anterior Nasal Swab     Status: None   Collection Time: 08/24/23  8:10 PM   Specimen: Anterior Nasal Swab  Result Value Ref Range Status   SARS Coronavirus 2 by RT PCR NEGATIVE NEGATIVE Final   Influenza Rasa Degrazia by PCR NEGATIVE NEGATIVE Final   Influenza B by PCR NEGATIVE NEGATIVE Final    Comment: (NOTE) The Xpert Xpress SARS-CoV-2/FLU/RSV plus assay is intended as an aid in the diagnosis of influenza from Nasopharyngeal swab  specimens and should not be used as Taniah Reinecke sole basis for treatment. Nasal washings and aspirates are unacceptable for Xpert Xpress SARS-CoV-2/FLU/RSV testing.  Fact Sheet for Patients: BloggerCourse.com  Fact Sheet for Healthcare Providers: SeriousBroker.it  This test is not yet approved or cleared by the [place]  FDA and has been authorized for detection and/or diagnosis of SARS-CoV-2 by FDA under an Emergency Use Authorization (EUA). This EUA will remain in effect (meaning this test can be used) for the duration of the COVID-19 declaration under Section 564(b)(1) of the Act, 21 U.S.C. section 360bbb-3(b)(1), unless the authorization is terminated or revoked.     Resp Syncytial Virus by PCR NEGATIVE NEGATIVE Final    Comment: (NOTE) Fact Sheet for Patients: BloggerCourse.com  Fact Sheet for Healthcare Providers: SeriousBroker.it  This test is not yet approved or cleared by the [place]  FDA and has been authorized for detection and/or diagnosis of SARS-CoV-2 by FDA under an Emergency Use Authorization (EUA). This EUA will remain in effect (meaning this test can be used) for the duration of the COVID-19 declaration under Section 564(b)(1) of the Act, 21 U.S.C. section 360bbb-3(b)(1), unless the authorization is terminated or revoked.  Performed at [facility], [address] [place], [place] [address]   MRSA Next Gen by PCR, Nasal     Status: Abnormal   Collection Time: 08/25/23  2:40 PM   Specimen: Nasal Mucosa; Nasal Swab  Result Value Ref Range Status   MRSA by PCR Next Gen DETECTED (Venna Berberich) NOT DETECTED Final    Comment: RESULT CALLED TO, READ BACK BY AND VERIFIED WITH: RN Clinton W on [phone] @1715  by SM (NOTE) The GeneXpert MRSA Assay (FDA approved for NASAL specimens only), is one component of Bess Saltzman comprehensive MRSA colonization surveillance program. It is not  intended to diagnose MRSA infection nor to guide or monitor treatment for MRSA infections. Test performance is not FDA approved in patients less than 4 years old. Performed at [facility], [address], [place], [place] [address]          Radiology Studies: DG CHEST PORT 1 VIEW Result Date: 09/03/2023 CLINICAL DATA:  Shortness of breath.  History of aspiration EXAM: PORTABLE CHEST 1 VIEW COMPARISON:  08/29/2023 FINDINGS: Patient rotated to the right.  Normal heart size. No pleural effusion or pneumothorax. Interstitial and airspace disease again identified bilaterally. Relatively similar in the right upper lobe and right lung base. Felt to be slightly increased in the left mid and upper lung. IMPRESSION: Similar right and progressive left-sided multifocal pneumonia. Electronically Signed   By: Lore Rode M.D.   On: 09/03/2023 12:44        Scheduled Meds:  albuterol   2.5 mg Nebulization BID   ARIPiprazole   2 mg Oral Daily   furosemide   40 mg Intravenous Q12H   insulin  aspart  0-15 Units Subcutaneous TID WC   insulin  aspart  0-5 Units Subcutaneous QHS   insulin  aspart  7 Units Subcutaneous TID WC   insulin  glargine-yfgn  30 Units Subcutaneous Daily   magnesium  oxide  400 mg Oral Daily   metoprolol  tartrate  25 mg Oral BID   pantoprazole   40 mg Oral Daily   polyethylene glycol  17 g Oral BID   rivaroxaban   20 mg Oral Q supper   senna  1 tablet Oral QHS   Continuous Infusions:  vancomycin  1,500 mg (09/03/23 1055)     LOS: 10 days    Time spent: over 30 min     Donnetta Gains, MD [facility]   To contact the attending provider between 7A-7P or the covering provider during after hours 7P-7A, please log into the web site [email or web address] and access using universal [place] password for that web site. If you do not have the password, please call the hospital operator.  09/03/2023, 3:34 PM

## 2023-09-03 NOTE — Plan of Care (Addendum)
 Pt resting throughout shift. Aox4, delayed responses. VSS on 5-7L [place], weak cough. Tolerating diet. Voiding w/o difficulty. Safety precautions in place, call light within reach.   Problem: Education: Goal: Knowledge of General Education information will improve Description: Including pain rating scale, medication(s)/side effects and non-pharmacologic comfort measures Outcome: Progressing   Problem: Health Behavior/Discharge Planning: Goal: Ability to manage health-related needs will improve Outcome: Progressing   Problem: Clinical Measurements: Goal: Ability to maintain clinical measurements within normal limits will improve Outcome: Progressing Goal: Will remain free from infection Outcome: Progressing Goal: Diagnostic test results will improve Outcome: Progressing Goal: Respiratory complications will improve Outcome: Progressing Goal: Cardiovascular complication will be avoided Outcome: Progressing   Problem: Activity: Goal: Risk for activity intolerance will decrease Outcome: Progressing   Problem: Nutrition: Goal: Adequate nutrition will be maintained Outcome: Progressing   Problem: Coping: Goal: Level of anxiety will decrease Outcome: Progressing   Problem: Elimination: Goal: Will not experience complications related to bowel motility Outcome: Progressing Goal: Will not experience complications related to urinary retention Outcome: Progressing   Problem: Pain Managment: Goal: General experience of comfort will improve and/or be controlled Outcome: Progressing   Problem: Safety: Goal: Ability to remain free from injury will improve Outcome: Progressing   Problem: Skin Integrity: Goal: Risk for impaired skin integrity will decrease Outcome: Progressing   Problem: Education: Goal: Ability to describe self-care measures that may prevent or decrease complications (Diabetes Survival Skills Education) will improve Outcome: Progressing Goal: Individualized  Educational Video(s) Outcome: Progressing   Problem: Coping: Goal: Ability to adjust to condition or change in health will improve Outcome: Progressing   Problem: Fluid Volume: Goal: Ability to maintain a balanced intake and output will improve Outcome: Progressing   Problem: Health Behavior/Discharge Planning: Goal: Ability to identify and utilize available resources and services will improve Outcome: Progressing Goal: Ability to manage health-related needs will improve Outcome: Progressing   Problem: Metabolic: Goal: Ability to maintain appropriate glucose levels will improve Outcome: Progressing   Problem: Nutritional: Goal: Maintenance of adequate nutrition will improve Outcome: Progressing Goal: Progress toward achieving an optimal weight will improve Outcome: Progressing   Problem: Skin Integrity: Goal: Risk for impaired skin integrity will decrease Outcome: Progressing   Problem: Tissue Perfusion: Goal: Adequacy of tissue perfusion will improve Outcome: Progressing

## 2023-09-03 NOTE — Progress Notes (Signed)
 Mobility Specialist: Progress Note   09/03/23 1032  Mobility  Activity Ambulated with assistance in hallway  Level of Assistance Contact guard assist, steadying assist  Assistive Device None  Distance Ambulated (ft) 400 ft  Activity Response Tolerated well  Mobility Referral Yes  Mobility visit 1 Mobility  Mobility Specialist Start Time (ACUTE ONLY) 1000  Mobility Specialist Stop Time (ACUTE ONLY) 1028  Mobility Specialist Time Calculation (min) (ACUTE ONLY) 28 min    Pt was agreeable to mobility session - received in bed. Completed ambulatiory sat test (see following note). No complaints. MinG throughout. SpO2 WFL on 8LO2. Returned to room without fault. Left in chair with all needs met, call bell in reach.   Deloria Fetch Mobility Specialist Please contact via SecureChat or Rehab office at [phone]

## 2023-09-03 NOTE — Progress Notes (Signed)
 Pharmacy Antibiotic Note  Bradley Mcclure is a 63 y.o. male admitted on 08/24/2023 with pneumonia.  Pharmacy has been consulted for vancomycin  dosing.  Vancomycin  peak tonight subtherapeutic  Plan: Change Vancomycin  1500 mg IV q12h  Height: 6\' 1"  (185.4 cm) Weight: 99.6 kg (219 lb 9.3 oz) IBW/kg (Calculated) : 79.9  Temp (24hrs), Avg:98.1 F (36.7 C), Min:97.6 F (36.4 C), Max:98.5 F (36.9 C)  Recent Labs  Lab 08/27/23 1105 08/27/23 1358 08/28/23 0301 08/29/23 0143 08/29/23 0359 08/30/23 0254 08/30/23 0737 08/31/23 0333 09/01/23 0419 09/02/23 0235 09/02/23 2127  WBC  --   --    < > 8.6  --  9.1  --  8.8 9.6 7.3  --   CREATININE  --   --    < >  --  0.66  --  0.61 0.64 0.63 0.59*  --   LATICACIDVEN 1.6 1.8  --   --   --   --   --   --   --   --   --   VANCOPEAK  --   --   --   --   --   --   --   --   --   --  9*   < > = values in this interval not displayed.    Estimated Creatinine Clearance: 121.9 mL/min (A) (by C-G formula based on SCr of 0.59 mg/dL (L)).    Allergies  Allergen Reactions   Empagliflozin  Other (See Comments)    Acidosis    Antimicrobials this admission: Vanc 5/14>  Dose adjustments this admission:   Microbiology results: 5/9 MRSA pos 5/8 RVP neg  Claudine Cullens, PharmD, BCPS  09/03/2023 1:18 AM

## 2023-09-04 DIAGNOSIS — J9601 Acute respiratory failure with hypoxia: Secondary | ICD-10-CM | POA: Diagnosis not present

## 2023-09-04 DIAGNOSIS — T17908A Unspecified foreign body in respiratory tract, part unspecified causing other injury, initial encounter: Secondary | ICD-10-CM | POA: Diagnosis not present

## 2023-09-04 DIAGNOSIS — J690 Pneumonitis due to inhalation of food and vomit: Secondary | ICD-10-CM | POA: Diagnosis not present

## 2023-09-04 LAB — CBC WITH DIFFERENTIAL/PLATELET
Abs Immature Granulocytes: 0.07 10*3/uL (ref 0.00–0.07)
Basophils Absolute: 0.1 10*3/uL (ref 0.0–0.1)
Basophils Relative: 1 %
Eosinophils Absolute: 0.2 10*3/uL (ref 0.0–0.5)
Eosinophils Relative: 2 %
HCT: 39.2 % (ref 39.0–52.0)
Hemoglobin: 13.3 g/dL (ref 13.0–17.0)
Immature Granulocytes: 1 %
Lymphocytes Relative: 26 %
Lymphs Abs: 1.9 10*3/uL (ref 0.7–4.0)
MCH: 31.4 pg (ref 26.0–34.0)
MCHC: 33.9 g/dL (ref 30.0–36.0)
MCV: 92.7 fL (ref 80.0–100.0)
Monocytes Absolute: 0.6 10*3/uL (ref 0.1–1.0)
Monocytes Relative: 8 %
Neutro Abs: 4.6 10*3/uL (ref 1.7–7.7)
Neutrophils Relative %: 62 %
Platelets: 228 10*3/uL (ref 150–400)
RBC: 4.23 MIL/uL (ref 4.22–5.81)
RDW: 12.6 % (ref 11.5–15.5)
WBC: 7.4 10*3/uL (ref 4.0–10.5)
nRBC: 0 % (ref 0.0–0.2)

## 2023-09-04 LAB — COMPREHENSIVE METABOLIC PANEL WITH GFR
ALT: 50 U/L — ABNORMAL HIGH (ref 0–44)
AST: 24 U/L (ref 15–41)
Albumin: 2.5 g/dL — ABNORMAL LOW (ref 3.5–5.0)
Alkaline Phosphatase: 69 U/L (ref 38–126)
Anion gap: 8 (ref 5–15)
BUN: 8 mg/dL (ref 6–20)
CO2: 30 mmol/L (ref 22–32)
Calcium: 8.5 mg/dL — ABNORMAL LOW (ref 8.9–10.3)
Chloride: 94 mmol/L — ABNORMAL LOW (ref 98–111)
Creatinine, Ser: 0.55 mg/dL — ABNORMAL LOW (ref 0.61–1.24)
GFR, Estimated: >60 mL/min (ref >60)
Glucose, Bld: 170 mg/dL — ABNORMAL HIGH (ref 70–99)
Potassium: 3.7 mmol/L (ref 3.5–5.1)
Sodium: 132 mmol/L — ABNORMAL LOW (ref 135–145)
Total Bilirubin: 1 mg/dL (ref 0.0–1.2)
Total Protein: 6.4 g/dL — ABNORMAL LOW (ref 6.5–8.1)

## 2023-09-04 LAB — GLUCOSE, CAPILLARY
Glucose-Capillary: 147 mg/dL — ABNORMAL HIGH (ref 70–99)
Glucose-Capillary: 325 mg/dL — ABNORMAL HIGH (ref 70–99)
Glucose-Capillary: 177 mg/dL — ABNORMAL HIGH (ref 70–99)
Glucose-Capillary: 356 mg/dL — ABNORMAL HIGH (ref 70–99)

## 2023-09-04 LAB — PROCALCITONIN: Procalcitonin: <0.1 ng/mL

## 2023-09-04 LAB — MAGNESIUM: Magnesium: 1.7 mg/dL (ref 1.7–2.4)

## 2023-09-04 LAB — C-REACTIVE PROTEIN: CRP: 1.8 mg/dL — ABNORMAL HIGH (ref <1.0)

## 2023-09-04 LAB — PHOSPHORUS: Phosphorus: 2.3 mg/dL — ABNORMAL LOW (ref 2.5–4.6)

## 2023-09-04 MED ORDER — FUROSEMIDE 10 MG/ML IJ SOLN
40.0000 mg | Freq: Two times a day (BID) | INTRAMUSCULAR | Status: DC
  Administered 2023-09-04 – 2023-09-09 (×10): 40 mg via INTRAVENOUS
  Filled 2023-09-04 (×10): qty 4

## 2023-09-04 MED ORDER — INSULIN ASPART 100 UNIT/ML IJ SOLN
0.0000 [IU] | Freq: Every day | INTRAMUSCULAR | Status: DC
  Administered 2023-09-04: 5 [IU] via SUBCUTANEOUS
  Administered 2023-09-05: 3 [IU] via SUBCUTANEOUS
  Administered 2023-09-06 – 2023-09-07 (×2): 4 [IU] via SUBCUTANEOUS
  Administered 2023-09-08: 5 [IU] via SUBCUTANEOUS
  Administered 2023-09-09 – 2023-09-10 (×2): 2 [IU] via SUBCUTANEOUS
  Administered 2023-09-11: 4 [IU] via SUBCUTANEOUS
  Administered 2023-09-12: 3 [IU] via SUBCUTANEOUS

## 2023-09-04 MED ORDER — METHYLPREDNISOLONE SODIUM SUCC 125 MG IJ SOLR
125.0000 mg | Freq: Every day | INTRAMUSCULAR | Status: DC
  Administered 2023-09-04 – 2023-09-05 (×2): 125 mg via INTRAVENOUS
  Filled 2023-09-04 (×2): qty 2

## 2023-09-04 MED ORDER — INSULIN ASPART 100 UNIT/ML IJ SOLN
0.0000 [IU] | Freq: Three times a day (TID) | INTRAMUSCULAR | Status: DC
  Administered 2023-09-05: 15 [IU] via SUBCUTANEOUS
  Administered 2023-09-05 (×2): 11 [IU] via SUBCUTANEOUS
  Administered 2023-09-06: 15 [IU] via SUBCUTANEOUS
  Administered 2023-09-06: 7 [IU] via SUBCUTANEOUS
  Administered 2023-09-06 – 2023-09-08 (×5): 11 [IU] via SUBCUTANEOUS
  Administered 2023-09-08: 7 [IU] via SUBCUTANEOUS
  Administered 2023-09-08: 11 [IU] via SUBCUTANEOUS
  Administered 2023-09-09: 7 [IU] via SUBCUTANEOUS
  Administered 2023-09-09: 15 [IU] via SUBCUTANEOUS
  Administered 2023-09-09: 11 [IU] via SUBCUTANEOUS
  Administered 2023-09-10: 7 [IU] via SUBCUTANEOUS
  Administered 2023-09-10 (×2): 15 [IU] via SUBCUTANEOUS
  Administered 2023-09-11: 4 [IU] via SUBCUTANEOUS
  Administered 2023-09-11: 11 [IU] via SUBCUTANEOUS
  Administered 2023-09-11: 15 [IU] via SUBCUTANEOUS
  Administered 2023-09-12: 4 [IU] via SUBCUTANEOUS
  Administered 2023-09-12: 11 [IU] via SUBCUTANEOUS
  Administered 2023-09-12: 4 [IU] via SUBCUTANEOUS
  Administered 2023-09-13: 11 [IU] via SUBCUTANEOUS

## 2023-09-04 NOTE — Progress Notes (Signed)
 Physical Therapy Treatment Patient Details Name: Bradley Mcclure MRN: [id number] DOB: Oct 07, 1960 Today's Date: 09/04/2023   History of Present Illness Patient is a 63 y/o male admitted 08/24/23 with aspiration when choking on roast beef sandwich.  Pt needing 4L O2 with SpO2 85% and HR 120's, RR 20's.  PMH positive for h/o esophageal dysphagia, cognitive impairment, GERD, DM2, PE on anticoagulation, NAFLD, depression, HTN and HLD.    PT Comments  Patient received in recliner, call light on. He called out to get alarm to stop. O2 sats in low 80s on 8 liters O2. Patient instructed in PLB, which he seems to have difficulty performing. Patient moves well, able to stand and walk around bed with supervision to cga. He is limited by low O2 sats this session. Unable to get above 85 until bumped O2 up to 11 liters. RN aware. He will continue to benefit from skilled PT to improve endurance and safety.       If plan is discharge home, recommend the following: A little help with bathing/dressing/bathroom;A little help with walking and/or transfers;Assist for transportation;Supervision due to cognitive status;Help with stairs or ramp for entrance   Can travel by private vehicle     Yes  Equipment Recommendations  None recommended by PT    Recommendations for Other Services       Precautions / Restrictions Precautions Precautions: Fall Recall of Precautions/Restrictions: Impaired Precaution/Restrictions Comments: watch sats Restrictions Weight Bearing Restrictions Per Provider Order: No     Mobility  Bed Mobility Overal bed mobility: Needs Assistance Bed Mobility: Sit to Supine       Sit to supine: Mod assist, HOB elevated   General bed mobility comments: Mod A to get LEs into bed and for positioning, bringing shoulders over    Transfers Overall transfer level: Modified independent Equipment used: None Transfers: Sit to/from Stand Sit to Stand: Modified independent (Device/Increase  time)                Ambulation/Gait Ambulation/Gait assistance: Contact guard assist Gait Distance (Feet): 12 Feet Assistive device: None Gait Pattern/deviations: Step-through pattern, Decreased step length - right, Decreased stance time - right, Drifts right/left Gait velocity: decreased     General Gait Details: patient able to ambulate around bed with cga. Limited by low O2 sats this session, low 80s on 8 liters.   Stairs             Wheelchair Mobility     Tilt Bed    Modified Rankin (Stroke Patients Only)       Balance Overall balance assessment: Needs assistance Sitting-balance support: Feet supported Sitting balance-Leahy Scale: Normal     Standing balance support: No upper extremity supported, During functional activity Standing balance-Leahy Scale: Fair                              Hotel manager: No apparent difficulties  Cognition Arousal: Alert Behavior During Therapy: Flat affect   PT - Cognitive impairments: History of cognitive impairments, No family/caregiver present to determine baseline                       PT - Cognition Comments: Poor awareness of breathing, ability to take deep breaths when cued. Following commands: Impaired Following commands impaired: Follows multi-step commands with increased time, Follows one step commands with increased time    Cueing Cueing Techniques: Verbal cues  Exercises  General Comments        Pertinent Vitals/Pain Pain Assessment Pain Assessment: No/denies pain    Home Living                          Prior Function            PT Goals (current goals can now be found in the care plan section) Acute Rehab PT Goals Patient Stated Goal: home PT Goal Formulation: Patient unable to participate in goal setting Time For Goal Achievement: 09/08/23 Potential to Achieve Goals: Fair Additional Goals Additional Goal #1: Patient  will tolerate mobility with VSS on O2 as needed. Progress towards PT goals: Progressing toward goals    Frequency    Min 2X/week      PT Plan      Co-evaluation              AM-PAC PT "6 Clicks" Mobility   Outcome Measure  Help needed turning from your back to your side while in a flat bed without using bedrails?: A Little Help needed moving from lying on your back to sitting on the side of a flat bed without using bedrails?: A Lot Help needed moving to and from a bed to a chair (including a wheelchair)?: A Little Help needed standing up from a chair using your arms (e.g., wheelchair or bedside chair)?: A Little Help needed to walk in hospital room?: A Little Help needed climbing 3-5 steps with a railing? : Total 6 Click Score: 15    End of Session Equipment Utilized During Treatment: Oxygen Activity Tolerance: Treatment limited secondary to medical complications (Comment) Patient left: with call bell/phone within reach;in bed;with bed alarm set Nurse Communication: Mobility status;Other (comment) (low O2 sats) PT Visit Diagnosis: Other abnormalities of gait and mobility (R26.89);Unsteadiness on feet (R26.81);Muscle weakness (generalized) (M62.81)     Time: 1610-9604 PT Time Calculation (min) (ACUTE ONLY): 18 min  Charges:    $Gait Training: 8-22 mins PT General Charges $$ ACUTE PT VISIT: 1 Visit                     Ameirah Khatoon, PT, GCS 09/04/23,1:18 PM

## 2023-09-04 NOTE — Progress Notes (Signed)
 Mobility Specialist Progress Note:   09/04/23 1145  Mobility  Activity Ambulated with assistance in room  Level of Assistance Contact guard assist, steadying assist  Assistive Device None  Distance Ambulated (ft) 12 ft  Activity Response Tolerated fair  Mobility Referral Yes  Mobility visit 1 Mobility  Mobility Specialist Start Time (ACUTE ONLY) 1145  Mobility Specialist Stop Time (ACUTE ONLY) 1200  Mobility Specialist Time Calculation (min) (ACUTE ONLY) 15 min   Pt agreeable to mobility session. Distance limited d/t low sats. SpO2 84% on 10L while ambulating around bed to chair. Unable to safely further distance. Recovered to 92% on 8LO2 sitting in chair. Alarm on.  Oneda Big Mobility Specialist Please contact via SecureChat or  Rehab office at [phone]

## 2023-09-04 NOTE — Progress Notes (Addendum)
 PROGRESS NOTE    Bradley Mcclure  NWG:[id number] DOB: 1960-04-27 DOA: 08/24/2023 PCP: [name]  Chief Complaint  Patient presents with   Choking    Brief Narrative:   63 yo  with hx PE, T2DM, hx MRSA bacteremia, hx dysphagia and cricopharyngeal narrowing and multiple other medical issues who presented after an aspiration event while eating Bradley Mcclure sandwich at work.   Assessment & Plan:   Principal Problem:   Aspiration into airway Active Problems:   Uncontrolled type 2 diabetes mellitus with hyperglycemia, with long-term current use of insulin  (HCC)   SIRS (systemic inflammatory response syndrome) (HCC)   Oropharyngeal dysphagia   Acute hypoxic respiratory failure (HCC)   Aspiration pneumonitis (HCC)  Aspiration Pneumonia  Multifocal Pneumonia Acute Hypoxic Respiratory Failure Suspected ARDS CXR 5/8 with mild patchy and ill defined nodular densities in the mid right lung and left mid and lower lung (needs repeat cxr in 4-6 weeks to ensure resolution) Last fever 5/13 CXR 5/13 with improving L lung pneumonia and persistent R upper lobe pneumonia  CT chest 5/15 with multifocal pneumonia  CXR 5/18 with R and progressive L sided multifocal pneumonia Continued significant O2 needs, on 8 L HFNC today Pulmonary concerned for ARDS, recommending continue vanc and IV diuresis.  Steroids.  S/p 7 days coverage for aspiration pneumonia with unasyn /augmentin , will continue vanc (5/14-21).  Notably positive MRSA PCR. Urine strep - negative, urine legionella negative.  Sputum cx if able to collect.   Dysphagia SLP eval, appreciate assistance - presumed osteophytes projecting into the pharynx and preventing epiglottic inversion with liquids  CT with prominent anteriorly oriented disc osteophytes at C2-3 result in mild impression upon posterior hypopharynx Per discussion with SLP, osteophytes prevent epiglottic inversion with liquids - risk for aspiration Not clear if anything to  do, will discuss with nsgy (no surgical indication/recommendation) continue current diet with use of chin tuck to prevent penetration.   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 Severe bilateral neuroforaminal narrowing at C5-6 and more moderate bilateral neuroforaminal narrowing at C6-7.   Outpatient nsgy follow up   T2DM At home on metformin , semaglutide Adjust basal/bolus regimen prn Resistant SSI with steroids - adjust basal bolus regimen as needed  Sinus Tach Follow on his chronic metop   Hx PE  Xarelto    Depression Abilify , prozac   Cognitive Impairment Unclear baseline    DVT prophylaxis: xarelto  Code Status: full Family Communication: none - called listed friend, mark brody, for collateral/update, wrong number - will follow  Disposition:   Status is: Inpatient Remains inpatient appropriate because: need for ongoing inpatient care with hypoxia   Consultants:  none  Procedures:  Echo IMPRESSIONS     1. Left ventricular ejection fraction, by estimation, is 60 to 65%. The  left ventricle has normal function. The left ventricle has no regional  wall motion abnormalities. There is mild left ventricular hypertrophy of  the basal-septal segment. Left  ventricular diastolic parameters were normal.   2. Right ventricular systolic function is normal. The right ventricular  size is normal. Tricuspid regurgitation signal is inadequate  for assessing  PA pressure.   3. The mitral valve is normal in structure. No evidence of mitral valve   regurgitation. No evidence of mitral stenosis.   4. The aortic valve is tricuspid. Aortic valve regurgitation is not  visualized. Aortic valve sclerosis/calcification is present, without any  evidence of aortic stenosis. Aortic valve Vmax measures 1.48 m/s.   Antimicrobials:  Anti-infectives (From admission, onward)    Start     Dose/Rate Route Frequency Ordered Stop   09/03/23 0200  vancomycin  (VANCOREADY) IVPB 1500 mg/300 mL        1,500 mg 150 mL/hr over 120 Minutes Intravenous 2 times daily 09/03/23 0121     08/31/23 0700  vancomycin  (VANCOREADY) IVPB 750 mg/150 mL  Status:  Discontinued        750 mg 150 mL/hr over 60 Minutes Intravenous Every 12 hours 08/30/23 1837 09/03/23 0121   08/30/23 1930  vancomycin  (VANCOREADY) IVPB 2000 mg/400 mL        2,000 mg 200 mL/hr over 120 Minutes Intravenous  Once 08/30/23 1836 08/30/23 2202   08/29/23 1000  amoxicillin -clavulanate (AUGMENTIN ) 875-125 MG per tablet 1 tablet        1 tablet Oral Every 12 hours 08/29/23 0836 08/31/23 2118   08/25/23 1000  Ampicillin -Sulbactam (UNASYN ) 3 g in sodium chloride  0.9 % 100 mL IVPB  Status:  Discontinued        3 g 200 mL/hr over 30 Minutes Intravenous Every 6 hours 08/25/23 0859 08/29/23 0836       Subjective: Denies complaint  Objective: Vitals:   09/04/23 0829 09/04/23 1034 09/04/23 1310 09/04/23 1545  BP: 112/61 134/87  (!) 97/55  Pulse: 91 98  86  Resp:  18  20  Temp:  97.9 F (36.6 C)  97.9 F (36.6 C)  TempSrc:  Oral  Oral  SpO2:  90% (!) 83% 96%  Weight:      Height:        Intake/Output Summary (Last 24 hours) at 09/04/2023 1600 Last data filed at 09/04/2023 1305 Gross per 24 hour  Intake 1494 ml  Output 3450 ml  Net -1956 ml   Filed Weights   09/02/23 0346 09/03/23 0429 09/04/23 0349  Weight: 99.6 kg 99.8 kg 101.9 kg    Examination:  General: No acute distress. Cardiovascular: Heart sounds show Bradley Mcclure regular rate, and rhythm.  Lungs: Clear to auscultation bilaterally   Abdomen: Soft, nontender, nondistended  Neurological: Alert and oriented 3. Moves all extremities 4 with equal strength. Cranial nerves II through XII grossly intact. Extremities: No clubbing or cyanosis. No edema.   Data Reviewed: I have personally reviewed following labs and imaging studies  CBC: Recent Labs  Lab 08/31/23 0333 09/01/23 0419 09/02/23 0235 09/03/23 0259 09/04/23 0326  WBC 8.8 9.6 7.3 7.5 7.4  NEUTROABS 6.4 7.4 5.4 5.3 4.6  HGB 13.5 13.3 13.9 13.7 13.3  HCT 39.8 39.3 40.7 40.8 39.2  MCV 92.8 92.3 91.9 92.9 92.7  PLT 179 197 212 204 228    Basic Metabolic Panel: Recent Labs  Lab 08/31/23 0333 09/01/23 0419 09/02/23 0235 09/03/23 0259 09/04/23 0326  NA 135 134* 133* 132* 132*  K 3.9 3.6 3.6 3.2* 3.7  CL 95* 94* 92* 92* 94*  CO2 31 32 30 31 30   GLUCOSE 252* 280* 208* 176* 170*  BUN 15 17 14 12 8   CREATININE 0.64 0.63 0.59* 0.55* 0.55*  CALCIUM 8.8* 8.9 8.7* 8.6* 8.5*  MG 1.8 1.6* 2.0 1.7 1.7  PHOS 3.1 2.7 2.7 2.7 2.3*    GFR: Estimated Creatinine Clearance: 123.2 mL/min (Annalaya Wile) (by C-G formula based on SCr of 0.55 mg/dL (L)).  Liver Function Tests: Recent Labs  Lab 08/31/23 0333 09/01/23 0419 09/02/23 0235 09/03/23 0259 09/04/23 0326  AST 34 35 32 39 24  ALT 83* 71* 66* 63* 50*  ALKPHOS 79 80 76 74 69  BILITOT 0.9 1.2 1.1 0.8 1.0  PROT 6.4* 6.7 6.9 6.6 6.4*  ALBUMIN 2.5* 2.5* 2.6* 2.5* 2.5*    CBG: Recent Labs  Lab 09/03/23 1213 09/03/23 1612 09/03/23 2102 09/04/23 0601 09/04/23 1036  GLUCAP 240* 220* 282* 147* 325*     No results found for this or any previous visit (from the past 240 hours).        Radiology Studies: DG CHEST PORT 1 VIEW Result Date: 09/03/2023 CLINICAL DATA:  Shortness of breath.  History of aspiration EXAM: PORTABLE CHEST 1 VIEW COMPARISON:  08/29/2023 FINDINGS: Patient rotated to the right. Normal heart size. No pleural effusion or pneumothorax. Interstitial and airspace disease again identified  bilaterally. Relatively similar in the right upper lobe and right lung base. Felt to be slightly increased in the left mid and upper lung. IMPRESSION: Similar right and progressive left-sided multifocal pneumonia. Electronically Signed   By: Lore Rode M.D.   On: 09/03/2023 12:44        Scheduled Meds:  ARIPiprazole   2 mg Oral Daily   furosemide   40 mg Intravenous BID   insulin  aspart  0-15 Units Subcutaneous TID WC   insulin  aspart  0-5 Units Subcutaneous QHS   insulin  aspart  7 Units Subcutaneous TID WC   insulin  glargine-yfgn  30 Units Subcutaneous Daily   magnesium  oxide  400 mg Oral Daily   methylPREDNISolone  (SOLU-MEDROL ) injection  125 mg Intravenous Daily   metoprolol  tartrate  25 mg Oral BID   pantoprazole   40 mg Oral Daily   polyethylene glycol  17 g Oral BID   rivaroxaban   20 mg Oral Q supper   senna  1 tablet Oral QHS   Continuous Infusions:  vancomycin  1,500 mg (09/04/23 0858)     LOS: 11 days    Time spent: over 30 min     Donnetta Gains, MD [facility]   To contact the attending provider between 7A-7P or the covering provider during after hours 7P-7A, please log into the web site [email or web address] and access using universal [place] password for that web site. If you do not have the password, please call the hospital operator.  09/04/2023, 4:00 PM

## 2023-09-04 NOTE — Inpatient Diabetes Management (Signed)
 Inpatient Diabetes Program Recommendations  AACE/ADA: New Consensus Statement on Inpatient Glycemic Control (2015)  Target Ranges:  Prepandial:   less than 140 mg/dL      Peak postprandial:   less than 180 mg/dL (1-2 hours)      Critically ill patients:  140 - 180 mg/dL   Lab Results  Component Value Date   GLUCAP 325 (H) 09/04/2023   HGBA1C 8.9 (H) 08/25/2023    Review of Glycemic Control  Latest Reference Range & Units 09/03/23 05:46 09/03/23 12:13 09/03/23 16:12 09/03/23 21:02 09/04/23 06:01 09/04/23 10:36  Glucose-Capillary 70 - 99 mg/dL 161 (H) 096 (H) 045 (H) 282 (H) 147 (H) 325 (H)  (H): Data is abnormally high  Diabetes history: DM 2 Outpatient Diabetes medications:  Metformin  1000 mg bid Semaglutide 2 mg weekly (Patient states he also takes Lantus  18 units daily?) Current orders for Inpatient glycemic control:  Novolog  0-15 units tid with meals and HS Semglee  30 units daily Novolog  7 units tid with meals  Solumedrol 125 mg QD  Inpatient Diabetes Program Recommendations:    Might consider increasing meal coverage:  Novolog  10 units TID with meals if he consumes at least 50%.  Thank you, Hays Lipschutz, MSN, CDCES Diabetes Coordinator Inpatient Diabetes Program [phone] (team pager from 8a-5p)'

## 2023-09-04 NOTE — TOC Progression Note (Signed)
 Transition of Care [facility]) - Progression Note    Patient Details  Name: Bradley Mcclure MRN: [id number] Date of Birth: May 27, 1960  [facility] CM/SW Contact  Arron Big, [place] Phone Number: 09/04/2023, 3:32 PM  Clinical Narrative:   Patient refused SNF and HH.   If patient changes his mind, barriers to SNF is patients O2 status (7L O2) and he is not able to be weened down per bedside RN in progression.    TOC will continue to follow as needed.    Expected Discharge Plan: Home/Self Care Barriers to Discharge: Continued Medical Work up  Expected Discharge Plan and Services In-house Referral: Clinical Social Work     Living arrangements for the past 2 months: Apartment                                       Social Determinants of Health (SDOH) Interventions SDOH Screenings   Food Insecurity: No Food Insecurity (08/25/2023)  Housing: Low Risk  (08/25/2023)  Transportation Needs: No Transportation Needs (08/25/2023)  Utilities: Not At Risk (08/25/2023)  Tobacco Use: Low Risk  (08/26/2023)    Readmission Risk Interventions     No data to display

## 2023-09-04 NOTE — Consult Note (Signed)
 NAME:  Bradley Mcclure, MRN:  [id number], DOB:  [birth year], LOS: 11 ADMISSION DATE:  08/24/2023, CONSULTATION DATE:  09/04/23 REFERRING MD:  TRH, CHIEF COMPLAINT:  low O2   History of Present Illness:  63 year old man admitted with pneumonia from presumed aspiration event whom we are seeing for ongoing hypoxemic respiratory failure.  Multiple prior progress notes reviewed.  H&P reviewed.  Presented to the hospital after choking on the roast beef sandwich.  Began coughing.  Chest imaging, chest x-ray revealed multifocal infiltrates as well as CTA PE protocol showing the same no PE on my review interpretation.  He had worsening hypoxemia felt to be related to fluids.  He has been diuresed.  He was initially on community-acquired pneumonia coverage.  White blood cell count trended down.  He had 1 fever a few days later.  Given lack of improvement he was changed to vancomycin  a few days ago.  No improvement in hypoxemia.  He desaturates with movement and coughing.  Sometimes takes nasal cannula out of his nose.  It seems decreased saturations have not totally correlated with sleeping.  Had repeat chest x-ray 5/18 shows similar multifocal infiltrates particularly worse on the left on my review interpretation.  History from the patient is very difficult.  He has clear cognitive impairment.  He states overall he feels better.  Discussed at length with bedside RN who took care of patient yesterday.  Desaturate significantly with movement.  Randomly at rest.  Sometimes takes nasal cannula.  Not consistently when he sleeps.  Pertinent  Medical History  Cognitive impairment, diabetes  Significant Hospital Events: Including procedures, antibiotic start and stop dates in addition to other pertinent events   Admit to the hospital 08/24/2023 for pneumonia, multifocal infiltrates on chest x-ray and CT scan on my review and interpretation 5/19 PCCM consult for ongoing hypoxemia not improving despite antibiotics and  diuresis  Interim History / Subjective:    Objective    Blood pressure 112/61, pulse 91, temperature 97.9 F (36.6 C), temperature source Oral, resp. rate 20, height 6\' 1"  (1.854 m), weight 101.9 kg, SpO2 94%.        Intake/Output Summary (Last 24 hours) at 09/04/2023 0949 Last data filed at 09/04/2023 4098 Gross per 24 hour  Intake 1200 ml  Output 2350 ml  Net -1150 ml   Filed Weights   09/02/23 0346 09/03/23 0429 09/04/23 0349  Weight: 99.6 kg 99.8 kg 101.9 kg    Examination: General: Lying down and sits up in bed, in no distress HENT: Atraumatic normocephalic no icterus Lungs: Diminished but clear throughout, normal work of breathing on 8 L nasal cannula Cardiovascular: Borderline tachycardic, regular rhythm Abdomen: Nondistended Extremities: No edema noted Neuro: He is alert, somewhat oriented, when asked higher-level questions he seems to struggle to answer coherently, slow to respond   Resolved problem list   Assessment and Plan   Acute hypoxemic respiratory failure in the setting of aspiration pneumonia/pneumonitis and likely ARDS: Bilateral scattered groundglass opacities on admission on CT scan.  Persistent on chest x-ray.  No real improvement hypoxemia.  Significant desaturation with exertion.  Suspect this is related to significant ARDS.  Unfortunately approaching 2 weeks, in the row where fibrosis can start occurring and these changes could become permanent.  Patient's been aggressively diuresed and aggressively treated with antibiotics without significant improvement.  He seems to have cognitive impairment, do wonder about recurrent aspiration but more likely I fear this is ongoing lung injury from the initial event. -- Continue  vancomycin , recommend completing 7-day course, completed previous course of CAP coverage earlier in admission -- Continue aggressive IV diuresis as you have -- Start steroids, IV Solu-Medrol  125 mg daily x 3 days, suspect hyperglycemia will  worsen will likely need titration of insulin  via primary team -- Offer CPAP at night in case element of sleep apnea, given his cognitive impairment I think this will be difficult to achieve  PCCM will continue to follow Best Practice (right click and "Reselect all SmartList Selections" daily)   Per primary  Labs   CBC: Recent Labs  Lab 08/31/23 0333 09/01/23 0419 09/02/23 0235 09/03/23 0259 09/04/23 0326  WBC 8.8 9.6 7.3 7.5 7.4  NEUTROABS 6.4 7.4 5.4 5.3 4.6  HGB 13.5 13.3 13.9 13.7 13.3  HCT 39.8 39.3 40.7 40.8 39.2  MCV 92.8 92.3 91.9 92.9 92.7  PLT 179 197 212 204 228    Basic Metabolic Panel: Recent Labs  Lab 08/31/23 0333 09/01/23 0419 09/02/23 0235 09/03/23 0259 09/04/23 0326  NA 135 134* 133* 132* 132*  K 3.9 3.6 3.6 3.2* 3.7  CL 95* 94* 92* 92* 94*  CO2 31 32 30 31 30   GLUCOSE 252* 280* 208* 176* 170*  BUN 15 17 14 12 8   CREATININE 0.64 0.63 0.59* 0.55* 0.55*  CALCIUM 8.8* 8.9 8.7* 8.6* 8.5*  MG 1.8 1.6* 2.0 1.7 1.7  PHOS 3.1 2.7 2.7 2.7 2.3*   GFR: Estimated Creatinine Clearance: 123.2 mL/min (A) (by C-G formula based on SCr of 0.55 mg/dL (L)). Recent Labs  Lab 09/01/23 0419 09/02/23 0235 09/03/23 0256 09/03/23 0259 09/04/23 0326  PROCALCITON  --   --  <0.10  --  <0.10  WBC 9.6 7.3  --  7.5 7.4    Liver Function Tests: Recent Labs  Lab 08/31/23 0333 09/01/23 0419 09/02/23 0235 09/03/23 0259 09/04/23 0326  AST 34 35 32 39 24  ALT 83* 71* 66* 63* 50*  ALKPHOS 79 80 76 74 69  BILITOT 0.9 1.2 1.1 0.8 1.0  PROT 6.4* 6.7 6.9 6.6 6.4*  ALBUMIN 2.5* 2.5* 2.6* 2.5* 2.5*   No results for input(s): "LIPASE", "AMYLASE" in the last 168 hours. No results for input(s): "AMMONIA" in the last 168 hours.  ABG    Component Value Date/Time   PHART 7.48 (H) 08/27/2023 1057   PCO2ART 41 08/27/2023 1057   PO2ART 76 (L) 08/27/2023 1057   HCO3 29.8 (H) 08/27/2023 1057   TCO2 22 08/24/2023 1751   ACIDBASEDEF 2.0 08/24/2023 1751   O2SAT 98.1  08/27/2023 1057     Coagulation Profile: No results for input(s): "INR", "PROTIME" in the last 168 hours.  Cardiac Enzymes: No results for input(s): "CKTOTAL", "CKMB", "CKMBINDEX", "TROPONINI" in the last 168 hours.  HbA1C: Hgb A1c MFr Bld  Date/Time Value Ref Range Status  08/25/2023 05:51 AM 8.9 (H) 4.8 - 5.6 % Final    Comment:    (NOTE) Pre diabetes:          5.7%-6.4%  Diabetes:              >6.4%  Glycemic control for   <7.0% adults with diabetes   09/06/2022 05:05 AM 6.8 (H) 4.8 - 5.6 % Final    Comment:    (NOTE) Pre diabetes:          5.7%-6.4%  Diabetes:              >6.4%  Glycemic control for   <7.0% adults with diabetes     CBG:  Recent Labs  Lab 09/03/23 0546 09/03/23 1213 09/03/23 1612 09/03/23 2102 09/04/23 0601  GLUCAP 186* 240* 220* 282* 147*    Review of Systems:   Unable to obtain due to perceived cognitive deficits from patient  Past Medical History:  He,  has a past medical history of Balanitis (12/24/2015), Cellulitis and abscess (12/24/2015), Generalized anxiety disorder (09/06/2022), Hyperlipidemia (09/06/2022), Hypertension, IBS (irritable bowel syndrome), Major depressive disorder (09/06/2022), Mild cognitive disorder (09/06/2022), MRSA bacteremia (12/24/2015), Non-alcoholic fatty liver disease (21/30/8657), Pneumonia (2014-2015 X 1), Pulmonary embolism (HCC) (03/21/2013), Sepsis (HCC) (11/11/2015), and Type II diabetes mellitus (HCC).   Surgical History:   Past Surgical History:  Procedure Laterality Date   TEE WITHOUT CARDIOVERSION N/A 11/17/2015   Procedure: TRANSESOPHAGEAL ECHOCARDIOGRAM (TEE);  Surgeon: Maudine Sos, MD;  Location[facility] ENDOSCOPY;  Service: Cardiovascular;  Laterality: N/A;   TONSILLECTOMY  ~ 1977     Social History:   reports that he has never smoked. He has never used smokeless tobacco. He reports that he does not drink alcohol and does not use drugs.   Family History:  His family history includes  Diabetes type II in his brother; Myasthenia gravis in his mother.   Allergies Allergies  Allergen Reactions   Empagliflozin  Other (See Comments)    Acidosis     Home Medications  Prior to Admission medications   Medication Sig Start Date End Date Taking? Authorizing Provider  acetaminophen  (TYLENOL ) 325 MG tablet Take 650 mg by mouth every 6 (six) hours as needed for mild pain.    Yes [provider]  ARIPiprazole  (ABILIFY ) 2 MG tablet Take 2 mg by mouth daily. 05/11/22  Yes [provider]  cyanocobalamin (VITAMIN B12) 500 MCG tablet Take 500 mcg by mouth daily. 08/11/23  Yes [provider]  FLUoxetine  (PROZAC ) 20 MG capsule Take 20 mg by mouth 2 (two) times daily as needed (anxiety/depression). 02/02/22  Yes [provider]  losartan  (COZAAR ) 50 MG tablet Take 50 mg by mouth daily.   Yes [provider]  metFORMIN  (GLUCOPHAGE ) 500 MG tablet Take 1,000 mg by mouth 2 (two) times daily with a meal.    Yes [provider]  metoprolol  tartrate (LOPRESSOR ) 25 MG tablet Take 1 tablet (25 mg total) by mouth 2 (two) times daily. 09/15/22  Yes Macdonald Savoy, MD  mupirocin  ointment (BACTROBAN ) 2 % Place 1 application into the nose 2 (two) times daily. 12/24/15  Yes Ernie Heal, Jerelyn Money, MD  polyethylene glycol (MIRALAX  / GLYCOLAX ) packet Take 17 g by mouth daily. 03/02/16  Yes Leonette Ramal, MD  rivaroxaban  (XARELTO ) 20 MG TABS tablet Take 20 mg by mouth daily with supper.   Yes [provider]  rosuvastatin (CRESTOR) 20 MG tablet Take 10 mg by mouth daily. 08/23/23  Yes [provider]  Semaglutide, 2 MG/DOSE, 8 MG/3ML SOPN Inject 2 mg into the skin every 7 (seven) days. 08/29/22  Yes [provider]     Critical care time: n/a    Guerry Leek, MD See [facility]

## 2023-09-04 NOTE — Progress Notes (Signed)
 PT Cancellation Note  Patient Details Name: Bradley Mcclure MRN: [id number] DOB: Nov 29, 1960   Cancelled Treatment:    Reason Eval/Treat Not Completed: Fatigue/lethargy limiting ability to participate Patient very lethargic upon entering room. Asks that I return later. Will re-attempt this pm.    Barnett Elzey 09/04/2023, 10:16 AM

## 2023-09-05 DIAGNOSIS — T17908A Unspecified foreign body in respiratory tract, part unspecified causing other injury, initial encounter: Secondary | ICD-10-CM | POA: Diagnosis not present

## 2023-09-05 DIAGNOSIS — J9601 Acute respiratory failure with hypoxia: Secondary | ICD-10-CM | POA: Diagnosis not present

## 2023-09-05 DIAGNOSIS — J690 Pneumonitis due to inhalation of food and vomit: Secondary | ICD-10-CM | POA: Diagnosis not present

## 2023-09-05 LAB — COMPREHENSIVE METABOLIC PANEL WITH GFR
ALT: 42 U/L (ref 0–44)
AST: 24 U/L (ref 15–41)
Albumin: 2.8 g/dL — ABNORMAL LOW (ref 3.5–5.0)
Alkaline Phosphatase: 72 U/L (ref 38–126)
Anion gap: 9 (ref 5–15)
BUN: 15 mg/dL (ref 6–20)
CO2: 30 mmol/L (ref 22–32)
Calcium: 9.1 mg/dL (ref 8.9–10.3)
Chloride: 93 mmol/L — ABNORMAL LOW (ref 98–111)
Creatinine, Ser: 0.67 mg/dL (ref 0.61–1.24)
GFR, Estimated: >60 mL/min (ref >60)
Glucose, Bld: 302 mg/dL — ABNORMAL HIGH (ref 70–99)
Potassium: 4 mmol/L (ref 3.5–5.1)
Sodium: 132 mmol/L — ABNORMAL LOW (ref 135–145)
Total Bilirubin: 0.9 mg/dL (ref 0.0–1.2)
Total Protein: 7.1 g/dL (ref 6.5–8.1)

## 2023-09-05 LAB — MAGNESIUM: Magnesium: 1.9 mg/dL (ref 1.7–2.4)

## 2023-09-05 LAB — CBC WITH DIFFERENTIAL/PLATELET
Abs Immature Granulocytes: 0.09 10*3/uL — ABNORMAL HIGH (ref 0.00–0.07)
Basophils Absolute: 0 10*3/uL (ref 0.0–0.1)
Basophils Relative: 0 %
Eosinophils Absolute: 0 10*3/uL (ref 0.0–0.5)
Eosinophils Relative: 0 %
HCT: 41.4 % (ref 39.0–52.0)
Hemoglobin: 14 g/dL (ref 13.0–17.0)
Immature Granulocytes: 1 %
Lymphocytes Relative: 13 %
Lymphs Abs: 2.2 10*3/uL (ref 0.7–4.0)
MCH: 31.2 pg (ref 26.0–34.0)
MCHC: 33.8 g/dL (ref 30.0–36.0)
MCV: 92.2 fL (ref 80.0–100.0)
Monocytes Absolute: 1 10*3/uL (ref 0.1–1.0)
Monocytes Relative: 6 %
Neutro Abs: 13.4 10*3/uL — ABNORMAL HIGH (ref 1.7–7.7)
Neutrophils Relative %: 80 %
Platelets: 306 10*3/uL (ref 150–400)
RBC: 4.49 MIL/uL (ref 4.22–5.81)
RDW: 12.6 % (ref 11.5–15.5)
WBC: 16.8 10*3/uL — ABNORMAL HIGH (ref 4.0–10.5)
nRBC: 0 % (ref 0.0–0.2)

## 2023-09-05 LAB — GLUCOSE, CAPILLARY
Glucose-Capillary: 276 mg/dL — ABNORMAL HIGH (ref 70–99)
Glucose-Capillary: 299 mg/dL — ABNORMAL HIGH (ref 70–99)
Glucose-Capillary: 347 mg/dL — ABNORMAL HIGH (ref 70–99)
Glucose-Capillary: 299 mg/dL — ABNORMAL HIGH (ref 70–99)

## 2023-09-05 LAB — VANCOMYCIN, TROUGH: Vancomycin Tr: 40 ug/mL (ref 15–20)

## 2023-09-05 LAB — PHOSPHORUS: Phosphorus: 3 mg/dL (ref 2.5–4.6)

## 2023-09-05 MED ORDER — INSULIN ASPART 100 UNIT/ML IJ SOLN
10.0000 [IU] | Freq: Three times a day (TID) | INTRAMUSCULAR | Status: DC
  Administered 2023-09-05 – 2023-09-07 (×6): 10 [IU] via SUBCUTANEOUS

## 2023-09-05 MED ORDER — INSULIN GLARGINE-YFGN 100 UNIT/ML ~~LOC~~ SOLN
35.0000 [IU] | Freq: Every day | SUBCUTANEOUS | Status: DC
  Administered 2023-09-06 – 2023-09-14 (×9): 35 [IU] via SUBCUTANEOUS
  Filled 2023-09-05 (×9): qty 0.35

## 2023-09-05 MED ORDER — METHYLPREDNISOLONE SODIUM SUCC 125 MG IJ SOLR
125.0000 mg | Freq: Every day | INTRAMUSCULAR | Status: AC
  Administered 2023-09-06: 125 mg via INTRAVENOUS
  Filled 2023-09-05: qty 2

## 2023-09-05 MED ORDER — INSULIN GLARGINE-YFGN 100 UNIT/ML ~~LOC~~ SOLN
5.0000 [IU] | Freq: Once | SUBCUTANEOUS | Status: AC
  Administered 2023-09-05: 5 [IU] via SUBCUTANEOUS
  Filled 2023-09-05: qty 0.05

## 2023-09-05 MED ORDER — PREDNISONE 20 MG PO TABS
20.0000 mg | ORAL_TABLET | Freq: Every day | ORAL | Status: DC
  Administered 2023-09-14: 20 mg via ORAL
  Filled 2023-09-05: qty 1

## 2023-09-05 MED ORDER — PREDNISONE 20 MG PO TABS
40.0000 mg | ORAL_TABLET | Freq: Every day | ORAL | Status: AC
  Administered 2023-09-07 – 2023-09-13 (×7): 40 mg via ORAL
  Filled 2023-09-05 (×7): qty 2

## 2023-09-05 MED ORDER — PREDNISONE 10 MG PO TABS: 10.0000 mg | ORAL_TABLET | Freq: Every day | ORAL | Status: DC

## 2023-09-05 NOTE — Progress Notes (Signed)
 Pharmacy Antibiotic Note  Vancomycin  trough drawn while vancomycin  infusing> erroneous value - repeat 5/21 am  Bradley Mcclure is a 63 y.o. male admitted on 08/24/2023 with pneumonia.  Pharmacy has been consulted for vancomycin  dosing.  Vancomycin  peak tonight subtherapeutic  Plan:  Vancomycin  1500 mg IV q12h  Height: 6\' 1"  (185.4 cm) Weight: 98.9 kg (218 lb 0.6 oz) IBW/kg (Calculated) : 79.9  Temp (24hrs), Avg:98 F (36.7 C), Min:97.4 F (36.3 C), Max:98.9 F (37.2 C)  Recent Labs  Lab 09/01/23 0419 09/02/23 0235 09/02/23 2127 09/03/23 0259 09/04/23 0326 09/05/23 1110  WBC 9.6 7.3  --  7.5 7.4 16.8*  CREATININE 0.63 0.59*  --  0.55* 0.55* 0.67  VANCOTROUGH  --   --   --   --   --  40*  VANCOPEAK  --   --  9*  --   --   --     Estimated Creatinine Clearance: 121.5 mL/min (by C-G formula based on SCr of 0.67 mg/dL).    Allergies  Allergen Reactions   Empagliflozin  Other (See Comments)    Acidosis    Antimicrobials this admission: Vanc 5/14>  Dose adjustments this admission:   Microbiology results: 5/9 MRSA pos 5/8 RVP neg   Hortensia Ma Pharm.D. CPP, BCPS Clinical Pharmacist [phone] 09/05/2023 1:52 PM

## 2023-09-05 NOTE — Progress Notes (Signed)
 Mobility Specialist Progress Note:   09/05/23 1120  Mobility  Activity Ambulated with assistance in hallway  Level of Assistance Contact guard assist, steadying assist  Assistive Device Other (Comment) (IV pole)  Distance Ambulated (ft) 150 ft  Activity Response Tolerated well  Mobility Referral Yes  Mobility visit 1 Mobility  Mobility Specialist Start Time (ACUTE ONLY) 1120  Mobility Specialist Stop Time (ACUTE ONLY) 1140  Mobility Specialist Time Calculation (min) (ACUTE ONLY) 20 min   Pt agreeable to mobility session. Required only CGA for safety. Maintained SpO2 WFL for ~137ft on 60ft, however desat to 85% and unable to recover. Further mobility deferred. Pt coached through PLB, however unable to tolerate well d/t cough. Left sitting in chair with all needs met, alarm on. SpO2 93% on 9LO2.    Oneda Big Mobility Specialist Please contact via SecureChat or  Rehab office at [phone]

## 2023-09-05 NOTE — Progress Notes (Addendum)
 PROGRESS NOTE    Bradley Mcclure  AVW:[id number] DOB: 11/12/1960 DOA: 08/24/2023 PCP: [name]  Chief Complaint  Patient presents with   Choking    Brief Narrative:   63 yo  with hx PE, T2DM, hx MRSA bacteremia, hx dysphagia and cricopharyngeal narrowing and multiple other medical issues who presented after an aspiration event while eating [employer] sandwich at work.   Assessment & Plan:   Principal Problem:   Aspiration into airway Active Problems:   Uncontrolled type 2 diabetes mellitus with hyperglycemia, with long-term current use of insulin  (HCC)   SIRS (systemic inflammatory response syndrome) (HCC)   Oropharyngeal dysphagia   Acute hypoxic respiratory failure (HCC)   Aspiration pneumonitis (HCC)  Aspiration Pneumonia  Multifocal Pneumonia Acute Hypoxic Respiratory Failure Suspected ARDS CXR 5/8 with mild patchy and ill defined nodular densities in the mid right lung and left mid and lower lung (needs repeat cxr in 4-6 weeks to ensure resolution) Last fever 5/13 CXR 5/13 with improving L lung pneumonia and persistent R upper lobe pneumonia  CT chest 5/15 with multifocal pneumonia  CXR 5/18 with R and progressive L sided multifocal pneumonia Continued significant O2 needs, on 9 L HFNC today Pulmonary concerned for ARDS, recommending continue vanc and IV diuresis.  Steroids (taper).  PCCM signed off 5/20, available prn.  S/p 7 days coverage for aspiration pneumonia with unasyn /augmentin , will continue vanc (5/14-21).  Trough high today, drawn when vanc infusing -> follow per pharmacy.  Notably positive MRSA PCR. Urine strep - negative, urine legionella negative.  Sputum cx if able to collect.   Volume Overload Hypoxia worsened on 5/11, thought due to pneumonia and iatrogenic fluid overload Currently on lasix  40 mg IV BID Follow echo (EF 60-65%, no RWMA) Net negative 15.6 L  Strict I/O, daily weights  Dysphagia SLP eval, appreciate assistance - presumed  osteophytes projecting into the pharynx and preventing epiglottic inversion with liquids  CT with prominent anteriorly oriented disc osteophytes at C2-3 result in mild impression upon posterior hypopharynx Per discussion with SLP, osteophytes prevent epiglottic inversion with liquids - risk for aspiration Not clear if anything to do, will discuss with nsgy (no surgical indication/recommendation) Regular diet with compensatory strategies  Esophagitis Noted on CT imaging   Abnormal C Spine CT 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 Severe bilateral neuroforaminal narrowing at C5-6 and more moderate bilateral neuroforaminal narrowing at C6-7.   Outpatient nsgy follow up   T2DM At home on metformin , semaglutide Adjust basal/bolus regimen prn Resistant SSI with steroids - adjust basal bolus regimen as needed  Sinus Tach Follow on his chronic metop   Hx PE  Xarelto    Depression Abilify , prozac   Cognitive Impairment Unclear baseline - tried to call his contact for collateral, but wrong number.     DVT prophylaxis: xarelto  Code Status: full Family Communication: has listed contact, Ronnette Coke, but wrong number.  When I ask if there's anyone I can call today, he says no.  Disposition:   Status is: Inpatient Remains inpatient appropriate because: need for ongoing inpatient care with hypoxia   Consultants:  none  Procedures:  Echo IMPRESSIONS     1. Left ventricular ejection fraction, by estimation, is 60 to 65%. The  left ventricle has normal function. The left ventricle has no regional  wall motion abnormalities. There is mild left ventricular hypertrophy  of  the basal-septal segment. Left  ventricular diastolic parameters were normal.   2. Right ventricular systolic function is normal. The right ventricular  size  is normal. Tricuspid regurgitation signal is inadequate for assessing  PA pressure.   3. The mitral valve is normal in structure. No evidence of mitral valve  regurgitation. No evidence of mitral stenosis.   4. The aortic valve is tricuspid. Aortic valve regurgitation is not  visualized. Aortic valve sclerosis/calcification is present, without any  evidence of aortic stenosis. Aortic valve Vmax measures 1.48 m/s.   Antimicrobials:  Anti-infectives (From admission, onward)    Start     Dose/Rate Route Frequency Ordered Stop   09/03/23 0200  vancomycin  (VANCOREADY) IVPB 1500 mg/300 mL        1,500 mg 150 mL/hr over 120 Minutes Intravenous 2 times daily 09/03/23 0121     08/31/23 0700  vancomycin  (VANCOREADY) IVPB 750 mg/150 mL  Status:  Discontinued        750 mg 150 mL/hr over 60 Minutes Intravenous Every 12 hours 08/30/23 1837 09/03/23 0121   08/30/23 1930  vancomycin  (VANCOREADY) IVPB 2000 mg/400 mL        2,000 mg 200 mL/hr over 120 Minutes Intravenous  Once 08/30/23 1836 08/30/23 2202   08/29/23 1000  amoxicillin -clavulanate (AUGMENTIN ) 875-125 MG per tablet 1 tablet        1 tablet Oral Every 12 hours 08/29/23 0836 08/31/23 2118   08/25/23 1000  Ampicillin -Sulbactam (UNASYN ) 3 g in sodium chloride  0.9 % 100 mL IVPB  Status:  Discontinued        3 g 200 mL/hr over 30 Minutes Intravenous Every 6 hours 08/25/23 0859 08/29/23 0836       Subjective: No complaints  Objective: Vitals:   09/05/23 0551 09/05/23 0751 09/05/23 1137 09/05/23 1537  BP: 122/62 122/68 (!) 107/58 116/62  Pulse: 80 89 84 87  Resp: 20 20 17 20   Temp: 97.6 F (36.4 C) 97.9 F (36.6 C) (!) 97.4 F (36.3 C) 98.1 F (36.7 C)  TempSrc: Oral Oral Oral Oral  SpO2: 96% 97% 97% 93%  Weight: 98.9 kg     Height:        Intake/Output Summary (Last 24 hours) at 09/05/2023 1805 Last data filed at 09/05/2023 1539 Gross per 24 hour  Intake 1080 ml  Output 2625 ml  Net -1545 ml   Filed Weights   09/03/23  0429 09/04/23 0349 09/05/23 0551  Weight: 99.8 kg 101.9 kg 98.9 kg    Examination:  General: No acute distress. Cardiovascular: RRR Lungs: Clear to auscultation bilaterally, unlabored - on 9 L [place] Abdomen: Soft, nontender, nondistended  Neurological: Alert and oriented 3. Moves all extremities 4 with equal strength. Cranial nerves II through XII grossly intact. Extremities: No clubbing or cyanosis. No edema.   Data Reviewed: I have personally reviewed following labs and imaging studies  CBC: Recent Labs  Lab 09/01/23 0419 09/02/23 0235 09/03/23 0259 09/04/23 0326 09/05/23 1110  WBC 9.6 7.3 7.5 7.4 16.8*  NEUTROABS 7.4 5.4 5.3 4.6 13.4*  HGB 13.3 13.9 13.7 13.3 14.0  HCT 39.3 40.7 40.8 39.2 41.4  MCV 92.3 91.9 92.9 92.7 92.2  PLT 197 212 204 228 306    Basic Metabolic Panel: Recent Labs  Lab 09/01/23 0419 09/02/23 0235 09/03/23 0259 09/04/23 0326 09/05/23 1110  NA 134* 133* 132* 132* 132*  K 3.6 3.6 3.2* 3.7 4.0  CL 94* 92* 92* 94* 93*  CO2 32 30 31 30  30  GLUCOSE 280* 208* 176* 170* 302*  BUN 17 14 12 8 15   CREATININE 0.63 0.59* 0.55* 0.55* 0.67  CALCIUM 8.9 8.7* 8.6* 8.5* 9.1  MG 1.6* 2.0 1.7 1.7 1.9  PHOS 2.7 2.7 2.7 2.3* 3.0    GFR: Estimated Creatinine Clearance: 121.5 mL/min (by C-G formula based on SCr of 0.67 mg/dL).  Liver Function Tests: Recent Labs  Lab 09/01/23 0419 09/02/23 0235 09/03/23 0259 09/04/23 0326 09/05/23 1110  AST 35 32 39 24 24  ALT 71* 66* 63* 50* 42  ALKPHOS 80 76 74 69 72  BILITOT 1.2 1.1 0.8 1.0 0.9  PROT 6.7 6.9 6.6 6.4* 7.1  ALBUMIN 2.5* 2.6* 2.5* 2.5* 2.8*    CBG: Recent Labs  Lab 09/04/23 1544 09/04/23 2106 09/05/23 0643 09/05/23 1124 09/05/23 1534  GLUCAP 177* 356* 276* 299* 347*     No results found for this or any previous visit (from the past 240 hours).        Radiology Studies: No results found.       Scheduled Meds:  ARIPiprazole   2 mg Oral Daily   furosemide   40 mg Intravenous  BID   insulin  aspart  0-20 Units Subcutaneous TID WC   insulin  aspart  0-5 Units Subcutaneous QHS   insulin  aspart  10 Units Subcutaneous TID WC   insulin  glargine-yfgn  30 Units Subcutaneous Daily   magnesium  oxide  400 mg Oral Daily   methylPREDNISolone  (SOLU-MEDROL ) injection  125 mg Intravenous Daily   metoprolol  tartrate  25 mg Oral BID   pantoprazole   40 mg Oral Daily   polyethylene glycol  17 g Oral BID   rivaroxaban   20 mg Oral Q supper   senna  1 tablet Oral QHS   Continuous Infusions:  vancomycin  1,500 mg (09/05/23 1014)     LOS: 12 days    Time spent: over 30 min     Donnetta Gains, MD [facility]   To contact the attending provider between 7A-7P or the covering provider during after hours 7P-7A, please log into the web site [email or web address] and access using universal [place] password for that web site. If you do not have the password, please call the hospital operator.  09/05/2023, 6:05 PM

## 2023-09-05 NOTE — Progress Notes (Signed)
 NAME:  Bradley Mcclure, MRN:  [id number], DOB:  23-Apr-1960, LOS: 12 ADMISSION DATE:  08/24/2023, CONSULTATION DATE:  09/05/23 REFERRING MD:  TRH, CHIEF COMPLAINT:  low O2   History of Present Illness:  63 year old man admitted with pneumonia from presumed aspiration event whom we are seeing for ongoing hypoxemic respiratory failure.  Multiple prior progress notes reviewed.  H&P reviewed.  Presented to the hospital after choking on the roast beef sandwich.  Began coughing.  Chest imaging, chest x-ray revealed multifocal infiltrates as well as CTA PE protocol showing the same no PE on my review interpretation.  He had worsening hypoxemia felt to be related to fluids.  He has been diuresed.  He was initially on community-acquired pneumonia coverage.  White blood cell count trended down.  He had 1 fever a few days later.  Given lack of improvement he was changed to vancomycin  a few days ago.  No improvement in hypoxemia.  He desaturates with movement and coughing.  Sometimes takes nasal cannula out of his nose.  It seems decreased saturations have not totally correlated with sleeping.  Had repeat chest x-ray 5/18 shows similar multifocal infiltrates particularly worse on the left on my review interpretation.  History from the patient is very difficult.  He has clear cognitive impairment.  He states overall he feels better.  Discussed at length with bedside RN who took care of patient yesterday.  Desaturate significantly with movement.  Randomly at rest.  Sometimes takes nasal cannula.  Not consistently when he sleeps.  Pertinent  Medical History  Cognitive impairment, diabetes  Significant Hospital Events: Including procedures, antibiotic start and stop dates in addition to other pertinent events   Admit to the hospital 08/24/2023 for pneumonia, multifocal infiltrates on chest x-ray and CT scan on my review and interpretation 5/19 PCCM consult for ongoing hypoxemia not improving despite antibiotics and  diuresis  Interim History / Subjective:  No change in O2 - he states feels the same  Objective    Blood pressure 122/68, pulse 89, temperature 97.9 F (36.6 C), temperature source Oral, resp. rate 20, height 6\' 1"  (1.854 m), weight 98.9 kg, SpO2 97%.        Intake/Output Summary (Last 24 hours) at 09/05/2023 1000 Last data filed at 09/05/2023 0840 Gross per 24 hour  Intake 1494 ml  Output 3225 ml  Net -1731 ml   Filed Weights   09/03/23 0429 09/04/23 0349 09/05/23 0551  Weight: 99.8 kg 101.9 kg 98.9 kg    Examination: General: Lying completely supine on back 0 degree angle, in no distress HENT: Atraumatic normocephalic no icterus Lungs: Diminished but clear throughout, normal work of breathing on 8 L nasal cannula Cardiovascular: Borderline tachycardic, regular rhythm Abdomen: Nondistended Extremities: No edema noted Neuro: He is alert, somewhat oriented, when asked higher-level questions he seems to struggle to answer coherently, slow to respond   Resolved problem list   Assessment and Plan   Acute hypoxemic respiratory failure in the setting of aspiration pneumonia/pneumonitis and likely ARDS: Bilateral scattered groundglass opacities on admission on CT scan.  Persistent on chest x-ray.  No real improvement hypoxemia.  Significant desaturation with exertion.  Suspect this is related to significant ARDS.  Unfortunately approaching 2 weeks, in the row where fibrosis can start occurring and these changes could become permanent.  Patient's been aggressively diuresed and aggressively treated with antibiotics without significant improvement.  He seems to have cognitive impairment, do wonder about recurrent aspiration especially when lying supine but more  likely I fear this is ongoing lung injury from the initial event. -- Continue vancomycin , recommend completing 7-day course, completed previous course of CAP coverage earlier in admission -- Continue aggressive IV diuresis as you  have -- Start steroids, IV Solu-Medrol  125 mg daily x 3 days (5/19-5/21), then decrease to 40 mg x 7 days then 20 mg for 7 days then stop then 10 mg for 7 days then stop -- suspect hyperglycemia will worsen will likely need titration of insulin  via primary team -- Offer CPAP at night in case element of sleep apnea, given his cognitive impairment I think this will be difficult to achieve  PCCM is available as needed, please contact us  if we can help, no further recommendations at this time  Best Practice (right click and "Reselect all SmartList Selections" daily)   Per primary  Labs   CBC: Recent Labs  Lab 08/31/23 0333 09/01/23 0419 09/02/23 0235 09/03/23 0259 09/04/23 0326  WBC 8.8 9.6 7.3 7.5 7.4  NEUTROABS 6.4 7.4 5.4 5.3 4.6  HGB 13.5 13.3 13.9 13.7 13.3  HCT 39.8 39.3 40.7 40.8 39.2  MCV 92.8 92.3 91.9 92.9 92.7  PLT 179 197 212 204 228    Basic Metabolic Panel: Recent Labs  Lab 08/31/23 0333 09/01/23 0419 09/02/23 0235 09/03/23 0259 09/04/23 0326  NA 135 134* 133* 132* 132*  K 3.9 3.6 3.6 3.2* 3.7  CL 95* 94* 92* 92* 94*  CO2 31 32 30 31 30   GLUCOSE 252* 280* 208* 176* 170*  BUN 15 17 14 12 8   CREATININE 0.64 0.63 0.59* 0.55* 0.55*  CALCIUM 8.8* 8.9 8.7* 8.6* 8.5*  MG 1.8 1.6* 2.0 1.7 1.7  PHOS 3.1 2.7 2.7 2.7 2.3*   GFR: Estimated Creatinine Clearance: 121.5 mL/min (A) (by C-G formula based on SCr of 0.55 mg/dL (L)). Recent Labs  Lab 09/01/23 0419 09/02/23 0235 09/03/23 0256 09/03/23 0259 09/04/23 0326  PROCALCITON  --   --  <0.10  --  <0.10  WBC 9.6 7.3  --  7.5 7.4    Liver Function Tests: Recent Labs  Lab 08/31/23 0333 09/01/23 0419 09/02/23 0235 09/03/23 0259 09/04/23 0326  AST 34 35 32 39 24  ALT 83* 71* 66* 63* 50*  ALKPHOS 79 80 76 74 69  BILITOT 0.9 1.2 1.1 0.8 1.0  PROT 6.4* 6.7 6.9 6.6 6.4*  ALBUMIN 2.5* 2.5* 2.6* 2.5* 2.5*   No results for input(s): "LIPASE", "AMYLASE" in the last 168 hours. No results for input(s):  "AMMONIA" in the last 168 hours.  ABG    Component Value Date/Time   PHART 7.48 (H) 08/27/2023 1057   PCO2ART 41 08/27/2023 1057   PO2ART 76 (L) 08/27/2023 1057   HCO3 29.8 (H) 08/27/2023 1057   TCO2 22 08/24/2023 1751   ACIDBASEDEF 2.0 08/24/2023 1751   O2SAT 98.1 08/27/2023 1057     Coagulation Profile: No results for input(s): "INR", "PROTIME" in the last 168 hours.  Cardiac Enzymes: No results for input(s): "CKTOTAL", "CKMB", "CKMBINDEX", "TROPONINI" in the last 168 hours.  HbA1C: Hgb A1c MFr Bld  Date/Time Value Ref Range Status  08/25/2023 05:51 AM 8.9 (H) 4.8 - 5.6 % Final    Comment:    (NOTE) Pre diabetes:          5.7%-6.4%  Diabetes:              >6.4%  Glycemic control for   <7.0% adults with diabetes   09/06/2022 05:05 AM 6.8 (H) 4.8 -  5.6 % Final    Comment:    (NOTE) Pre diabetes:          5.7%-6.4%  Diabetes:              >6.4%  Glycemic control for   <7.0% adults with diabetes     CBG: Recent Labs  Lab 09/04/23 0601 09/04/23 1036 09/04/23 1544 09/04/23 2106 09/05/23 0643  GLUCAP 147* 325* 177* 356* 276*    Review of Systems:   Unable to obtain due to perceived cognitive deficits from patient  Past Medical History:  He,  has a past medical history of Balanitis (12/24/2015), Cellulitis and abscess (12/24/2015), Generalized anxiety disorder (09/06/2022), Hyperlipidemia (09/06/2022), Hypertension, IBS (irritable bowel syndrome), Major depressive disorder (09/06/2022), Mild cognitive disorder (09/06/2022), MRSA bacteremia (12/24/2015), Non-alcoholic fatty liver disease (16/01/9603), Pneumonia (2014-2015 X 1), Pulmonary embolism (HCC) (03/21/2013), Sepsis (HCC) (11/11/2015), and Type II diabetes mellitus (HCC).   Surgical History:   Past Surgical History:  Procedure Laterality Date   TEE WITHOUT CARDIOVERSION N/A 11/17/2015   Procedure: TRANSESOPHAGEAL ECHOCARDIOGRAM (TEE);  Surgeon: Maudine Sos, MD;  Location[facility] ENDOSCOPY;  Service:  Cardiovascular;  Laterality: N/A;   TONSILLECTOMY  ~ 1977     Social History:   reports that he has never smoked. He has never used smokeless tobacco. He reports that he does not drink alcohol and does not use drugs.   Family History:  His family history includes Diabetes type II in his brother; Myasthenia gravis in his mother.   Allergies Allergies  Allergen Reactions   Empagliflozin  Other (See Comments)    Acidosis     Home Medications  Prior to Admission medications   Medication Sig Start Date End Date Taking? Authorizing Provider  acetaminophen  (TYLENOL ) 325 MG tablet Take 650 mg by mouth every 6 (six) hours as needed for mild pain.    Yes [provider]  ARIPiprazole  (ABILIFY ) 2 MG tablet Take 2 mg by mouth daily. 05/11/22  Yes [provider]  cyanocobalamin (VITAMIN B12) 500 MCG tablet Take 500 mcg by mouth daily. 08/11/23  Yes [provider]  FLUoxetine  (PROZAC ) 20 MG capsule Take 20 mg by mouth 2 (two) times daily as needed (anxiety/depression). 02/02/22  Yes [provider]  losartan  (COZAAR ) 50 MG tablet Take 50 mg by mouth daily.   Yes [provider]  metFORMIN  (GLUCOPHAGE ) 500 MG tablet Take 1,000 mg by mouth 2 (two) times daily with a meal.    Yes [provider]  metoprolol  tartrate (LOPRESSOR ) 25 MG tablet Take 1 tablet (25 mg total) by mouth 2 (two) times daily. 09/15/22  Yes Macdonald Savoy, MD  mupirocin  ointment (BACTROBAN ) 2 % Place 1 application into the nose 2 (two) times daily. 12/24/15  Yes Ernie Heal, Jerelyn Money, MD  polyethylene glycol (MIRALAX  / GLYCOLAX ) packet Take 17 g by mouth daily. 03/02/16  Yes Leonette Ramal, MD  rivaroxaban  (XARELTO ) 20 MG TABS tablet Take 20 mg by mouth daily with supper.   Yes [provider]  rosuvastatin (CRESTOR) 20 MG tablet Take 10 mg by mouth daily. 08/23/23  Yes [provider]  Semaglutide, 2 MG/DOSE, 8 MG/3ML SOPN Inject 2 mg into the skin every 7  (seven) days. 08/29/22  Yes [provider]     Critical care time: n/a    Guerry Leek, MD See [facility]

## 2023-09-05 NOTE — Plan of Care (Signed)

## 2023-09-05 NOTE — Inpatient Diabetes Management (Signed)
 Inpatient Diabetes Program Recommendations  AACE/ADA: New Consensus Statement on Inpatient Glycemic Control (2015)  Target Ranges:  Prepandial:   less than 140 mg/dL      Peak postprandial:   less than 180 mg/dL (1-2 hours)      Critically ill patients:  140 - 180 mg/dL   Lab Results  Component Value Date   GLUCAP 299 (H) 09/05/2023   HGBA1C 8.9 (H) 08/25/2023    Review of Glycemic Control  Latest Reference Range & Units 09/04/23 06:01 09/04/23 10:36 09/04/23 15:44 09/04/23 21:06 09/05/23 06:43 09/05/23 11:24  Glucose-Capillary 70 - 99 mg/dL 409 (H) 811 (H) 914 (H) 356 (H) 276 (H) 299 (H)  (H): Data is abnormally high  Diabetes history: DM 2 Outpatient Diabetes medications:  Metformin  1000 mg bid Semaglutide 2 mg weekly (Patient states he also takes Lantus  18 units daily?) Current orders for Inpatient glycemic control:  Novolog  0-20 units tid with meals and HS Semglee  30 units daily Novolog  7 units tid with meals  Solumedrol 125 mg QD   Inpatient Diabetes Program Recommendations:    Noted correction was increased to resistant TID.  Might also consider:   Novolog  10 units TID with meals if he consumes at least 50%.   Thank you, Hays Lipschutz, MSN, CDCES Diabetes Coordinator Inpatient Diabetes Program [phone] (team pager from 8a-5p)'

## 2023-09-06 DIAGNOSIS — T17908A Unspecified foreign body in respiratory tract, part unspecified causing other injury, initial encounter: Secondary | ICD-10-CM | POA: Diagnosis not present

## 2023-09-06 DIAGNOSIS — J9601 Acute respiratory failure with hypoxia: Secondary | ICD-10-CM | POA: Diagnosis not present

## 2023-09-06 DIAGNOSIS — J690 Pneumonitis due to inhalation of food and vomit: Secondary | ICD-10-CM | POA: Diagnosis not present

## 2023-09-06 LAB — CBC WITH DIFFERENTIAL/PLATELET
Abs Immature Granulocytes: 0.07 10*3/uL (ref 0.00–0.07)
Basophils Absolute: 0 10*3/uL (ref 0.0–0.1)
Basophils Relative: 0 %
Eosinophils Absolute: 0 10*3/uL (ref 0.0–0.5)
Eosinophils Relative: 0 %
HCT: 41.3 % (ref 39.0–52.0)
Hemoglobin: 14 g/dL (ref 13.0–17.0)
Immature Granulocytes: 0 %
Lymphocytes Relative: 14 %
Lymphs Abs: 2.2 10*3/uL (ref 0.7–4.0)
MCH: 31.5 pg (ref 26.0–34.0)
MCHC: 33.9 g/dL (ref 30.0–36.0)
MCV: 93 fL (ref 80.0–100.0)
Monocytes Absolute: 0.9 10*3/uL (ref 0.1–1.0)
Monocytes Relative: 6 %
Neutro Abs: 12.4 10*3/uL — ABNORMAL HIGH (ref 1.7–7.7)
Neutrophils Relative %: 80 %
Platelets: 260 10*3/uL (ref 150–400)
RBC: 4.44 MIL/uL (ref 4.22–5.81)
RDW: 12.6 % (ref 11.5–15.5)
WBC: 15.6 10*3/uL — ABNORMAL HIGH (ref 4.0–10.5)
nRBC: 0 % (ref 0.0–0.2)

## 2023-09-06 LAB — GLUCOSE, CAPILLARY
Glucose-Capillary: 265 mg/dL — ABNORMAL HIGH (ref 70–99)
Glucose-Capillary: 201 mg/dL — ABNORMAL HIGH (ref 70–99)
Glucose-Capillary: 340 mg/dL — ABNORMAL HIGH (ref 70–99)
Glucose-Capillary: 349 mg/dL — ABNORMAL HIGH (ref 70–99)

## 2023-09-06 LAB — VANCOMYCIN, TROUGH: Vancomycin Tr: 13 ug/mL — ABNORMAL LOW (ref 15–20)

## 2023-09-06 LAB — COMPREHENSIVE METABOLIC PANEL WITH GFR
ALT: 39 U/L (ref 0–44)
AST: 23 U/L (ref 15–41)
Albumin: 2.8 g/dL — ABNORMAL LOW (ref 3.5–5.0)
Alkaline Phosphatase: 62 U/L (ref 38–126)
Anion gap: 10 (ref 5–15)
BUN: 15 mg/dL (ref 6–20)
CO2: 29 mmol/L (ref 22–32)
Calcium: 9.1 mg/dL (ref 8.9–10.3)
Chloride: 94 mmol/L — ABNORMAL LOW (ref 98–111)
Creatinine, Ser: 0.6 mg/dL — ABNORMAL LOW (ref 0.61–1.24)
GFR, Estimated: >60 mL/min (ref >60)
Glucose, Bld: 248 mg/dL — ABNORMAL HIGH (ref 70–99)
Potassium: 4.1 mmol/L (ref 3.5–5.1)
Sodium: 133 mmol/L — ABNORMAL LOW (ref 135–145)
Total Bilirubin: 0.9 mg/dL (ref 0.0–1.2)
Total Protein: 6.7 g/dL (ref 6.5–8.1)

## 2023-09-06 LAB — PHOSPHORUS: Phosphorus: 3.1 mg/dL (ref 2.5–4.6)

## 2023-09-06 LAB — MAGNESIUM: Magnesium: 2 mg/dL (ref 1.7–2.4)

## 2023-09-06 NOTE — TOC Progression Note (Signed)
 Transition of Care [facility]) - Progression Note    Patient Details  Name: Bradley Mcclure MRN: [id number] Date of Birth: 01-06-1961  Transition of Care [facility] CM/SW Contact  Arron Big, LCSWA Phone Number: 09/06/2023, 11:00 AM  Clinical Narrative:   Patient not medically ready for DC. Remains on 7L O2.   TOC will continue to follow.    Expected Discharge Plan: Home/Self Care Barriers to Discharge: Continued Medical Work up  Expected Discharge Plan and Services In-house Referral: Clinical Social Work     Living arrangements for the past 2 months: Apartment                                       Social Determinants of Health (SDOH) Interventions SDOH Screenings   Food Insecurity: No Food Insecurity (08/25/2023)  Housing: Low Risk  (08/25/2023)  Transportation Needs: No Transportation Needs (08/25/2023)  Utilities: Not At Risk (08/25/2023)  Tobacco Use: Low Risk  (08/26/2023)    Readmission Risk Interventions     No data to display

## 2023-09-06 NOTE — Progress Notes (Signed)
 Pharmacy Antibiotic Note   Bradley Mcclure is a 63 y.o. male admitted on 08/24/2023 with pneumonia.  Pharmacy has been consulted for vancomycin  dosing.  Vancomycin  trough this morning is slightly below goal at 13.  Per MD notes, today is last day of vancomycin  - dose due now  Plan: Continue Vancomycin  1500 mg IV q12h x 1-2 more doses.  Height: 6\' 1"  (185.4 cm) Weight: 99.2 kg (218 lb 11.1 oz) IBW/kg (Calculated) : 79.9  Temp (24hrs), Avg:97.7 F (36.5 C), Min:97.4 F (36.3 C), Max:98.1 F (36.7 C)  Recent Labs  Lab 09/02/23 0235 09/02/23 2127 09/03/23 0259 09/04/23 0326 09/05/23 1110 09/06/23 0306 09/06/23 0753  WBC 7.3  --  7.5 7.4 16.8* 15.6*  --   CREATININE 0.59*  --  0.55* 0.55* 0.67 0.60*  --   VANCOTROUGH  --   --   --   --  40*  --  13*  VANCOPEAK  --  9*  --   --   --   --   --     Estimated Creatinine Clearance: 121.7 mL/min (A) (by C-G formula based on SCr of 0.6 mg/dL (L)).    Allergies  Allergen Reactions   Empagliflozin  Other (See Comments)    Acidosis    Antimicrobials this admission: Vanc 5/14>  Dose adjustments this admission:   Microbiology results: 5/9 MRSA pos 5/8 RVP neg   Joanell Mowers, Davey Erp, [facility] Clinical Pharmacist  09/06/2023 9:41 AM   [facility] pharmacy phone numbers are listed on [email or web address]

## 2023-09-06 NOTE — Progress Notes (Signed)
 Physical Therapy Treatment Patient Details Name: Bradley Mcclure MRN: [id number] DOB: 12-Oct-1960 Today's Date: 09/06/2023   History of Present Illness Patient is a 63 y/o male admitted 08/24/23 with aspiration when choking on roast beef sandwich.  Pt needing 4L O2 with SpO2 85% and HR 120's, RR 20's.  PMH positive for h/o esophageal dysphagia, cognitive impairment, GERD, DM2, PE on anticoagulation, NAFLD, depression, HTN and HLD.    PT Comments  Patient making steady progress with mobility. Continues to take extra time to process cues and repeated cues required for sequencing. Pt completed bed mob at mod ind level and CGA/supervision for safety with sit<>stand transfers. Pt ambulated ~200' with IV pole for support and CGA for safety. Pt on 10L/min at start of ambulation and desat to 84% requiring 15L/min to recover to 91% with mobility. At rest pt maintained 90% on 7L/min (pt on 7L/min at start of session). Pt preferring to return to supine in bed. Alarm on and call bell within reach. Will continue to progress as able during acute stay. Patient will benefit from continued inpatient follow up therapy, <3 hours/day.   If plan is discharge home, recommend the following: A little help with bathing/dressing/bathroom;A little help with walking and/or transfers;Assist for transportation;Supervision due to cognitive status;Help with stairs or ramp for entrance   Can travel by private vehicle     Yes  Equipment Recommendations  None recommended by PT    Recommendations for Other Services       Precautions / Restrictions Precautions Precautions: Fall Recall of Precautions/Restrictions: Impaired Precaution/Restrictions Comments: watch sats Restrictions Weight Bearing Restrictions Per Provider Order: No     Mobility  Bed Mobility Overal bed mobility: Needs Assistance Bed Mobility: Sit to Supine       Sit to supine: Contact guard assist, HOB elevated, Used rails   General bed mobility  comments: pt seated EOB at start. CGA for return to supine.    Transfers Overall transfer level: Needs assistance Equipment used: None Transfers: Sit to/from Stand Sit to Stand: Supervision           General transfer comment: sup for safety with sit<>stand, heady reliance on UE use to power up    Ambulation/Gait Ambulation/Gait assistance: Contact guard assist Gait Distance (Feet): 200 Feet Assistive device: IV Pole, None Gait Pattern/deviations: Step-through pattern, Decreased step length - right, Decreased stance time - right, Drifts right/left Gait velocity: decr     General Gait Details: mild unsteadiness and drift to Lt. no overt LOB.   Stairs             Wheelchair Mobility     Tilt Bed    Modified Rankin (Stroke Patients Only)       Balance Overall balance assessment: Needs assistance Sitting-balance support: Feet supported Sitting balance-Leahy Scale: Normal     Standing balance support: No upper extremity supported, During functional activity Standing balance-Leahy Scale: Fair                              Hotel manager: No apparent difficulties  Cognition Arousal: Alert Behavior During Therapy: Flat affect   PT - Cognitive impairments: History of cognitive impairments, No family/caregiver present to determine baseline                       PT - Cognition Comments: Poor awareness of breathing, ability to take deep breaths when cued. Following commands: Impaired Following  commands impaired: Follows multi-step commands with increased time, Follows one step commands with increased time    Cueing Cueing Techniques: Verbal cues  Exercises      General Comments        Pertinent Vitals/Pain Pain Assessment Pain Assessment: No/denies pain    Home Living                          Prior Function            PT Goals (current goals can now be found in the care plan section)  Acute Rehab PT Goals Patient Stated Goal: home PT Goal Formulation: Patient unable to participate in goal setting Time For Goal Achievement: 09/08/23 Potential to Achieve Goals: Fair Progress towards PT goals: Progressing toward goals    Frequency    Min 2X/week      PT Plan      Co-evaluation              AM-PAC PT "6 Clicks" Mobility   Outcome Measure  Help needed turning from your back to your side while in a flat bed without using bedrails?: A Little Help needed moving from lying on your back to sitting on the side of a flat bed without using bedrails?: A Little Help needed moving to and from a bed to a chair (including a wheelchair)?: A Little Help needed standing up from a chair using your arms (e.g., wheelchair or bedside chair)?: A Little Help needed to walk in hospital room?: A Little Help needed climbing 3-5 steps with a railing? : Total 6 Click Score: 16    End of Session Equipment Utilized During Treatment: Gait belt;Oxygen Activity Tolerance: Patient tolerated treatment well Patient left: in bed;with call bell/phone within reach;with bed alarm set Nurse Communication: Mobility status;Other (comment) PT Visit Diagnosis: Other abnormalities of gait and mobility (R26.89);Unsteadiness on feet (R26.81);Muscle weakness (generalized) (M62.81)     Time: 6045-4098 PT Time Calculation (min) (ACUTE ONLY): 24 min  Charges:    $Gait Training: 8-22 mins $Therapeutic Activity: 8-22 mins PT General Charges $$ ACUTE PT VISIT: 1 Visit                     Tish Forge, DPT Acute Rehabilitation Services Office [phone]  09/06/23 4:49 PM

## 2023-09-06 NOTE — Progress Notes (Signed)
 PROGRESS NOTE    BENTLY WYSS  ZOX:[id number] DOB: 1960-11-19 DOA: 08/24/2023 PCP: [name]    Brief Narrative:  63 year old gentleman with history of pulmonary embolism, type 2 diabetes, history of MRSA bacteremia, history of dysphagia and cricopharyngeal narrowing who presented to the hospital with an aspiration event while eating a sandwich at work. Patient initially treated for aspiration pneumonia, continues to require high-dose oxygen.  Remains in the hospital on high flow oxygen.  Seen by critical care.  Subjective: Patient seen and examined.  Poor historian.  Flat affect.  Denies any complaints. Assessment & Plan:   Aspiration pneumonia, multi focal pneumonia, acute hypoxemic respiratory failure and now suspected ARDS: And now suspected ARDS: Chest x-ray 5/8 with ill-defined nodular densities mid right lung and left mid  lower lung. Chest x-ray 5/13, improvement CT chest 5/15 with multifocal pneumonia Chest x-ray 5/18 with right-sided progressive multifocal pneumonia Patient was treated with broad-spectrum antibiotics with Unasyn  and vancomycin .  Completing vancomycin  today.  Blood cultures negative. Still remains on high flow oxygen. Keep on oxygen to keep saturation more than 90%.  Mobilize with PT OT. Suspected ARDS, treating with diuretics and steroids. Continue steroid taper. Continue Lasix  40 mg IV twice daily today. Echocardiogram with normal ejection fraction. Net -15 L. Seen by speech therapy.  Currently on regular diet.  Dysphagia and esophagitis: Seen by speech therapy. CT scan of the neck with osteophytes.  Osteophytes projecting to the posterior pharynx. Neurosurgery, no surgical recommendations.  History of PE: Therapeutic on Xarelto .  Depression: On Abilify  and Prozac .  Continue.  Cognitive impairment: Unclear baseline.  Speech therapy to evaluate for cognition.  Needs safety evaluation for discharge home.  No primary  contact.     DVT prophylaxis:  rivaroxaban  (XARELTO ) tablet 20 mg   Code Status: Full code Family Communication: None, Disposition Plan: Status is: Inpatient Remains inpatient appropriate because: High flow oxygen     Consultants:  Critical care  Procedures:  None  Antimicrobials:  Unasyn  vancomycin , completed     Objective: Vitals:   09/06/23 0500 09/06/23 0832 09/06/23 1027 09/06/23 1041  BP:  114/67 114/67 122/75  Pulse:  83 83 87  Resp:  19  20  Temp:  98.1 F (36.7 C)  97.7 F (36.5 C)  TempSrc:  Oral  Oral  SpO2:  99%  94%  Weight: 99.2 kg     Height:        Intake/Output Summary (Last 24 hours) at 09/06/2023 1249 Last data filed at 09/06/2023 1155 Gross per 24 hour  Intake 860 ml  Output 3750 ml  Net -2890 ml   Filed Weights   09/04/23 0349 09/05/23 0551 09/06/23 0500  Weight: 101.9 kg 98.9 kg 99.2 kg    Examination:  General exam: Appears calm and comfortable.  On 7 L oxygen.  Not in any distress.  Able to talk in complete sentences, however he is withdrawn and calm with flat affect. Respiratory system: Clear to auscultation. Respiratory effort normal.  No added sounds. Cardiovascular system: S1 & S2 heard, RRR.  Gastrointestinal system: Soft.  Nontender.  Bowel sounds positive. Central nervous system: Alert and awake.  Oriented to himself and situation. Extremities: Symmetric 5 x 5 power. Generalized weakness.    Data Reviewed: I have personally reviewed following labs and imaging studies  CBC: Recent Labs  Lab 09/02/23 0235 09/03/23 0259 09/04/23 0326 09/05/23 1110 09/06/23 0306  WBC 7.3 7.5 7.4 16.8* 15.6*  NEUTROABS 5.4 5.3 4.6 13.4* 12.4*  HGB 13.9 13.7 13.3 14.0 14.0  HCT 40.7 40.8 39.2 41.4 41.3  MCV 91.9 92.9 92.7 92.2 93.0  PLT 212 204 228 306 260   Basic Metabolic Panel: Recent Labs  Lab 09/02/23 0235 09/03/23 0259 09/04/23 0326 09/05/23 1110 09/06/23 0306  NA 133* 132* 132* 132* 133*  K 3.6 3.2* 3.7 4.0 4.1   CL 92* 92* 94* 93* 94*  CO2 30 31 30 30 29   GLUCOSE 208* 176* 170* 302* 248*  BUN 14 12 8 15 15   CREATININE 0.59* 0.55* 0.55* 0.67 0.60*  CALCIUM 8.7* 8.6* 8.5* 9.1 9.1  MG 2.0 1.7 1.7 1.9 2.0  PHOS 2.7 2.7 2.3* 3.0 3.1   GFR: Estimated Creatinine Clearance: 121.7 mL/min (A) (by C-G formula based on SCr of 0.6 mg/dL (L)). Liver Function Tests: Recent Labs  Lab 09/02/23 0235 09/03/23 0259 09/04/23 0326 09/05/23 1110 09/06/23 0306  AST 32 39 24 24 23   ALT 66* 63* 50* 42 39  ALKPHOS 76 74 69 72 62  BILITOT 1.1 0.8 1.0 0.9 0.9  PROT 6.9 6.6 6.4* 7.1 6.7  ALBUMIN 2.6* 2.5* 2.5* 2.8* 2.8*   No results for input(s): "LIPASE", "AMYLASE" in the last 168 hours. No results for input(s): "AMMONIA" in the last 168 hours. Coagulation Profile: No results for input(s): "INR", "PROTIME" in the last 168 hours. Cardiac Enzymes: No results for input(s): "CKTOTAL", "CKMB", "CKMBINDEX", "TROPONINI" in the last 168 hours. BNP (last 3 results) No results for input(s): "PROBNP" in the last 8760 hours. HbA1C: No results for input(s): "HGBA1C" in the last 72 hours. CBG: Recent Labs  Lab 09/05/23 1124 09/05/23 1534 09/05/23 2158 09/06/23 0611 09/06/23 1043  GLUCAP 299* 347* 299* 265* 201*   Lipid Profile: No results for input(s): "CHOL", "HDL", "LDLCALC", "TRIG", "CHOLHDL", "LDLDIRECT" in the last 72 hours. Thyroid Function Tests: No results for input(s): "TSH", "T4TOTAL", "FREET4", "T3FREE", "THYROIDAB" in the last 72 hours. Anemia Panel: No results for input(s): "VITAMINB12", "FOLATE", "FERRITIN", "TIBC", "IRON", "RETICCTPCT" in the last 72 hours. Sepsis Labs: Recent Labs  Lab 09/03/23 0256 09/04/23 0326  PROCALCITON <0.10 <0.10    No results found for this or any previous visit (from the past 240 hours).       Radiology Studies: No results found.      Scheduled Meds:  ARIPiprazole   2 mg Oral Daily   furosemide   40 mg Intravenous BID   insulin  aspart  0-20 Units  Subcutaneous TID WC   insulin  aspart  0-5 Units Subcutaneous QHS   insulin  aspart  10 Units Subcutaneous TID WC   insulin  glargine-yfgn  35 Units Subcutaneous Daily   magnesium  oxide  400 mg Oral Daily   metoprolol  tartrate  25 mg Oral BID   pantoprazole   40 mg Oral Daily   polyethylene glycol  17 g Oral BID   [START ON 09/07/2023] predniSONE  40 mg Oral Q breakfast   Followed by   Cecily Cohen ON 09/14/2023] predniSONE  20 mg Oral Q breakfast   Followed by   [START ON 09/21/2023] predniSONE  10 mg Oral Q breakfast   rivaroxaban   20 mg Oral Q supper   senna  1 tablet Oral QHS   Continuous Infusions:  vancomycin  1,500 mg (09/06/23 1036)     LOS: 13 days    Time spent: 40 minutes    Vada Garibaldi, MD [facility]

## 2023-09-06 NOTE — Progress Notes (Signed)
 Patient continues on 9L HFNC and desats to 70s when he takes off oxygen, which he does very often throughout shift. Patient did the same thing last night (09/04/23 night shift), takes off oxygen and would desat into 70s. Patient continuously reminded to keep oxygen in nose and educated on the necessity and benefits of oxygen at this time, patient still noncompliant.

## 2023-09-07 ENCOUNTER — Inpatient Hospital Stay (HOSPITAL_COMMUNITY)

## 2023-09-07 DIAGNOSIS — J9601 Acute respiratory failure with hypoxia: Secondary | ICD-10-CM | POA: Diagnosis not present

## 2023-09-07 DIAGNOSIS — T17908A Unspecified foreign body in respiratory tract, part unspecified causing other injury, initial encounter: Secondary | ICD-10-CM | POA: Diagnosis not present

## 2023-09-07 DIAGNOSIS — J690 Pneumonitis due to inhalation of food and vomit: Secondary | ICD-10-CM | POA: Diagnosis not present

## 2023-09-07 LAB — GLUCOSE, CAPILLARY
Glucose-Capillary: 284 mg/dL — ABNORMAL HIGH (ref 70–99)
Glucose-Capillary: 273 mg/dL — ABNORMAL HIGH (ref 70–99)
Glucose-Capillary: 298 mg/dL — ABNORMAL HIGH (ref 70–99)
Glucose-Capillary: 322 mg/dL — ABNORMAL HIGH (ref 70–99)

## 2023-09-07 MED ORDER — INSULIN ASPART 100 UNIT/ML IJ SOLN
13.0000 [IU] | Freq: Three times a day (TID) | INTRAMUSCULAR | Status: DC
  Administered 2023-09-07 – 2023-09-12 (×14): 13 [IU] via SUBCUTANEOUS

## 2023-09-07 NOTE — Plan of Care (Signed)
  Problem: Health Behavior/Discharge Planning: Goal: Ability to manage health-related needs will improve Outcome: Progressing   Problem: Clinical Measurements: Goal: Diagnostic test results will improve Outcome: Progressing   Problem: Coping: Goal: Level of anxiety will decrease Outcome: Progressing   

## 2023-09-07 NOTE — Progress Notes (Signed)
 Patient has excellent vasculature bilaterally. Please assess and attempt prior to placing IVT consult.  Twylla Arceneaux R Awais Cobarrubias, RN

## 2023-09-07 NOTE — Evaluation (Addendum)
 Speech Language Pathology Evaluation Patient Details Name: Bradley Mcclure MRN: [id number] DOB: Apr 01, 1961 Today's Date: 09/07/2023 Time: 0915-1010 SLP Time Calculation (min) (ACUTE ONLY): 55 min  Problem List:  Patient Active Problem List   Diagnosis Date Noted   Aspiration pneumonitis (HCC) 08/30/2023   Aspiration into airway 08/24/2023   SIRS (systemic inflammatory response syndrome) (HCC) 08/24/2023   Oropharyngeal dysphagia 08/24/2023   Acute hypoxic respiratory failure (HCC) 08/24/2023   Multifocal pneumonia 09/06/2022   Hyperlipidemia 09/06/2022   Generalized anxiety disorder 09/06/2022   Major depressive disorder 09/06/2022   Type 2 diabetes mellitus with diabetic neuropathy, unspecified (HCC) 09/06/2022   Mild cognitive disorder 09/06/2022   History of retinal detachment 09/06/2022   Essential hypertension 09/06/2022   Type 2 diabetes mellitus (HCC) 09/06/2022   Sepsis due to Staphylococcus aureus (HCC) 03/01/2016   Cellulitis of left lower extremity    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   Cellulitis and abscess 12/24/2015   Balanitis 12/24/2015   Uncontrolled type 2 diabetes mellitus with hyperglycemia, with long-term current use of insulin  (HCC)    Bacteremia    Morbid obesity due to excess calories (HCC)    Cellulitis of abdominal wall 11/11/2015   Sepsis (HCC) 11/11/2015   Rash 11/11/2015   Hypertension    Diabetes mellitus without complication (HCC)    Candida-induced panniculitis    GERD (gastroesophageal reflux disease) 04/19/2015   Pulmonary embolism (HCC) 03/21/2013   Non-alcoholic fatty liver disease 04/19/2011   Past Medical History:  Past Medical History:  Diagnosis Date   Balanitis 12/24/2015   Cellulitis and abscess 12/24/2015   Generalized anxiety disorder 09/06/2022   Hyperlipidemia 09/06/2022   Hypertension    IBS (irritable bowel syndrome)    Major depressive disorder 09/06/2022   Mild cognitive disorder 09/06/2022   Dec 07, 2016 Entered By:  Rosalita Combe Comment: Medical Disabilty 12.2017 -Jul 10, 2018 Entered By: Aloma Jaksch Comment: diagnosed at [facility]   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   Non-alcoholic fatty liver disease 04/19/2011   Dec 07, 2016 Entered By: Rosalita Combe Comment: Urged to modify lifestyle &amp; Urged again 2018 to begin Coffee 2-4/d for hepatoprotection   Pneumonia 2014-2015 X 1   Pulmonary embolism (HCC) 03/21/2013   Sepsis (HCC) 11/11/2015   Type II diabetes mellitus (HCC)    Past Surgical History:  Past Surgical History:  Procedure Laterality Date   TEE WITHOUT CARDIOVERSION N/A 11/17/2015   Procedure: TRANSESOPHAGEAL ECHOCARDIOGRAM (TEE);  Surgeon: Maudine Sos, MD;  Location: [facility] ENDOSCOPY;  Service: Cardiovascular;  Laterality: N/A;   TONSILLECTOMY  ~ 1977   HPI:  Bradley Mcclure is a 63 y.o. male veteran, with hx of esophageal dysphagia (? Prior cricopharyngeal narrowing) who presented after an episode of aspiration.  Reports that around 4 PM he was eating a roast beef sandwich and felt that it was moving slowly through his oropharynx, points at his upper neck.  He started "choking" and coughing.  Drank a soda which felt this helped clear the food and he started breathing better.  He denied any prior issues with dysphagia, but has a long documented [facility] esophageal dysphagia with?  Cricopharyngeal narrowing and 2017, notes from [facility] in 2024.  Pt also has a documented history of "cognitive impairment, though details are not given. PMH includes PE on anticoagulation, diabetes type 2, hypertension, hyperlipidemia, NAFLD, depression, cognitive impairment, GERD,   Assessment / Plan / Recommendation Clinical Impression  Pt demonstrates moderate cognitive impairment, unclear if  this is changed from his baseline level of function. Attempted to call friend listed in chart, but number was incorrect. Pt reports he lives alone, drives, works as a  Electrical engineer on night shift (has a key for the [employer] on his tray table). He has 'cognitive impairment' listed in his history. When asked, pt reports his memory has been bad. When asked what the consequences of that has been in his life, pt cant answer the question, too abstract. When asked if he has gotten trouble at work for being forgetful, pt says yes.   SLP administered the COGNISTAT, pt scored well in orientation, attention, constructional ability and  language. Pt performed poorly with large pieces of information such as multistep directions, digit repetition of 7 or more numbers, division and subtratction. Most concerning is decreased recall of any safety precautions he has been taught or information about his discharge. In a memory task, pt could recall words if there was repetition and word association. But he repeatedly has no memory of any trouble swallowing or strategies to improve swallowing, though he is admitted for a choking episode, and has had several of these episodes in the past. Given poor memory, he has no awareness of deficits and no ability to make good judgement about his care. Pt additionally has very flat affect, poor initiation. Is very passive.   Would predict, based on assessment, that without more intensive therapy, pt would not be capable of adequate self care in a fully independent setting. However, pt has a history of impairment and this may be his baseline. No family or friend is listed who can clarify. Would benefit from intermittent supervision or further investigation into his safety and self care prior to admit.    SLP Assessment  SLP Recommendation/Assessment: Patient needs continued Speech Lanaguage Pathology Services SLP Visit Diagnosis: Cognitive communication deficit (R41.841)    Recommendations for follow up therapy are one component of a multi-disciplinary discharge planning process, led by the attending physician.  Recommendations may be updated based  on patient status, additional functional criteria and insurance authorization.    Follow Up Recommendations  Follow physician's recommendations for discharge plan and follow up therapies    Assistance Recommended at Discharge  Intermittent Supervision/Assistance  Functional Status Assessment Patient has had a recent decline in their functional status and demonstrates the ability to make significant improvements in function in a reasonable and predictable amount of time.  Frequency and Duration min 2x/week  2 weeks      SLP Evaluation Cognition  Overall Cognitive Status: History of cognitive impairments - at baseline Arousal/Alertness: Awake/alert Orientation Level: Oriented X4 Attention: Focused;Sustained;Selective Focused Attention: Appears intact Sustained Attention: Appears intact Selective Attention: Impaired Selective Attention Impairment: Verbal complex;Functional complex Memory: Impaired Memory Impairment: Storage deficit;Decreased short term memory;Decreased recall of new information;Prospective memory Decreased Short Term Memory: Verbal basic;Functional basic Awareness: Impaired Awareness Impairment: Intellectual impairment;Emergent impairment;Anticipatory impairment Problem Solving: Impaired Problem Solving Impairment: Verbal complex;Functional complex Executive Function: Reasoning;Initiating;Self Monitoring;Self Correcting Reasoning: Impaired Reasoning Impairment: Verbal complex;Functional complex Initiating: Impaired Initiating Impairment: Verbal complex;Functional complex Behaviors:  (flat affect) Safety/Judgment: Impaired       Comprehension  Auditory Comprehension Overall Auditory Comprehension: Impaired Yes/No Questions: Within Functional Limits Commands: Impaired One Step Basic Commands: 75-100% accurate Two Step Basic Commands: 75-100% accurate Multistep Basic Commands: 50-74% accurate Complex Commands: 0-24% accurate Conversation:  Simple EffectiveTechniques: Repetition;Extra processing time;Visual/Gestural cues Reading Comprehension Reading Status: Within funtional limits    Expression Verbal Expression Overall Verbal Expression: Appears within  functional limits for tasks assessed Pragmatics: Impairment Impairments: Monotone;Abnormal affect Written Expression Dominant Hand: Right   Oral / Motor  Oral Motor/Sensory Function Overall Oral Motor/Sensory Function: Within functional limits Motor Speech Overall Motor Speech: Appears within functional limits for tasks assessed            Vikki Gains, Hardin Leys 09/07/2023, 1:35 PM

## 2023-09-07 NOTE — Progress Notes (Signed)
 PROGRESS NOTE    Bradley Mcclure  ZOX:[id number] DOB: Jul 02, 1960 DOA: 08/24/2023 PCP: [name]    Brief Narrative:  63 year old gentleman with history of pulmonary embolism, type 2 diabetes, history of MRSA bacteremia, history of dysphagia and cricopharyngeal narrowing who presented to the hospital with an aspiration event while eating a sandwich at work. Patient initially treated for aspiration pneumonia, continues to require high-dose oxygen.  Remains in the hospital on high flow oxygen.    Subjective: Patient seen and examined.  He tells me he had a good night.  Denies any complaints.  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  Does not notify any friends.  He is more awake and interactive today but he does not know that he is in the hospital for 2 weeks due to severe pneumonia.  On 7 L high flow today.   Assessment & Plan:   Aspiration pneumonia, multi focal pneumonia, acute hypoxemic respiratory failure and now suspected ARDS: And now suspected ARDS: Chest x-ray 5/8 with ill-defined nodular densities mid right lung and left mid  lower lung. Chest x-ray 5/13, improvement CT chest 5/15 with multifocal pneumonia Chest x-ray 5/18 with right-sided progressive multifocal pneumonia Patient was treated with broad-spectrum antibiotics with Unasyn  and vancomycin .  Completing vancomycin  today.  Blood cultures negative. Still remains on high flow oxygen. Keep on oxygen to keep saturation more than 90%.  Mobilize with PT OT. Suspected ARDS, treating with diuretics and steroids. Continue steroid taper. Continue Lasix  40 mg IV twice daily. Echocardiogram with normal ejection fraction. Net -19 L. Seen by speech therapy.  Currently on regular diet. Repeat chest x-ray today.  Dysphagia and esophagitis: Seen by speech therapy. CT scan of the neck with osteophytes.  Osteophytes projecting to the posterior pharynx. Neurosurgery, no surgical  recommendations.  History of PE: Therapeutic on Xarelto .  Depression: On Abilify  and Prozac .  Continue.  Cognitive impairment: Unclear baseline.  Speech therapy to evaluate for cognition.  Needs safety evaluation for discharge home.  No primary contact.     DVT prophylaxis:  rivaroxaban  (XARELTO ) tablet 20 mg   Code Status: Full code Family Communication: None, Disposition Plan: Status is: Inpatient Remains inpatient appropriate because: High flow oxygen     Consultants:  Critical care  Procedures:  None  Antimicrobials:  Unasyn  vancomycin , completed     Objective: Vitals:   09/06/23 2312 09/07/23 0406 09/07/23 0714 09/07/23 1104  BP: 111/71 124/65 113/65 119/68  Pulse: 80 77 81 81  Resp: 20 18 20 20   Temp: (!) 97.5 F (36.4 C) 97.6 F (36.4 C) (!) 97.5 F (36.4 C) 97.7 F (36.5 C)  TempSrc: Oral Oral Oral Oral  SpO2: 98% 97% 99% 95%  Weight:  99.5 kg    Height:        Intake/Output Summary (Last 24 hours) at 09/07/2023 1138 Last data filed at 09/07/2023 1108 Gross per 24 hour  Intake 1699.25 ml  Output 4150 ml  Net -2450.75 ml   Filed Weights   09/05/23 0551 09/06/23 0500 09/07/23 0406  Weight: 98.9 kg 99.2 kg 99.5 kg    Examination:  General exam: Appears calm and comfortable.  On 7 L oxygen.   Able to talk in complete sentences, however he is withdrawn and calm with flat affect. Respiratory system: Clear to auscultation. Respiratory effort normal.  No added sounds. Cardiovascular system: S1 & S2 heard, RRR.  Gastrointestinal system: Soft.  Nontender.  Bowel sounds positive. Central nervous  system: Alert and awake.  Oriented to himself and situation. Extremities: Symmetric 5 x 5 power. Generalized weakness.    Data Reviewed: I have personally reviewed following labs and imaging studies  CBC: Recent Labs  Lab 09/02/23 0235 09/03/23 0259 09/04/23 0326 09/05/23 1110 09/06/23 0306  WBC 7.3 7.5 7.4 16.8* 15.6*  NEUTROABS 5.4 5.3 4.6  13.4* 12.4*  HGB 13.9 13.7 13.3 14.0 14.0  HCT 40.7 40.8 39.2 41.4 41.3  MCV 91.9 92.9 92.7 92.2 93.0  PLT 212 204 228 306 260   Basic Metabolic Panel: Recent Labs  Lab 09/02/23 0235 09/03/23 0259 09/04/23 0326 09/05/23 1110 09/06/23 0306  NA 133* 132* 132* 132* 133*  K 3.6 3.2* 3.7 4.0 4.1  CL 92* 92* 94* 93* 94*  CO2 30 31 30 30 29   GLUCOSE 208* 176* 170* 302* 248*  BUN 14 12 8 15 15   CREATININE 0.59* 0.55* 0.55* 0.67 0.60*  CALCIUM 8.7* 8.6* 8.5* 9.1 9.1  MG 2.0 1.7 1.7 1.9 2.0  PHOS 2.7 2.7 2.3* 3.0 3.1   GFR: Estimated Creatinine Clearance: 121.8 mL/min (A) (by C-G formula based on SCr of 0.6 mg/dL (L)). Liver Function Tests: Recent Labs  Lab 09/02/23 0235 09/03/23 0259 09/04/23 0326 09/05/23 1110 09/06/23 0306  AST 32 39 24 24 23   ALT 66* 63* 50* 42 39  ALKPHOS 76 74 69 72 62  BILITOT 1.1 0.8 1.0 0.9 0.9  PROT 6.9 6.6 6.4* 7.1 6.7  ALBUMIN 2.6* 2.5* 2.5* 2.8* 2.8*   No results for input(s): "LIPASE", "AMYLASE" in the last 168 hours. No results for input(s): "AMMONIA" in the last 168 hours. Coagulation Profile: No results for input(s): "INR", "PROTIME" in the last 168 hours. Cardiac Enzymes: No results for input(s): "CKTOTAL", "CKMB", "CKMBINDEX", "TROPONINI" in the last 168 hours. BNP (last 3 results) No results for input(s): "PROBNP" in the last 8760 hours. HbA1C: No results for input(s): "HGBA1C" in the last 72 hours. CBG: Recent Labs  Lab 09/06/23 1043 09/06/23 1552 09/06/23 2054 09/07/23 0604 09/07/23 1105  GLUCAP 201* 340* 349* 284* 273*   Lipid Profile: No results for input(s): "CHOL", "HDL", "LDLCALC", "TRIG", "CHOLHDL", "LDLDIRECT" in the last 72 hours. Thyroid Function Tests: No results for input(s): "TSH", "T4TOTAL", "FREET4", "T3FREE", "THYROIDAB" in the last 72 hours. Anemia Panel: No results for input(s): "VITAMINB12", "FOLATE", "FERRITIN", "TIBC", "IRON", "RETICCTPCT" in the last 72 hours. Sepsis Labs: Recent Labs  Lab  09/03/23 0256 09/04/23 0326  PROCALCITON <0.10 <0.10    No results found for this or any previous visit (from the past 240 hours).       Radiology Studies: No results found.      Scheduled Meds:  ARIPiprazole   2 mg Oral Daily   furosemide   40 mg Intravenous BID   insulin  aspart  0-20 Units Subcutaneous TID WC   insulin  aspart  0-5 Units Subcutaneous QHS   insulin  aspart  10 Units Subcutaneous TID WC   insulin  glargine-yfgn  35 Units Subcutaneous Daily   magnesium  oxide  400 mg Oral Daily   metoprolol  tartrate  25 mg Oral BID   pantoprazole   40 mg Oral Daily   polyethylene glycol  17 g Oral BID   predniSONE  40 mg Oral Q breakfast   Followed by   Cecily Cohen ON 09/14/2023] predniSONE  20 mg Oral Q breakfast   Followed by   Cecily Cohen ON 09/21/2023] predniSONE  10 mg Oral Q breakfast   rivaroxaban   20 mg Oral Q supper   senna  1 tablet Oral QHS   Continuous Infusions:  vancomycin  1,500 mg (09/07/23 1031)     LOS: 14 days    Time spent: 40 minutes    Vada Garibaldi, MD [facility]

## 2023-09-08 ENCOUNTER — Inpatient Hospital Stay (HOSPITAL_COMMUNITY)

## 2023-09-08 DIAGNOSIS — T17908A Unspecified foreign body in respiratory tract, part unspecified causing other injury, initial encounter: Secondary | ICD-10-CM | POA: Diagnosis not present

## 2023-09-08 DIAGNOSIS — J690 Pneumonitis due to inhalation of food and vomit: Secondary | ICD-10-CM | POA: Diagnosis not present

## 2023-09-08 DIAGNOSIS — J9601 Acute respiratory failure with hypoxia: Secondary | ICD-10-CM | POA: Diagnosis not present

## 2023-09-08 LAB — GLUCOSE, CAPILLARY
Glucose-Capillary: 247 mg/dL — ABNORMAL HIGH (ref 70–99)
Glucose-Capillary: 297 mg/dL — ABNORMAL HIGH (ref 70–99)
Glucose-Capillary: 299 mg/dL — ABNORMAL HIGH (ref 70–99)
Glucose-Capillary: 353 mg/dL — ABNORMAL HIGH (ref 70–99)

## 2023-09-08 NOTE — Plan of Care (Signed)
   Problem: Education: Goal: Knowledge of General Education information will improve Description Including pain rating scale, medication(s)/side effects and non-pharmacologic comfort measures Outcome: Progressing

## 2023-09-08 NOTE — Inpatient Diabetes Management (Signed)
 Inpatient Diabetes Program Recommendations  AACE/ADA: New Consensus Statement on Inpatient Glycemic Control (2015)  Target Ranges:  Prepandial:   less than 140 mg/dL      Peak postprandial:   less than 180 mg/dL (1-2 hours)      Critically ill patients:  140 - 180 mg/dL   Lab Results  Component Value Date   GLUCAP 247 (H) 09/08/2023   HGBA1C 8.9 (H) 08/25/2023    Review of Glycemic Control  Latest Reference Range & Units 09/07/23 06:04 09/07/23 11:05 09/07/23 16:03 09/07/23 21:05 09/08/23 06:22  Glucose-Capillary 70 - 99 mg/dL 098 (H) 119 (H) 147 (H) 322 (H) 247 (H)  (H): Data is abnormally high  Diabetes history: DM 2 Outpatient Diabetes medications:  Metformin  1000 mg bid Semaglutide 2 mg weekly (Patient states he also takes Lantus  18 units daily?) Current orders for Inpatient glycemic control:  Novolog  0-20 units tid with meals and HS Semglee  35 units daily Novolog  13 units tid with meals  Prednisone taper  Inpatient Diabetes Program Recommendations:    Semglee  40 units QAM Novolog  16 units TID with meals if she consumes at least 50%  Thank you, Hays Lipschutz, MSN, CDCES Diabetes Coordinator Inpatient Diabetes Program [phone] (team pager from 8a-5p)

## 2023-09-08 NOTE — Progress Notes (Signed)
 PROGRESS NOTE    Bradley Mcclure  QMV:[id number] DOB: 1961-02-10 DOA: 08/24/2023 PCP: [name]    Brief Narrative:  63 year old gentleman with history of pulmonary embolism, type 2 diabetes, history of MRSA bacteremia, history of dysphagia and cricopharyngeal narrowing who presented to the hospital with an aspiration event while eating a sandwich at work. Patient initially treated for aspiration pneumonia, continues to require high-dose oxygen.  Remains in the hospital on supplemental oxygen and slow to wean off.  Subjective:  Patient seen and examined.  Slightly more interactive today.  He denies any shortness of breath or dyspnea.  He was able to wean off to 3 L of oxygen today.  Chest x-ray shows some bilateral pleural effusion more on the right side.  Will send to IR for thoracentesis.  Also not doing any chest physiotherapy. Incentive spirometry on the sealed bag at the bedside.   Assessment & Plan:   Aspiration pneumonia, multi focal pneumonia, acute hypoxemic respiratory failure and now suspected ARDS: And now suspected ARDS: Chest x-ray 5/8 with ill-defined nodular densities mid right lung and left mid  lower lung. Chest x-ray 5/13, improvement CT chest 5/15 with multifocal pneumonia Chest x-ray 5/18 with right-sided progressive multifocal pneumonia Patient was treated with broad-spectrum antibiotics with Unasyn  and vancomycin .  Completing vancomycin  today.  Blood cultures negative. Still remains on high flow oxygen. Keep on oxygen to keep saturation more than 90%.  Mobilize with PT OT. Suspected ARDS, treating with diuretics and steroids. Continue steroid taper. On IV Lasix , will continue today as he is responding very appropriately. Echocardiogram with normal ejection fraction. Net -19 L. Seen by speech therapy.  Currently on regular diet. Diagnostic and therapeutic thoracentesis today and hope to improve oxygenation.  Dysphagia and esophagitis: Seen by  speech therapy. CT scan of the neck with osteophytes.  Osteophytes projecting to the posterior pharynx. Neurosurgery, no surgical recommendations.  History of PE: Therapeutic on Xarelto .  Depression: On Abilify  and Prozac .  Continue.  Cognitive impairment: Unclear baseline.  Apparently works as a Electrical engineer.  Will be difficult to discharge with oxygen.  May need to stay in the hospital next few days until oxygenation improves to go home.    DVT prophylaxis:  rivaroxaban  (XARELTO ) tablet 20 mg   Code Status: Full code Family Communication: None, patient does not want to call anybody. Disposition Plan: Status is: Inpatient Remains inpatient appropriate because: High flow oxygen     Consultants:  Critical care  Procedures:  None  Antimicrobials:  Unasyn  vancomycin , completed     Objective: Vitals:   09/08/23 0205 09/08/23 0438 09/08/23 0718 09/08/23 0736  BP: 106/68 122/76 128/78 120/74  Pulse: 74 73 72   Resp: 18 17 20 20   Temp: 98.1 F (36.7 C) (!) 97.5 F (36.4 C) (!) 97.5 F (36.4 C) 98.6 F (37 C)  TempSrc: Axillary Oral Oral Oral  SpO2: 93% 95% 96% 92%  Weight:  97.9 kg    Height:        Intake/Output Summary (Last 24 hours) at 09/08/2023 1056 Last data filed at 09/08/2023 0840 Gross per 24 hour  Intake 477 ml  Output 3725 ml  Net -3248 ml   Filed Weights   09/06/23 0500 09/07/23 0406 09/08/23 0438  Weight: 99.2 kg 99.5 kg 97.9 kg    Examination:  General exam: Appears calm and comfortable.  On 4 L oxygen.   Able to talk in complete sentences, however he is withdrawn and calm with flat affect. Respiratory  system: Clear to auscultation. Respiratory effort normal.  No added sounds. Cardiovascular system: S1 & S2 heard, RRR.  Gastrointestinal system: Soft.  Nontender.  Bowel sounds positive. Central nervous system: Alert and awake.  Oriented to himself and situation. Extremities: Symmetric 5 x 5 power. Generalized weakness.    Data  Reviewed: I have personally reviewed following labs and imaging studies  CBC: Recent Labs  Lab 09/02/23 0235 09/03/23 0259 09/04/23 0326 09/05/23 1110 09/06/23 0306  WBC 7.3 7.5 7.4 16.8* 15.6*  NEUTROABS 5.4 5.3 4.6 13.4* 12.4*  HGB 13.9 13.7 13.3 14.0 14.0  HCT 40.7 40.8 39.2 41.4 41.3  MCV 91.9 92.9 92.7 92.2 93.0  PLT 212 204 228 306 260   Basic Metabolic Panel: Recent Labs  Lab 09/02/23 0235 09/03/23 0259 09/04/23 0326 09/05/23 1110 09/06/23 0306  NA 133* 132* 132* 132* 133*  K 3.6 3.2* 3.7 4.0 4.1  CL 92* 92* 94* 93* 94*  CO2 30 31 30 30 29   GLUCOSE 208* 176* 170* 302* 248*  BUN 14 12 8 15 15   CREATININE 0.59* 0.55* 0.55* 0.67 0.60*  CALCIUM 8.7* 8.6* 8.5* 9.1 9.1  MG 2.0 1.7 1.7 1.9 2.0  PHOS 2.7 2.7 2.3* 3.0 3.1   GFR: Estimated Creatinine Clearance: 121 mL/min (A) (by C-G formula based on SCr of 0.6 mg/dL (L)). Liver Function Tests: Recent Labs  Lab 09/02/23 0235 09/03/23 0259 09/04/23 0326 09/05/23 1110 09/06/23 0306  AST 32 39 24 24 23   ALT 66* 63* 50* 42 39  ALKPHOS 76 74 69 72 62  BILITOT 1.1 0.8 1.0 0.9 0.9  PROT 6.9 6.6 6.4* 7.1 6.7  ALBUMIN 2.6* 2.5* 2.5* 2.8* 2.8*   No results for input(s): "LIPASE", "AMYLASE" in the last 168 hours. No results for input(s): "AMMONIA" in the last 168 hours. Coagulation Profile: No results for input(s): "INR", "PROTIME" in the last 168 hours. Cardiac Enzymes: No results for input(s): "CKTOTAL", "CKMB", "CKMBINDEX", "TROPONINI" in the last 168 hours. BNP (last 3 results) No results for input(s): "PROBNP" in the last 8760 hours. HbA1C: No results for input(s): "HGBA1C" in the last 72 hours. CBG: Recent Labs  Lab 09/07/23 0604 09/07/23 1105 09/07/23 1603 09/07/23 2105 09/08/23 0622  GLUCAP 284* 273* 298* 322* 247*   Lipid Profile: No results for input(s): "CHOL", "HDL", "LDLCALC", "TRIG", "CHOLHDL", "LDLDIRECT" in the last 72 hours. Thyroid Function Tests: No results for input(s): "TSH",  "T4TOTAL", "FREET4", "T3FREE", "THYROIDAB" in the last 72 hours. Anemia Panel: No results for input(s): "VITAMINB12", "FOLATE", "FERRITIN", "TIBC", "IRON", "RETICCTPCT" in the last 72 hours. Sepsis Labs: Recent Labs  Lab 09/03/23 0256 09/04/23 0326  PROCALCITON <0.10 <0.10    No results found for this or any previous visit (from the past 240 hours).       Radiology Studies: DG CHEST PORT 1 VIEW Result Date: 09/07/2023 CLINICAL DATA:  Hypoxemia EXAM: PORTABLE CHEST 1 VIEW COMPARISON:  Chest x-ray 09/03/2023 FINDINGS: Bilateral airspace disease, left greater than right, has not significantly changed. There is a the a small right pleural effusion. There is no pneumothorax. The cardiomediastinal silhouette is within normal limits. No acute fractures are seen. IMPRESSION: 1. Stable bilateral airspace disease. 2. Small right pleural effusion. Electronically Signed   By: Tyron Gallon M.D.   On: 09/07/2023 15:52        Scheduled Meds:  ARIPiprazole   2 mg Oral Daily   furosemide   40 mg Intravenous BID   insulin  aspart  0-20 Units Subcutaneous TID WC   insulin   aspart  0-5 Units Subcutaneous QHS   insulin  aspart  13 Units Subcutaneous TID WC   insulin  glargine-yfgn  35 Units Subcutaneous Daily   magnesium  oxide  400 mg Oral Daily   metoprolol  tartrate  25 mg Oral BID   pantoprazole   40 mg Oral Daily   polyethylene glycol  17 g Oral BID   predniSONE  40 mg Oral Q breakfast   Followed by   Cecily Cohen ON 09/14/2023] predniSONE  20 mg Oral Q breakfast   Followed by   Cecily Cohen ON 09/21/2023] predniSONE  10 mg Oral Q breakfast   rivaroxaban   20 mg Oral Q supper   senna  1 tablet Oral QHS   Continuous Infusions:     LOS: 15 days    Time spent: 40 minutes    Vada Garibaldi, MD [facility]

## 2023-09-08 NOTE — Plan of Care (Signed)

## 2023-09-08 NOTE — Progress Notes (Signed)
 Physical Therapy Treatment Patient Details Name: Bradley Mcclure MRN: [id number] DOB: Jan 09, 1961 Today's Date: 09/08/2023   History of Present Illness Patient is a 63 y/o male admitted 08/24/23 with aspiration when choking on roast beef sandwich.  Pt needing 4L O2 with SpO2 85% and HR 120's, RR 20's.  PMH positive for h/o esophageal dysphagia, cognitive impairment, GERD, DM2, PE on anticoagulation, NAFLD, depression, HTN and HLD.    PT Comments  Pt resting in bed on arrival and agreeable to session. Pt pleasant throughout session, however continues to demonstrate poor initiation, requiring cues for sequencing throughout mobility, as well as repeated cues for breathing techniques.  Pt demonstrating bed mobility, transfers and gait at grossly CGA level for safety with no overt LOB noted. SpO2 93% on 3L on arrival at rest, dropping to 86% standing EOB, increased to 4L with SpO2 increasing to 88%, again dropping with activity to 85%, unable to recover, O2 increased to 6L and pt able to maintain SpO2 88-89% throughout ambulation, increasing to 91% once seated EOB in <10 seconds, replaced 3L at end of session with SpO2 92% on 3L at rest, RN updated and aware. Pt continues to benefit from skilled PT services to progress toward functional mobility goals.      If plan is discharge home, recommend the following: A little help with bathing/dressing/bathroom;A little help with walking and/or transfers;Assist for transportation;Supervision due to cognitive status;Help with stairs or ramp for entrance   Can travel by private vehicle     Yes  Equipment Recommendations  None recommended by PT    Recommendations for Other Services       Precautions / Restrictions Precautions Precautions: Fall Recall of Precautions/Restrictions: Impaired Precaution/Restrictions Comments: watch sats Restrictions Weight Bearing Restrictions Per Provider Order: No     Mobility  Bed Mobility Overal bed mobility: Needs  Assistance Bed Mobility: Sit to Supine, Supine to Sit     Supine to sit: HOB elevated, Used rails, Min assist Sit to supine: Contact guard assist, HOB elevated, Used rails   General bed mobility comments: min A to elevate trunk to EOB and maintain balance when donning socks    Transfers Overall transfer level: Needs assistance Equipment used: None Transfers: Sit to/from Stand Sit to Stand: Supervision           General transfer comment: sup for safety with sit<>stand, heavy reliance on UE use to power up    Ambulation/Gait Ambulation/Gait assistance: Contact guard assist Gait Distance (Feet): 175 Feet Assistive device: None Gait Pattern/deviations: Step-through pattern, Decreased step length - right, Decreased stance time - right, Drifts right/left Gait velocity: decr     General Gait Details: mild unsteadiness and drift to Lt. no overt LOB.   Stairs             Wheelchair Mobility     Tilt Bed    Modified Rankin (Stroke Patients Only)       Balance Overall balance assessment: Needs assistance Sitting-balance support: Feet supported Sitting balance-Leahy Scale: Normal     Standing balance support: No upper extremity supported, During functional activity Standing balance-Leahy Scale: Fair Standing balance comment: no assistive device with hallway ambulation and to bathroom, some imbalance noted, though stood to urinate at toilet without physical assist and no LOB, and washed hands at sink                            Communication Communication Communication: No apparent difficulties  Cognition Arousal: Alert Behavior During Therapy: Flat affect   PT - Cognitive impairments: History of cognitive impairments, No family/caregiver present to determine baseline                       PT - Cognition Comments: Poor awareness of breathing, ability to take deep breaths when cued. Following commands: Impaired Following commands  impaired: Follows multi-step commands with increased time, Follows one step commands with increased time    Cueing Cueing Techniques: Verbal cues  Exercises      General Comments General comments (skin integrity, edema, etc.): SpO2 93% on 3L on arrival at rest, dropping to 86% standing EOB, increased to 4L with SpO2 increasing to 88%, dropping to 85% with ambulation on 4L, unable to recover, O2 increased to 6L and pt able to maintain SpO2 88-89% throughout ambulation, increasing to 91% once seated EOB, replaced 3L at end of session with SpO2 92% on 3L at rest.      Pertinent Vitals/Pain Pain Assessment Pain Assessment: No/denies pain Pain Intervention(s): Monitored during session    Home Living                          Prior Function            PT Goals (current goals can now be found in the care plan section) Acute Rehab PT Goals PT Goal Formulation: Patient unable to participate in goal setting Time For Goal Achievement: 09/08/23 Progress towards PT goals: Progressing toward goals    Frequency    Min 2X/week      PT Plan      Co-evaluation              AM-PAC PT "6 Clicks" Mobility   Outcome Measure  Help needed turning from your back to your side while in a flat bed without using bedrails?: A Little Help needed moving from lying on your back to sitting on the side of a flat bed without using bedrails?: A Little Help needed moving to and from a bed to a chair (including a wheelchair)?: A Little Help needed standing up from a chair using your arms (e.g., wheelchair or bedside chair)?: A Little Help needed to walk in hospital room?: A Little Help needed climbing 3-5 steps with a railing? : Total 6 Click Score: 16    End of Session Equipment Utilized During Treatment: Gait belt;Oxygen Activity Tolerance: Patient tolerated treatment well Patient left: in bed;with call bell/phone within reach;with bed alarm set Nurse Communication: Mobility  status;Other (comment) (SpO2 level) PT Visit Diagnosis: Other abnormalities of gait and mobility (R26.89);Unsteadiness on feet (R26.81);Muscle weakness (generalized) (M62.81)     Time: 2440-1027 PT Time Calculation (min) (ACUTE ONLY): 29 min  Charges:    $Gait Training: 8-22 mins $Therapeutic Activity: 8-22 mins PT General Charges $$ ACUTE PT VISIT: 1 Visit                     Da Michelle R. PTA Acute Rehabilitation Services Office: [phone]   Agapito Horseman 09/08/2023, 10:38 AM

## 2023-09-09 DIAGNOSIS — J9601 Acute respiratory failure with hypoxia: Secondary | ICD-10-CM | POA: Diagnosis not present

## 2023-09-09 DIAGNOSIS — J690 Pneumonitis due to inhalation of food and vomit: Secondary | ICD-10-CM | POA: Diagnosis not present

## 2023-09-09 DIAGNOSIS — T17908A Unspecified foreign body in respiratory tract, part unspecified causing other injury, initial encounter: Secondary | ICD-10-CM | POA: Diagnosis not present

## 2023-09-09 LAB — COMPREHENSIVE METABOLIC PANEL WITH GFR
ALT: 49 U/L — ABNORMAL HIGH (ref 0–44)
AST: 22 U/L (ref 15–41)
Albumin: 3.1 g/dL — ABNORMAL LOW (ref 3.5–5.0)
Alkaline Phosphatase: 61 U/L (ref 38–126)
Anion gap: 11 (ref 5–15)
BUN: 25 mg/dL — ABNORMAL HIGH (ref 6–20)
CO2: 32 mmol/L (ref 22–32)
Calcium: 9.4 mg/dL (ref 8.9–10.3)
Chloride: 90 mmol/L — ABNORMAL LOW (ref 98–111)
Creatinine, Ser: 0.67 mg/dL (ref 0.61–1.24)
GFR, Estimated: >60 mL/min (ref >60)
Glucose, Bld: 287 mg/dL — ABNORMAL HIGH (ref 70–99)
Potassium: 3.4 mmol/L — ABNORMAL LOW (ref 3.5–5.1)
Sodium: 133 mmol/L — ABNORMAL LOW (ref 135–145)
Total Bilirubin: 0.7 mg/dL (ref 0.0–1.2)
Total Protein: 7 g/dL (ref 6.5–8.1)

## 2023-09-09 LAB — GLUCOSE, CAPILLARY
Glucose-Capillary: 187 mg/dL — ABNORMAL HIGH (ref 70–99)
Glucose-Capillary: 276 mg/dL — ABNORMAL HIGH (ref 70–99)
Glucose-Capillary: 301 mg/dL — ABNORMAL HIGH (ref 70–99)
Glucose-Capillary: 225 mg/dL — ABNORMAL HIGH (ref 70–99)

## 2023-09-09 LAB — CBC WITH DIFFERENTIAL/PLATELET
Abs Immature Granulocytes: 0.12 10*3/uL — ABNORMAL HIGH (ref 0.00–0.07)
Basophils Absolute: 0.1 10*3/uL (ref 0.0–0.1)
Basophils Relative: 0 %
Eosinophils Absolute: 0.1 10*3/uL (ref 0.0–0.5)
Eosinophils Relative: 1 %
HCT: 43.9 % (ref 39.0–52.0)
Hemoglobin: 14.9 g/dL (ref 13.0–17.0)
Immature Granulocytes: 1 %
Lymphocytes Relative: 26 %
Lymphs Abs: 3.9 10*3/uL (ref 0.7–4.0)
MCH: 31 pg (ref 26.0–34.0)
MCHC: 33.9 g/dL (ref 30.0–36.0)
MCV: 91.3 fL (ref 80.0–100.0)
Monocytes Absolute: 1.2 10*3/uL — ABNORMAL HIGH (ref 0.1–1.0)
Monocytes Relative: 8 %
Neutro Abs: 9.7 10*3/uL — ABNORMAL HIGH (ref 1.7–7.7)
Neutrophils Relative %: 64 %
Platelets: 320 10*3/uL (ref 150–400)
RBC: 4.81 MIL/uL (ref 4.22–5.81)
RDW: 12.6 % (ref 11.5–15.5)
WBC: 15.1 10*3/uL — ABNORMAL HIGH (ref 4.0–10.5)
nRBC: 0 % (ref 0.0–0.2)

## 2023-09-09 LAB — MAGNESIUM: Magnesium: 2.1 mg/dL (ref 1.7–2.4)

## 2023-09-09 MED ORDER — POTASSIUM CHLORIDE CRYS ER 20 MEQ PO TBCR
40.0000 meq | EXTENDED_RELEASE_TABLET | Freq: Two times a day (BID) | ORAL | Status: AC
  Administered 2023-09-09 – 2023-09-10 (×4): 40 meq via ORAL
  Filled 2023-09-09 (×4): qty 2

## 2023-09-09 MED ORDER — FUROSEMIDE 40 MG PO TABS
40.0000 mg | ORAL_TABLET | Freq: Every day | ORAL | Status: DC
  Administered 2023-09-10 – 2023-09-14 (×5): 40 mg via ORAL
  Filled 2023-09-09 (×5): qty 1

## 2023-09-09 NOTE — Plan of Care (Signed)
   Problem: Education: Goal: Knowledge of General Education information will improve Description Including pain rating scale, medication(s)/side effects and non-pharmacologic comfort measures Outcome: Progressing

## 2023-09-09 NOTE — Progress Notes (Signed)
 PROGRESS NOTE    Bradley Mcclure  ZOX:[id number] DOB: [birth year] DOA: 08/24/2023 PCP: [name]    Brief Narrative:  63 year old gentleman with history of pulmonary embolism, type 2 diabetes, history of MRSA bacteremia, history of dysphagia and cricopharyngeal narrowing who presented to the hospital with an aspiration event while eating a sandwich at work. Patient initially treated for aspiration pneumonia, continues to require high-dose oxygen.  Remains in the hospital on supplemental oxygen and slow to wean off.  Subjective:  Patient seen and examined.  Denies any complaints.  Appetite is fair.  He is on minimum oxygen today. He has not been doing incentive spirometer yet, instructions given.   Assessment & Plan:   Aspiration pneumonia, multi focal pneumonia, acute hypoxemic respiratory failure and now suspected ARDS:  Chest x-ray 5/8 with ill-defined nodular densities mid right lung and left mid  lower lung. Chest x-ray 5/13, improvement CT chest 5/15 with multifocal pneumonia Chest x-ray 5/18 with right-sided progressive multifocal pneumonia Patient was treated with broad-spectrum antibiotics with Unasyn  and vancomycin .  Completing vancomycin  today.  Blood cultures negative. Oxygenation improving. Keep on oxygen to keep saturation more than 90%.  Mobilize with PT OT. Suspected ARDS, treating with diuretics and steroids. Continue steroid taper.  Prolonged steroid taper. Excellent response to IV diuresis, -23 L.  Will change to oral Lasix  today. Echocardiogram with normal ejection fraction. Seen by speech therapy.  Currently on regular diet. Not enough pleural effusion to drain.  Dysphagia and esophagitis: Seen by speech therapy. CT scan of the neck with osteophytes.  Osteophytes projecting to the posterior pharynx. Neurosurgery, no surgical recommendations.  History of PE: Therapeutic on Xarelto .  Depression: On Abilify  and Prozac .  Continue.  Cognitive  impairment: Unclear baseline.  Apparently works as a Electrical engineer.  Mentation gradually improving.  Will need safety evaluation to discharge home when ready.    DVT prophylaxis:  rivaroxaban  (XARELTO ) tablet 20 mg   Code Status: Full code Family Communication: None, patient does not want to call anybody. Disposition Plan: Status is: Inpatient Remains inpatient appropriate because: High flow oxygen     Consultants:  Critical care  Procedures:  None  Antimicrobials:  Unasyn  vancomycin , completed     Objective: Vitals:   09/08/23 2013 09/09/23 0034 09/09/23 0515 09/09/23 0743  BP: 112/73 105/74 115/76 109/71  Pulse: 82 73 70   Resp:    18  Temp: 97.7 F (36.5 C) (!) 97.4 F (36.3 C) (!) 97.5 F (36.4 C) 97.7 F (36.5 C)  TempSrc: Oral Oral Oral Oral  SpO2: 98% 95% 94%   Weight:      Height:        Intake/Output Summary (Last 24 hours) at 09/09/2023 1058 Last data filed at 09/09/2023 1035 Gross per 24 hour  Intake 654 ml  Output 2100 ml  Net -1446 ml   Filed Weights   09/06/23 0500 09/07/23 0406 09/08/23 0438  Weight: 99.2 kg 99.5 kg 97.9 kg    Examination:  General exam: Appears calm and comfortable.  On 3 L oxygen.   Looks fairly comfortable.  Flat affect. Respiratory system: Clear to auscultation. Respiratory effort normal.  No added sounds. Cardiovascular system: S1 & S2 heard, RRR.  Gastrointestinal system: Soft.  Nontender.  Bowel sounds positive. Central nervous system: Alert and awake.  Mostly oriented but not very interactive. Extremities: Symmetric 5 x 5 power. Generalized weakness.    Data Reviewed: I have personally reviewed following labs and imaging studies  CBC: Recent Labs  Lab 09/03/23 0259 09/04/23 0326 09/05/23 1110 09/06/23 0306 09/09/23 0308  WBC 7.5 7.4 16.8* 15.6* 15.1*  NEUTROABS 5.3 4.6 13.4* 12.4* 9.7*  HGB 13.7 13.3 14.0 14.0 14.9  HCT 40.8 39.2 41.4 41.3 43.9  MCV 92.9 92.7 92.2 93.0 91.3  PLT 204 228 306 260  320   Basic Metabolic Panel: Recent Labs  Lab 09/03/23 0259 09/04/23 0326 09/05/23 1110 09/06/23 0306 09/09/23 0308  NA 132* 132* 132* 133* 133*  K 3.2* 3.7 4.0 4.1 3.4*  CL 92* 94* 93* 94* 90*  CO2 31 30 30 29  32  GLUCOSE 176* 170* 302* 248* 287*  BUN 12 8 15 15  25*  CREATININE 0.55* 0.55* 0.67 0.60* 0.67  CALCIUM 8.6* 8.5* 9.1 9.1 9.4  MG 1.7 1.7 1.9 2.0 2.1  PHOS 2.7 2.3* 3.0 3.1  --    GFR: Estimated Creatinine Clearance: 121 mL/min (by C-G formula based on SCr of 0.67 mg/dL). Liver Function Tests: Recent Labs  Lab 09/03/23 0259 09/04/23 0326 09/05/23 1110 09/06/23 0306 09/09/23 0308  AST 39 24 24 23 22   ALT 63* 50* 42 39 49*  ALKPHOS 74 69 72 62 61  BILITOT 0.8 1.0 0.9 0.9 0.7  PROT 6.6 6.4* 7.1 6.7 7.0  ALBUMIN 2.5* 2.5* 2.8* 2.8* 3.1*   No results for input(s): "LIPASE", "AMYLASE" in the last 168 hours. No results for input(s): "AMMONIA" in the last 168 hours. Coagulation Profile: No results for input(s): "INR", "PROTIME" in the last 168 hours. Cardiac Enzymes: No results for input(s): "CKTOTAL", "CKMB", "CKMBINDEX", "TROPONINI" in the last 168 hours. BNP (last 3 results) No results for input(s): "PROBNP" in the last 8760 hours. HbA1C: No results for input(s): "HGBA1C" in the last 72 hours. CBG: Recent Labs  Lab 09/08/23 0622 09/08/23 1134 09/08/23 1633 09/08/23 2049 09/09/23 0754  GLUCAP 247* 297* 299* 353* 187*   Lipid Profile: No results for input(s): "CHOL", "HDL", "LDLCALC", "TRIG", "CHOLHDL", "LDLDIRECT" in the last 72 hours. Thyroid Function Tests: No results for input(s): "TSH", "T4TOTAL", "FREET4", "T3FREE", "THYROIDAB" in the last 72 hours. Anemia Panel: No results for input(s): "VITAMINB12", "FOLATE", "FERRITIN", "TIBC", "IRON", "RETICCTPCT" in the last 72 hours. Sepsis Labs: Recent Labs  Lab 09/03/23 0256 09/04/23 0326  PROCALCITON <0.10 <0.10    No results found for this or any previous visit (from the past 240 hours).        Radiology Studies: DG CHEST PORT 1 VIEW Result Date: 09/07/2023 CLINICAL DATA:  Hypoxemia EXAM: PORTABLE CHEST 1 VIEW COMPARISON:  Chest x-ray 09/03/2023 FINDINGS: Bilateral airspace disease, left greater than right, has not significantly changed. There is a the a small right pleural effusion. There is no pneumothorax. The cardiomediastinal silhouette is within normal limits. No acute fractures are seen. IMPRESSION: 1. Stable bilateral airspace disease. 2. Small right pleural effusion. Electronically Signed   By: Tyron Gallon M.D.   On: 09/07/2023 15:52        Scheduled Meds:  ARIPiprazole   2 mg Oral Daily   [START ON 09/10/2023] furosemide   40 mg Oral Daily   insulin  aspart  0-20 Units Subcutaneous TID WC   insulin  aspart  0-5 Units Subcutaneous QHS   insulin  aspart  13 Units Subcutaneous TID WC   insulin  glargine-yfgn  35 Units Subcutaneous Daily   magnesium  oxide  400 mg Oral Daily   metoprolol  tartrate  25 mg Oral BID   pantoprazole   40 mg Oral Daily   polyethylene glycol  17 g Oral BID   potassium chloride   40 mEq Oral BID   predniSONE  40 mg Oral Q breakfast   Followed by   Cecily Cohen ON 09/14/2023] predniSONE  20 mg Oral Q breakfast   Followed by   Cecily Cohen ON 09/21/2023] predniSONE  10 mg Oral Q breakfast   rivaroxaban   20 mg Oral Q supper   senna  1 tablet Oral QHS   Continuous Infusions:     LOS: 16 days    Time spent: 40 minutes    Vada Garibaldi, MD [facility]

## 2023-09-09 NOTE — Plan of Care (Signed)
  Problem: Clinical Measurements: Goal: Ability to maintain clinical measurements within normal limits will improve Outcome: Progressing Goal: Diagnostic test results will improve Outcome: Progressing Goal: Respiratory complications will improve Outcome: Progressing   Problem: Nutrition: Goal: Adequate nutrition will be maintained Outcome: Progressing   Problem: Coping: Goal: Level of anxiety will decrease Outcome: Progressing

## 2023-09-10 DIAGNOSIS — J690 Pneumonitis due to inhalation of food and vomit: Secondary | ICD-10-CM | POA: Diagnosis not present

## 2023-09-10 DIAGNOSIS — J9601 Acute respiratory failure with hypoxia: Secondary | ICD-10-CM | POA: Diagnosis not present

## 2023-09-10 DIAGNOSIS — T17908A Unspecified foreign body in respiratory tract, part unspecified causing other injury, initial encounter: Secondary | ICD-10-CM | POA: Diagnosis not present

## 2023-09-10 LAB — GLUCOSE, CAPILLARY
Glucose-Capillary: 216 mg/dL — ABNORMAL HIGH (ref 70–99)
Glucose-Capillary: 317 mg/dL — ABNORMAL HIGH (ref 70–99)
Glucose-Capillary: 324 mg/dL — ABNORMAL HIGH (ref 70–99)
Glucose-Capillary: 223 mg/dL — ABNORMAL HIGH (ref 70–99)

## 2023-09-10 NOTE — Plan of Care (Signed)
  Problem: Education: Goal: Knowledge of General Education information will improve Description: Including pain rating scale, medication(s)/side effects and non-pharmacologic comfort measures Outcome: Progressing   Problem: Activity: Goal: Risk for activity intolerance will decrease Outcome: Progressing   Problem: Coping: Goal: Level of anxiety will decrease Outcome: Progressing   Problem: Fluid Volume: Goal: Ability to maintain a balanced intake and output will improve Outcome: Progressing

## 2023-09-10 NOTE — Progress Notes (Signed)
 PROGRESS NOTE    HARM JOU  ZOX:[id number] DOB: 1960/06/26 DOA: 08/24/2023 PCP: [name]    Brief Narrative:  63 year old gentleman with history of pulmonary embolism, type 2 diabetes, history of MRSA bacteremia, history of dysphagia and cricopharyngeal narrowing who presented to the hospital with an aspiration event while eating a sandwich at work. Patient initially treated for aspiration pneumonia, continues to require high-dose oxygen.  Remains in the hospital on supplemental oxygen and slow to wean off.  Subjective:  Patient seen and examined.  Flat affect.  Denies any complaints.  He thinks he can come off the oxygen and go home. Discussed with nursing staff to wean off oxygen and also teach insulin  techniques.   Assessment & Plan:   Aspiration pneumonia, multi focal pneumonia, acute hypoxemic respiratory failure and now suspected ARDS:  Chest x-ray 5/8 with ill-defined nodular densities mid right lung and left mid  lower lung. Chest x-ray 5/13, improvement CT chest 5/15 with multifocal pneumonia Chest x-ray 5/18 with right-sided progressive multifocal pneumonia Patient was treated with broad-spectrum antibiotics with Unasyn  and vancomycin .  Completing vancomycin  today.  Blood cultures negative. Oxygenation improving. Keep on oxygen to keep saturation more than 90%.  Mobilize with PT OT. Suspected ARDS, treating with diuretics and steroids. Continue steroid taper.  Prolonged steroid taper. Excellent response to IV diuresis, -23 L.  Changed to oral Lasix . Echocardiogram with normal ejection fraction. Seen by speech therapy.  Currently on regular diet. Not enough pleural effusion to drain.  Dysphagia and esophagitis: Seen by speech therapy. CT scan of the neck with osteophytes.  Osteophytes projecting to the posterior pharynx. Neurosurgery, no surgical recommendations.  History of PE: Therapeutic on Xarelto .  Depression: On Abilify  and Prozac .   Continue.  Cognitive impairment: Unclear baseline.  Apparently works as a Electrical engineer.  Mentation gradually improving.  Will need safety evaluation to discharge home when ready.    DVT prophylaxis:  rivaroxaban  (XARELTO ) tablet 20 mg   Code Status: Full code Family Communication: None,  Disposition Plan: Status is: Inpatient Remains inpatient appropriate because: On oxygen.  Safe disposition home pending.     Consultants:  Critical care  Procedures:  None  Antimicrobials:  Unasyn  vancomycin , completed     Objective: Vitals:   09/09/23 2118 09/10/23 0011 09/10/23 0402 09/10/23 0721  BP: 110/71 115/71 116/72 108/66  Pulse: 81 73 76 78  Resp:    20  Temp:  (!) 97.5 F (36.4 C) (!) 97.4 F (36.3 C) 97.6 F (36.4 C)  TempSrc:  Oral Oral Oral  SpO2:  98% 96% 100%  Weight:      Height:        Intake/Output Summary (Last 24 hours) at 09/10/2023 1031 Last data filed at 09/10/2023 8119 Gross per 24 hour  Intake 480 ml  Output 2650 ml  Net -2170 ml   Filed Weights   09/06/23 0500 09/07/23 0406 09/08/23 0438  Weight: 99.2 kg 99.5 kg 97.9 kg    Examination:  General exam: Appears calm and comfortable.  Flat affect.  On minimum oxygen. Respiratory system: Clear to auscultation. Respiratory effort normal.  No added sounds. Cardiovascular system: S1 & S2 heard, RRR.  Gastrointestinal system: Soft.  Nontender.  Bowel sounds positive. Central nervous system: Alert and awake.  Mostly oriented but with flat affect and less spontaneous interaction. Extremities: Symmetric 5 x 5 power. Generalized weakness.    Data Reviewed: I have personally reviewed following labs and imaging studies  CBC: Recent Labs  Lab  09/04/23 0326 09/05/23 1110 09/06/23 0306 09/09/23 0308  WBC 7.4 16.8* 15.6* 15.1*  NEUTROABS 4.6 13.4* 12.4* 9.7*  HGB 13.3 14.0 14.0 14.9  HCT 39.2 41.4 41.3 43.9  MCV 92.7 92.2 93.0 91.3  PLT 228 306 260 320   Basic Metabolic Panel: Recent Labs   Lab 09/04/23 0326 09/05/23 1110 09/06/23 0306 09/09/23 0308  NA 132* 132* 133* 133*  K 3.7 4.0 4.1 3.4*  CL 94* 93* 94* 90*  CO2 30 30 29  32  GLUCOSE 170* 302* 248* 287*  BUN 8 15 15  25*  CREATININE 0.55* 0.67 0.60* 0.67  CALCIUM 8.5* 9.1 9.1 9.4  MG 1.7 1.9 2.0 2.1  PHOS 2.3* 3.0 3.1  --    GFR: Estimated Creatinine Clearance: 121 mL/min (by C-G formula based on SCr of 0.67 mg/dL). Liver Function Tests: Recent Labs  Lab 09/04/23 0326 09/05/23 1110 09/06/23 0306 09/09/23 0308  AST 24 24 23 22   ALT 50* 42 39 49*  ALKPHOS 69 72 62 61  BILITOT 1.0 0.9 0.9 0.7  PROT 6.4* 7.1 6.7 7.0  ALBUMIN 2.5* 2.8* 2.8* 3.1*   No results for input(s): "LIPASE", "AMYLASE" in the last 168 hours. No results for input(s): "AMMONIA" in the last 168 hours. Coagulation Profile: No results for input(s): "INR", "PROTIME" in the last 168 hours. Cardiac Enzymes: No results for input(s): "CKTOTAL", "CKMB", "CKMBINDEX", "TROPONINI" in the last 168 hours. BNP (last 3 results) No results for input(s): "PROBNP" in the last 8760 hours. HbA1C: No results for input(s): "HGBA1C" in the last 72 hours. CBG: Recent Labs  Lab 09/09/23 0754 09/09/23 1125 09/09/23 1614 09/09/23 2115 09/10/23 0603  GLUCAP 187* 276* 301* 225* 216*   Lipid Profile: No results for input(s): "CHOL", "HDL", "LDLCALC", "TRIG", "CHOLHDL", "LDLDIRECT" in the last 72 hours. Thyroid Function Tests: No results for input(s): "TSH", "T4TOTAL", "FREET4", "T3FREE", "THYROIDAB" in the last 72 hours. Anemia Panel: No results for input(s): "VITAMINB12", "FOLATE", "FERRITIN", "TIBC", "IRON", "RETICCTPCT" in the last 72 hours. Sepsis Labs: Recent Labs  Lab 09/04/23 0326  PROCALCITON <0.10    No results found for this or any previous visit (from the past 240 hours).       Radiology Studies: IR US  CHEST Result Date: 09/09/2023 CLINICAL DATA:  Limited ultrasound of the chest prior to possible thoracentesis. EXAM: CHEST  ULTRASOUND COMPARISON:  CT scan of the chest done 08/31/2023 FINDINGS: There is no pleural fluid seen on the right. There is consolidated lung visualized IMPRESSION: No pleural fluid.  Thoracentesis not performed. Ultrasound performed by Nicky Barrack, PA-C Electronically Signed   By: Erica Hau M.D.   On: 09/09/2023 09:21        Scheduled Meds:  ARIPiprazole   2 mg Oral Daily   furosemide   40 mg Oral Daily   insulin  aspart  0-20 Units Subcutaneous TID WC   insulin  aspart  0-5 Units Subcutaneous QHS   insulin  aspart  13 Units Subcutaneous TID WC   insulin  glargine-yfgn  35 Units Subcutaneous Daily   magnesium  oxide  400 mg Oral Daily   metoprolol  tartrate  25 mg Oral BID   pantoprazole   40 mg Oral Daily   polyethylene glycol  17 g Oral BID   potassium chloride   40 mEq Oral BID   predniSONE  40 mg Oral Q breakfast   Followed by   Cecily Cohen ON 09/14/2023] predniSONE  20 mg Oral Q breakfast   Followed by   Cecily Cohen ON 09/21/2023] predniSONE  10 mg Oral Q breakfast  rivaroxaban   20 mg Oral Q supper   senna  1 tablet Oral QHS   Continuous Infusions:     LOS: 17 days    Time spent: 40 minutes    Vada Garibaldi, MD [facility]

## 2023-09-10 NOTE — Progress Notes (Signed)
 Nurse requested Mobility Specialist to perform oxygen saturation test with pt which includes removing pt from oxygen both at rest and while ambulating.  Below are the results from that testing.     Patient Saturations on Room Air at Rest = spO2 88%  Patient Saturations on Room Air while Ambulating = sp02 N/A% .   Patient Saturations on 3 Liters of oxygen while Ambulating = sp02 89%  At end of testing pt left in room on 1  Liters of oxygen.  Reported results to nurse.   Bradley Mcclure Mobility Specialist Please contact via SecureChat or  Rehab office at [phone]

## 2023-09-10 NOTE — Progress Notes (Signed)
 Mobility Specialist Progress Note:   09/10/23 1445  Mobility  Activity Ambulated with assistance in hallway  Level of Assistance Contact guard assist, steadying assist  Assistive Device None  Distance Ambulated (ft) 500 ft  Activity Response Tolerated well  Mobility Referral Yes  Mobility visit 1 Mobility  Mobility Specialist Start Time (ACUTE ONLY) 1445  Mobility Specialist Stop Time (ACUTE ONLY) 1500  Mobility Specialist Time Calculation (min) (ACUTE ONLY) 15 min   Pt agreeable to mobility session. Required CGA to ambulate with no AD use. 3-4LO2 needed to maintain SpO2 >88%. Pt with no c/o throughout. Left in chair with all needs met, alarm on.  Oneda Big Mobility Specialist Please contact via SecureChat or  Rehab office at [phone]

## 2023-09-11 DIAGNOSIS — T17908A Unspecified foreign body in respiratory tract, part unspecified causing other injury, initial encounter: Secondary | ICD-10-CM | POA: Diagnosis not present

## 2023-09-11 DIAGNOSIS — J9601 Acute respiratory failure with hypoxia: Secondary | ICD-10-CM | POA: Diagnosis not present

## 2023-09-11 DIAGNOSIS — J690 Pneumonitis due to inhalation of food and vomit: Secondary | ICD-10-CM | POA: Diagnosis not present

## 2023-09-11 LAB — GLUCOSE, CAPILLARY
Glucose-Capillary: 180 mg/dL — ABNORMAL HIGH (ref 70–99)
Glucose-Capillary: 272 mg/dL — ABNORMAL HIGH (ref 70–99)
Glucose-Capillary: 304 mg/dL — ABNORMAL HIGH (ref 70–99)
Glucose-Capillary: 310 mg/dL — ABNORMAL HIGH (ref 70–99)

## 2023-09-11 NOTE — Progress Notes (Signed)
 Physical Therapy Treatment Patient Details Name: Bradley Mcclure MRN: [id number] DOB: 06-Apr-1961 Today's Date: 09/11/2023   History of Present Illness Patient is a 63 y/o male admitted 08/24/23 with aspiration when choking on roast beef sandwich.  Pt needing 4L O2 with SpO2 85% and HR 120's, RR 20's.  PMH positive for h/o esophageal dysphagia, cognitive impairment, GERD, DM2, PE on anticoagulation, NAFLD, depression, HTN and HLD.    PT Comments  Pt admitted with above diagnosis. Pt met 0/5 goals set previously. Revised goals today. Pt has made slow progress due to decr strength, decr endurance and poor safety awareness. Continue to recommend post acute rehab < 3 hours day. Pt was on RA at rest and O2 91%.  Did need 2LO2 with activity to keep sats > 86%. Will continue to follow acutely.  Pt currently with functional limitations due to the deficits listed below (see PT Problem List). Pt will benefit from acute skilled PT to increase their independence and safety with mobility to allow discharge.       If plan is discharge home, recommend the following: A little help with bathing/dressing/bathroom;A little help with walking and/or transfers;Assist for transportation;Supervision due to cognitive status;Help with stairs or ramp for entrance   Can travel by private vehicle     Yes  Equipment Recommendations  None recommended by PT    Recommendations for Other Services OT consult (cognition?)     Precautions / Restrictions Precautions Precautions: Fall Recall of Precautions/Restrictions: Impaired Precaution/Restrictions Comments: watch sats Restrictions Weight Bearing Restrictions Per Provider Order: No     Mobility  Bed Mobility Overal bed mobility: Needs Assistance Bed Mobility: Sit to Supine, Supine to Sit     Supine to sit: HOB elevated, Used rails, Contact guard Sit to supine: Contact guard assist, HOB elevated, Used rails        Transfers Overall transfer level: Needs  assistance Equipment used: None Transfers: Sit to/from Stand Sit to Stand: Supervision           General transfer comment: sup for safety with sit<>stand, heavy reliance on UE use to power up    Ambulation/Gait Ambulation/Gait assistance: Contact guard assist Gait Distance (Feet): 180 Feet Assistive device: None Gait Pattern/deviations: Step-through pattern, Decreased step length - right, Decreased stance time - right, Drifts right/left Gait velocity: decr Gait velocity interpretation: <1.31 ft/sec, indicative of household ambulator   General Gait Details: mild unsteadiness and drift to Lt. no overt LOB.  Takes shortened step length bilaterally.   Stairs             Wheelchair Mobility     Tilt Bed    Modified Rankin (Stroke Patients Only)       Balance Overall balance assessment: Needs assistance Sitting-balance support: Feet supported Sitting balance-Leahy Scale: Normal     Standing balance support: No upper extremity supported, During functional activity Standing balance-Leahy Scale: Fair Standing balance comment: no assistive device with hallway ambulation but cannot withstand challenges to balance                            Communication Communication Communication: No apparent difficulties  Cognition Arousal: Alert Behavior During Therapy: Flat affect   PT - Cognitive impairments: History of cognitive impairments, No family/caregiver present to determine baseline                       PT - Cognition Comments: Poor awareness of breathing, ability  to take deep breaths when cued. Following commands: Impaired Following commands impaired: Follows multi-step commands with increased time, Follows one step commands with increased time    Cueing Cueing Techniques: Verbal cues  Exercises      General Comments General comments (skin integrity, edema, etc.): 91% O2 on RA supine at rest; 86% on RA after pt sitting up to EOB.  Applied  2L and 91% with ambulation.  Removed O2 end of session and sats 91% on RA once back in bed.      Pertinent Vitals/Pain Pain Assessment Pain Assessment: Faces Faces Pain Scale: Hurts little more Breathing: normal Pain Location: feet with ambulation Pain Descriptors / Indicators: Tender Pain Intervention(s): Limited activity within patient's tolerance, Monitored during session, Repositioned    Home Living                          Prior Function            PT Goals (current goals can now be found in the care plan section) Acute Rehab PT Goals Patient Stated Goal: home PT Goal Formulation: Patient unable to participate in goal setting Time For Goal Achievement: 09/25/23 Potential to Achieve Goals: Fair Progress towards PT goals: Progressing toward goals    Frequency    Min 2X/week      PT Plan      Co-evaluation              AM-PAC PT "6 Clicks" Mobility   Outcome Measure  Help needed turning from your back to your side while in a flat bed without using bedrails?: A Little Help needed moving from lying on your back to sitting on the side of a flat bed without using bedrails?: A Little Help needed moving to and from a bed to a chair (including a wheelchair)?: A Little Help needed standing up from a chair using your arms (e.g., wheelchair or bedside chair)?: A Little Help needed to walk in hospital room?: A Little Help needed climbing 3-5 steps with a railing? : Total 6 Click Score: 16    End of Session Equipment Utilized During Treatment: Gait belt;Oxygen Activity Tolerance: Patient tolerated treatment well;Patient limited by fatigue Patient left: in bed;with call bell/phone within reach;with bed alarm set Nurse Communication: Mobility status;Other (comment) (SpO2 level) PT Visit Diagnosis: Other abnormalities of gait and mobility (R26.89);Unsteadiness on feet (R26.81);Muscle weakness (generalized) (M62.81)     Time: 5784-6962 PT Time Calculation  (min) (ACUTE ONLY): 19 min  Charges:    $Gait Training: 8-22 mins PT General Charges $$ ACUTE PT VISIT: 1 Visit                    Latoia Eyster M,PT Acute Rehab Services [phone]    Florencia Hunter 09/11/2023, 3:53 PM

## 2023-09-11 NOTE — Progress Notes (Signed)
 Mobility Specialist Progress Note:   09/11/23 1200  Mobility  Activity Ambulated with assistance in hallway  Level of Assistance Contact guard assist, steadying assist  Assistive Device None  Distance Ambulated (ft) 100 ft  Activity Response Tolerated well  Mobility Referral Yes  Mobility visit 1 Mobility  Mobility Specialist Start Time (ACUTE ONLY) 1200  Mobility Specialist Stop Time (ACUTE ONLY) 1215  Mobility Specialist Time Calculation (min) (ACUTE ONLY) 15 min   Pt agreeable to mobility session. Required steadying assist throughout ambulation with no AD use. Attempted to ambulate on RA, pt maintained 88-90% majority of session. Pt desat to 86%, further mobility deferred. Pt left in bed with all needs met, SpO2 90% on RA.   Oneda Big Mobility Specialist Please contact via SecureChat or  Rehab office at [phone]

## 2023-09-11 NOTE — Progress Notes (Signed)
 PROGRESS NOTE    Bradley Mcclure  EXB:[id number] DOB: Nov 27, 1960 DOA: 08/24/2023 PCP: [name]    Brief Narrative:  63 year old gentleman with history of pulmonary embolism, type 2 diabetes, history of MRSA bacteremia, history of dysphagia and cricopharyngeal narrowing who presented to the hospital with an aspiration event while eating a sandwich at work. Patient initially treated for aspiration pneumonia, continues to require high-dose oxygen.  Remains in the hospital on supplemental oxygen and slow to wean off as well not safe to discharge home.  Subjective:  Patient seen and examined.  As always he has flat affect but denies any complaints.  Patient tells me that he is ready to go back to work.  He did mobilize okay with minimal supervision however patient remains on insulin  and also on 2 L oxygen.  Given his cognition, not sure he can be discharged independently. Patient himself tells me that he can use insulin  if taught how to do it. Declines to go to SNF.  Discussed with patient about discharge planning, he is eager to learn insulin  techniques today and go home.  Discussed with nursing staff to teach insulin .   Assessment & Plan:   Aspiration pneumonia, multi focal pneumonia, acute hypoxemic respiratory failure and now suspected ARDS:  Chest x-ray 5/8 with ill-defined nodular densities mid right lung and left mid  lower lung. Chest x-ray 5/13, improvement CT chest 5/15 with multifocal pneumonia Chest x-ray 5/18 with right-sided progressive multifocal pneumonia Patient was treated with broad-spectrum antibiotics with Unasyn  and vancomycin .  Completing vancomycin  today.  Blood cultures negative. Oxygenation improving. Keep on oxygen to keep saturation more than 90%.  Mobilize with PT OT. Suspected ARDS, treating with diuretics and steroids. Continue Prolonged steroid taper. Excellent response to IV diuresis, -23 L.  Changed to oral Lasix . Echocardiogram with  normal ejection fraction. Seen by speech therapy.  Currently on regular diet. Not enough pleural effusion to drain.  Dysphagia and esophagitis: Seen by speech therapy. CT scan of the neck with osteophytes.  Osteophytes projecting to the posterior pharynx. Neurosurgery, no surgical recommendations.  History of PE: Therapeutic on Xarelto .  Depression: On Abilify  and Prozac .  Continue.  Type 2 diabetes with hyperglycemia: On Ozempic and metformin  at home.  Now blood sugars elevated due to use of prednisone.  He is expected to use prednisone taper for more than 2 weeks. Consult diabetic coordinator, patient should go home with insulin .  Medically stabilized.  Learn insulin  techniques.  Mobilize more and evaluate for safe discharge.   DVT prophylaxis:  rivaroxaban  (XARELTO ) tablet 20 mg   Code Status: Full code Family Communication: None,  Disposition Plan: Status is: Inpatient Remains inpatient appropriate because: On oxygen.  Safe disposition home pending.     Consultants:  Critical care  Procedures:  None  Antimicrobials:  Unasyn  vancomycin , completed     Objective: Vitals:   09/11/23 0723 09/11/23 0814 09/11/23 0815 09/11/23 1108  BP: 126/60 126/60  116/66  Pulse: 79 79  77  Resp: 18   18  Temp: 97.7 F (36.5 C)   97.7 F (36.5 C)  TempSrc: Oral   Oral  SpO2: 92%  93% 90%  Weight:      Height:        Intake/Output Summary (Last 24 hours) at 09/11/2023 1148 Last data filed at 09/11/2023 1104 Gross per 24 hour  Intake 897 ml  Output 2325 ml  Net -1428 ml   Filed Weights   09/07/23 0406 09/08/23 0438 09/11/23 0350  Weight: 99.5 kg 97.9 kg 98.6 kg    Examination:  General exam: Appears calm and comfortable.  Answers interview questions however he is not very interactive. Respiratory system: Clear to auscultation. Respiratory effort normal.  No added sounds. Cardiovascular system: S1 & S2 heard, RRR.  Gastrointestinal system: Soft.  Nontender.  Bowel  sounds positive. Central nervous system: Alert and awake.  Mostly oriented but with flat affect and less spontaneous interaction. Extremities: Symmetric 5 x 5 power.    Data Reviewed: I have personally reviewed following labs and imaging studies  CBC: Recent Labs  Lab 09/05/23 1110 09/06/23 0306 09/09/23 0308  WBC 16.8* 15.6* 15.1*  NEUTROABS 13.4* 12.4* 9.7*  HGB 14.0 14.0 14.9  HCT 41.4 41.3 43.9  MCV 92.2 93.0 91.3  PLT 306 260 320   Basic Metabolic Panel: Recent Labs  Lab 09/05/23 1110 09/06/23 0306 09/09/23 0308  NA 132* 133* 133*  K 4.0 4.1 3.4*  CL 93* 94* 90*  CO2 30 29 32  GLUCOSE 302* 248* 287*  BUN 15 15 25*  CREATININE 0.67 0.60* 0.67  CALCIUM 9.1 9.1 9.4  MG 1.9 2.0 2.1  PHOS 3.0 3.1  --    GFR: Estimated Creatinine Clearance: 121.4 mL/min (by C-G formula based on SCr of 0.67 mg/dL). Liver Function Tests: Recent Labs  Lab 09/05/23 1110 09/06/23 0306 09/09/23 0308  AST 24 23 22   ALT 42 39 49*  ALKPHOS 72 62 61  BILITOT 0.9 0.9 0.7  PROT 7.1 6.7 7.0  ALBUMIN 2.8* 2.8* 3.1*   No results for input(s): "LIPASE", "AMYLASE" in the last 168 hours. No results for input(s): "AMMONIA" in the last 168 hours. Coagulation Profile: No results for input(s): "INR", "PROTIME" in the last 168 hours. Cardiac Enzymes: No results for input(s): "CKTOTAL", "CKMB", "CKMBINDEX", "TROPONINI" in the last 168 hours. BNP (last 3 results) No results for input(s): "PROBNP" in the last 8760 hours. HbA1C: No results for input(s): "HGBA1C" in the last 72 hours. CBG: Recent Labs  Lab 09/10/23 1136 09/10/23 1559 09/10/23 2117 09/11/23 0607 09/11/23 1107  GLUCAP 317* 324* 223* 180* 272*   Lipid Profile: No results for input(s): "CHOL", "HDL", "LDLCALC", "TRIG", "CHOLHDL", "LDLDIRECT" in the last 72 hours. Thyroid Function Tests: No results for input(s): "TSH", "T4TOTAL", "FREET4", "T3FREE", "THYROIDAB" in the last 72 hours. Anemia Panel: No results for input(s):  "VITAMINB12", "FOLATE", "FERRITIN", "TIBC", "IRON", "RETICCTPCT" in the last 72 hours. Sepsis Labs: No results for input(s): "PROCALCITON", "LATICACIDVEN" in the last 168 hours.   No results found for this or any previous visit (from the past 240 hours).       Radiology Studies: No results found.       Scheduled Meds:  ARIPiprazole   2 mg Oral Daily   furosemide   40 mg Oral Daily   insulin  aspart  0-20 Units Subcutaneous TID WC   insulin  aspart  0-5 Units Subcutaneous QHS   insulin  aspart  13 Units Subcutaneous TID WC   insulin  glargine-yfgn  35 Units Subcutaneous Daily   magnesium  oxide  400 mg Oral Daily   metoprolol  tartrate  25 mg Oral BID   pantoprazole   40 mg Oral Daily   polyethylene glycol  17 g Oral BID   predniSONE  40 mg Oral Q breakfast   Followed by   Cecily Cohen ON 09/14/2023] predniSONE  20 mg Oral Q breakfast   Followed by   Cecily Cohen ON 09/21/2023] predniSONE  10 mg Oral Q breakfast   rivaroxaban   20 mg Oral  Q supper   senna  1 tablet Oral QHS   Continuous Infusions:     LOS: 18 days    Time spent: 40 minutes    Vada Garibaldi, MD [facility]

## 2023-09-11 NOTE — Inpatient Diabetes Management (Signed)
 Inpatient Diabetes Program Recommendations  AACE/ADA: New Consensus Statement on Inpatient Glycemic Control (2015)  Target Ranges:  Prepandial:   less than 140 mg/dL      Peak postprandial:   less than 180 mg/dL (1-2 hours)      Critically ill patients:  140 - 180 mg/dL   Lab Results  Component Value Date   GLUCAP 272 (H) 09/11/2023   HGBA1C 8.9 (H) 08/25/2023    Review of Glycemic Control  Diabetes history: DM2 Outpatient Diabetes medications: Lantus  18 units daily, Ozempic 2mg  weekly, metformin  1000 mg BID Current orders for Inpatient glycemic control: Semglee  35 daily, Novolog  0-20 TID with meals and 0-5 HS + 13 units TID  On Pred 40 QAM, 20 mg on 5/29, and 10 mg 6/5.  Inpatient Diabetes Program Recommendations:    Spoke with pt at bedside regarding his diabetes control and HgbA1C of 8.9%. Pt states he takes Lantus  18 units every morning, along with metformin  1000 mg BID, and Ozempic 2 mg weekly. States he doesn't mind adding rapid-acting insulin  while on steroids. Reviewed insulin  pen administration. Also discussed hypoglycemia s/s and treatment. 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 Gives insulin  injection in abdomen, rotating sites. Uses glucose meter to check blood sugars. Monitors 1-2x/day. Educated on The Plate Method, CHO's, portion control, avoiding caloric beverages, CBGs at home fasting and mid afternoon, F/U with PCP every 3 months, bring meter to PCP office, long and short term complications of uncontrolled BG, and importance of exercise. Very little physical activity outside of work.  Answered all questions. Discussed above with RN.  Thank you. Joni Net, RD, LDN, CDCES Inpatient Diabetes Coordinator [phone]

## 2023-09-11 NOTE — TOC Progression Note (Signed)
 Transition of Care [facility]) - Progression Note    Patient Details  Name: Bradley Mcclure MRN: [id number] Date of Birth: [birth year]  Transition of Care [facility]) CM/SW Contact  Arron Big, [place] Phone Number: 09/11/2023, 10:56 AM  Clinical Narrative:   Patient is more medically appropriate for SNF at this time. CSW spoke with patient at bedside and asked if he would like to go to SNF at discharge. Patient stated he wants to go home. CSW asked if patient would like HH, he stated no.   TOC will continue to follow.    Expected Discharge Plan: Home/Self Care Barriers to Discharge: Continued Medical Work up  Expected Discharge Plan and Services In-house Referral: Clinical Social Work     Living arrangements for the past 2 months: Apartment                                       Social Determinants of Health (SDOH) Interventions SDOH Screenings   Food Insecurity: No Food Insecurity (08/25/2023)  Housing: Low Risk  (08/25/2023)  Transportation Needs: No Transportation Needs (08/25/2023)  Utilities: Not At Risk (08/25/2023)  Tobacco Use: Low Risk  (08/26/2023)    Readmission Risk Interventions     No data to display

## 2023-09-12 DIAGNOSIS — T17908A Unspecified foreign body in respiratory tract, part unspecified causing other injury, initial encounter: Secondary | ICD-10-CM | POA: Diagnosis not present

## 2023-09-12 LAB — GLUCOSE, CAPILLARY
Glucose-Capillary: 229 mg/dL — ABNORMAL HIGH (ref 70–99)
Glucose-Capillary: 184 mg/dL — ABNORMAL HIGH (ref 70–99)
Glucose-Capillary: 181 mg/dL — ABNORMAL HIGH (ref 70–99)
Glucose-Capillary: 290 mg/dL — ABNORMAL HIGH (ref 70–99)
Glucose-Capillary: 257 mg/dL — ABNORMAL HIGH (ref 70–99)

## 2023-09-12 MED ORDER — VITAMIN B-12 1000 MCG PO TABS
500.0000 ug | ORAL_TABLET | Freq: Every day | ORAL | Status: DC
  Administered 2023-09-12 – 2023-09-14 (×3): 500 ug via ORAL
  Filled 2023-09-12 (×3): qty 1

## 2023-09-12 MED ORDER — INSULIN ASPART 100 UNIT/ML IJ SOLN
16.0000 [IU] | Freq: Three times a day (TID) | INTRAMUSCULAR | Status: DC
  Administered 2023-09-12 – 2023-09-14 (×7): 16 [IU] via SUBCUTANEOUS

## 2023-09-12 MED ORDER — METFORMIN HCL 500 MG PO TABS
1000.0000 mg | ORAL_TABLET | Freq: Two times a day (BID) | ORAL | Status: DC
  Administered 2023-09-12 – 2023-09-14 (×4): 1000 mg via ORAL
  Filled 2023-09-12 (×4): qty 2

## 2023-09-12 MED ORDER — ROSUVASTATIN CALCIUM 5 MG PO TABS
10.0000 mg | ORAL_TABLET | Freq: Every day | ORAL | Status: DC
  Administered 2023-09-12 – 2023-09-14 (×3): 10 mg via ORAL
  Filled 2023-09-12 (×3): qty 2

## 2023-09-12 NOTE — Hospital Course (Addendum)
 Bradley Mcclure  NFA:[id number] DOB: [birth year] DOA: 08/24/2023 PCP: [facility]  Brief Narrative/Hospital Course: 63 year old gentleman with history of pulmonary embolism, type 2 diabetes, history of MRSA bacteremia, history of dysphagia and cricopharyngeal narrowing who presented to the hospital with an aspiration event while eating a sandwich at work. Patient initially treated for aspiration pneumonia, continues to require high-dose oxygen.  Remains in the hospital on supplemental oxygen and slow to wean off as well not safe to discharge home.   Subjective: Seen and examined today Resting comfortably alert awake  Assessment and plan:  Aspiration pneumonia w/ Multi focal pneumonia Acute hypoxemic respiratory failure and now suspected ARDS Pleural effusion not enough for drainage:  Cxr 5/8 with ill-defined nodular densities mid right lung and left mid  lower lung.>Chest x-ray 5/13, improvement CT chest 5/15 with multifocal pneumonia> Chest x-ray 5/18 with right-sided progressive multifocal pneumonia Treated with broad spectrum antibiotics Unasyn  and vancomycin  completed vancomycin  5/26 Suspected ARDS so treating with steroids and diuretics as well. Echo with normal EF .Blood culture no growth to date, oxygenation improving Continue Bradley Mcclure- needing 3l at rest and >4 no ambulation SLP eval done and on regular diet.  Dysphagia and esophagitis: SLP eval appreciated CT scan of the neck with osteophytes- osteophytes projecting to the posterior pharynx. As per neurosurgery, no surgical recommendations.   History of PE: Cont Xarelto .   Depression: Cont home Abilify  and Prozac .   Type 2 diabetes with hyperglycemia: PTA on Ozempic and metformin , lantus  18 u. Uncontrolled due to steroid, continue sliding scale insulin , Premeal increased to 16 units this morning, continue 35 u Semglee  Seen by diabetes coordinator, - he needs close follow-up with PCP to adjust  insulin  as he comes  off steroid I-expected to use prednisone taper for more than 2 weeks. DM coordinator consulted  and discharge insulin  regimen has been started Recent Labs  Lab 09/11/23 1107 09/11/23 1530 09/11/23 2118 09/12/23 0625 09/12/23 1207  GLUCAP 272* 304* 310* 184* 181*

## 2023-09-12 NOTE — Inpatient Diabetes Management (Signed)
 Inpatient Diabetes Program Recommendations  AACE/ADA: New Consensus Statement on Inpatient Glycemic Control (2015)  Target Ranges:  Prepandial:   less than 140 mg/dL      Peak postprandial:   less than 180 mg/dL (1-2 hours)      Critically ill patients:  140 - 180 mg/dL   Lab Results  Component Value Date   GLUCAP 184 (H) 09/12/2023   HGBA1C 8.9 (H) 08/25/2023    Review of Glycemic Control  Latest Reference Range & Units 09/11/23 06:07 09/11/23 11:07 09/11/23 15:30 09/11/23 21:18 09/12/23 06:25  Glucose-Capillary 70 - 99 mg/dL 161 (H) 096 (H) 045 (H) 310 (H) 184 (H)  (H): Data is abnormally high  Diabetes history: DM2 Outpatient Diabetes medications: Lantus  18 units daily, Ozempic 2mg  weekly, metformin  1000 mg BID Current orders for Inpatient glycemic control: Semglee  35 daily, Novolog  0-20 TID with meals and 0-5 HS + 13 units TID  Inpatient Diabetes Program Recommendations:    Please consider increasing meal coverage:  Novolog  16 units TID if he consumes at least 50%.  Thank you, Hays Lipschutz, MSN, CDCES Diabetes Coordinator Inpatient Diabetes Program [phone] (team pager from 8a-5p)

## 2023-09-12 NOTE — Plan of Care (Signed)
  Problem: Clinical Measurements: Goal: Diagnostic test results will improve Outcome: Progressing Goal: Respiratory complications will improve Outcome: Progressing Goal: Cardiovascular complication will be avoided Outcome: Progressing   Problem: Activity: Goal: Risk for activity intolerance will decrease Outcome: Progressing   Problem: Nutrition: Goal: Adequate nutrition will be maintained Outcome: Progressing   Problem: Coping: Goal: Level of anxiety will decrease Outcome: Progressing   

## 2023-09-12 NOTE — Progress Notes (Signed)
 Mobility Specialist Progress Note:   09/12/23 1200  Mobility  Activity Ambulated with assistance in hallway  Level of Assistance Contact guard assist, steadying assist  Assistive Device None  Distance Ambulated (ft) 200 ft  Activity Response Tolerated well  Mobility Referral Yes  Mobility visit 1 Mobility  Mobility Specialist Start Time (ACUTE ONLY) 1200  Mobility Specialist Stop Time (ACUTE ONLY) 1212  Mobility Specialist Time Calculation (min) (ACUTE ONLY) 12 min   Pt agreeable to mobility session. Required contact assist for steadying d/t anterior lean. 4LO2 needed to maintain SpO2 89 or above. Pt back in bed with all needs met.   Oneda Big Mobility Specialist Please contact via SecureChat or  Rehab office at [phone]

## 2023-09-12 NOTE — Progress Notes (Signed)
 Nurse requested Mobility Specialist to perform oxygen saturation test with pt which includes removing pt from oxygen both at rest and while ambulating.  Below are the results from that testing.     Patient Saturations on Room Air at Rest = spO2 91%  Patient Saturations on Room Air while Ambulating = sp02 87% .   Patient Saturations on 4 Liters of oxygen while Ambulating = sp02 89%  At end of testing pt left in room on 3  Liters of oxygen.  Reported results to nurse.   Oneda Big Mobility Specialist Please contact via SecureChat or  Rehab office at [phone]

## 2023-09-12 NOTE — Progress Notes (Signed)
 PROGRESS NOTE Bradley Mcclure  EXB:[id number] DOB: [birth year] DOA: 08/24/2023 PCP: [name]  Brief Narrative/Hospital Course: Bradley Mcclure  [id number]:[id number] DOB: 03-25-1961 DOA: 08/24/2023 PCP: [name]  Brief Narrative/Hospital Course: 63 year old gentleman with history of pulmonary embolism, type 2 diabetes, history of MRSA bacteremia, history of dysphagia and cricopharyngeal narrowing who presented to the hospital with an aspiration event while eating a sandwich at work. Patient initially treated for aspiration pneumonia, continues to require high-dose oxygen.  Remains in the hospital on supplemental oxygen and slow to wean off as well not safe to discharge home.   Subjective: Seen and examined today Resting comfortably alert awake  Assessment and plan:  Aspiration pneumonia w/ Multi focal pneumonia Acute hypoxemic respiratory failure and now suspected ARDS Pleural effusion not enough for drainage:  Cxr 5/8 with ill-defined nodular densities mid right lung and left mid  lower lung.>Chest x-ray 5/13, improvement CT chest 5/15 with multifocal pneumonia> Chest x-ray 5/18 with right-sided progressive multifocal pneumonia Treated with broad spectrum antibiotics Unasyn  and vancomycin  completed vancomycin  5/26 Suspected ARDS so treating with steroids and diuretics as well. Echo with normal EF .Blood culture no growth to date, oxygenation improving Continue [place]- needing 3l at rest and >4 no ambulation SLP eval done and on regular diet.  Dysphagia and esophagitis: SLP eval appreciated CT scan of the neck with osteophytes- osteophytes projecting to the posterior pharynx. As per neurosurgery, no surgical recommendations.   History of PE: Cont Xarelto .   Depression: Cont home Abilify  and Prozac .   Type 2 diabetes with hyperglycemia: PTA on Ozempic and metformin , lantus  18 u. Uncontrolled due to steroid, continue sliding scale insulin , Premeal increased to  16 units this morning, continue 35 u Semglee  Seen by diabetes coordinator, - he needs close follow-up with PCP to adjust  insulin  as he comes off steroid I-expected to use prednisone taper for more than 2 weeks. DM coordinator consulted  and discharge insulin  regimen has been started Recent Labs  Lab 09/11/23 1107 09/11/23 1530 09/11/23 2118 09/12/23 0625 09/12/23 1207  GLUCAP 272* 304* 310* 184* 181*   DVT prophylaxis: Xarelto  Code Status:   Code Status: Full Code Family Communication: plan of care discussed with patient at bedside. Patient status is: Remains hospitalized because of severity of illness Level of care: Med-Surg   Dispo: The patient is from: home alone            Anticipated disposition: TBD-Home pending safe disposition-anticipate next 24 hr, if  blood sugar remains stable Objective: Vitals last 24 hrs: Vitals:   09/12/23 0007 09/12/23 0508 09/12/23 0730 09/12/23 1145  BP: 112/71 125/65 131/86   Pulse: 75 71 81 80  Resp: 18 18 18 15   Temp: 97.7 F (36.5 C) 97.9 F (36.6 C) 97.8 F (36.6 C)   TempSrc: Oral Oral Oral Oral  SpO2: 96% 90% 96% 96%  Weight:  98.8 kg    Height:        Physical Examination: General exam: alert awake, older than stated age HEENT:Oral mucosa moist, Ear/Nose WNL grossly Respiratory system: Bilaterally cheezing BS, no use of accessory muscle Cardiovascular system: S1 & S2 +. Gastrointestinal system: Abdomen soft,  NT,ND,BS+ Nervous System: Alert, awake, following commands. Extremities: LE edema neg, warm extremities Skin: No rashes,warm. MSK: Normal muscle bulk/tone.   Data Reviewed: I have personally reviewed following labs and imaging studies ( see epic result tab) CBC: Recent Labs  Lab 09/06/23 0306 09/09/23 0308  WBC 15.6* 15.1*  NEUTROABS 12.4* 9.7*  HGB 14.0 14.9  HCT 41.3 43.9  MCV 93.0 91.3  PLT 260 320   CMP: Recent Labs  Lab 09/06/23 0306 09/09/23 0308  NA 133* 133*  K 4.1 3.4*  CL 94* 90*  CO2 29 32   GLUCOSE 248* 287*  BUN 15 25*  CREATININE 0.60* 0.67  CALCIUM 9.1 9.4  MG 2.0 2.1  PHOS 3.1  --    GFR: Estimated Creatinine Clearance: 121.5 mL/min (by C-G formula based on SCr of 0.67 mg/dL). Recent Labs  Lab 09/06/23 0306 09/09/23 0308  AST 23 22  ALT 39 49*  ALKPHOS 62 61  BILITOT 0.9 0.7  PROT 6.7 7.0  ALBUMIN 2.8* 3.1*   No results for input(s): "LIPASE", "AMYLASE" in the last 168 hours. No results for input(s): "AMMONIA" in the last 168 hours. Coagulation Profile: No results for input(s): "INR", "PROTIME" in the last 168 hours. Unresulted Labs (From admission, onward)    None      Antimicrobials/Microbiology: Anti-infectives (From admission, onward)    Start     Dose/Rate Route Frequency Ordered Stop   09/03/23 0200  vancomycin  (VANCOREADY) IVPB 1500 mg/300 mL        1,500 mg 150 mL/hr over 120 Minutes Intravenous 2 times daily 09/03/23 0121 09/07/23 1231   08/31/23 0700  vancomycin  (VANCOREADY) IVPB 750 mg/150 mL  Status:  Discontinued        750 mg 150 mL/hr over 60 Minutes Intravenous Every 12 hours 08/30/23 1837 09/03/23 0121   08/30/23 1930  vancomycin  (VANCOREADY) IVPB 2000 mg/400 mL        2,000 mg 200 mL/hr over 120 Minutes Intravenous  Once 08/30/23 1836 08/30/23 2202   08/29/23 1000  amoxicillin -clavulanate (AUGMENTIN ) 875-125 MG per tablet 1 tablet        1 tablet Oral Every 12 hours 08/29/23 0836 08/31/23 2118   08/25/23 1000  Ampicillin -Sulbactam (UNASYN ) 3 g in sodium chloride  0.9 % 100 mL IVPB  Status:  Discontinued        3 g 200 mL/hr over 30 Minutes Intravenous Every 6 hours 08/25/23 0859 08/29/23 0836         Component Value Date/Time   SDES  09/06/2022 0025    BLOOD SITE NOT SPECIFIED Performed at [facility], [address] [place], [place] [address]    SPECREQUEST  09/06/2022 0025    BOTTLES DRAWN AEROBIC AND ANAEROBIC Blood Culture adequate volume Performed at [facility], [address], [place], [place] [address]    CULT  09/06/2022 0025    NO GROWTH 5 DAYS Performed at [facility], [address] [place], [place] [address]    REPTSTATUS 09/11/2022 FINAL 09/06/2022 0025    Procedures:  Medications reviewed:  Scheduled Meds:  ARIPiprazole   2 mg Oral Daily   furosemide   40 mg Oral Daily   insulin  aspart  0-20 Units Subcutaneous TID WC   insulin  aspart  0-5 Units Subcutaneous QHS   insulin  aspart  16 Units Subcutaneous TID WC   insulin  glargine-yfgn  35 Units Subcutaneous Daily   magnesium  oxide  400 mg Oral Daily   metoprolol  tartrate  25 mg Oral BID   pantoprazole   40 mg Oral Daily   polyethylene glycol  17 g Oral BID   predniSONE  40 mg Oral Q breakfast   Followed by   Cecily Cohen ON 09/14/2023] predniSONE  20 mg Oral Q breakfast   Followed by   Cecily Cohen ON 09/21/2023] predniSONE  10 mg Oral Q breakfast   rivaroxaban   20 mg Oral Q supper   senna  1 tablet Oral QHS   Continuous Infusions:  Lesa Rape, MD [facility] 09/12/2023, 1:24 PM

## 2023-09-12 NOTE — TOC Progression Note (Signed)
 Transition of Care [facility]) - Progression Note    Patient Details  Name: Bradley Mcclure MRN: [id number] Date of Birth: Jan 23, 1961  [facility]) CM/SW Contact  Jennett Model, RN Phone Number: 09/12/2023, 3:33 PM  Clinical Narrative:    NCM spoke with patient at the bedside, informed him he will need home oxygen,  would like to go thru his [facility],  informed him he may have a copay of 15.00 or 20.00 , asked if that was ok, he says yes.  NCM made referral to [facility].  They are to check the copay amt and call this NCM back.     Expected Discharge Plan: Home/Self Care Barriers to Discharge: Continued Medical Work up  Expected Discharge Plan and Services In-house Referral: Clinical Social Work     Living arrangements for the past 2 months: Apartment                                       Social Determinants of Health (SDOH) Interventions SDOH Screenings   Food Insecurity: No Food Insecurity (08/25/2023)  Housing: Low Risk  (08/25/2023)  Transportation Needs: No Transportation Needs (08/25/2023)  Utilities: Not At Risk (08/25/2023)  Tobacco Use: Low Risk  (08/26/2023)    Readmission Risk Interventions     No data to display

## 2023-09-12 NOTE — Plan of Care (Signed)

## 2023-09-13 ENCOUNTER — Telehealth: Payer: Self-pay

## 2023-09-13 ENCOUNTER — Other Ambulatory Visit (HOSPITAL_COMMUNITY): Payer: Self-pay

## 2023-09-13 DIAGNOSIS — T17908A Unspecified foreign body in respiratory tract, part unspecified causing other injury, initial encounter: Secondary | ICD-10-CM | POA: Diagnosis not present

## 2023-09-13 LAB — GLUCOSE, CAPILLARY
Glucose-Capillary: 268 mg/dL — ABNORMAL HIGH (ref 70–99)
Glucose-Capillary: 222 mg/dL — ABNORMAL HIGH (ref 70–99)
Glucose-Capillary: 259 mg/dL — ABNORMAL HIGH (ref 70–99)
Glucose-Capillary: 185 mg/dL — ABNORMAL HIGH (ref 70–99)

## 2023-09-13 MED ORDER — INSULIN ASPART 100 UNIT/ML IJ SOLN
0.0000 [IU] | Freq: Three times a day (TID) | INTRAMUSCULAR | Status: DC
  Administered 2023-09-13: 5 [IU] via SUBCUTANEOUS
  Administered 2023-09-14: 3 [IU] via SUBCUTANEOUS

## 2023-09-13 MED ORDER — INSULIN ASPART 100 UNIT/ML IJ SOLN: 0.0000 [IU] | Freq: Every day | INTRAMUSCULAR | Status: DC

## 2023-09-13 NOTE — Telephone Encounter (Signed)
 Checking for pharmacy benefits

## 2023-09-13 NOTE — Progress Notes (Signed)
 Physical Therapy Treatment Patient Details Name: Bradley Mcclure MRN: [id number] DOB: 08-Mar-1961 Today's Date: 09/13/2023   History of Present Illness Patient is a 63 y/o male admitted 08/24/23 with aspiration when choking on roast beef sandwich.  Pt needing 4L O2 with SpO2 85% and HR 120's, RR 20's.  PMH positive for h/o esophageal dysphagia, cognitive impairment, GERD, DM2, PE on anticoagulation, NAFLD, depression, HTN and HLD.    PT Comments  Pt received in supine, agreeable to therapy session with encouragement. Pt able to perform step up/down via 7" platform step in room x3 reps with BUE support and Supervision, per home set-up he has curb step from parking lot then may have railing for 3 deeper standard height steps then may have level entry into home from that point but has ~10-85ft via sidewalk after 3 steps before reaching condo entrance. PTA discussed benefits of assistive device for energy conservation and stability, especially if he has supplemental O2 needs upon DC, but pt became irritable when PTA discussed option of getting assistive device (rollator) for home, stating "I don't need it". Difficult to assess pt insight into post-acute supplemental O2 needs as he is minimally conversant with flat affect and not receptive to discussion on plan for O2 management with return to work, etc. 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 Patient will benefit from continued inpatient follow up therapy, <3 hours/day however if pt has PRN supervision/assist post-acute could consider home but pt appears to live alone per chart review.   If plan is discharge home, recommend the following: A little help with bathing/dressing/bathroom;A little help with walking and/or transfers;Assist for transportation;Supervision due to cognitive status;Help with stairs or ramp for entrance   Can travel by private vehicle     Yes  Equipment Recommendations  None recommended by PT (Rollator but pt currently refusing  this device)    Recommendations for Other Services OT consult (cognition?)     Precautions / Restrictions Precautions Precautions: Fall Recall of Precautions/Restrictions: Impaired Precaution/Restrictions Comments: watch sats Restrictions Weight Bearing Restrictions Per Provider Order: No     Mobility  Bed Mobility Overal bed mobility: Needs Assistance Bed Mobility: Sit to Supine, Supine to Sit     Supine to sit: HOB elevated, Used rails, Supervision Sit to supine: Used rails, HOB elevated, Supervision   General bed mobility comments: good initiation after initial encouragement to attempt.    Transfers Overall transfer level: Needs assistance Equipment used: None Transfers: Sit to/from Stand Sit to Stand: Supervision           General transfer comment: to/from EOB, no physical assist, pt pushing with BUE    Ambulation/Gait Ambulation/Gait assistance: Supervision Gait Distance (Feet): 300 Feet Assistive device: Rollator (4 wheels) Gait Pattern/deviations: Step-through pattern Gait velocity: decr     General Gait Details: significant bil hip ER (may be his baseline), fair cadence, pt not drifiting significant while using rollator and appears steadier than in previous session; SpO2 91-92% and greater weaned down to 2L/min O2  (had been on 3L in room).   Stairs Stairs: Yes   Stair Management: Two rails, Step to pattern, Forwards, Backwards Number of Stairs: 3 General stair comments: 7" platform in room up/down with BUE support preferred by pt x 3 reps, per pt he has 1 curb step from parking lot then 3 STE with single rail he can reach, can try hallway stairs next session with 1 rail; no buckling or LOB   Wheelchair Mobility  Tilt Bed    Modified Rankin (Stroke Patients Only)       Balance Overall balance assessment: Needs assistance Sitting-balance support: Feet supported Sitting balance-Leahy Scale: Normal     Standing balance support: No  upper extremity supported, During functional activity Standing balance-Leahy Scale: Fair Standing balance comment: steadier wtih rollator                            Communication Communication Communication: No apparent difficulties  Cognition Arousal: Alert Behavior During Therapy: Flat affect   PT - Cognitive impairments: History of cognitive impairments, No family/caregiver present to determine baseline, Safety/Judgement, Problem solving                       PT - Cognition Comments: Unclear recent baseline, pt reports he wants to go back to work, unsure if pt is aware he may need to bring O2 tank wtih him when working/mobilizing at home, pt lives alone and appears unconcerned. Pt becomes more irritable when PTA reviewing energy conservation and use of rollator to progress standing/activity tolerance and transport O2 tank in community, stating he does not want AD. Following commands: Impaired Following commands impaired: Follows multi-step commands with increased time, Follows one step commands with increased time    Cueing Cueing Techniques: Verbal cues  Exercises      General Comments General comments (skin integrity, edema, etc.): SpO2 WFL on 2L O2 [place] 91% and above with gait trial; 3L O2 [place] in bed      Pertinent Vitals/Pain Pain Assessment Faces Pain Scale: Hurts little more Breathing: normal Pain Location: feet with ambulation Pain Descriptors / Indicators: Tender    Home Living                          Prior Function            PT Goals (current goals can now be found in the care plan section) Acute Rehab PT Goals Patient Stated Goal: home PT Goal Formulation: Patient unable to participate in goal setting Time For Goal Achievement: 09/25/23 Progress towards PT goals: Progressing toward goals    Frequency    Min 2X/week      PT Plan      Co-evaluation              AM-PAC PT "6 Clicks" Mobility   Outcome Measure   Help needed turning from your back to your side while in a flat bed without using bedrails?: A Little Help needed moving from lying on your back to sitting on the side of a flat bed without using bedrails?: A Little Help needed moving to and from a bed to a chair (including a wheelchair)?: A Little Help needed standing up from a chair using your arms (e.g., wheelchair or bedside chair)?: A Little Help needed to walk in hospital room?: A Little Help needed climbing 3-5 steps with a railing? : A Little 6 Click Score: 18    End of Session Equipment Utilized During Treatment: Gait belt;Oxygen Activity Tolerance: Patient tolerated treatment well Patient left: in bed;with call bell/phone within reach (refused chair; alarm was not on when PTA arrived to room, pt has BSC next to his bed) Nurse Communication: Mobility status;Other (comment) (pt refusing bed alarm) PT Visit Diagnosis: Other abnormalities of gait and mobility (R26.89);Unsteadiness on feet (R26.81);Muscle weakness (generalized) (M62.81)     Time: 1610-9604 PT Time  Calculation (min) (ACUTE ONLY): 21 min  Charges:    $Gait Training: 8-22 mins PT General Charges $$ ACUTE PT VISIT: 1 Visit                     Kynlea Blackston P., PTA Acute Rehabilitation Services Secure Chat Preferred 9a-5:30pm Office: [phone]    Mariel Shope [facility] 09/13/2023, 4:16 PM

## 2023-09-13 NOTE — Inpatient Diabetes Management (Addendum)
 Inpatient Diabetes Program Recommendations  AACE/ADA: New Consensus Statement on Inpatient Glycemic Control (2015)  Target Ranges:  Prepandial:   less than 140 mg/dL      Peak postprandial:   less than 180 mg/dL (1-2 hours)      Critically ill patients:  140 - 180 mg/dL   Lab Results  Component Value Date   GLUCAP 268 (H) 09/13/2023   HGBA1C 8.9 (H) 08/25/2023    Review of Glycemic Control  Latest Reference Range & Units 09/11/23 21:18 09/12/23 06:25 09/12/23 12:07 09/12/23 15:53 09/12/23 20:55 09/13/23 06:03  Glucose-Capillary 70 - 99 mg/dL 409 (H) 811 (H) 914 (H) 290 (H) 257 (H) 268 (H)  (H): Data is abnormally high Diabetes history: DM2 Outpatient Diabetes medications: Lantus  18 units daily, Ozempic 2mg  weekly, metformin  1000 mg BID Current orders for Inpatient glycemic control: Semglee  35 daily, Novolog  0-20 TID with meals and 0-5 HS + 16 units TID Prednisone 20 mg every day   Inpatient Diabetes Program Recommendations:     Consider increasing Semglee  42 units every day.   Addendum: Spoke with patient regarding outpatient diabetes management and plan for discharge.  Reviewed patient's current A1c of 8.9%. Explained what a A1c is and what it measures. Also reviewed goal A1c with patient, importance of good glucose control @ home, and blood sugar goals. Reviewed impact of steroids at length and plan for insulin  titration at discharge. Reviewed when to take short acting insulin  and the differences between short acting vs long acting insulin . Patient's nonverbal communication appears angry, however states that he is willing to perform injections. Patient is not a candidate for sliding scale insulin  at this time. Patient has flat affect. Did not want to use CGM. Has a meter at home. Reviewed recommended CBG frequency and when to call MD. Patient [facility]  Secure chat sent to MD with DC recs.    Thanks, Marjo Sievert, MSN,  RNC-OB Diabetes Coordinator [phone] (8a-5p)

## 2023-09-13 NOTE — Plan of Care (Signed)

## 2023-09-13 NOTE — Inpatient Diabetes Management (Signed)
 Inpatient Diabetes Program Recommendations  AACE/ADA: New Consensus Statement on Inpatient Glycemic Control (2015)  Target Ranges:  Prepandial:   less than 140 mg/dL      Peak postprandial:   less than 180 mg/dL (1-2 hours)      Critically ill patients:  140 - 180 mg/dL   Lab Results  Component Value Date   GLUCAP 222 (H) 09/13/2023   HGBA1C 8.9 (H) 08/25/2023     Discharge Recommendations: Other recommendations: After 7 days decrease Lantus  30 units qd, Novolog  8 units TID. Once steroids complete resume Lantus  16 units QD (dosages prior to admission) Long acting recommendations: Insulin  Glargine (LANTUS ) Solostar Pen 40 units QD  Short acting recommendations:  Meal coverage ONLY Insulin  aspart (NOVOLOG ) FlexPen  16 units TID   Supply/Referral recommendations: Glucometer Test strips Lancet device Lancets Pen needles - standard   Use Adult Diabetes Insulin  Treatment Post Discharge order set.  Thanks, Marjo Sievert, MSN, RNC-OB Diabetes Coordinator [phone] (8a-5p)

## 2023-09-13 NOTE — TOC Progression Note (Addendum)
 Transition of Care [facility]) - Progression Note    Patient Details  Name: [name] MRN: [id number] Date of Birth: [birth year]  [facility] CM/SW Contact  Jennett Model, RN Phone Number: 09/13/2023, 10:00 AM  Clinical Narrative:    Patient will need HHRN., HHPT, HHOT, NCM offered choice he is ok with [facility].  NCM made referral to [facility] with Gasper Karst,  he is able to take referral.  The PCP at the [facility] is Dr. Diania Fortes fax is [phone] , CSW is Kaithlin Neamo [phone] ext [phone].  NCM notified April at [place].  NCM faxed information to [place].    Expected Discharge Plan: Home/Self Care Barriers to Discharge: Continued Medical Work up  Expected Discharge Plan and Services In-house Referral: Clinical Social Work     Living arrangements for the past 2 months: Apartment                                       Social Determinants of Health (SDOH) Interventions SDOH Screenings   Food Insecurity: No Food Insecurity (08/25/2023)  Housing: Low Risk  (08/25/2023)  Transportation Needs: No Transportation Needs (08/25/2023)  Utilities: Not At Risk (08/25/2023)  Tobacco Use: Low Risk  (08/26/2023)    Readmission Risk Interventions     No data to display

## 2023-09-13 NOTE — Progress Notes (Signed)
 PROGRESS NOTE Bradley Mcclure  YQM:[id number] DOB: May 06, 1960 DOA: 08/24/2023 PCP: [name]  Brief Narrative/Hospital Course: Brief Narrative/Hospital Course: 63 year old gentleman with history of pulmonary embolism, type 2 diabetes, history of MRSA bacteremia, history of dysphagia and cricopharyngeal narrowing who presented to the hospital with an aspiration event while eating a sandwich at work. Patient initially treated for aspiration pneumonia, continues to require high-dose oxygen.  Remains in the hospital on supplemental oxygen and slow to wean off > at this time patient will need oxygen for discharge . She has refused skilled nursing facility as well as home health. He has been able to use insulin .  Patient adamant on going home on 5/27 but agreed to stay overnight.  Weaning down IV steroid, he has refused  SNF and even home health and finally agreed to have home health RN visiting him.  He is adamant on going home on today's.   Subjective: Seen examined, aaox3 Blood sugar in 250s. Afebrile. " I need to get out of here today and go home" I don't want to go to rehab for 3 weeks. He agreed for home health altho was refusing initially.  Assessment and plan:  Aspiration pneumonia w/ Multi focal pneumonia Acute hypoxemic respiratory failure and now suspected ARDS Pleural effusion not enough for drainage:  Patient was seen by PCCM.Cxr 5/8 with ill-defined nodular densities mid right lung and left mid  lower lung. Chest x-ray 5/13, improvement. CT chest 5/15 with multifocal pneumonia> Chest x-ray 5/18 with right-sided progressive multifocal pneumonia>Treated with broad spectrum antibiotics Unasyn  and vancomycin  completed vancomycin  5/26 Suspected ARDS so treating with steroids and diuretics as well as per pulmonary critical care.. Echo with normal EF .Blood culture no growth to date, oxygenation improving>Continue [place]- needing 3l at rest-setting up for home oxygen.SLP eval  done and on regular diet.  Dysphagia and esophagitis: SLP eval appreciated.CT scan of the neck with osteophytes- osteophytes projecting to the posterior pharynx. As per neurosurgery, no surgical recommendations.   History of PE: Cont Xarelto .   Depression: Cont home Abilify  and Prozac .   Type 2 diabetes with hyperglycemia, last A1c on 8.9 on 5/9: PTA on Ozempic and metformin , lantus  18 u. Uncontrolled due to steroid, adjusted Premeal and long-acting insulin  Seen by diabetes coordinator, - he needs close follow-up with PCP to adjust  insulin  as he comes off steroid which is expected to be used for more than 2 weeks-this has been discussed with the patient, he has verbalized understanding. Will discharge home on long-acting insulin , premeal and ssi> patient strongly advised to go to sleep but he is refusing, also refusing home health but after my counseling and discussion, he agreed to have home health RN -to manage medication monitor his insulin  and sugarSeen by DM coordinator.Patient is aware that once he is off his steroid he will need to cut back on his insulin  regimen to avoid hypoglycemia and complications. Dose is being cut down to 20 prednisone tomorrow so will finalize insulin  regimen 5/29 for discharge home Recent Labs  Lab 09/12/23 1207 09/12/23 1553 09/12/23 2055 09/13/23 0603 09/13/23 1049  GLUCAP 181* 290* 257* 268* 222*   DVT prophylaxis: Xarelto  Code Status:   Code Status: Full Code Family Communication: plan of care discussed with patient at bedside. Patient status is: Remains hospitalized because of severity of illness Level of care: Med-Surg   Dispo: The patient is from: home alone.            Anticipated disposition: SNF has been recommended but  he has refused.  He initially refused home health but at this time agreeable after extensive counseling.Anticipating discharge home tomorrow on appropriate dose of insulin  will monitor 1 more night.   Objective: Vitals  last 24 hrs: Vitals:   09/13/23 0030 09/13/23 0413 09/13/23 0721 09/13/23 1047  BP: 113/67 110/68 129/77 126/72  Pulse: 76 71 72 85  Resp: 19 19 19 18   Temp: 98.2 F (36.8 C) 97.7 F (36.5 C) 97.7 F (36.5 C)   TempSrc: Oral Oral Oral   SpO2: 97% 98% 98% 95%  Weight:  99 kg    Height:        Physical Examination: General exam: alert awake, oriented  HEENT:Oral mucosa moist, Ear/Nose WNL grossly Respiratory system: Bilaterally clear BS,no use of accessory muscle Cardiovascular system: S1 & S2 +, No JVD. Gastrointestinal system: Abdomen soft,NT,ND, BS+ Nervous System: Alert, awake, moving all extremities,and following commands. Extremities: LE edema neg,distal peripheral pulses palpable and warm.  Skin: No rashes,no icterus. MSK: Normal muscle bulk,tone, power    Data Reviewed: I have personally reviewed following labs and imaging studies ( see epic result tab) CBC: Recent Labs  Lab 09/09/23 0308  WBC 15.1*  NEUTROABS 9.7*  HGB 14.9  HCT 43.9  MCV 91.3  PLT 320   CMP: Recent Labs  Lab 09/09/23 0308  NA 133*  K 3.4*  CL 90*  CO2 32  GLUCOSE 287*  BUN 25*  CREATININE 0.67  CALCIUM 9.4  MG 2.1   GFR: Estimated Creatinine Clearance: 121.5 mL/min (by C-G formula based on SCr of 0.67 mg/dL). Recent Labs  Lab 09/09/23 0308  AST 22  ALT 49*  ALKPHOS 61  BILITOT 0.7  PROT 7.0  ALBUMIN 3.1*   No results for input(s): "LIPASE", "AMYLASE" in the last 168 hours. No results for input(s): "AMMONIA" in the last 168 hours. Coagulation Profile: No results for input(s): "INR", "PROTIME" in the last 168 hours. Unresulted Labs (From admission, onward)    None      Antimicrobials/Microbiology: Anti-infectives (From admission, onward)    Start     Dose/Rate Route Frequency Ordered Stop   09/03/23 0200  vancomycin  (VANCOREADY) IVPB 1500 mg/300 mL        1,500 mg 150 mL/hr over 120 Minutes Intravenous 2 times daily 09/03/23 0121 09/07/23 1231   08/31/23 0700   vancomycin  (VANCOREADY) IVPB 750 mg/150 mL  Status:  Discontinued        750 mg 150 mL/hr over 60 Minutes Intravenous Every 12 hours 08/30/23 1837 09/03/23 0121   08/30/23 1930  vancomycin  (VANCOREADY) IVPB 2000 mg/400 mL        2,000 mg 200 mL/hr over 120 Minutes Intravenous  Once 08/30/23 1836 08/30/23 2202   08/29/23 1000  amoxicillin -clavulanate (AUGMENTIN ) 875-125 MG per tablet 1 tablet        1 tablet Oral Every 12 hours 08/29/23 0836 08/31/23 2118   08/25/23 1000  Ampicillin -Sulbactam (UNASYN ) 3 g in sodium chloride  0.9 % 100 mL IVPB  Status:  Discontinued        3 g 200 mL/hr over 30 Minutes Intravenous Every 6 hours 08/25/23 0859 08/29/23 0836         Component Value Date/Time   SDES  09/06/2022 0025    BLOOD SITE NOT SPECIFIED Performed at [facility], [address], [place], [place] [address]    SPECREQUEST  09/06/2022 0025    BOTTLES DRAWN AEROBIC AND ANAEROBIC Blood Culture adequate volume Performed at [facility], [address] [place], [place] [address]    CULT  09/06/2022 0025    NO GROWTH 5 DAYS Performed at [facility], [address] [place], [place] [address]    REPTSTATUS 09/11/2022 FINAL 09/06/2022 0025    Procedures:  Medications reviewed:  Scheduled Meds:  ARIPiprazole   2 mg Oral Daily   cyanocobalamin   500 mcg Oral Daily   furosemide   40 mg Oral Daily   insulin  aspart  0-5 Units Subcutaneous QHS   insulin  aspart  0-9 Units Subcutaneous TID WC   insulin  aspart  16 Units Subcutaneous TID WC   insulin  glargine-yfgn  35 Units Subcutaneous Daily   magnesium  oxide  400 mg Oral Daily   metFORMIN   1,000 mg Oral BID WC   metoprolol  tartrate  25 mg Oral BID   pantoprazole   40 mg Oral Daily   polyethylene glycol  17 g Oral BID   [START ON 09/14/2023] predniSONE   20 mg Oral Q breakfast   Followed by   Cecily Cohen ON 09/21/2023] predniSONE   10 mg Oral Q breakfast   rivaroxaban   20 mg Oral Q supper   rosuvastatin    10 mg Oral Daily   senna  1 tablet Oral QHS   Continuous Infusions:  Lesa Rape, MD [facility] 09/13/2023, 1:18 PM

## 2023-09-13 NOTE — Progress Notes (Signed)
 Mobility Specialist Progress Note:   09/13/23 1500  Mobility  Activity Ambulated with assistance in hallway  Level of Assistance Contact guard assist, steadying assist  Assistive Device None  Distance Ambulated (ft) 200 ft  Activity Response Tolerated well  Mobility Referral Yes  Mobility visit 1 Mobility  Mobility Specialist Start Time (ACUTE ONLY) 1500  Mobility Specialist Stop Time (ACUTE ONLY) 1520  Mobility Specialist Time Calculation (min) (ACUTE ONLY) 20 min   Pt agreeable to mobility session. Required CGA to ambulate with no AD use. On 3LO2 with ambulation, pt SpO2 87-91% with no SOB. Left in bed with all needs met.  Oneda Big Mobility Specialist Please contact via SecureChat or  Rehab office at [phone]

## 2023-09-14 ENCOUNTER — Other Ambulatory Visit (HOSPITAL_COMMUNITY): Payer: Self-pay

## 2023-09-14 DIAGNOSIS — T17908A Unspecified foreign body in respiratory tract, part unspecified causing other injury, initial encounter: Secondary | ICD-10-CM | POA: Diagnosis not present

## 2023-09-14 LAB — GLUCOSE, CAPILLARY
Glucose-Capillary: 116 mg/dL — ABNORMAL HIGH (ref 70–99)
Glucose-Capillary: 225 mg/dL — ABNORMAL HIGH (ref 70–99)

## 2023-09-14 MED ORDER — FUROSEMIDE 40 MG PO TABS
40.0000 mg | ORAL_TABLET | Freq: Every day | ORAL | 0 refills | Status: DC
Start: 2023-09-14 — End: 2023-09-22

## 2023-09-14 MED ORDER — FUROSEMIDE 40 MG PO TABS
40.0000 mg | ORAL_TABLET | Freq: Every day | ORAL | 0 refills | Status: DC
Start: 2023-09-14 — End: 2023-09-14
  Filled 2023-09-14: qty 30, 30d supply, fill #0

## 2023-09-14 MED ORDER — BLOOD GLUCOSE TEST VI STRP
1.0000 | ORAL_STRIP | Freq: Three times a day (TID) | 0 refills | Status: DC
  Filled 2023-09-14: qty 100, 30d supply, fill #0

## 2023-09-14 MED ORDER — PREDNISONE 10 MG PO TABS: ORAL_TABLET | ORAL | 0 refills | Status: DC

## 2023-09-14 MED ORDER — INSULIN GLARGINE 100 UNIT/ML SOLOSTAR PEN
PEN_INJECTOR | SUBCUTANEOUS | 0 refills | Status: DC
  Filled 2023-09-14: qty 9, 30d supply, fill #0

## 2023-09-14 MED ORDER — LANCET DEVICE MISC
1.0000 | Freq: Three times a day (TID) | 0 refills | Status: DC
  Filled 2023-09-14: qty 1, fill #0

## 2023-09-14 MED ORDER — ACCU-CHEK SOFTCLIX LANCETS MISC
1.0000 | Freq: Three times a day (TID) | 0 refills | Status: DC
  Filled 2023-09-14: qty 100, 30d supply, fill #0

## 2023-09-14 MED ORDER — PANTOPRAZOLE SODIUM 40 MG PO TBEC: 40.0000 mg | DELAYED_RELEASE_TABLET | Freq: Every day | ORAL | 0 refills | Status: AC

## 2023-09-14 MED ORDER — PREDNISONE 10 MG PO TABS
ORAL_TABLET | ORAL | 0 refills | Status: DC
Start: 2023-09-14 — End: 2023-09-14
  Filled 2023-09-14: qty 21, 14d supply, fill #0

## 2023-09-14 MED ORDER — BLOOD GLUCOSE TEST VI STRP: 1.0000 | ORAL_STRIP | Freq: Three times a day (TID) | 0 refills | Status: DC

## 2023-09-14 MED ORDER — ACCU-CHEK SOFTCLIX LANCETS MISC: 1.0000 | Freq: Three times a day (TID) | 0 refills | Status: DC

## 2023-09-14 MED ORDER — INSULIN PEN NEEDLE 32G X 4 MM MISC: 1.0000 | Freq: Three times a day (TID) | 0 refills | Status: DC

## 2023-09-14 MED ORDER — SENNA 8.6 MG PO TABS
1.0000 | ORAL_TABLET | Freq: Every day | ORAL | 0 refills | Status: DC
Start: 2023-09-14 — End: 2023-09-14
  Filled 2023-09-14: qty 120, 120d supply, fill #0

## 2023-09-14 MED ORDER — INSULIN ASPART 100 UNIT/ML FLEXPEN: PEN_INJECTOR | SUBCUTANEOUS | 0 refills | Status: DC

## 2023-09-14 MED ORDER — INSULIN ASPART 100 UNIT/ML FLEXPEN
PEN_INJECTOR | SUBCUTANEOUS | 0 refills | Status: DC
  Filled 2023-09-14: qty 9, 30d supply, fill #0

## 2023-09-14 MED ORDER — INSULIN PEN NEEDLE 32G X 4 MM MISC
1.0000 | Freq: Three times a day (TID) | 0 refills | Status: DC
  Filled 2023-09-14: qty 100, 30d supply, fill #0

## 2023-09-14 MED ORDER — LANCET DEVICE MISC: 1.0000 | Freq: Three times a day (TID) | 0 refills | Status: DC

## 2023-09-14 MED ORDER — INSULIN GLARGINE 100 UNIT/ML SOLOSTAR PEN: PEN_INJECTOR | SUBCUTANEOUS | 0 refills | Status: DC

## 2023-09-14 MED ORDER — BLOOD GLUCOSE MONITOR SYSTEM W/DEVICE KIT
1.0000 | PACK | Freq: Three times a day (TID) | 0 refills | Status: DC
  Filled 2023-09-14: qty 1, 30d supply, fill #0

## 2023-09-14 MED ORDER — PANTOPRAZOLE SODIUM 40 MG PO TBEC
40.0000 mg | DELAYED_RELEASE_TABLET | Freq: Every day | ORAL | 0 refills | Status: DC
  Filled 2023-09-14: qty 30, 30d supply, fill #0

## 2023-09-14 NOTE — Plan of Care (Signed)

## 2023-09-14 NOTE — Progress Notes (Signed)
 Mobility Specialist Progress Note:   09/14/23 1215  Mobility  Activity Ambulated with assistance in hallway  Level of Assistance Contact guard assist, steadying assist  Assistive Device Four wheel walker  Distance Ambulated (ft) 500 ft  Activity Response Tolerated well  Mobility Referral Yes  Mobility visit 1 Mobility  Mobility Specialist Start Time (ACUTE ONLY) 1215  Mobility Specialist Stop Time (ACUTE ONLY) 1235  Mobility Specialist Time Calculation (min) (ACUTE ONLY) 20 min   Pt agreeable to mobility session. Required no physical assistance to ambulate with rollator. No unsteadiness noted. Required 2LO2 to maintain SpO2 WFL. Pt back in bed with all needs met.   Bradley Mcclure Mobility Specialist Please contact via SecureChat or  Rehab office at [phone]

## 2023-09-14 NOTE — Discharge Summary (Signed)
 Physician Discharge Summary  Bradley Mcclure ZOX:[id number] DOB: 1961/01/23 DOA: 08/24/2023  PCP: [name]  Admit date: 08/24/2023 Discharge date: 09/14/2023 Recommendations for Outpatient Follow-up:  Follow up with PCP in 1 weeks-call for appointment Please obtain BMP/CBC in one week  Discharge Dispo: Home w/ home health Discharge Condition: Stable Code Status:   Code Status: Full Code Diet recommendation:  Diet Order             Diet Carb Modified Fluid consistency: Thin; Room service appropriate? Yes  Diet effective now                   Brief Narrative/Hospital Course: 63 year old gentleman with history of pulmonary embolism, type 2 diabetes, history of MRSA bacteremia, history of dysphagia and cricopharyngeal narrowing who presented to the hospital with an aspiration event while eating a sandwich at work. Patient initially treated for aspiration pneumonia, continues to require high-dose oxygen.  Remains in the hospital on supplemental oxygen and slow to wean off > at this time patient will need oxygen for discharge . She has refused skilled nursing facility as well as home health. He has been able to use insulin .  Patient adamant on going home on 5/27 but agreed to stay overnight.  Weaning down IV steroid, he has refused  SNF and even home health and finally agreed to have home health RN visiting him.  He is adamant on going home. Finally able to convince him to stay 1 more night on 5/28 to monitor his blood sugar since cutting down on steroid.  Subjective: Seen and examined this morning Overnight afebrile BP stable  Cbg in 116 this am Eager to go home today  Discharge diagnosis:  Aspiration pneumonia w/ Multi focal pneumonia Acute hypoxemic respiratory failure and now suspected ARDS Pleural effusion not enough for drainage:  Patient was seen by PCCM.Cxr 5/8 with ill-defined nodular densities mid right lung and left mid  lower lung. Chest x-ray 5/13,  improvement. CT chest 5/15 with multifocal pneumonia> Chest x-ray 5/18 with right-sided progressive multifocal pneumonia>Treated with broad spectrum antibiotics Unasyn  and vancomycin  completed vancomycin  5/26 Suspected ARDS so treating with steroids and diuretics as well as per pulmonary critical care.. Echo with normal EF .Blood culture no growth to date, oxygenation improving>Continue [place]- needing 3l at rest-setting up for home oxygen.SLP eval done and on regular diet.  Dysphagia and esophagitis: SLP eval appreciated.CT scan of the neck with osteophytes- osteophytes projecting to the posterior pharynx. As per neurosurgery, no surgical recommendations.   History of PE: Cont Xarelto .   Depression: Cont home Abilify  and Prozac .   Type 2 diabetes with hyperglycemia, last A1c on 8.9 on 5/9: PTA on Ozempic and metformin , lantus  18 u. Uncontrolled due to steroid use as well as poorly controlled at baseline Seen by diabetes coordinator and insulin  regimen adjusted He is aware he needs to adjust  insulin  as he comes off steroid which is expected to be used for  2 wks now-Patient is aware that once he is off his steroid he will need to cut back on his insulin  regimen to avoid hypoglycemia and complications.patient strongly advised to go to SNF as per PTOT but he refused including home health but after my counseling and discussion, he agreed for [facility] and a referral has been made  Dose is cut down to 20 prednisone from 5/29 At discharge will plan for Lantus  36 units (0.36 ml)  [facility] daily x 7 days then  25 units (0.25 ml)  daily  x 7 days then resume home dose 18 u after that, continue Novolog  12 units TID x 7 days then 6 u tid next 7 dasy and stop cont Metformin  1000 BID. After 7 days, Once steroids complete, RESUME his home doses os lantus  until he follows back with PCP. Recent Labs  Lab 09/13/23 1049 09/13/23 1618 09/13/23 2146 09/14/23 0652 09/14/23 1104  GLUCAP 222* 259* 185* 116* 225*   Discharge  Exam: Vitals:   09/14/23 0757 09/14/23 1109  BP: 107/81 122/64  Pulse: 83 84  Resp: 18 18  Temp:  97.8 F (36.6 C)  SpO2: 92% 91%   General: Pt is alert, awake, not in acute distress Cardiovascular: RRR, S1/S2 +, no rubs, no gallops Respiratory: CTA bilaterally, no wheezing, no rhonchi Abdominal: Soft, NT, ND, bowel sounds + Extremities: no edema, no cyanosis  Discharge Instructions  Discharge Instructions     Discharge instructions   Complete by: As directed    Please call call MD or return to ER for similar or worsening recurring problem that brought you to hospital or if any fever,nausea/vomiting,abdominal pain, uncontrolled pain, chest pain,  shortness of breath or any other alarming symptoms.  Please follow-up your doctor as instructed in a week time and call the office for appointment.  Please avoid alcohol, smoking, or any other illicit substance and maintain healthy habits including taking your regular medications as prescribed.  You were cared for by a hospitalist during your hospital stay. If you have any questions about your discharge medications or the care you received while you were in the hospital after you are discharged, you can call the unit and ask to speak with the hospitalist on call if the hospitalist that took care of you is not available.  Once you are discharged, your primary care physician will handle any further medical issues. Please note that NO REFILLS for any discharge medications will be authorized once you are discharged, as it is imperative that you return to your primary care physician (or establish a relationship with a primary care physician if you do not have one) for your aftercare needs so that they can reassess your need for medications and monitor your lab values  Check blood sugar 3 times a day and bedtime at home. If blood sugar running above 200 less than 70 please call your MD to adjust insulin . If blood sugars running less 100 do not  use insulin  and call MD. If you noticed signs and symptoms of hypoglycemia or low blood sugar like jitteriness, confusion, thirst, tremor, sweating- Check blood sugar, drink sugary drink/biscuits/sweets to increase sugar level and call MD or return to ER.   Increase activity slowly   Complete by: As directed       Allergies as of 09/14/2023       Reactions   Empagliflozin  Other (See Comments)   Acidosis        Medication List     STOP taking these medications    losartan  50 MG tablet Commonly known as: COZAAR        TAKE these medications    acetaminophen  325 MG tablet Commonly known as: TYLENOL  Take 650 mg by mouth every 6 (six) hours as needed for mild pain.   ARIPiprazole  2 MG tablet Commonly known as: ABILIFY  Take 2 mg by mouth daily.   Blood Glucose Monitoring Suppl Devi 1 each by Does not apply route 3 (three) times daily. May dispense any manufacturer covered by patient's insurance.   BLOOD GLUCOSE TEST  STRIPS Strp 1 each by Does not apply route 3 (three) times daily. Use as directed to check blood sugar. May dispense any manufacturer covered by patient's insurance and fits patient's device.   cyanocobalamin 500 MCG tablet Commonly known as: VITAMIN B12 Take 500 mcg by mouth daily.   FLUoxetine  20 MG capsule Commonly known as: PROZAC  Take 20 mg by mouth 2 (two) times daily as needed (anxiety/depression).   furosemide  40 MG tablet Commonly known as: LASIX  Take 1 tablet (40 mg total) by mouth daily.   insulin  aspart 100 UNIT/ML FlexPen Commonly known as: NOVOLOG  If eating and Blood Glucose (BG) 80 or higher: First 7 days inject 12 units three times before meal Then next 7 days inject  6 units thee times before meal then stop.   insulin  glargine 100 UNIT/ML Solostar Pen Commonly known as: LANTUS  36 units (0.36 ml)  [place] daily x 7 days After 7 days 25 units (0.25 ml)  daily x 7 days  Then resume your previous dose of 18 u daily  May substitute as  needed per insurance.   Lancet Device Misc 1 each by Does not apply route 3 (three) times daily. May dispense any manufacturer covered by patient's insurance.   Lancets Misc 1 each by Does not apply route 3 (three) times daily. Use as directed to check blood sugar. May dispense any manufacturer covered by patient's insurance and fits patient's device.   metFORMIN  500 MG tablet Commonly known as: GLUCOPHAGE  Take 1,000 mg by mouth 2 (two) times daily with a meal.   metoprolol  tartrate 25 MG tablet Commonly known as: LOPRESSOR  Take 1 tablet (25 mg total) by mouth 2 (two) times daily.   mupirocin  ointment 2 % Commonly known as: BACTROBAN  Place 1 application into the nose 2 (two) times daily.   pantoprazole  40 MG tablet Commonly known as: PROTONIX  Take 1 tablet (40 mg total) by mouth daily.   Pen Needles 31G X 5 MM Misc 1 each by Does not apply route 3 (three) times daily. May dispense any manufacturer covered by patient's insurance.   polyethylene glycol 17 g packet Commonly known as: MIRALAX  / GLYCOLAX  Take 17 g by mouth daily.   predniSONE 10 MG tablet Commonly known as: DELTASONE Take 2 tablets (20 mg total) by mouth daily with breakfast for 7 days, THEN 1 tablet (10 mg total) daily with breakfast for 7 days. Start taking on: Sep 14, 2023   rivaroxaban  20 MG Tabs tablet Commonly known as: XARELTO  Take 20 mg by mouth daily with supper.   rosuvastatin 20 MG tablet Commonly known as: CRESTOR Take 10 mg by mouth daily.   Semaglutide (2 MG/DOSE) 8 MG/3ML Sopn Inject 2 mg into the skin every 7 (seven) days.   senna 8.6 MG Tabs tablet Commonly known as: SENOKOT Take 1 tablet (8.6 mg total) by mouth at bedtime.               Durable Medical Equipment  (From admission, onward)           Start     Ordered   09/12/23 1320  For home use only DME oxygen  Once       Comments: 4l [place] on ambulation , 4L [place] at rest  Question Answer Comment  Length of Need Lifetime    Mode or (Route) Nasal cannula   Liters per Minute 3   Frequency Continuous (stationary and portable oxygen unit needed)   Oxygen delivery system Gas  09/12/23 1320            Follow-up Information     [facility] Follow up.   Why: home oxygen Contact information: [phone]        [facility] Follow up.   Specialty: Home Health Services Why: Agency will call you to set up apt times Contact information: [address] [place] [address] [phone]         [name] Follow up in 1 week(s).   Why: Follow-up with PCP to monitor your blood sugar and adjust insulin  regimen as you come off steroid. Contact information: [address] [facility] [place] [address] [phone]                Allergies  Allergen Reactions   Empagliflozin  Other (See Comments)    Acidosis    The results of significant diagnostics from this hospitalization (including imaging, microbiology, ancillary and laboratory) are listed below for reference.    Microbiology: No results found for this or any previous visit (from the past 240 hours).  Procedures/Studies: IR US  CHEST Result Date: 09/09/2023 CLINICAL DATA:  Limited ultrasound of the chest prior to possible thoracentesis. EXAM: CHEST ULTRASOUND COMPARISON:  CT scan of the chest done 08/31/2023 FINDINGS: There is no pleural fluid seen on the right. There is consolidated lung visualized IMPRESSION: No pleural fluid.  Thoracentesis not performed. Ultrasound performed by Nicky Barrack, PA-C Electronically Signed   By: Erica Hau M.D.   On: 09/09/2023 09:21   DG CHEST PORT 1 VIEW Result Date: 09/07/2023 CLINICAL DATA:  Hypoxemia EXAM: PORTABLE CHEST 1 VIEW COMPARISON:  Chest x-ray 09/03/2023 FINDINGS: Bilateral airspace disease, left greater than right, has not significantly changed. There is a the a small right pleural effusion. There is no pneumothorax. The cardiomediastinal  silhouette is within normal limits. No acute fractures are seen. IMPRESSION: 1. Stable bilateral airspace disease. 2. Small right pleural effusion. Electronically Signed   By: Tyron Gallon M.D.   On: 09/07/2023 15:52   DG CHEST PORT 1 VIEW Result Date: 09/03/2023 CLINICAL DATA:  Shortness of breath.  History of aspiration EXAM: PORTABLE CHEST 1 VIEW COMPARISON:  08/29/2023 FINDINGS: Patient rotated to the right. Normal heart size. No pleural effusion or pneumothorax. Interstitial and airspace disease again identified bilaterally. Relatively similar in the right upper lobe and right lung base. Felt to be slightly increased in the left mid and upper lung. IMPRESSION: Similar right and progressive left-sided multifocal pneumonia. Electronically Signed   By: Lore Rode M.D.   On: 09/03/2023 12:44   ECHOCARDIOGRAM COMPLETE Result Date: 08/31/2023    ECHOCARDIOGRAM REPORT   Patient Name:   MICK TANGUMA [facility] Date of Exam: 08/31/2023 Medical Rec #:  [id number]          Height:       73.0 in Accession #:    [id number]         Weight:       229.3 lb Date of Birth:  [birth year]          BSA:          2.280 m Patient Age:    62 years           BP:           131/72 mmHg Patient Gender: M                  HR:  94 bpm. Exam Location:  Inpatient Procedure: 2D Echo, Cardiac Doppler and Color Doppler (Both Spectral and Color            Flow Doppler were utilized during procedure). Indications:    other abnormalities of the heart R00.8  History:        Patient has prior history of Echocardiogram examinations, most                 recent 09/07/2022. Risk Factors:Hypertension, Diabetes and                 Dyslipidemia.  Sonographer:    Terrilee Few RCS Referring Phys: [phone] A CALDWELL POWELL JR IMPRESSIONS  1. Left ventricular ejection fraction, by estimation, is 60 to 65%. The left ventricle has normal function. The left ventricle has no regional wall motion abnormalities. There is mild left ventricular hypertrophy  of the basal-septal segment. Left ventricular diastolic parameters were normal.  2. Right ventricular systolic function is normal. The right ventricular size is normal. Tricuspid regurgitation signal is inadequate for assessing PA pressure.  3. The mitral valve is normal in structure. No evidence of mitral valve regurgitation. No evidence of mitral stenosis.  4. The aortic valve is tricuspid. Aortic valve regurgitation is not visualized. Aortic valve sclerosis/calcification is present, without any evidence of aortic stenosis. Aortic valve Vmax measures 1.48 m/s. FINDINGS  Left Ventricle: Left ventricular ejection fraction, by estimation, is 60 to 65%. The left ventricle has normal function. The left ventricle has no regional wall motion abnormalities. The left ventricular internal cavity size was normal in size. There is  mild left ventricular hypertrophy of the basal-septal segment. Left ventricular diastolic parameters were normal. Normal left ventricular filling pressure. Right Ventricle: The right ventricular size is normal. No increase in right ventricular wall thickness. Right ventricular systolic function is normal. Tricuspid regurgitation signal is inadequate for assessing PA pressure. Left Atrium: Left atrial size was normal in size. Right Atrium: Right atrial size was normal in size. Pericardium: There is no evidence of pericardial effusion. Mitral Valve: The mitral valve is normal in structure. No evidence of mitral valve regurgitation. No evidence of mitral valve stenosis. Tricuspid Valve: The tricuspid valve is normal in structure. Tricuspid valve regurgitation is not demonstrated. No evidence of tricuspid stenosis. Aortic Valve: The aortic valve is tricuspid. Aortic valve regurgitation is not visualized. Aortic valve sclerosis/calcification is present, without any evidence of aortic stenosis. Aortic valve peak gradient measures 8.8 mmHg. Pulmonic Valve: The pulmonic valve was normal in structure.  Pulmonic valve regurgitation is not visualized. No evidence of pulmonic stenosis. Aorta: The aortic root is normal in size and structure. Venous: The inferior vena cava was not well visualized. IAS/Shunts: No atrial level shunt detected by color flow Doppler.  LEFT VENTRICLE PLAX 2D LVIDd:         4.00 cm   Diastology LVIDs:         2.70 cm   LV e' medial:    8.16 cm/s LV PW:         1.10 cm   LV E/e' medial:  8.0 LV IVS:        1.20 cm   LV e' lateral:   11.10 cm/s LVOT diam:     2.40 cm   LV E/e' lateral: 5.9 LV SV:         65 LV SV Index:   28 LVOT Area:     4.52 cm  RIGHT VENTRICLE RV S prime:  14.80 cm/s TAPSE (M-mode): 2.5 cm LEFT ATRIUM             Index        RIGHT ATRIUM           Index LA diam:        3.70 cm 1.62 cm/m   RA Area:     13.20 cm LA Vol (A2C):   57.4 ml 25.17 ml/m  RA Volume:   25.95 ml  11.38 ml/m LA Vol (A4C):   69.4 ml 30.44 ml/m LA Biplane Vol: 67.9 ml 29.78 ml/m  AORTIC VALVE AV Area (Vmax): 2.44 cm AV Vmax:        148.00 cm/s AV Peak Grad:   8.8 mmHg LVOT Vmax:      79.70 cm/s LVOT Vmean:     53.500 cm/s LVOT VTI:       0.143 m  AORTA Ao Root diam: 3.50 cm Ao Asc diam:  3.30 cm MITRAL VALVE MV Area (PHT): 4.86 cm    SHUNTS MV Decel Time: 156 msec    Systemic VTI:  0.14 m MV E velocity: 65.50 cm/s  Systemic Diam: 2.40 cm MV A velocity: 81.80 cm/s MV E/A ratio:  0.80 Gaylyn Keas MD Electronically signed by Gaylyn Keas MD Signature Date/Time: 08/31/2023/5:18:13 PM    Final    CT CHEST W CONTRAST Result Date: 08/31/2023 CLINICAL DATA:  Multifocal pneumonia EXAM: CT CHEST WITH CONTRAST TECHNIQUE: Multidetector CT imaging of the chest was performed during intravenous contrast administration. RADIATION DOSE REDUCTION: This exam was performed according to the departmental dose-optimization program which includes automated exposure control, adjustment of the mA and/or kV according to patient size and/or use of iterative reconstruction technique. CONTRAST:  50mL OMNIPAQUE  IOHEXOL   350 MG/ML SOLN COMPARISON:  Chest radiograph dated 08/29/2023, CT chest dated 09/10/2022 FINDINGS: Decreased sensitivity and specificity for detailed findings due to motion artifact. Cardiovascular: Normal heart size. No significant pericardial fluid/thickening. Great vessels are normal in course and caliber. No central pulmonary emboli. Coronary artery calcifications and aortic atherosclerosis. Mediastinum/Nodes: Imaged thyroid gland without nodules meeting criteria for imaging follow-up by size. Mild circumferential mural thickening of the esophagus. 12 mm right hilar lymph node (3:55) and 11 mm left hilar lymph node (3:56). Lungs/Pleura: The central airways are patent. Moderate diffuse bronchial wall thickening. Diffuse, multifocal ground-glass opacities and irregular consolidations. No pneumothorax. Trace bilateral pleural effusions. Upper abdomen: Mildly enlarged spleen measures 13.9 cm. Musculoskeletal: No acute or abnormal lytic or blastic osseous lesions. IMPRESSION: 1. Diffuse, multifocal ground-glass opacities and irregular consolidations, likely multifocal pneumonia. 2. Trace bilateral pleural effusions. 3. Mild circumferential mural thickening of the esophagus, which can be seen in the setting of esophagitis. 4. Mildly enlarged bilateral hilar lymph nodes, likely reactive. 5. Aortic Atherosclerosis (ICD10-I70.0). Coronary artery calcifications. Assessment for potential risk factor modification, dietary therapy or pharmacologic therapy may be warranted, if clinically indicated. Electronically Signed   By: Limin  Xu M.D.   On: 08/31/2023 12:42   CT SOFT TISSUE NECK WO CONTRAST Result Date: 08/30/2023 CLINICAL DATA:  Dysphagia EXAM: CT NECK WITHOUT CONTRAST TECHNIQUE: Multidetector CT imaging of the neck was performed following the standard protocol without intravenous contrast. RADIATION DOSE REDUCTION: This exam was performed according to the departmental dose-optimization program which includes  automated exposure control, adjustment of the mA and/or kV according to patient size and/or use of iterative reconstruction technique. COMPARISON:  None Available. FINDINGS: Pharynx and larynx: Prominent disc osteophytes at C2-3 result in mild impression upon the posterior hypopharynx  without significant luminal compromise. No mass lesion or soft tissue swelling identified. Salivary glands: Thinning of the overlying subcutaneous fat and subdermal infiltration superficial to the left parotid gland in keeping with remote trauma or postsurgical change. The parotid and submandibular salivary glands are unremarkable. Thyroid: Normal. Lymph nodes: None enlarged or abnormal density. Vascular: Mild atherosclerotic calcification within the left carotid bifurcation. Limited intracranial: Negative. Visualized orbits: Right scleral banding. Right ocular lens has been removed. No acute abnormality. Mastoids and visualized paranasal sinuses: Clear. Skeleton: Segmentation anomaly C2-3 with incomplete segmentation. Intervertebral disc space narrowing and endplate remodeling is seen throughout the remainder of the cervical spine in keeping with changes of moderate degenerative disc disease. As noted above, prominent anteriorly oriented disc osteophytes at C2-3 result in mild depression upon the posterior hypopharynx. 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 Milder changes related to posterior disc osteophyte complex ease are seen at C5-6 and C6-7. Uncovertebral arthrosis results in severe bilateral neuroforaminal narrowing at C5-6 and more moderate bilateral neuroforaminal narrowing at C6-7. No acute abnormality. Upper chest: Biapical airspace infiltrate, asymmetrically more severe within the right apex is better seen on prior CT  examination of the chest and is compatible with multifocal pneumonic infiltrate. Other: None IMPRESSION: 1. Prominent anteriorly oriented disc osteophytes at C2-3 result in mild impression upon the posterior hypopharynx without significant luminal compromise. 2. 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 3. Severe bilateral neuroforaminal narrowing at C5-6 and more moderate bilateral neuroforaminal narrowing at C6-7. 4. Biapical airspace infiltrate, asymmetrically more severe within the right apex is better seen on prior CT examination of the chest and is compatible with multifocal pneumonic infiltrate. 5. Thinning of the overlying subcutaneous fat and subdermal infiltration superficial to the left parotid gland in keeping with remote trauma or postsurgical change. Electronically Signed   By: Worthy Heads M.D.   On: 08/30/2023 23:45   DG Swallowing Func-Speech Pathology Result Date: 08/30/2023 Table formatting from the original result was not included. Images from the original result were not included. Modified Barium Swallow Study Patient Details Name: DEVAL MROCZKA MRN: [id number] Date of Birth: 05-25-1960 Today's Date: 08/30/2023 HPI/PMH: HPI: PHILMORE LEPORE is a 63 y.o. male veteran, with hx of esophageal dysphagia (? Prior cricopharyngeal narrowing), PE on anticoagulation, diabetes type 2, hypertension, hyperlipidemia, NAFLD, depression, cognitive impairment, GERD, who presented after an episode of aspiration.  Reports that around 4 PM he was eating a roast beef sandwich and felt that it was moving slowly through his oropharynx, points at his upper neck.  He started "choking" and coughing.  Drank a soda which felt this helped clear the food and he started breathing better.  He denies any issues with dysphagia prior to today.   Otherwise reports wheezing for the past 3 days although no history of asthma or COPD, smoking.  Denies any recent fever, chills, cough, rhinorrhea, sore throat, sick contacts.  No chest pain or shortness of breath.  Denies any prior treatment for dysphagia. [facility] history of esophageal dysphagia with?  Cricopharyngeal narrowing and 2017. No prior endoscopy to his knowledge. Clinical Impression: Pt presents with a mild-moderate oropharyngeal dysphagia c/b reduced base  of tongue retraction, incomplete laryngeal closure, absent epiglottic inversion with liquid and diminished sensation.  These deficits resulted in deep, silent penetration of thin and nectar thick liquids.  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  Pt was able to acheive full epiglottic inversion with heavier bolus trials (puree, solid).  Pt's cued cough is fairly weak.  Cued cough and secondary swallow were partially beneficial for clearance of penetration.  Pt's cough is weak sounding and lacking crispness.  Chin tuck prevented penetration of thin liquid.  It is difficult to determine acute v chronic nature of pt's current presentation.  Presumed osteophytes are not acute, but it appears pt has been able to compensate for their presence prior to this admission.  And this may explain cricopharyngeal narrowing noted [facility] hx by [facility] in 2017 as in addition to pharyngeal projections at C2-3 there are further presumed osteophytes at the level of the UES.  Bolus was able to pass through and on esophageal sweep there was no retention of contrast.  Would consider consult to address structural issues affecting swallow as pt is relatively young and this will continue to place him at higher risk for aspiration. During pill simulation pill was given whole with puree as compensatory strategies can be difficult to coordinate  with tablet and liquid wash.  Initially pt chewed tablet, but on second attempt was able to swallow tablet whole with puree. Recommend continuing regular texture diet with thin liquid, with use of CHIN TUCK. Administer medications whole with puree. DIGEST Swallow Severity Rating*  Safety: 2  Efficiency: 0  Overall Pharyngeal Swallow Severity: 2 1: mild; 2: moderate; 3: severe; 4: profound *The Dynamic Imaging Grade of Swallowing Toxicity is standardized for the head and neck cancer population, however, demonstrates promising clinical applications across populations to standardize the clinical rating of pharyngeal swallow safety and severity. 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 inversion: Factors that may increase risk of adverse event in presence of aspiration Roderick Civatte & Jessy Morocco 2021): Respiratory or GI disease Swallow Evaluation Recommendations Recommendations: PO diet PO Diet Recommendation: Regular;Thin liquids (Level 0) Liquid Administration via: Cup;Straw Medication Administration: Whole meds with puree Supervision: Intermittent supervision/cueing for swallowing strategies Swallowing strategies  : Chin tuck; Slow rate; Small bites/sips Postural changes: Position pt fully upright for meals Recommended consults:  Consider consult to address structural impairments to swallow (i.e. cervical osteophytes) Treatment Plan Treatment Plan Treatment recommendations: Therapy as outlined in treatment plan below Follow-up recommendations: -- (ST at next level of care) Functional status assessment: Patient has had a recent decline in their functional status and demonstrates the ability to make significant improvements in function in a reasonable and predictable amount of time. Treatment frequency: Min 2x/week Treatment duration: 2 weeks Interventions: Respiratory muscle strength training; Compensatory techniques; Diet toleration management by SLP; Aspiration precaution training Recommendations Recommendations  for follow up therapy are one component of a multi-disciplinary discharge planning process, led by the attending physician.  Recommendations may be updated based on patient status, additional functional criteria and insurance authorization. Assessment: Orofacial Exam: Orofacial Exam Oral Cavity: Oral Hygiene: WFL Oral Cavity - Dentition: Adequate natural dentition Orofacial Anatomy: WFL Oral Motor/Sensory Function: -- (See BSE) Anatomy: Anatomy: Suspected cervical osteophytes Boluses Administered: Boluses Administered Boluses Administered: Thin liquids (Level 0); Mildly thick liquids (Level 2, nectar thick); Moderately thick liquids (Level 3, honey thick); Puree; Solid  Oral Impairment Domain: Oral Impairment  Domain Lip Closure: No labial escape Tongue control during bolus hold: Cohesive bolus between tongue to palatal seal Bolus preparation/mastication: Timely and efficient chewing and mashing Bolus transport/lingual motion: Brisk tongue motion Oral residue: Complete oral clearance Location of oral residue : N/A Initiation of pharyngeal swallow : Posterior angle of the ramus  Pharyngeal Impairment Domain: Pharyngeal Impairment Domain Soft palate elevation: No bolus between soft palate (SP)/pharyngeal wall (PW) Laryngeal elevation: Complete superior movement of thyroid cartilage with complete approximation of arytenoids to epiglottic petiole Anterior hyoid excursion: Complete anterior movement Epiglottic movement: No inversion (With liquids.  Complete inversion with solids) Pharyngeal stripping wave : Present - diminished Pharyngeal contraction (A/P view only): N/A Pharyngoesophageal segment opening: Partial distention/partial duration, partial obstruction of flow Tongue base retraction: Trace column of contrast or air between tongue base and PPW Pharyngeal residue: Collection of residue within or on pharyngeal structures Location of pharyngeal residue: Valleculae  Esophageal Impairment Domain: Esophageal Impairment  Domain Esophageal clearance upright position: Complete clearance, esophageal coating Pill: Pill Consistency administered: Puree Puree: WFL Penetration/Aspiration Scale Score: Penetration/Aspiration Scale Score 1.  Material does not enter airway: Moderately thick liquids (Level 3, honey thick); Puree; Solid; Pill 3.  Material enters airway, remains ABOVE vocal cords and not ejected out: Thin liquids (Level 0); Mildly thick liquids (Level 2, nectar thick) Compensatory Strategies: Compensatory Strategies Compensatory strategies: Yes Chin tuck: Effective Effective Chin Tuck: Thin liquid (Level 0) Other(comment): -- (Cued cough and re-swallow, partially effective)   General Information: Caregiver present: No  Diet Prior to this Study: Regular; Thin liquids (Level 0)   No data recorded  Respiratory Status: WFL   Supplemental O2: None (Room air) (Despite requiring 10L by HFNC at bedside)   No data recorded Behavior/Cognition: Alert; Cooperative Self-Feeding Abilities: Able to self-feed Baseline vocal quality/speech: Normal Volitional Cough: Able to elicit No data recorded Exam Limitations: No limitations Goal Planning: Prognosis for improved oropharyngeal function: Fair Barriers to Reach Goals: -- (Structural changes affecting swallow function) No data recorded No data recorded Consulted and agree with results and recommendations: Patient; Nurse Pain: Pain Assessment Faces Pain Scale: 0 Breathing: 0 End of Session: Start Time:SLP Start Time (ACUTE ONLY): 0919 Stop Time: SLP Stop Time (ACUTE ONLY): 0926 Time Calculation:SLP Time Calculation (min) (ACUTE ONLY): 7 min Charges: SLP Evaluations $ SLP Speech Visit: 1 Visit SLP Evaluations $Swallowing Treatment: 1 Procedure SLP visit diagnosis: SLP Visit Diagnosis: Dysphagia, oropharyngeal phase (R13.12) Past Medical History: Past Medical History: Diagnosis Date  Balanitis 12/24/2015  Cellulitis and abscess 12/24/2015  Generalized anxiety disorder 09/06/2022  Hyperlipidemia  09/06/2022  Hypertension   IBS (irritable bowel syndrome)   Major depressive disorder 09/06/2022  Mild cognitive disorder 09/06/2022  Dec 07, 2016 Entered By: Rosalita Combe Comment: Medical Disabilty 12.2017 -Jul 10, 2018 Entered By: Aloma Jaksch Comment: diagnosed at [facility]  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  Non-alcoholic fatty liver disease 04/19/2011  Dec 07, 2016 Entered By: Rosalita Combe Comment: Urged to modify lifestyle &amp; Urged again 2018 to begin Coffee 2-4/d for hepatoprotection  Pneumonia 2014-2015 X 1  Pulmonary embolism (HCC) 03/21/2013  Sepsis (HCC) 11/11/2015  Type II diabetes mellitus (HCC)  Past Surgical History: Past Surgical History: Procedure Laterality Date  TEE WITHOUT CARDIOVERSION N/A 11/17/2015  Procedure: TRANSESOPHAGEAL ECHOCARDIOGRAM (TEE);  Surgeon: Maudine Sos, MD;  Location[facility] ENDOSCOPY;  Service: Cardiovascular;  Laterality: N/A;  TONSILLECTOMY  ~ 1977 Elester Grim, MA, CCC-SLP Acute [facility] Services Office: [phone] 08/30/2023, 11:24 AM  DG Chest 2 View Result Date: 08/29/2023 CLINICAL DATA:  Pneumonia EXAM: CHEST - 2 VIEW COMPARISON:  Aug 27, 2023 FINDINGS: Persistent right upper lobe consolidating parenchymal changes correlate with pneumonia. With interval improvement in the previously described infiltrates of the left lung. Heart and mediastinum normal No pleural effusions IMPRESSION: Improving left lung pneumonia persistent right upper lobe pneumonia Electronically Signed   By: Fredrich Jefferson M.D.   On: 08/29/2023 07:33   DG CHEST PORT 1 VIEW Result Date: 08/27/2023 CLINICAL DATA:  Pneumonia EXAM: PORTABLE CHEST 1 VIEW COMPARISON:  Three days prior FINDINGS: Patchy bilateral airspace opacity that has worsened, density greatest at the left base and right upper lobe. Normal heart size and mediastinal contours. No effusion or pneumothorax. IMPRESSION: Worsening bilateral pneumonia. Electronically Signed   By: Ronnette Coke M.D.   On: 08/27/2023 08:19    DG ESOPHAGUS W SINGLE CM (SOL OR THIN BA) Result Date: 08/25/2023 CLINICAL DATA:  Patient is a 63 y/o male with dysphagia and concern for aspiration. Patient presents for a single contrast esophagram. EXAM: ESOPHAGUS/BARIUM SWALLOW/TABLET STUDY TECHNIQUE: Single contrast examination was performed using thin liquid barium. This exam was performed by [facility] and was supervised and interpreted by Dr. Baldemar Lev FLUOROSCOPY: Radiation Exposure Index (as provided by the fluoroscopic device): 5.9 mGy Kerma COMPARISON:  None Available. FINDINGS: Swallowing: Probable penetration present.  No aspiration visualized. Pharynx: Unremarkable. Esophagus: Normal appearance. Esophageal motility: No evidence of dysmotility. Hiatal Hernia: None visualized Gastroesophageal reflux: None visualized. Ingested 13 mm barium tablet: Not given. Other: None. IMPRESSION: Single contrast esophagram. Study was limited by patient positioning. Electronically Signed   By: Fernando Hoyer M.D.   On: 08/25/2023 14:03   DG Chest Port 1 View Result Date: 08/24/2023 CLINICAL DATA:  Shortness of breath, choking. EXAM: PORTABLE CHEST 1 VIEW COMPARISON:  Chest x-ray 09/08/2022 FINDINGS: There are mild patchy and ill-defined nodular densities in the mid right lung and left mid and lower lung. The costophrenic angles are clear. There is no pneumothorax. The cardiomediastinal silhouette is within normal limits. No acute fractures are seen. IMPRESSION: Mild patchy and ill-defined nodular densities in the mid right lung and left mid and lower lung, suspicious for multifocal pneumonia. Recommend follow-up PA and lateral chest x-ray in 4-6 weeks to confirm resolution. Electronically Signed   By: Tyron Gallon M.D.   On: 08/24/2023 18:30    Labs: BNP (last 3 results) Recent Labs    08/24/23 1745 08/27/23 1105 08/30/23 0737  BNP 10.7 76.5 21.1   Basic Metabolic Panel: Recent Labs  Lab 09/09/23 0308  NA 133*  K 3.4*  CL 90*   CO2 32  GLUCOSE 287*  BUN 25*  CREATININE 0.67  CALCIUM 9.4  MG 2.1   Liver Function Tests: Recent Labs  Lab 09/09/23 0308  AST 22  ALT 49*  ALKPHOS 61  BILITOT 0.7  PROT 7.0  ALBUMIN 3.1*   No results for input(s): "LIPASE", "AMYLASE" in the last 168 hours. No results for input(s): "AMMONIA" in the last 168 hours. CBC: Recent Labs  Lab 09/09/23 0308  WBC 15.1*  NEUTROABS 9.7*  HGB 14.9  HCT 43.9  MCV 91.3  PLT 320   Recent Labs  Lab 09/13/23 1049 09/13/23 1618 09/13/23 2146 09/14/23 0652 09/14/23 1104  GLUCAP 222* 259* 185* 116* 225*  Urinalysis    Component Value Date/Time   COLORURINE STRAW (A) 09/05/2022 2327   APPEARANCEUR CLEAR 09/05/2022 2327   LABSPEC 1.033 (H) 09/05/2022 2327   PHURINE 5.0 09/05/2022 2327   GLUCOSEU >=500 (A)  09/05/2022 2327   HGBUR MODERATE (A) 09/05/2022 2327   BILIRUBINUR NEGATIVE 09/05/2022 2327   KETONESUR 20 (A) 09/05/2022 2327   PROTEINUR 30 (A) 09/05/2022 2327   NITRITE NEGATIVE 09/05/2022 2327   LEUKOCYTESUR NEGATIVE 09/05/2022 2327   Sepsis Labs Recent Labs  Lab 09/09/23 0308  WBC 15.1*   Microbiology No results found for this or any previous visit (from the past 240 hours).  Time coordinating discharge: 35 minutes  SIGNED: Lesa Rape, MD  [facility] 09/14/2023, 1:03 PM  If 7PM-7AM, please contact night-coverage [email or web address]

## 2023-09-15 ENCOUNTER — Emergency Department (HOSPITAL_COMMUNITY)

## 2023-09-15 ENCOUNTER — Other Ambulatory Visit: Payer: Self-pay

## 2023-09-15 ENCOUNTER — Encounter (HOSPITAL_COMMUNITY): Payer: Self-pay

## 2023-09-15 ENCOUNTER — Emergency Department (HOSPITAL_COMMUNITY)
Admission: EM | Admit: 2023-09-15 | Discharge: 2023-09-15 | Disposition: A | Discharge status: Home / Self Care | Attending: Emergency Medicine | Admitting: Emergency Medicine

## 2023-09-15 DIAGNOSIS — Z79899 Other long term (current) drug therapy: Secondary | ICD-10-CM | POA: Insufficient documentation

## 2023-09-15 DIAGNOSIS — Z7984 Long term (current) use of oral hypoglycemic drugs: Secondary | ICD-10-CM | POA: Insufficient documentation

## 2023-09-15 DIAGNOSIS — I10 Essential (primary) hypertension: Secondary | ICD-10-CM | POA: Insufficient documentation

## 2023-09-15 DIAGNOSIS — R410 Disorientation, unspecified: Secondary | ICD-10-CM | POA: Insufficient documentation

## 2023-09-15 DIAGNOSIS — Z7901 Long term (current) use of anticoagulants: Secondary | ICD-10-CM | POA: Insufficient documentation

## 2023-09-15 DIAGNOSIS — E1142 Type 2 diabetes mellitus with diabetic polyneuropathy: Secondary | ICD-10-CM | POA: Insufficient documentation

## 2023-09-15 DIAGNOSIS — I110 Hypertensive heart disease with heart failure: Secondary | ICD-10-CM | POA: Diagnosis not present

## 2023-09-15 DIAGNOSIS — J9601 Acute respiratory failure with hypoxia: Secondary | ICD-10-CM | POA: Diagnosis not present

## 2023-09-15 DIAGNOSIS — Z794 Long term (current) use of insulin: Secondary | ICD-10-CM | POA: Insufficient documentation

## 2023-09-15 LAB — ETHANOL: Alcohol, Ethyl (B): <15 mg/dL (ref <15)

## 2023-09-15 LAB — COMPREHENSIVE METABOLIC PANEL WITH GFR
ALT: 38 U/L (ref 0–44)
AST: 22 U/L (ref 15–41)
Albumin: 3.6 g/dL (ref 3.5–5.0)
Alkaline Phosphatase: 69 U/L (ref 38–126)
Anion gap: 9 (ref 5–15)
BUN: 18 mg/dL (ref 6–20)
CO2: 31 mmol/L (ref 22–32)
Calcium: 9 mg/dL (ref 8.9–10.3)
Chloride: 95 mmol/L — ABNORMAL LOW (ref 98–111)
Creatinine, Ser: 0.73 mg/dL (ref 0.61–1.24)
GFR, Estimated: >60 mL/min (ref >60)
Glucose, Bld: 200 mg/dL — ABNORMAL HIGH (ref 70–99)
Potassium: 3.5 mmol/L (ref 3.5–5.1)
Sodium: 135 mmol/L (ref 135–145)
Total Bilirubin: 1 mg/dL (ref 0.0–1.2)
Total Protein: 7.2 g/dL (ref 6.5–8.1)

## 2023-09-15 LAB — RAPID URINE DRUG SCREEN, HOSP PERFORMED
Amphetamines: NOT DETECTED
Barbiturates: NOT DETECTED
Benzodiazepines: NOT DETECTED
Cocaine: NOT DETECTED
Opiates: NOT DETECTED
Tetrahydrocannabinol: NOT DETECTED

## 2023-09-15 LAB — URINALYSIS, W/ REFLEX TO CULTURE (INFECTION SUSPECTED)
Bacteria, UA: NONE SEEN
Bilirubin Urine: NEGATIVE
Glucose, UA: 50 mg/dL — AB
Hgb urine dipstick: NEGATIVE
Ketones, ur: NEGATIVE mg/dL
Leukocytes,Ua: NEGATIVE
Nitrite: NEGATIVE
Protein, ur: NEGATIVE mg/dL
Specific Gravity, Urine: 1.006 (ref 1.005–1.030)
pH: 6 (ref 5.0–8.0)

## 2023-09-15 LAB — LIPASE, BLOOD: Lipase: 48 U/L (ref 11–51)

## 2023-09-15 LAB — CBC WITH DIFFERENTIAL/PLATELET
Abs Immature Granulocytes: 0.1 10*3/uL — ABNORMAL HIGH (ref 0.00–0.07)
Basophils Absolute: 0.1 10*3/uL (ref 0.0–0.1)
Basophils Relative: 0 %
Eosinophils Absolute: 0.1 10*3/uL (ref 0.0–0.5)
Eosinophils Relative: 1 %
HCT: 46.9 % (ref 39.0–52.0)
Hemoglobin: 15.5 g/dL (ref 13.0–17.0)
Immature Granulocytes: 1 %
Lymphocytes Relative: 30 %
Lymphs Abs: 4.3 10*3/uL — ABNORMAL HIGH (ref 0.7–4.0)
MCH: 30.9 pg (ref 26.0–34.0)
MCHC: 33 g/dL (ref 30.0–36.0)
MCV: 93.4 fL (ref 80.0–100.0)
Monocytes Absolute: 1.1 10*3/uL — ABNORMAL HIGH (ref 0.1–1.0)
Monocytes Relative: 8 %
Neutro Abs: 8.5 10*3/uL — ABNORMAL HIGH (ref 1.7–7.7)
Neutrophils Relative %: 60 %
Platelets: 195 10*3/uL (ref 150–400)
RBC: 5.02 MIL/uL (ref 4.22–5.81)
RDW: 13.2 % (ref 11.5–15.5)
WBC: 14.2 10*3/uL — ABNORMAL HIGH (ref 4.0–10.5)
nRBC: 0 % (ref 0.0–0.2)

## 2023-09-15 LAB — BLOOD GAS, VENOUS
Acid-Base Excess: 13.8 mmol/L — ABNORMAL HIGH (ref 0.0–2.0)
Bicarbonate: 41 mmol/L — ABNORMAL HIGH (ref 20.0–28.0)
O2 Saturation: 48.4 %
Patient temperature: 37
pCO2, Ven: 59 mmHg (ref 44–60)
pH, Ven: 7.45 — ABNORMAL HIGH (ref 7.25–7.43)
pO2, Ven: <31 mmHg — CL (ref 32–45)

## 2023-09-15 LAB — AMMONIA: Ammonia: 22 umol/L (ref 9–35)

## 2023-09-15 LAB — TSH: TSH: 0.68 u[IU]/mL (ref 0.350–4.500)

## 2023-09-15 MED ORDER — SODIUM CHLORIDE 0.9 % IV BOLUS
1000.0000 mL | Freq: Once | INTRAVENOUS | Status: AC
Start: 2023-09-15 — End: 2023-09-15
  Administered 2023-09-15: 1000 mL via INTRAVENOUS

## 2023-09-15 NOTE — ED Provider Notes (Signed)
 [place] EMERGENCY DEPARTMENT AT [facility] Provider Note  CSN: [id number] Arrival date & time: 09/15/23 8295  Chief Complaint(s) Fatigue and Altered Mental Status  HPI Bradley Mcclure is a 63 y.o. male history of hypertension, lipidemia, diabetes, recent admission for aspiration presenting to the emergency department with confusion.  Patient reports that he feels confused about his medications.  He reports that he is confused about the insulin .  Patient was brought in by paramedics, when I asked patient how he arrived to the emergency department, he did report that he came by ambulance, I asked him if he called the paramedics, he reports that he did not but he lives alone.  I asked him if he knew who called the ambulance and he did not.  He denies any specific complaints such as shortness of breath, chest pain, headache, abdominal pain, nausea, vomiting, lightheadedness or dizziness.  Has not been using oxygen at home but is not hypoxic.  Past Medical History Past Medical History:  Diagnosis Date   Balanitis 12/24/2015   Cellulitis and abscess 12/24/2015   Generalized anxiety disorder 09/06/2022   Hyperlipidemia 09/06/2022   Hypertension    IBS (irritable bowel syndrome)    Major depressive disorder 09/06/2022   Mild cognitive disorder 09/06/2022   Dec 07, 2016 Entered By: Rosalita Combe Comment: Medical Disabilty 12.2017 -Jul 10, 2018 Entered By: Aloma Jaksch Comment: diagnosed at [facility]   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   Non-alcoholic fatty liver disease 04/19/2011   Dec 07, 2016 Entered By: Rosalita Combe Comment: Urged to modify lifestyle &amp; Urged again 2018 to begin Coffee 2-4/d for hepatoprotection   Pneumonia 2014-2015 X 1   Pulmonary embolism (HCC) 03/21/2013   Sepsis (HCC) 11/11/2015   Type II diabetes mellitus (HCC)    Patient Active Problem List   Diagnosis Date Noted   Aspiration pneumonitis (HCC) 08/30/2023   Aspiration into airway  08/24/2023   SIRS (systemic inflammatory response syndrome) (HCC) 08/24/2023   Oropharyngeal dysphagia 08/24/2023   Acute hypoxic respiratory failure (HCC) 08/24/2023   Multifocal pneumonia 09/06/2022   Hyperlipidemia 09/06/2022   Generalized anxiety disorder 09/06/2022   Major depressive disorder 09/06/2022   Type 2 diabetes mellitus with diabetic neuropathy, unspecified (HCC) 09/06/2022   Mild cognitive disorder 09/06/2022   History of retinal detachment 09/06/2022   Essential hypertension 09/06/2022   Type 2 diabetes mellitus (HCC) 09/06/2022   Sepsis due to Staphylococcus aureus (HCC) 03/01/2016   Cellulitis of left lower extremity    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   Cellulitis and abscess 12/24/2015   Balanitis 12/24/2015   Uncontrolled type 2 diabetes mellitus with hyperglycemia, with long-term current use of insulin  (HCC)    Bacteremia    Morbid obesity due to excess calories (HCC)    Cellulitis of abdominal wall 11/11/2015   Sepsis (HCC) 11/11/2015   Rash 11/11/2015   Hypertension    Diabetes mellitus without complication (HCC)    Candida-induced panniculitis    GERD (gastroesophageal reflux disease) 04/19/2015   Pulmonary embolism (HCC) 03/21/2013   Non-alcoholic fatty liver disease 04/19/2011   Home Medication(s) Prior to Admission medications   Medication Sig Start Date End Date Taking? Authorizing Provider  Accu-Chek Softclix Lancets lancets 1 each by Other route 3 (three) times daily. Use as directed to check blood sugar. May dispense any manufacturer covered by patient's insurance and fits patient's device. 09/14/23   Lesa Rape, MD  acetaminophen  (TYLENOL ) 325 MG tablet Take 650 mg by mouth every 6 (six) hours as needed  for mild pain.     [provider]  ARIPiprazole  (ABILIFY ) 2 MG tablet Take 2 mg by mouth daily. 05/11/22   [provider]  Blood Glucose Monitoring Suppl (BLOOD GLUCOSE MONITOR SYSTEM) w/Device KIT 1 each by Does not apply route 3  (three) times daily. May dispense any manufacturer covered by patient's insurance. 09/14/23   Lesa Rape, MD  cyanocobalamin  (VITAMIN B12) 500 MCG tablet Take 500 mcg by mouth daily. 08/11/23   [provider]  FLUoxetine  (PROZAC ) 20 MG capsule Take 20 mg by mouth 2 (two) times daily as needed (anxiety/depression). 02/02/22   [provider]  furosemide  (LASIX ) 40 MG tablet Take 1 tablet (40 mg total) by mouth daily. 09/14/23 10/14/23  Lesa Rape, MD  Glucose Blood (BLOOD GLUCOSE TEST STRIPS) STRP 1 each by Does not apply route 3 (three) times daily. Use as directed to check blood sugar. May dispense any manufacturer covered by patient's insurance and fits patient's device. 09/14/23   Lesa Rape, MD  insulin  aspart (NOVOLOG ) 100 UNIT/ML FlexPen If eating and Blood Glucose (BG) 80 or higher: First 7 days inject 12 units three times before meal Then next 7 days inject  6 units thee times before meal then stop. 09/14/23   Lesa Rape, MD  insulin  glargine (LANTUS ) 100 UNIT/ML Solostar Pen 36 units (0.36 ml)  [place] daily x 7 days After 7 days 25 units (0.25 ml)  daily x 7 days  Then resume your previous dose of 18 u daily  May substitute as needed per insurance. 09/14/23   Lesa Rape, MD  Insulin  Pen Needle 32G X 4 MM MISC 1 each by Does not apply route 3 (three) times daily. May dispense any manufacturer covered by patient's insurance. 09/14/23   Lesa Rape, MD  Lancet Device MISC 1 each by Does not apply route 3 (three) times daily. May dispense any manufacturer covered by patient's insurance. 09/14/23   Lesa Rape, MD  metFORMIN  (GLUCOPHAGE ) 500 MG tablet Take 1,000 mg by mouth 2 (two) times daily with a meal.     [provider]  metoprolol  tartrate (LOPRESSOR ) 25 MG tablet Take 1 tablet (25 mg total) by mouth 2 (two) times daily. 09/15/22   Macdonald Savoy, MD  mupirocin  ointment (BACTROBAN ) 2 % Place 1 application into the nose 2 (two) times daily. 12/24/15   Charolette Copier,  MD  pantoprazole  (PROTONIX ) 40 MG tablet Take 1 tablet (40 mg total) by mouth daily. 09/14/23 10/14/23  Lesa Rape, MD  polyethylene glycol (MIRALAX  / GLYCOLAX ) packet Take 17 g by mouth daily. 03/02/16   Leonette Ramal, MD  predniSONE  (DELTASONE ) 10 MG tablet Take 2 tablets (20 mg total) by mouth daily with breakfast for 7 days, THEN 1 tablet (10 mg total) daily with breakfast for 7 days. 09/14/23 09/28/23  Lesa Rape, MD  rivaroxaban  (XARELTO ) 20 MG TABS tablet Take 20 mg by mouth daily with supper.    [provider]  rosuvastatin  (CRESTOR ) 20 MG tablet Take 10 mg by mouth daily. 08/23/23   [provider]  Semaglutide, 2 MG/DOSE, 8 MG/3ML SOPN Inject 2 mg into the skin every 7 (seven) days. 08/29/22   [provider]  Past Surgical History Past Surgical History:  Procedure Laterality Date   TEE WITHOUT CARDIOVERSION N/A 11/17/2015   Procedure: TRANSESOPHAGEAL ECHOCARDIOGRAM (TEE);  Surgeon: Maudine Sos, MD;  Location: [facility] ENDOSCOPY;  Service: Cardiovascular;  Laterality: N/A;   TONSILLECTOMY  ~ 1977   Family History Family History  Problem Relation Age of Onset   Myasthenia gravis Mother    Diabetes type II Brother     Social History Social History   Tobacco Use   Smoking status: Never   Smokeless tobacco: Never  Substance Use Topics   Alcohol use: No   Drug use: No   Allergies Empagliflozin   Review of Systems Review of Systems  All other systems reviewed and are negative.   Physical Exam Vital Signs  I have reviewed the triage vital signs BP (!) 148/62   Pulse 77   Temp 98.1 F (36.7 C)   Resp 16   SpO2 93%  Physical Exam Vitals and nursing note reviewed.  Constitutional:      General: He is not in acute distress.    Appearance: Normal appearance.  HENT:     Mouth/Throat:     Mouth: Mucous membranes are  moist.  Eyes:     Conjunctiva/sclera: Conjunctivae normal.  Cardiovascular:     Rate and Rhythm: Normal rate and regular rhythm.  Pulmonary:     Effort: Pulmonary effort is normal. No respiratory distress.     Breath sounds: Normal breath sounds.  Abdominal:     General: Abdomen is flat.     Palpations: Abdomen is soft.     Tenderness: There is no abdominal tenderness.  Musculoskeletal:     Cervical back: Neck supple.     Right lower leg: No edema.     Left lower leg: No edema.  Skin:    General: Skin is warm and dry.     Capillary Refill: Capillary refill takes less than 2 seconds.  Neurological:     Mental Status: He is alert.     Comments: Patient oriented x 3, but does seem slightly confused as he does not remember calling paramedics.  Per paramedics the patient called EMS himself.  He also has trouble specifically stating how he feels confused beyond saying "I am confused". Cranial nerves II through XII intact, strength 5 out of 5 in the bilateral upper and lower extremities, no sensory deficit to light touch, no dysmetria on finger-nose-finger testing  Psychiatric:        Mood and Affect: Mood normal.        Behavior: Behavior normal.     ED Results and Treatments Labs (all labs ordered are listed, but only abnormal results are displayed) Labs Reviewed  COMPREHENSIVE METABOLIC PANEL WITH GFR - Abnormal; Notable for the following components:      Result Value   Chloride 95 (*)    Glucose, Bld 200 (*)    All other components within normal limits  CBC WITH DIFFERENTIAL/PLATELET - Abnormal; Notable for the following components:   WBC 14.2 (*)    Neutro Abs 8.5 (*)    Lymphs Abs 4.3 (*)    Monocytes Absolute 1.1 (*)    Abs Immature Granulocytes 0.10 (*)    All other components within normal limits  URINALYSIS, W/ REFLEX TO CULTURE (INFECTION SUSPECTED) - Abnormal; Notable for the following components:   Color, Urine STRAW (*)    Glucose, UA 50 (*)    All other  components within normal limits  BLOOD GAS, VENOUS - Abnormal;  Notable for the following components:   pH, Ven 7.45 (*)    pO2, Ven <31 (*)    Bicarbonate 41.0 (*)    Acid-Base Excess 13.8 (*)    All other components within normal limits  LIPASE, BLOOD  ETHANOL  RAPID URINE DRUG SCREEN, HOSP PERFORMED  TSH  AMMONIA                                                                                                                          Radiology MR BRAIN WO CONTRAST Result Date: 09/15/2023 CLINICAL DATA:  Mental status change EXAM: MRI HEAD WITHOUT CONTRAST TECHNIQUE: Multiplanar, multiecho pulse sequences of the brain and surrounding structures were obtained without intravenous contrast. COMPARISON:  CT head earlier same day. FINDINGS: Brain: No acute infarct. No evidence of intracranial hemorrhage. T2/FLAIR hyperintensity in the periventricular and subcortical white matter. Branching susceptibility in the left cerebellum suggestive of developmental venous anomaly. No edema, mass effect, or midline shift. Normal appearance of midline structures. The basilar cisterns are patent. No extra-axial fluid collections. Ventricles: Prominence of the lateral ventricles suggestive of underlying parenchymal volume loss. Vascular: Skull base flow voids are visualized. Skull and upper cervical spine: Degenerative changes in the visualized upper cervical spine. The visualized calvarium is unremarkable. Sinuses/Orbits: Postsurgical changes of the right globe. Orbits otherwise unremarkable. Mucous retention cyst in the right sphenoid sinus. Other: Mastoid air cells are clear. IMPRESSION: No acute intracranial abnormality. Mild-to-moderate chronic microvascular ischemic changes. Mild generalized parenchymal volume loss. 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 Electronically Signed   By: Denny Flack M.D.   On: 09/15/2023 18:59    Pertinent labs & imaging results that were available during my care of  the patient were reviewed by me and considered in my medical decision making (see MDM for details).  Medications Ordered in ED Medications  sodium chloride  0.9 % bolus 1,000 mL (0 mLs Intravenous Stopped 09/15/23 1250)                                                                                                                                     Procedures Procedures  (including critical care time)  Medical Decision Making / ED Course   MDM:  63 year old presenting to the emergency department with confusion.  Patient is overall well-appearing, vital signs are normal, does seem confused but he is oriented with normal neurologic exam.  Differential  includes toxic or metabolic abnormality, will check labs including TSH, ammonia, CMP.  Differentials includes intracranial process, neurologic exam is reassuring, doubt stroke, will check CT head.  Differential also includes infectious process such as persistent pneumonia, urinary infection, will check for these, patient has no abdominal tenderness, meningismus, headache to suggest intra-abdominal infection or CNS infection.  Will reassess.  Clinical Course as of 09/16/23 1515  Fri Sep 15, 2023  1616 Workup so far unremarkable.  Patient still reports that he feels somewhat cloudy headed.  He does have history of stroke.  Will obtain MRI head.  If this is negative, anticipate patient should be stable for discharge and outpatient follow-up, he thinks that he would be able to manage his insulin  at home.  Nursing can discuss this with him further. [WS]  1656 Signed out to Dr. Bryna Car pending MRI result.  [WS]    Clinical Course User Index [WS] Mordecai Applebaum, MD     Additional history obtained: -Additional history obtained from ems -External records from outside source obtained and reviewed including: Chart review including previous notes, labs, imaging, consultation notes including prior d/c summary   Lab Tests: -I ordered,  reviewed, and interpreted labs.   The pertinent results include:   Labs Reviewed  COMPREHENSIVE METABOLIC PANEL WITH GFR - Abnormal; Notable for the following components:      Result Value   Chloride 95 (*)    Glucose, Bld 200 (*)    All other components within normal limits  CBC WITH DIFFERENTIAL/PLATELET - Abnormal; Notable for the following components:   WBC 14.2 (*)    Neutro Abs 8.5 (*)    Lymphs Abs 4.3 (*)    Monocytes Absolute 1.1 (*)    Abs Immature Granulocytes 0.10 (*)    All other components within normal limits  URINALYSIS, W/ REFLEX TO CULTURE (INFECTION SUSPECTED) - Abnormal; Notable for the following components:   Color, Urine STRAW (*)    Glucose, UA 50 (*)    All other components within normal limits  BLOOD GAS, VENOUS - Abnormal; Notable for the following components:   pH, Ven 7.45 (*)    pO2, Ven <31 (*)    Bicarbonate 41.0 (*)    Acid-Base Excess 13.8 (*)    All other components within normal limits  LIPASE, BLOOD  ETHANOL  RAPID URINE DRUG SCREEN, HOSP PERFORMED  TSH  AMMONIA    Notable for normal UA, mild hypergylcemia, nonspecific mild leukocytosis   E    Imaging Studies ordered: I ordered imaging studies including CT head  On my interpretation imaging demonstrates no acute process I independently visualized and interpreted imaging. I agree with the radiologist interpretation   Medicines ordered and prescription drug management: Meds ordered this encounter  Medications   sodium chloride  0.9 % bolus 1,000 mL    -I have reviewed the patients home medicines and have made adjustments as needed Social Determinants of Health:  Diagnosis or treatment significantly limited by social determinants of health: lives alone   Reevaluation: After the interventions noted above, I reevaluated the patient and found that their symptoms have improved  Co morbidities that complicate the patient evaluation  Past Medical History:  Diagnosis Date    Balanitis 12/24/2015   Cellulitis and abscess 12/24/2015   Generalized anxiety disorder 09/06/2022   Hyperlipidemia 09/06/2022   Hypertension    IBS (irritable bowel syndrome)    Major depressive disorder 09/06/2022   Mild cognitive disorder 09/06/2022   Dec 07, 2016 Entered By:  Rosalita Combe Comment: Medical Disabilty 21.3086 -Jul 10, 2018 Entered By: Aloma Jaksch Comment: diagnosed at [facility]   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   Non-alcoholic fatty liver disease 04/19/2011   Dec 07, 2016 Entered By: Rosalita Combe Comment: Urged to modify lifestyle &amp; Urged again 2018 to begin Coffee 2-4/d for hepatoprotection   Pneumonia 2014-2015 X 1   Pulmonary embolism (HCC) 03/21/2013   Sepsis (HCC) 11/11/2015   Type II diabetes mellitus (HCC)       Dispostion: Disposition decision including need for hospitalization was considered, and patient disposition pending at time of sign out.    Final Clinical Impression(s) / ED Diagnoses Final diagnoses:  Confusion     This chart was dictated using voice recognition software.  Despite best efforts to proofread,  errors can occur which can change the documentation meaning.    Mordecai Applebaum, MD 09/16/23 1515

## 2023-09-15 NOTE — Discharge Instructions (Signed)
Follow up with your md next week. °

## 2023-09-15 NOTE — ED Notes (Signed)
 This RN called [employer] [facility] [employer] for patient.

## 2023-09-15 NOTE — ED Triage Notes (Signed)
 EMS reports from home, called out for malaise and confusion. Pt states he choked on a roast beef sandwich on the 8th of May and was admitted for aspiration. DC yesterday.  BP 123/80 HR 95 RR 18 Sp02 95 @ 2 ltrs. CBG 224

## 2023-09-15 NOTE — ED Provider Notes (Signed)
 MRI is unremarkable for any acute events.  Patient would like to be discharged home.  He is instructed to return if any problems and follow-up with his doctor this week   Cheyenne Cotta, MD 09/15/23 1919

## 2023-09-18 ENCOUNTER — Encounter (HOSPITAL_COMMUNITY): Payer: Self-pay

## 2023-09-18 ENCOUNTER — Inpatient Hospital Stay (HOSPITAL_COMMUNITY)
Admission: EM | Admit: 2023-09-18 | Discharge: 2023-09-22 | DRG: 291 | Disposition: A | Discharge status: Home / Self Care | Attending: Internal Medicine | Admitting: Internal Medicine

## 2023-09-18 ENCOUNTER — Other Ambulatory Visit: Payer: Self-pay

## 2023-09-18 ENCOUNTER — Emergency Department (HOSPITAL_COMMUNITY)

## 2023-09-18 DIAGNOSIS — Z7984 Long term (current) use of oral hypoglycemic drugs: Secondary | ICD-10-CM | POA: Diagnosis not present

## 2023-09-18 DIAGNOSIS — K219 Gastro-esophageal reflux disease without esophagitis: Secondary | ICD-10-CM | POA: Diagnosis present

## 2023-09-18 DIAGNOSIS — G3184 Mild cognitive impairment, so stated: Secondary | ICD-10-CM | POA: Diagnosis present

## 2023-09-18 DIAGNOSIS — E785 Hyperlipidemia, unspecified: Secondary | ICD-10-CM | POA: Diagnosis present

## 2023-09-18 DIAGNOSIS — E119 Type 2 diabetes mellitus without complications: Secondary | ICD-10-CM | POA: Diagnosis present

## 2023-09-18 DIAGNOSIS — Z86711 Personal history of pulmonary embolism: Secondary | ICD-10-CM

## 2023-09-18 DIAGNOSIS — I5033 Acute on chronic diastolic (congestive) heart failure: Secondary | ICD-10-CM | POA: Diagnosis present

## 2023-09-18 DIAGNOSIS — Z820 Family history of epilepsy and other diseases of the nervous system: Secondary | ICD-10-CM

## 2023-09-18 DIAGNOSIS — Z7901 Long term (current) use of anticoagulants: Secondary | ICD-10-CM

## 2023-09-18 DIAGNOSIS — J9601 Acute respiratory failure with hypoxia: Secondary | ICD-10-CM | POA: Diagnosis present

## 2023-09-18 DIAGNOSIS — Z79899 Other long term (current) drug therapy: Secondary | ICD-10-CM | POA: Diagnosis not present

## 2023-09-18 DIAGNOSIS — J189 Pneumonia, unspecified organism: Principal | ICD-10-CM | POA: Diagnosis present

## 2023-09-18 DIAGNOSIS — Z794 Long term (current) use of insulin: Secondary | ICD-10-CM

## 2023-09-18 DIAGNOSIS — I2699 Other pulmonary embolism without acute cor pulmonale: Secondary | ICD-10-CM | POA: Diagnosis present

## 2023-09-18 DIAGNOSIS — F332 Major depressive disorder, recurrent severe without psychotic features: Secondary | ICD-10-CM | POA: Insufficient documentation

## 2023-09-18 DIAGNOSIS — Z833 Family history of diabetes mellitus: Secondary | ICD-10-CM | POA: Diagnosis not present

## 2023-09-18 DIAGNOSIS — Z7951 Long term (current) use of inhaled steroids: Secondary | ICD-10-CM

## 2023-09-18 DIAGNOSIS — K589 Irritable bowel syndrome without diarrhea: Secondary | ICD-10-CM | POA: Diagnosis present

## 2023-09-18 DIAGNOSIS — I110 Hypertensive heart disease with heart failure: Principal | ICD-10-CM | POA: Diagnosis present

## 2023-09-18 DIAGNOSIS — J9621 Acute and chronic respiratory failure with hypoxia: Secondary | ICD-10-CM | POA: Diagnosis present

## 2023-09-18 DIAGNOSIS — I10 Essential (primary) hypertension: Secondary | ICD-10-CM | POA: Diagnosis present

## 2023-09-18 DIAGNOSIS — Z888 Allergy status to other drugs, medicaments and biological substances status: Secondary | ICD-10-CM

## 2023-09-18 LAB — COMPREHENSIVE METABOLIC PANEL WITH GFR
ALT: 27 U/L (ref 0–44)
AST: 22 U/L (ref 15–41)
Albumin: 3.4 g/dL — ABNORMAL LOW (ref 3.5–5.0)
Alkaline Phosphatase: 54 U/L (ref 38–126)
Anion gap: 11 (ref 5–15)
BUN: 9 mg/dL (ref 6–20)
CO2: 26 mmol/L (ref 22–32)
Calcium: 8.7 mg/dL — ABNORMAL LOW (ref 8.9–10.3)
Chloride: 100 mmol/L (ref 98–111)
Creatinine, Ser: 0.76 mg/dL (ref 0.61–1.24)
GFR, Estimated: >60 mL/min (ref >60)
Glucose, Bld: 213 mg/dL — ABNORMAL HIGH (ref 70–99)
Potassium: 3.5 mmol/L (ref 3.5–5.1)
Sodium: 137 mmol/L (ref 135–145)
Total Bilirubin: 1.1 mg/dL (ref 0.0–1.2)
Total Protein: 6.5 g/dL (ref 6.5–8.1)

## 2023-09-18 LAB — CBC WITH DIFFERENTIAL/PLATELET
Abs Immature Granulocytes: 0.08 10*3/uL — ABNORMAL HIGH (ref 0.00–0.07)
Basophils Absolute: 0 10*3/uL (ref 0.0–0.1)
Basophils Relative: 0 %
Eosinophils Absolute: 0.1 10*3/uL (ref 0.0–0.5)
Eosinophils Relative: 1 %
HCT: 44 % (ref 39.0–52.0)
Hemoglobin: 14.8 g/dL (ref 13.0–17.0)
Immature Granulocytes: 1 %
Lymphocytes Relative: 29 %
Lymphs Abs: 3.3 10*3/uL (ref 0.7–4.0)
MCH: 31.8 pg (ref 26.0–34.0)
MCHC: 33.6 g/dL (ref 30.0–36.0)
MCV: 94.4 fL (ref 80.0–100.0)
Monocytes Absolute: 0.9 10*3/uL (ref 0.1–1.0)
Monocytes Relative: 8 %
Neutro Abs: 6.9 10*3/uL (ref 1.7–7.7)
Neutrophils Relative %: 61 %
Platelets: 155 10*3/uL (ref 150–400)
RBC: 4.66 MIL/uL (ref 4.22–5.81)
RDW: 13.4 % (ref 11.5–15.5)
WBC: 11.2 10*3/uL — ABNORMAL HIGH (ref 4.0–10.5)
nRBC: 0 % (ref 0.0–0.2)

## 2023-09-18 LAB — TROPONIN I (HIGH SENSITIVITY): Troponin I (High Sensitivity): 5 ng/L (ref <18)

## 2023-09-18 LAB — BRAIN NATRIURETIC PEPTIDE: B Natriuretic Peptide: 11.7 pg/mL (ref 0.0–100.0)

## 2023-09-18 LAB — HIV ANTIBODY (ROUTINE TESTING W REFLEX): HIV Screen 4th Generation wRfx: NONREACTIVE

## 2023-09-18 MED ORDER — ACETAMINOPHEN 325 MG PO TABS
650.0000 mg | ORAL_TABLET | Freq: Four times a day (QID) | ORAL | Status: DC | PRN
Start: 2023-09-18 — End: 2023-09-22

## 2023-09-18 MED ORDER — SODIUM CHLORIDE 0.9 % IV SOLN
2.0000 g | Freq: Once | INTRAVENOUS | Status: DC
  Filled 2023-09-18: qty 20

## 2023-09-18 MED ORDER — SODIUM CHLORIDE 0.9% FLUSH: 3.0000 mL | Freq: Two times a day (BID) | INTRAVENOUS | Status: DC

## 2023-09-18 MED ORDER — PREDNISONE 5 MG PO TABS: 10.0000 mg | ORAL_TABLET | Freq: Every day | ORAL | Status: DC

## 2023-09-18 MED ORDER — PREDNISONE 20 MG PO TABS
20.0000 mg | ORAL_TABLET | Freq: Every day | ORAL | Status: DC
  Administered 2023-09-19: 20 mg via ORAL
  Filled 2023-09-18: qty 1

## 2023-09-18 MED ORDER — METOPROLOL TARTRATE 25 MG PO TABS
25.0000 mg | ORAL_TABLET | Freq: Two times a day (BID) | ORAL | Status: DC
  Administered 2023-09-18 – 2023-09-22 (×7): 25 mg via ORAL
  Filled 2023-09-18 (×7): qty 1

## 2023-09-18 MED ORDER — SODIUM CHLORIDE 0.9 % IV SOLN
500.0000 mg | Freq: Once | INTRAVENOUS | Status: AC
  Administered 2023-09-18: 500 mg via INTRAVENOUS
  Filled 2023-09-18: qty 5

## 2023-09-18 MED ORDER — ROSUVASTATIN CALCIUM 5 MG PO TABS
10.0000 mg | ORAL_TABLET | Freq: Every day | ORAL | Status: DC
  Administered 2023-09-19 – 2023-09-22 (×4): 10 mg via ORAL
  Filled 2023-09-18 (×5): qty 2

## 2023-09-18 MED ORDER — ACETAMINOPHEN 650 MG RE SUPP: 650.0000 mg | Freq: Four times a day (QID) | RECTAL | Status: DC | PRN

## 2023-09-18 MED ORDER — SODIUM CHLORIDE 0.9% FLUSH: 3.0000 mL | INTRAVENOUS | Status: DC | PRN

## 2023-09-18 MED ORDER — SODIUM CHLORIDE 0.9 % IV SOLN
2.0000 g | Freq: Three times a day (TID) | INTRAVENOUS | Status: DC
  Administered 2023-09-18 – 2023-09-22 (×11): 2 g via INTRAVENOUS
  Filled 2023-09-18 (×11): qty 12.5

## 2023-09-18 MED ORDER — FLUOXETINE HCL 20 MG PO CAPS
20.0000 mg | ORAL_CAPSULE | Freq: Two times a day (BID) | ORAL | Status: DC
  Administered 2023-09-18 – 2023-09-22 (×8): 20 mg via ORAL
  Filled 2023-09-18 (×8): qty 1

## 2023-09-18 MED ORDER — ARIPIPRAZOLE 2 MG PO TABS
2.0000 mg | ORAL_TABLET | Freq: Every day | ORAL | Status: DC
  Administered 2023-09-19 – 2023-09-22 (×4): 2 mg via ORAL
  Filled 2023-09-18 (×5): qty 1

## 2023-09-18 MED ORDER — RIVAROXABAN 20 MG PO TABS
20.0000 mg | ORAL_TABLET | Freq: Every day | ORAL | Status: DC
  Administered 2023-09-18 – 2023-09-21 (×4): 20 mg via ORAL
  Filled 2023-09-18: qty 1
  Filled 2023-09-18: qty 2
  Filled 2023-09-18 (×2): qty 1

## 2023-09-18 NOTE — Assessment & Plan Note (Addendum)
 Vitals:   09/18/23 1200 09/18/23 1230 09/18/23 1300 09/18/23 1530  BP: (!) 144/82 132/77 (!) 141/77 130/71  Patient is on metoprolol  25 twice daily at home which we will continue.  Patient is also on 40 mg of Lasix  daily which I will currently hold as blood pressure is fluctuating.  And will resume at 20 mg daily at bedtime if patient is not orthostatic which we will check.

## 2023-09-18 NOTE — Assessment & Plan Note (Addendum)
 No meds or formal eval by neuropsychiatry in chart. Will monitor.

## 2023-09-18 NOTE — ED Provider Notes (Signed)
 [place] [facility] AT [facility] Provider Note   CSN: [id number] Arrival date & time: 09/18/23  1059     History  Chief Complaint  Patient presents with   Shortness of Breath    Bradley Mcclure is a 63 y.o. male.  63 yoM with a chief complaint of difficulty breathing.  He had reportedly called the [place] and they are concerned about his inability to manage his home medications and he had run out of his home oxygen and so encouraged him to call EMS.  The patient does seem somewhat confused about what medications he supposed to be on.  Tells me he has had some trouble breathing for some time.  Worse with exertion.  Has some chest discomfort with exertion as well.  He feels like he has been coughing a bit over the past few days.  No fever that he knows of.   Shortness of Breath      Home Medications Prior to Admission medications   Medication Sig Start Date End Date Taking? Authorizing Provider  Accu-Chek Softclix Lancets lancets 1 each by Other route 3 (three) times daily. Use as directed to check blood sugar. May dispense any manufacturer covered by patient's insurance and fits patient's device. 09/14/23   Lesa Rape, MD  acetaminophen  (TYLENOL ) 325 MG tablet Take 650 mg by mouth every 6 (six) hours as needed for mild pain.     [provider]  ARIPiprazole  (ABILIFY ) 2 MG tablet Take 2 mg by mouth daily. 05/11/22   [provider]  Blood Glucose Monitoring Suppl (BLOOD GLUCOSE MONITOR SYSTEM) w/Device KIT 1 each by Does not apply route 3 (three) times daily. May dispense any manufacturer covered by patient's insurance. 09/14/23   Lesa Rape, MD  cyanocobalamin  (VITAMIN B12) 500 MCG tablet Take 500 mcg by mouth daily. 08/11/23   [provider]  FLUoxetine  (PROZAC ) 20 MG capsule Take 20 mg by mouth 2 (two) times daily as needed (anxiety/depression). 02/02/22   [provider]  furosemide  (LASIX ) 40 MG tablet Take 1 tablet (40 mg  total) by mouth daily. 09/14/23 10/14/23  Lesa Rape, MD  Glucose Blood (BLOOD GLUCOSE TEST STRIPS) STRP 1 each by Does not apply route 3 (three) times daily. Use as directed to check blood sugar. May dispense any manufacturer covered by patient's insurance and fits patient's device. 09/14/23   Lesa Rape, MD  insulin  aspart (NOVOLOG ) 100 UNIT/ML FlexPen If eating and Blood Glucose (BG) 80 or higher: First 7 days inject 12 units three times before meal Then next 7 days inject  6 units thee times before meal then stop. 09/14/23   Lesa Rape, MD  insulin  glargine (LANTUS ) 100 UNIT/ML Solostar Pen 36 units (0.36 ml)  [place] daily x 7 days After 7 days 25 units (0.25 ml)  daily x 7 days  Then resume your previous dose of 18 u daily  May substitute as needed per insurance. 09/14/23   Lesa Rape, MD  Insulin  Pen Needle 32G X 4 MM MISC 1 each by Does not apply route 3 (three) times daily. May dispense any manufacturer covered by patient's insurance. 09/14/23   Lesa Rape, MD  Lancet Device MISC 1 each by Does not apply route 3 (three) times daily. May dispense any manufacturer covered by patient's insurance. 09/14/23   Lesa Rape, MD  metFORMIN  (GLUCOPHAGE ) 500 MG tablet Take 1,000 mg by mouth 2 (two) times daily with a meal.     [provider]  metoprolol  tartrate (LOPRESSOR ) 25 MG tablet Take 1 tablet (25 mg total) by mouth 2 (two) times daily. 09/15/22   Macdonald Savoy, MD  mupirocin  ointment (BACTROBAN ) 2 % Place 1 application into the nose 2 (two) times daily. 12/24/15   Charolette Copier, MD  pantoprazole  (PROTONIX ) 40 MG tablet Take 1 tablet (40 mg total) by mouth daily. 09/14/23 10/14/23  Lesa Rape, MD  polyethylene glycol (MIRALAX  / GLYCOLAX ) packet Take 17 g by mouth daily. 03/02/16   Leonette Ramal, MD  predniSONE  (DELTASONE ) 10 MG tablet Take 2 tablets (20 mg total) by mouth daily with breakfast for 7 days, THEN 1 tablet (10 mg total) daily with breakfast for 7 days. 09/14/23 09/28/23  Lesa Rape, MD  rivaroxaban  (XARELTO ) 20 MG TABS tablet Take 20 mg by mouth daily with supper.    [provider]  rosuvastatin  (CRESTOR ) 20 MG tablet Take 10 mg by mouth daily. 08/23/23   [provider]  Semaglutide, 2 MG/DOSE, 8 MG/3ML SOPN Inject 2 mg into the skin every 7 (seven) days. 08/29/22   [provider]      Allergies    Empagliflozin     Review of Systems   Review of Systems  Respiratory:  Positive for shortness of breath.     Physical Exam Updated Vital Signs BP (!) 141/77   Pulse 80   Temp 98 F (36.7 C) (Oral)   Resp 18   SpO2 100%  Physical Exam Vitals and nursing note reviewed.  Constitutional:      Appearance: He is well-developed.  HENT:     Head: Normocephalic and atraumatic.  Eyes:     Pupils: Pupils are equal, round, and reactive to light.  Neck:     Vascular: No JVD.  Cardiovascular:     Rate and Rhythm: Normal rate and regular rhythm.     Heart sounds: No murmur heard.    No friction rub. No gallop.  Pulmonary:     Effort: No respiratory distress.     Breath sounds: No wheezing.  Abdominal:     General: There is no distension.     Tenderness: There is no abdominal tenderness. There is no guarding or rebound.  Musculoskeletal:        General: Normal range of motion.     Cervical back: Normal range of motion and neck supple.  Skin:    Coloration: Skin is not pale.     Findings: No rash.  Neurological:     Mental Status: He is alert.     Comments: Patient has some mild confusion.  Psychiatric:        Behavior: Behavior normal.     ED Results / Procedures / Treatments   Labs (all labs ordered are listed, but only abnormal results are displayed) Labs Reviewed  COMPREHENSIVE METABOLIC PANEL WITH GFR - Abnormal; Notable for the following components:      Result Value   Glucose, Bld 213 (*)    Calcium  8.7 (*)    Albumin 3.4 (*)    All other components within normal limits  CBC WITH DIFFERENTIAL/PLATELET -  Abnormal; Notable for the following components:   WBC 11.2 (*)    Abs Immature Granulocytes 0.08 (*)    All other components within normal limits  CULTURE, BLOOD (ROUTINE X 2)  CULTURE, BLOOD (ROUTINE X 2)  BRAIN NATRIURETIC PEPTIDE  CBC WITH DIFFERENTIAL/PLATELET  TROPONIN I (HIGH SENSITIVITY)    EKG EKG Interpretation Date/Time:  Monday September 18 2023 11:16:14 EDT Ventricular Rate:  90 PR Interval:  158 QRS Duration:  106 QT Interval:  371 QTC Calculation: 454 R Axis:   7  Text Interpretation: Sinus rhythm Abnormal R-wave progression, late transition Probable left ventricular hypertrophy Since last tracing rate slower Otherwise no significant change Confirmed by Albertus Hughs [phone]) on 09/18/2023 11:48:43 AM  Radiology DG Chest Port 1 View Result Date: 09/18/2023 CLINICAL DATA:  Shortness of breath EXAM: PORTABLE CHEST 1 VIEW COMPARISON:  Chest radiograph dated 09/15/2023 FINDINGS: Low lung volumes with bronchovascular crowding. Bibasilar patchy opacities, slightly decreased on the right. No pleural effusion or pneumothorax. Similar cardiomediastinal silhouette. No acute osseous abnormality. IMPRESSION: Low lung volumes with bronchovascular crowding. Bibasilar patchy opacities, slightly decreased on the right, may reflect a combination of atelectasis and pneumonia. Electronically Signed   By: Limin  Xu M.D.   On: 09/18/2023 13:30    Procedures Procedures    Medications Ordered in ED Medications  cefTRIAXone  (ROCEPHIN ) 2 g in sodium chloride  0.9 % 100 mL IVPB (has no administration in time range)  azithromycin  (ZITHROMAX ) 500 mg in sodium chloride  0.9 % 250 mL IVPB (has no administration in time range)    ED Course/ Medical Decision Making/ A&P                                 Medical Decision Making Amount and/or Complexity of Data Reviewed Labs: ordered. Radiology: ordered.   63 yo M with a chief complaints of difficulty breathing.  Patient has had issues with this for some  time.  He was actually seen recently in the emergency department and was evaluated and discharged.  Since then it sounds like he struggled to figure out which medicines to take and actually ran out of his home oxygen.  Was tachypneic and hypoxic on EMS arrival but improved with his baseline oxygen.  Will obtain a laboratory evaluation.  Chest x-ray.  Reassess.  Mild leukocytosis but about his baseline.  Troponin negative.  BNP negative.  No significant electrolyte abnormalities renal function appears to be at baseline.  Chest x-ray with possibly pneumonia versus atelectasis.  Patient with history of cough and some worsening difficulty breathing we will treat as pneumonia.  Patient typically is on 2 L of oxygen at all times.  Was placed on 3 here and doing well.  Will discuss with medicine.  The patients results and plan were reviewed and discussed.   Any x-rays performed were independently reviewed by myself.   Differential diagnosis were considered with the presenting HPI.  Medications  cefTRIAXone  (ROCEPHIN ) 2 g in sodium chloride  0.9 % 100 mL IVPB (has no administration in time range)  azithromycin  (ZITHROMAX ) 500 mg in sodium chloride  0.9 % 250 mL IVPB (has no administration in time range)    Vitals:   09/18/23 1116 09/18/23 1200 09/18/23 1230 09/18/23 1300  BP:  (!) 144/82 132/77 (!) 141/77  Pulse: 92 87 82 80  Resp: 18 20 19 18   Temp: 98 F (36.7 C)     TempSrc: Oral     SpO2: 100% 100% 100% 100%    Final diagnoses:  Pneumonia of right lower lobe due to infectious organism    Admission/ observation were discussed with the admitting physician, patient and/or family and they are comfortable with the plan.          Final Clinical Impression(s) / ED Diagnoses Final diagnoses:  Pneumonia of right lower lobe  due to infectious organism    Rx / DC Orders ED Discharge Orders     None         Albertus Hughs, DO 09/18/23 1452

## 2023-09-18 NOTE — ED Notes (Signed)
Phlebotomy called for cultures 

## 2023-09-18 NOTE — ED Notes (Signed)
 Phlebotomy at bedside.

## 2023-09-18 NOTE — Assessment & Plan Note (Addendum)
 Carb consistent cardiac diet patient is currently on semaglutide, Lantus  and metformin .  Will do a swallow evaluation and start patient on appropriate carb restricted diet with glycemic protocol sliding scale insulin  regimen and Accu-Cheks.  While in house we will hold patient's metformin  and insulin .

## 2023-09-18 NOTE — ED Triage Notes (Signed)
 [facility] reports pt coming from home, pt has recent hx of PNA on home O2 all the time but ran out of it for the past two days. Pt on 4L [place] normally. Pt requesting tx to hospital for evaluation. Home health nurse on the phone states pt is needed help at home, pt is not aware of what company he uses for his oxygen and medication. Pt uses the [place].

## 2023-09-18 NOTE — Assessment & Plan Note (Addendum)
 SpO2: 99 % O2 Flow Rate (L/min): 4 L/min Stable.at bedside 100% on RA. PRN Albuterol .  Cont IV abx.  Cont on steroid taper from d/c med list.

## 2023-09-18 NOTE — Assessment & Plan Note (Addendum)
 IV PPI

## 2023-09-18 NOTE — Assessment & Plan Note (Addendum)
 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 We will continue.

## 2023-09-18 NOTE — H&P (Signed)
 History and Physical    Patient: Bradley Mcclure EAV:[id number] DOB: [birth year] DOA: 09/18/2023 DOS: the patient was seen and examined on 09/18/2023 PCP: [name]  Patient coming from: Home Chief complaint: Chief Complaint  Patient presents with   Shortness of Breath   HPI:  Bradley Mcclure is a 63 y.o. male with past medical history  of  veteran, with hx of esophageal dysphagia (? Prior cricopharyngeal narrowing), cognitive dysphagia, PE on anticoagulation, diabetes type 2, hypertension, hyperlipidemia, NAFLD, depression, cognitive impairment, GERD, presenting today for shortness of breath patient was recently admitted on the eighth in May and discharged on 27 May after an extended inpatient stay for his previous admission for dysphagia and aspiration event while eating a sandwich at work.  While in the hospital patient did require higher level O2 supplementation and was difficult to wean off.  Patient was adamant to leave the hospital. Per DC summary patient suspected of ours and had pulmonary critical care following.  Echo on previous admission done was normal.  Blood cultures were negative.  Baseline O2 requirement was assessed at 3 L.  Today patient was found to be confused and short of breath and [facility] nurses [facility] and come to the hospital for shortness of breath.  Per report patient also had run out of his oxygen.  Since discharge patient was again seen on the 30th for similar presentation of fatigue and altered mental status patient had at that time not been using his home oxygen.  MRI done was unremarkable for any acute findings on the 30th and patient was discharged home.Requested Hospital at home to see patient but pt does not meet criteria.   ED Course: Pt in ed at bedside  is alert awake cooperative afebrile in no distress. Vital signs in the ED were notable for the afebrile vital signs are stable O2 sats of 100% on 4  L blood pressure with systolic ranging from 130-144. Vitals:   09/18/23 1200 09/18/23 1230 09/18/23 1300 09/18/23 1530  BP: (!) 144/82 132/77 (!) 141/77 130/71  Pulse: 87 82 80 83  Temp:    98 F (36.7 C)  Resp: 20 19 18 15   SpO2: 100% 100% 100% 99%  TempSrc:    Oral  >>ED evaluation thus far shows: EKG today is sinus rhythm at 90 with PR of 158 QTc of 454 LVH.  BNP of 11.7 troponin of 5. CMP showing glucose of 213 normal kidney function normal LFTs. CBC shows leukocytosis of 11.2 normal hemoglobin and platelets. Chest x-ray done today shows low lung volume and bibasilar patchy opacities slightly decreased on the right with concern for atelectasis or pneumonia.  >>While in the ED patient received the following: Medications  cefTRIAXone  (ROCEPHIN ) 2 g in sodium chloride  0.9 % 100 mL IVPB (has no administration in time range)  azithromycin  (ZITHROMAX ) 500 mg in sodium chloride  0.9 % 250 mL IVPB (has no administration in time range)   Review of Systems  Unable to perform ROS: Acuity of condition  Respiratory:  Positive for shortness of breath.    Past Medical History:  Diagnosis Date   Balanitis 12/24/2015   Cellulitis and abscess 12/24/2015   Generalized anxiety disorder 09/06/2022   Hyperlipidemia 09/06/2022   Hypertension    IBS (irritable bowel syndrome)    Major depressive disorder 09/06/2022   Mild cognitive disorder 09/06/2022   Dec 07, 2016 Entered By: Rosalita Combe Comment: Medical Disabilty 12.2017 -  Jul 10, 2018 Entered By: Aloma Jaksch Comment: diagnosed at [facility]   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   Non-alcoholic fatty liver disease 04/19/2011   Dec 07, 2016 Entered By: Rosalita Combe Comment: Urged to modify lifestyle &amp; Urged again 2018 to begin Coffee 2-4/d for hepatoprotection   Pneumonia 2014-2015 X 1   Pulmonary embolism (HCC) 03/21/2013   Sepsis (HCC) 11/11/2015   Type II diabetes mellitus (HCC)    Past Surgical History:  Procedure Laterality  Date   TEE WITHOUT CARDIOVERSION N/A 11/17/2015   Procedure: TRANSESOPHAGEAL ECHOCARDIOGRAM (TEE);  Surgeon: Maudine Sos, MD;  Location: [facility] ENDOSCOPY;  Service: Cardiovascular;  Laterality: N/A;   TONSILLECTOMY  ~ 1977    reports that he has never smoked. He has never used smokeless tobacco. He reports that he does not drink alcohol and does not use drugs. Allergies  Allergen Reactions   Empagliflozin  Other (See Comments)    Acidosis   Family History  Problem Relation Age of Onset   Myasthenia gravis Mother    Diabetes type II Brother    Prior to Admission medications   Medication Sig Start Date End Date Taking? Authorizing Provider  Accu-Chek Softclix Lancets lancets 1 each by Other route 3 (three) times daily. Use as directed to check blood sugar. May dispense any manufacturer covered by patient's insurance and fits patient's device. 09/14/23   Lesa Rape, MD  acetaminophen  (TYLENOL ) 325 MG tablet Take 650 mg by mouth every 6 (six) hours as needed for mild pain.     [provider]  ARIPiprazole  (ABILIFY ) 2 MG tablet Take 2 mg by mouth daily. 05/11/22   [provider]  Blood Glucose Monitoring Suppl (BLOOD GLUCOSE MONITOR SYSTEM) w/Device KIT 1 each by Does not apply route 3 (three) times daily. May dispense any manufacturer covered by patient's insurance. 09/14/23   Lesa Rape, MD  cyanocobalamin  (VITAMIN B12) 500 MCG tablet Take 500 mcg by mouth daily. 08/11/23   [provider]  FLUoxetine  (PROZAC ) 20 MG capsule Take 20 mg by mouth 2 (two) times daily as needed (anxiety/depression). 02/02/22   [provider]  furosemide  (LASIX ) 40 MG tablet Take 1 tablet (40 mg total) by mouth daily. 09/14/23 10/14/23  Lesa Rape, MD  Glucose Blood (BLOOD GLUCOSE TEST STRIPS) STRP 1 each by Does not apply route 3 (three) times daily. Use as directed to check blood sugar. May dispense any manufacturer covered by patient's insurance and fits patient's device. 09/14/23    Lesa Rape, MD  insulin  aspart (NOVOLOG ) 100 UNIT/ML FlexPen If eating and Blood Glucose (BG) 80 or higher: First 7 days inject 12 units three times before meal Then next 7 days inject  6 units thee times before meal then stop. 09/14/23   Lesa Rape, MD  insulin  glargine (LANTUS ) 100 UNIT/ML Solostar Pen 36 units (0.36 ml)  [place] daily x 7 days After 7 days 25 units (0.25 ml)  daily x 7 days  Then resume your previous dose of 18 u daily  May substitute as needed per insurance. 09/14/23   Lesa Rape, MD  Insulin  Pen Needle 32G X 4 MM MISC 1 each by Does not apply route 3 (three) times daily. May dispense any manufacturer covered by patient's insurance. 09/14/23   Lesa Rape, MD  Lancet Device MISC 1 each by Does not apply route 3 (three) times daily. May dispense any manufacturer covered by patient's insurance. 09/14/23   Lesa Rape, MD  metFORMIN  (GLUCOPHAGE ) 500 MG tablet Take 1,000 mg by  mouth 2 (two) times daily with a meal.     [provider]  metoprolol  tartrate (LOPRESSOR ) 25 MG tablet Take 1 tablet (25 mg total) by mouth 2 (two) times daily. 09/15/22   Macdonald Savoy, MD  mupirocin  ointment (BACTROBAN ) 2 % Place 1 application into the nose 2 (two) times daily. 12/24/15   Charolette Copier, MD  pantoprazole  (PROTONIX ) 40 MG tablet Take 1 tablet (40 mg total) by mouth daily. 09/14/23 10/14/23  Lesa Rape, MD  polyethylene glycol (MIRALAX  / GLYCOLAX ) packet Take 17 g by mouth daily. 03/02/16   Leonette Ramal, MD  predniSONE  (DELTASONE ) 10 MG tablet Take 2 tablets (20 mg total) by mouth daily with breakfast for 7 days, THEN 1 tablet (10 mg total) daily with breakfast for 7 days. 09/14/23 09/28/23  Lesa Rape, MD  rivaroxaban  (XARELTO ) 20 MG TABS tablet Take 20 mg by mouth daily with supper.    [provider]  rosuvastatin  (CRESTOR ) 20 MG tablet Take 10 mg by mouth daily. 08/23/23   [provider]  Semaglutide, 2 MG/DOSE, 8 MG/3ML SOPN Inject 2 mg into the skin every 7  (seven) days. 08/29/22   [provider]                                                                                 Vitals:   09/18/23 1200 09/18/23 1230 09/18/23 1300 09/18/23 1530  BP: (!) 144/82 132/77 (!) 141/77 130/71  Pulse: 87 82 80 83  Resp: 20 19 18 15   Temp:    98 F (36.7 C)  TempSrc:    Oral  SpO2: 100% 100% 100% 99%   Physical Exam Constitutional:      General: He is not in acute distress.    Appearance: He is not ill-appearing.  HENT:     Right Ear: External ear normal.     Left Ear: External ear normal.  Eyes:     Extraocular Movements: Extraocular movements intact.  Cardiovascular:     Rate and Rhythm: Normal rate and regular rhythm.     Heart sounds: Normal heart sounds.  Pulmonary:     Effort: Pulmonary effort is normal.     Breath sounds: Normal breath sounds.  Abdominal:     General: Bowel sounds are normal.  Musculoskeletal:     Right lower leg: No edema.     Left lower leg: No edema.  Neurological:     General: No focal deficit present.     Mental Status: He is alert and oriented to person, place, and time.     Labs on Admission: I have personally reviewed following labs and imaging studies CBC: Recent Labs  Lab 09/15/23 1038 09/18/23 1330  WBC 14.2* 11.2*  NEUTROABS 8.5* 6.9  HGB 15.5 14.8  HCT 46.9 44.0  MCV 93.4 94.4  PLT 195 155   Basic Metabolic Panel: Recent Labs  Lab 09/15/23 1038 09/18/23 1149  NA 135 137  K 3.5 3.5  CL 95* 100  CO2 31 26  GLUCOSE 200* 213*  BUN 18 9  CREATININE 0.73 0.76  CALCIUM  9.0 8.7*   GFR: Estimated Creatinine Clearance: 120.1 mL/min (by C-G formula  based on SCr of 0.76 mg/dL). Liver Function Tests: Recent Labs  Lab 09/15/23 1038 09/18/23 1149  AST 22 22  ALT 38 27  ALKPHOS 69 54  BILITOT 1.0 1.1  PROT 7.2 6.5  ALBUMIN 3.6 3.4*   Recent Labs  Lab 09/15/23 1038  LIPASE 48   Recent Labs  Lab 09/15/23 1109  AMMONIA 22   Coagulation Profile: No results for input(s):  "INR", "PROTIME" in the last 168 hours. Cardiac Enzymes: No results for input(s): "CKTOTAL", "CKMB", "CKMBINDEX", "TROPONINI" in the last 168 hours. BNP (last 3 results) No results for input(s): "PROBNP" in the last 8760 hours. HbA1C: No results for input(s): "HGBA1C" in the last 72 hours. CBG: Recent Labs  Lab 09/13/23 1049 09/13/23 1618 09/13/23 2146 09/14/23 0652 09/14/23 1104  GLUCAP 222* 259* 185* 116* 225*   Lipid Profile: No results for input(s): "CHOL", "HDL", "LDLCALC", "TRIG", "CHOLHDL", "LDLDIRECT" in the last 72 hours. Thyroid Function Tests: No results for input(s): "TSH", "T4TOTAL", "FREET4", "T3FREE", "THYROIDAB" in the last 72 hours. Anemia Panel: No results for input(s): "VITAMINB12", "FOLATE", "FERRITIN", "TIBC", "IRON", "RETICCTPCT" in the last 72 hours. Urine analysis:    Component Value Date/Time   COLORURINE STRAW (A) 09/15/2023 1328   APPEARANCEUR CLEAR 09/15/2023 1328   LABSPEC 1.006 09/15/2023 1328   PHURINE 6.0 09/15/2023 1328   GLUCOSEU 50 (A) 09/15/2023 1328   HGBUR NEGATIVE 09/15/2023 1328   BILIRUBINUR NEGATIVE 09/15/2023 1328   KETONESUR NEGATIVE 09/15/2023 1328   PROTEINUR NEGATIVE 09/15/2023 1328   NITRITE NEGATIVE 09/15/2023 1328   LEUKOCYTESUR NEGATIVE 09/15/2023 1328   Radiological Exams on Admission: DG Chest Port 1 View Result Date: 09/18/2023 CLINICAL DATA:  Shortness of breath EXAM: PORTABLE CHEST 1 VIEW COMPARISON:  Chest radiograph dated 09/15/2023 FINDINGS: Low lung volumes with bronchovascular crowding. Bibasilar patchy opacities, slightly decreased on the right. No pleural effusion or pneumothorax. Similar cardiomediastinal silhouette. No acute osseous abnormality. IMPRESSION: Low lung volumes with bronchovascular crowding. Bibasilar patchy opacities, slightly decreased on the right, may reflect a combination of atelectasis and pneumonia. Electronically Signed   By: Limin  Xu M.D.   On: 09/18/2023 13:30   Data Reviewed: Relevant  notes from primary care and specialist visits, past discharge summaries as available in EHR, including Care Everywhere . Prior diagnostic testing as pertinent to current admission diagnoses, Updated medications and problem lists for reconciliation .ED course, including vitals, labs, imaging, treatment and response to treatment,Triage notes, nursing and pharmacy notes and ED provider's notes.Notable results as noted in HPI.Discussed case with EDMD/ ED APP/ or Specialty MD on call and as needed.  Assessment & Plan Acute hypoxic respiratory failure (HCC) SpO2: 99 % O2 Flow Rate (L/min): 4 L/min Stable.at bedside 100% on RA. PRN Albuterol .  Cont IV abx.  Cont on steroid taper from d/c med list.  Hypertension Vitals:   09/18/23 1200 09/18/23 1230 09/18/23 1300 09/18/23 1530  BP: (!) 144/82 132/77 (!) 141/77 130/71  Patient is on metoprolol  25 twice daily at home which we will continue.  Patient is also on 40 mg of Lasix  daily which I will currently hold as blood pressure is fluctuating.  And will resume at 20 mg daily at bedtime if patient is not orthostatic which we will check.  Diabetes mellitus without complication (HCC) Carb consistent cardiac diet patient is currently on semaglutide, Lantus  and metformin .  Will do a swallow evaluation and start patient on appropriate carb restricted diet with glycemic protocol sliding scale insulin  regimen and Accu-Cheks.  While in house  we will hold patient's metformin  and insulin . Pneumonia Patient found to have patchy opacities on chest x-ray and admission requested for pneumonia and acute respiratory failure with hypoxia.  Procalcitonin is pending along with a D-dimer and a troponin BNP.  Will continue antibiotics if all the tests are negative and procalcitonin is elevated.will start pt on Levaquin  for HCAP coverage.  Pulmonary embolism [facility]) Patient has a history of PE and is already on Xarelto . We will continue.  GERD (gastroesophageal reflux  disease) IV PPI. Mild cognitive impairment of uncertain or unknown etiology No meds or formal eval by neuropsychiatry in chart. Will monitor.   DVT prophylaxis:  Xarelto .  Consults:  None  Advance Care Planning:    Code Status: Full Code   Family Communication:  None.  Disposition Plan:  TBD.  Severity of Illness: The appropriate patient status for this patient is INPATIENT. Inpatient status is judged to be reasonable and necessary in order to provide the required intensity of service to ensure the patient's safety. The patient's presenting symptoms, physical exam findings, and initial radiographic and laboratory data in the context of their chronic comorbidities is felt to place them at high risk for further clinical deterioration. Furthermore, it is not anticipated that the patient will be medically stable for discharge from the hospital within 2 midnights of admission.   * I certify that at the point of admission it is my clinical judgment that the patient will require inpatient hospital care spanning beyond 2 midnights from the point of admission due to high intensity of service, high risk for further deterioration and high frequency of surveillance required.*  Unresulted Labs (From admission, onward)     Start     Ordered   09/19/23 0500  Comprehensive metabolic panel  Tomorrow morning,   R        09/18/23 1709   09/19/23 0500  CBC  Tomorrow morning,   R        09/18/23 1709   09/18/23 1709  HIV Antibody (routine testing w rflx)  (HIV Antibody (Routine testing w reflex) panel)  Once,   R        09/18/23 1709   09/18/23 1451  Blood culture (routine x 2)  BLOOD CULTURE X 2,   R      09/18/23 1450   09/18/23 1137  CBC with Differential  Once,   STAT        09/18/23 1136            Orders Placed This Encounter  Procedures   Blood culture (routine x 2)   DG Chest Port 1 View   CBC with Differential   Brain natriuretic peptide   Comprehensive metabolic panel   CBC with  Differential/Platelet   HIV Antibody (routine testing w rflx)   Comprehensive metabolic panel   CBC   Maintain IV access   Vital signs   Notify physician (specify)   Mobility Protocol: No Restrictions RN to initiate protocols based on patient's level of care   Refer to Sidebar Report Refer to ICU, Med-Surg, Progressive, and Step-Down Mobility Protocol Sidebars   Initiate Adult Central Line Maintenance and Catheter Protocol for patients with central line (CVC, PICC, Port, Hemodialysis, Trialysis)   Daily weights   Intake and Output   Do not place and if present remove PureWick   Initiate Oral Care Protocol   Initiate Carrier Fluid Protocol   RN may order General Admission PRN Orders utilizing "General Admission PRN medications" (through manage  orders) for the following patient needs: allergy symptoms (Claritin), cold sores (Carmex), cough (Robitussin DM), eye irritation (Liquifilm Tears), hemorrhoids (Tucks), indigestion (Maalox), minor skin irritation (Hydrocortisone Cream), muscle pain Lovena Rubinstein Gay), nose irritation (saline nasal spray) and sore throat (Chloraseptic spray).   Cardiac Monitoring Continuous x 48 hours Indications for use: Other; Other indications for use: acute respiratory failure with hypoxia.   Swallow screen   Full code   Consult to hospitalist   Pulse oximetry check with vital signs   Oxygen therapy Mode or (Route): Nasal cannula; Liters Per Minute: 2; Keep O2 saturation between: greater than 92 %   EKG 12-Lead   Admit to Inpatient (patient's expected length of stay will be greater than 2 midnights or inpatient only procedure)   Aspiration precautions   Fall precautions    Author: Lavanda Porter, MD 12 pm- 8 pm. [facility]. 09/18/2023 5:10 PM >>Please note for any concern,or critical results after hours past 8pm please contact the [facility] floor coverage provider from 7 PM- 7 AM. For on call review [email or web address], username [email or web address] and PW: your phone  number<<

## 2023-09-18 NOTE — Assessment & Plan Note (Addendum)
 Patient found to have patchy opacities on chest x-ray and admission requested for pneumonia and acute respiratory failure with hypoxia.  Procalcitonin is pending along with a D-dimer and a troponin BNP.  Will continue antibiotics if all the tests are negative and procalcitonin is elevated.will start pt on Levaquin  for HCAP coverage.

## 2023-09-19 ENCOUNTER — Inpatient Hospital Stay (HOSPITAL_COMMUNITY)

## 2023-09-19 DIAGNOSIS — J9601 Acute respiratory failure with hypoxia: Secondary | ICD-10-CM | POA: Diagnosis not present

## 2023-09-19 LAB — PROCALCITONIN: Procalcitonin: <0.1 ng/mL

## 2023-09-19 LAB — CBC
HCT: 45.8 % (ref 39.0–52.0)
Hemoglobin: 15.1 g/dL (ref 13.0–17.0)
MCH: 31.2 pg (ref 26.0–34.0)
MCHC: 33 g/dL (ref 30.0–36.0)
MCV: 94.6 fL (ref 80.0–100.0)
Platelets: 152 10*3/uL (ref 150–400)
RBC: 4.84 MIL/uL (ref 4.22–5.81)
RDW: 13.6 % (ref 11.5–15.5)
WBC: 10.4 10*3/uL (ref 4.0–10.5)
nRBC: 0 % (ref 0.0–0.2)

## 2023-09-19 LAB — COMPREHENSIVE METABOLIC PANEL WITH GFR
ALT: 31 U/L (ref 0–44)
AST: 26 U/L (ref 15–41)
Albumin: 3.2 g/dL — ABNORMAL LOW (ref 3.5–5.0)
Alkaline Phosphatase: 53 U/L (ref 38–126)
Anion gap: 5 (ref 5–15)
BUN: 6 mg/dL (ref 6–20)
CO2: 29 mmol/L (ref 22–32)
Calcium: 8.7 mg/dL — ABNORMAL LOW (ref 8.9–10.3)
Chloride: 104 mmol/L (ref 98–111)
Creatinine, Ser: 0.69 mg/dL (ref 0.61–1.24)
GFR, Estimated: >60 mL/min (ref >60)
Glucose, Bld: 118 mg/dL — ABNORMAL HIGH (ref 70–99)
Potassium: 3.9 mmol/L (ref 3.5–5.1)
Sodium: 138 mmol/L (ref 135–145)
Total Bilirubin: 1.2 mg/dL (ref 0.0–1.2)
Total Protein: 6.1 g/dL — ABNORMAL LOW (ref 6.5–8.1)

## 2023-09-19 LAB — VITAMIN B12: Vitamin B-12: 1194 pg/mL — ABNORMAL HIGH (ref 180–914)

## 2023-09-19 LAB — HIV ANTIBODY (ROUTINE TESTING W REFLEX): HIV Screen 4th Generation wRfx: NONREACTIVE

## 2023-09-19 LAB — GLUCOSE, CAPILLARY
Glucose-Capillary: 201 mg/dL — ABNORMAL HIGH (ref 70–99)
Glucose-Capillary: 282 mg/dL — ABNORMAL HIGH (ref 70–99)
Glucose-Capillary: 399 mg/dL — ABNORMAL HIGH (ref 70–99)

## 2023-09-19 LAB — BRAIN NATRIURETIC PEPTIDE: B Natriuretic Peptide: 9.8 pg/mL (ref 0.0–100.0)

## 2023-09-19 LAB — TSH: TSH: 0.953 u[IU]/mL (ref 0.350–4.500)

## 2023-09-19 LAB — C-REACTIVE PROTEIN: CRP: <0.5 mg/dL (ref <1.0)

## 2023-09-19 LAB — T4, FREE: Free T4: 1.03 ng/dL (ref 0.61–1.12)

## 2023-09-19 MED ORDER — INSULIN ASPART 100 UNIT/ML IJ SOLN
0.0000 [IU] | Freq: Three times a day (TID) | INTRAMUSCULAR | Status: DC
  Administered 2023-09-19: 5 [IU] via SUBCUTANEOUS
  Administered 2023-09-19: 3 [IU] via SUBCUTANEOUS
  Administered 2023-09-20: 9 [IU] via SUBCUTANEOUS

## 2023-09-19 MED ORDER — INSULIN ASPART 100 UNIT/ML IJ SOLN
0.0000 [IU] | Freq: Every day | INTRAMUSCULAR | Status: DC
  Administered 2023-09-19: 5 [IU] via SUBCUTANEOUS

## 2023-09-19 MED ORDER — FUROSEMIDE 10 MG/ML IJ SOLN
20.0000 mg | Freq: Once | INTRAMUSCULAR | Status: AC
  Administered 2023-09-19: 20 mg via INTRAVENOUS
  Filled 2023-09-19: qty 2

## 2023-09-19 MED ORDER — FUROSEMIDE 10 MG/ML IJ SOLN
40.0000 mg | Freq: Once | INTRAMUSCULAR | Status: AC
  Administered 2023-09-19: 40 mg via INTRAVENOUS
  Filled 2023-09-19: qty 4

## 2023-09-19 MED ORDER — SPIRONOLACTONE 25 MG PO TABS
25.0000 mg | ORAL_TABLET | Freq: Once | ORAL | Status: AC
  Administered 2023-09-19: 25 mg via ORAL
  Filled 2023-09-19: qty 1

## 2023-09-19 MED ORDER — POTASSIUM CHLORIDE CRYS ER 20 MEQ PO TBCR
40.0000 meq | EXTENDED_RELEASE_TABLET | Freq: Once | ORAL | Status: AC
  Administered 2023-09-19: 40 meq via ORAL
  Filled 2023-09-19: qty 2

## 2023-09-19 NOTE — Progress Notes (Signed)
SATURATION QUALIFICATIONS: (This note is used to comply with regulatory documentation for home oxygen)  Patient Saturations on Room Air at Rest = 95%  Patient Saturations on Room Air while Ambulating = 92%   

## 2023-09-19 NOTE — Progress Notes (Signed)
 PROGRESS NOTE                                                                                                                                                                                                             Patient Demographics:    Bradley Mcclure, is a 63 y.o. male, DOB - [birth year], WUJ:[id number]  Outpatient Primary MD for the patient is [facility], [name]    LOS - 1  Admit date - 09/18/2023    Chief Complaint  Patient presents with   Shortness of Breath       Brief Narrative (HPI from H&P)    63 y.o. male with past medical history  of  veteran, with hx of esophageal dysphagia (? Prior cricopharyngeal narrowing), cognitive dysphagia, PE on anticoagulation, diabetes type 2, hypertension, hyperlipidemia, NAFLD, depression, cognitive impairment, GERD, presenting today for shortness of breath patient was recently admitted on the eighth in May and discharged on 27 May after an extended inpatient stay for his previous admission for dysphagia.  He was still quite sick but was adamant that he needed to be discharged last time on oxygen, now presents with shortness of breath currently requiring 3 to 4 L of oxygen and admitted for hypoxia.   Subjective:    Bradley Mcclure today has, No headache, No chest pain, No abdominal pain - No Nausea, No new weakness tingling or numbness, +ve SOB and orthopnea   Assessment  & Plan :     Mild cognitive impairment of uncertain or unknown etiology No meds or formal eval by neuropsychiatry in chart. Will monitor.  Check TSH, RPR, B12 thereafter outpatient neurology follow-up, no headache or focal deficits   Acute hypoxic respiratory failure (HCC) likely due to acute on chronic diastolic CHF. SpO2: 99 % O2 Flow Rate (L/min): 4 L/min Clinically appears to be consistent with acute on chronic diastolic CHF EF 1 month ago was 60%, IV fluids, stop steroids, complete antibiotic  course, rule out ongoing aspiration with speech eval.  Monitor.  Advance activity titrate down oxygen, I-S and flutter valve for pulmonary toiletry.  GERD (gastroesophageal reflux disease) IV PPI.  Pulmonary embolism [facility]) Patient has a history of PE and is already on Xarelto . We will continue.   Pneumonia Speech eval, inflammatory markers stable, antibiotics for 2 more days, currently see above.  Hypertension currently stable  off of medications.   Diabetes mellitus without complication (HCC) Carb consistent cardiac diet patient is currently on semaglutide, Lantus  and metformin .  Lighting scale and Lantus .  Monitor and adjust Lab Results  Component Value Date   HGBA1C 8.9 (H) 08/25/2023   CBG (last 3)  No results for input(s): "GLUCAP" in the last 72 hours.        Condition - Fair  Family Communication  :  None  Code Status :  Full  Consults  :  None  PUD Prophylaxis :     Procedures  :     TTE -       Disposition Plan  :    Status is: Observation   DVT Prophylaxis  :     rivaroxaban  (XARELTO ) tablet 20 mg    Lab Results  Component Value Date   PLT 152 09/19/2023    Diet :  Diet Order     None        Inpatient Medications  Scheduled Meds:  ARIPiprazole   2 mg Oral Daily   FLUoxetine   20 mg Oral BID   furosemide   20 mg Intravenous Once   metoprolol  tartrate  25 mg Oral BID   potassium chloride   40 mEq Oral Once   rivaroxaban   20 mg Oral Q supper   rosuvastatin   10 mg Oral Daily   spironolactone  25 mg Oral Once   Continuous Infusions:  ceFEPime  (MAXIPIME ) IV 2 g (09/19/23 1015)   PRN Meds:.acetaminophen  **OR** acetaminophen , sodium chloride  flush  Antibiotics  :    Anti-infectives (From admission, onward)    Start     Dose/Rate Route Frequency Ordered Stop   09/18/23 1715  ceFEPIme  (MAXIPIME ) 2 g in sodium chloride  0.9 % 100 mL IVPB        2 g 200 mL/hr over 30 Minutes Intravenous Every 8 hours 09/18/23 1700     09/18/23 1500   cefTRIAXone  (ROCEPHIN ) 2 g in sodium chloride  0.9 % 100 mL IVPB  Status:  Discontinued        2 g 200 mL/hr over 30 Minutes Intravenous  Once 09/18/23 1450 09/18/23 1700   09/18/23 1500  azithromycin  (ZITHROMAX ) 500 mg in sodium chloride  0.9 % 250 mL IVPB        500 mg 250 mL/hr over 60 Minutes Intravenous  Once 09/18/23 1450 09/18/23 1841         Objective:   Vitals:   09/19/23 0000 09/19/23 0045 09/19/23 0811 09/19/23 0848  BP: 128/69   124/71  Pulse: 78     Resp:  19    Temp: 97.8 F (36.6 C)  97.8 F (36.6 C) 98.6 F (37 C)  TempSrc: Oral   Oral  SpO2: 93%       Wt Readings from Last 3 Encounters:  09/14/23 96.5 kg  09/19/22 95.1 kg  04/25/16 135.6 kg    No intake or output data in the 24 hours ending 09/19/23 1036   Physical Exam  Awake Alert, No new F.N deficits, Normal affect [place].AT,PERRAL Supple Neck, No JVD,   Symmetrical Chest wall movement, Good air movement bilaterally, +ve rales RRR,No Gallops,Rubs or new Murmurs,  +ve B.Sounds, Abd Soft, No tenderness,   No Cyanosis, trace edema     Data Review:    Recent Labs  Lab 09/15/23 1038 09/18/23 1330 09/19/23 0346  WBC 14.2* 11.2* 10.4  HGB 15.5 14.8 15.1  HCT 46.9 44.0 45.8  PLT 195 155 152  MCV 93.4 94.4  94.6  MCH 30.9 31.8 31.2  MCHC 33.0 33.6 33.0  RDW 13.2 13.4 13.6  LYMPHSABS 4.3* 3.3  --   MONOABS 1.1* 0.9  --   EOSABS 0.1 0.1  --   BASOSABS 0.1 0.0  --     Recent Labs  Lab 09/15/23 1038 09/15/23 1109 09/18/23 1149 09/18/23 1330 09/19/23 0346 09/19/23 0539  NA 135  --  137  --  138  --   K 3.5  --  3.5  --  3.9  --   CL 95*  --  100  --  104  --   CO2 31  --  26  --  29  --   ANIONGAP 9  --  11  --  5  --   GLUCOSE 200*  --  213*  --  118*  --   BUN 18  --  9  --  6  --   CREATININE 0.73  --  0.76  --  0.69  --   AST 22  --  22  --  26  --   ALT 38  --  27  --  31  --   ALKPHOS 69  --  54  --  53  --   BILITOT 1.0  --  1.1  --  1.2  --   ALBUMIN 3.6  --  3.4*  --  3.2*   --   CRP  --   --   --   --  <0.5  --   PROCALCITON  --   --   --   --  <0.10  --   TSH  --  0.680  --   --   --   --   AMMONIA  --  22  --   --   --   --   BNP  --   --   --  11.7  --  9.8  CALCIUM  9.0  --  8.7*  --  8.7*  --       Recent Labs  Lab 09/15/23 1038 09/15/23 1109 09/18/23 1149 09/18/23 1330 09/19/23 0346 09/19/23 0539  CRP  --   --   --   --  <0.5  --   PROCALCITON  --   --   --   --  <0.10  --   TSH  --  0.680  --   --   --   --   AMMONIA  --  22  --   --   --   --   BNP  --   --   --  11.7  --  9.8  CALCIUM  9.0  --  8.7*  --  8.7*  --     --------------------------------------------------------------------------------------------------------------- No results found for: "CHOL", "HDL", "LDLCALC", "LDLDIRECT", "TRIG", "CHOLHDL"  Lab Results  Component Value Date   HGBA1C 8.9 (H) 08/25/2023   No results for input(s): "TSH", "T4TOTAL", "FREET4", "T3FREE", "THYROIDAB" in the last 72 hours. No results for input(s): "VITAMINB12", "FOLATE", "FERRITIN", "TIBC", "IRON", "RETICCTPCT" in the last 72 hours. ------------------------------------------------------------------------------------------------------------------ Cardiac Enzymes No results for input(s): "CKMB", "TROPONINI", "MYOGLOBIN" in the last 168 hours.  Invalid input(s): "CK"  Micro Results Recent Results (from the past 240 hours)  Blood culture (routine x 2)     Status: None (Preliminary result)   Collection Time: 09/18/23  2:51 PM   Specimen: BLOOD  Result Value Ref Range Status   Specimen Description BLOOD LEFT ANTECUBITAL  Final  Special Requests   Final    BOTTLES DRAWN AEROBIC AND ANAEROBIC Blood Culture adequate volume   Culture   Final    NO GROWTH < 24 HOURS Performed at [facility], [address] [place], [place] [address]    Report Status PENDING  Incomplete  Blood culture (routine x 2)     Status: None (Preliminary result)   Collection Time: 09/18/23  3:50 PM   Specimen:  BLOOD LEFT HAND  Result Value Ref Range Status   Specimen Description BLOOD LEFT HAND  Final   Special Requests   Final    BOTTLES DRAWN AEROBIC AND ANAEROBIC Blood Culture results may not be optimal due to an inadequate volume of blood received in culture bottles   Culture   Final    NO GROWTH < 24 HOURS Performed at [facility], [address], [place], [place] [address]    Report Status PENDING  Incomplete    Radiology Report DG Chest Port 1 View Result Date: 09/19/2023 EXAM: 1 VIEW XRAY OF THE CHEST 09/19/2023 06:32:00 AM COMPARISON: 1 view chest x-ray 09/18/2023. CLINICAL HISTORY: 141880 SOB (shortness of breath) 141880. Reason for exam: SOB. However, pt denies sob/cp at this time. Pt C/O productive cough FINDINGS: LUNGS AND PLEURA: Increasing interstitial and airspace opacities likely reflect edema. Bibasilar atelectasis. No pleural effusion. No pneumothorax. HEART AND MEDIASTINUM: No acute abnormality of the cardiac and mediastinal silhouettes. BONES AND SOFT TISSUES: No acute osseous abnormality. IMPRESSION: 1. Increasing interstitial and airspace opacities, likely reflecting edema and congestive heart failure. 2. Bibasilar atelectasis. Electronically signed by: Audree Leas MD 09/19/2023 06:45 AM EDT RP Workstation: [id number]   DG Chest Port 1 View Result Date: 09/18/2023 CLINICAL DATA:  Shortness of breath EXAM: PORTABLE CHEST 1 VIEW COMPARISON:  Chest radiograph dated 09/15/2023 FINDINGS: Low lung volumes with bronchovascular crowding. Bibasilar patchy opacities, slightly decreased on the right. No pleural effusion or pneumothorax. Similar cardiomediastinal silhouette. No acute osseous abnormality. IMPRESSION: Low lung volumes with bronchovascular crowding. Bibasilar patchy opacities, slightly decreased on the right, may reflect a combination of atelectasis and pneumonia. Electronically Signed   By: Limin  Xu M.D.   On: 09/18/2023 13:30     Signature  -   Lynnwood Sauer  M.D on 09/19/2023 at 10:36 AM   -  To page go to [email or web address]

## 2023-09-19 NOTE — Plan of Care (Signed)
 progressing

## 2023-09-19 NOTE — Evaluation (Signed)
 Physical Therapy Evaluation and Discharge Patient Details Name: DESTRY DAUBER MRN: [id number] DOB: November 23, 1960 Today's Date: 09/19/2023  History of Present Illness  63 y.o. male presenting 6/2 with shortness of breath. Acute hypoxic respiratory failure (HCC) likely due to acute on chronic diastolic CHF. PMH esophageal dysphagia (? Prior cricopharyngeal narrowing), cognitive dysphagia, PE on anticoagulation, diabetes type 2, hypertension, hyperlipidemia, NAFLD, depression, cognitive impairment, GERD.   Clinical Impression  Patient evaluated by Physical Therapy with no further acute PT needs identified. All education has been completed and the patient has no further questions. Ambulates without loss of balance, navigating around objects in hallway and room without assistive device. No physical assist required, no buckling or overt LOB. Feels back to baseline. SpO2 90-92% on RA while ambulating, 95% at rest. All questions answered. See below for any follow-up Physical Therapy or equipment needs. PT is signing off. Thank you for this referral.         If plan is discharge home, recommend the following: Assist for transportation   Can travel by private vehicle   Yes    Equipment Recommendations None recommended by PT  Recommendations for Other Services       Functional Status Assessment Patient has not had a recent decline in their functional status     Precautions / Restrictions Precautions Precautions: Fall Recall of Precautions/Restrictions: Impaired Precaution/Restrictions Comments: watch sats Restrictions Weight Bearing Restrictions Per Provider Order: No      Mobility  Bed Mobility               General bed mobility comments: in recliner    Transfers Overall transfer level: Modified independent Equipment used: None Transfers: Sit to/from Stand Sit to Stand: Modified independent (Device/Increase time)           General transfer comment: No assist, declines  need for AD. Stable upon rising.    Ambulation/Gait Ambulation/Gait assistance: Modified independent (Device/Increase time) Gait Distance (Feet): 175 Feet Assistive device: None Gait Pattern/deviations: Step-through pattern, Decreased stride length, Wide base of support Gait velocity: decr     General Gait Details: Somewhat rigid gait pattern with increase hip external rotation causing wider BOS. No overt LOB or buckling, able to navigate around obstacles in hallway. No buckling. pt reports feeling baseline. SpO2 90-92% on room air.  Stairs            Wheelchair Mobility     Tilt Bed    Modified Rankin (Stroke Patients Only)       Balance Overall balance assessment: Needs assistance Sitting-balance support: Feet supported Sitting balance-Leahy Scale: Normal     Standing balance support: No upper extremity supported, During functional activity Standing balance-Leahy Scale: Good                               Pertinent Vitals/Pain Pain Assessment Pain Assessment: No/denies pain    Home Living Family/patient expects to be discharged to:: Private residence Living Arrangements: Alone Available Help at Discharge:  (none per pt) Type of Home: House (condo) Home Access: Stairs to enter Entrance Stairs-Rails: None Entrance Stairs-Number of Steps: 2   Home Layout: One level Home Equipment: Toilet riser      Prior Function Prior Level of Function : Independent/Modified Independent;Driving             Mobility Comments: ind ADLs Comments: 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     Extremity/Trunk Assessment   Upper Extremity  Assessment Upper Extremity Assessment: Defer to OT evaluation    Lower Extremity Assessment RLE Deficits / Details: states callouses on feet, wears shoes [facility] by [facility] RLE Sensation: history of peripheral neuropathy LLE Deficits / Details: states callouses on feet, wears shoes provided by [facility] LLE Sensation:  history of peripheral neuropathy       Communication   Communication Communication: No apparent difficulties    Cognition Arousal: Alert Behavior During Therapy: Flat affect   PT - Cognitive impairments: History of cognitive impairments, No family/caregiver present to determine baseline, Initiation                       PT - Cognition Comments: abnormal without formal diagnosis at this time. Following commands: Impaired Following commands impaired: Follows multi-step commands with increased time     Cueing Cueing Techniques: Verbal cues     General Comments General comments (skin integrity, edema, etc.): SpO2 92% on RA while ambulating, 95% at rest.    Exercises     Assessment/Plan    PT Assessment Patient does not need any further PT services  PT Problem List Decreased mobility;Decreased strength;Decreased knowledge of use of DME;Cardiopulmonary status limiting activity       PT Treatment Interventions      PT Goals (Current goals can be found in the Care Plan section)  Acute Rehab PT Goals Patient Stated Goal: home PT Goal Formulation: All assessment and education complete, DC therapy    Frequency       Co-evaluation               AM-PAC PT "6 Clicks" Mobility  Outcome Measure Help needed turning from your back to your side while in a flat bed without using bedrails?: None Help needed moving from lying on your back to sitting on the side of a flat bed without using bedrails?: None Help needed moving to and from a bed to a chair (including a wheelchair)?: None Help needed standing up from a chair using your arms (e.g., wheelchair or bedside chair)?: None Help needed to walk in hospital room?: None Help needed climbing 3-5 steps with a railing? : A Little 6 Click Score: 23    End of Session   Activity Tolerance: Patient tolerated treatment well Patient left: with call bell/phone within reach;in chair Nurse Communication: Mobility status PT  Visit Diagnosis: Other abnormalities of gait and mobility (R26.89)    Time: 4098-1191 PT Time Calculation (min) (ACUTE ONLY): 20 min   Charges:   PT Evaluation $PT Eval Low Complexity: 1 Low   PT General Charges $$ ACUTE PT VISIT: 1 Visit         Jory Ng, PT, DPT [facility]  Rehabilitation Services Physical Therapist Office: [phone] Website: [email or web address]   Alinda Irani 09/19/2023, 1:50 PM

## 2023-09-19 NOTE — Evaluation (Signed)
 Clinical/Bedside Swallow Evaluation Patient Details  Name: Bradley Mcclure MRN: [id number] Date of Birth: 12/04/1960  Today's Date: 09/19/2023 Time: SLP Start Time (ACUTE ONLY): 1130 SLP Stop Time (ACUTE ONLY): 1146 SLP Time Calculation (min) (ACUTE ONLY): 16 min  Past Medical History:  Past Medical History:  Diagnosis Date   Balanitis 12/24/2015   Cellulitis and abscess 12/24/2015   Generalized anxiety disorder 09/06/2022   Hyperlipidemia 09/06/2022   Hypertension    IBS (irritable bowel syndrome)    Major depressive disorder 09/06/2022   Mild cognitive disorder 09/06/2022   Dec 07, 2016 Entered By: Rosalita Combe Comment: Medical Disabilty 12.2017 -Jul 10, 2018 Entered By: Aloma Jaksch Comment: diagnosed at [facility]   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   Non-alcoholic fatty liver disease 04/19/2011   Dec 07, 2016 Entered By: Rosalita Combe Comment: Urged to modify lifestyle &amp; Urged again 2018 to begin Coffee 2-4/d for hepatoprotection   Pneumonia 2014-2015 X 1   Pulmonary embolism (HCC) 03/21/2013   Sepsis (HCC) 11/11/2015   Type II diabetes mellitus (HCC)    Past Surgical History:  Past Surgical History:  Procedure Laterality Date   TEE WITHOUT CARDIOVERSION N/A 11/17/2015   Procedure: TRANSESOPHAGEAL ECHOCARDIOGRAM (TEE);  Surgeon: Maudine Sos, MD;  [facility]: [facility] ENDOSCOPY;  Service: Cardiovascular;  Laterality: N/A;   TONSILLECTOMY  ~ 1977   HPI:  63 y.o. male with past medical history of veteran, with hx of  (Prior cricopharyngeal narrowing), PE on anticoagulation, diabetes type 2, hypertension, hyperlipidemia, NAFLD, depression, cognitive impairment, GERD, presenting today for shortness of breath patient was recently admitted on the eighth in May and discharged on 27 May after an extended inpatient stay for his previous admission for dysphagia.  CXR Increasing interstitial and airspace opacities, likely reflecting edema and congestive heart failure. MD assessment  includes pna. MBS 08/30/2023 with cervical osteophytes impacting epigottic inversion. Silent penetration of thin and nectar with chin tuck preventing penetration with thin and recommendation for regular, thin liquids with chin tuck.    Assessment / Plan / Recommendation  Clinical Impression  Pt known to ST services from prior [facility] 08/30/23 with recommendation of regular texture, thin liquids with chin tuck. Pt responded that he was instructed to tuck his chin after MBS without cues however stated he is unsure if he has been implementing. SLP reviewed results and recommendations with pt. He was able to demonstrate chin tuck with liquids consistently without s/s aspiration. Mastication of solid texture with timely, without residue and pt denied pharyngeal globus sensation. Intake of applesauce was unremarkable. Recommend regular texture, thin liquids with  chin tuck, pills whole in applesauce. Signs placed on wall to remind pt of strategy. ST will continue to follow.  SLP Visit Diagnosis: Dysphagia, unspecified (R13.10)    Aspiration Risk  Mild aspiration risk;Moderate aspiration risk    Diet Recommendation Regular;Thin liquid    Liquid Administration via: Straw;Cup Medication Administration: Whole meds with puree Supervision: Patient able to self feed;Intermittent supervision to cue for compensatory strategies Compensations: Slow rate;Small sips/bites;Chin tuck (chin tuck with liquids) Postural Changes: Seated upright at 90 degrees    Other  Recommendations Oral Care Recommendations: Oral care BID    Recommendations for follow up therapy are one component of a multi-disciplinary discharge planning process, led by the attending physician.  Recommendations may be updated based on patient status, additional functional criteria and insurance authorization.  Follow up Recommendations No SLP follow up      Assistance Recommended at Discharge    Functional Status Assessment  Patient has had a recent  decline in their functional status and demonstrates the ability to make significant improvements in function in a reasonable and predictable amount of time.  Frequency and Duration min 2x/week  2 weeks       Prognosis Prognosis for improved oropharyngeal function: Good      Swallow Study   General Date of Onset: 09/19/23 HPI: 63 y.o. male with past medical history of veteran, with hx of  (Prior cricopharyngeal narrowing), PE on anticoagulation, diabetes type 2, hypertension, hyperlipidemia, NAFLD, depression, cognitive impairment, GERD, presenting today for shortness of breath patient was recently admitted on the eighth in May and discharged on 27 May after an extended inpatient stay for his previous admission for dysphagia.  CXR Increasing interstitial and airspace opacities, likely reflecting edema and congestive heart failure. MD assessment includes pna. MBS 08/30/2023 with cervical osteophytes impacting epigottic inversion. Silent penetration of thin and nectar with chin tuck preventing penetration with thin and recommendation for regular, thin liquids with chin tuck. Type of Study: Bedside Swallow Evaluation Previous Swallow Assessment:  (see HPI) Diet Prior to this Study: NPO Temperature Spikes Noted: No Respiratory Status: Room air History of Recent Intubation: No Behavior/Cognition: Cooperative;Alert;Pleasant mood Oral Cavity Assessment: Within Functional Limits Oral Care Completed by SLP: No Oral Cavity - Dentition: Missing dentition (missing scattered upper and lower) Vision: Functional for self-feeding Self-Feeding Abilities: Able to feed self Patient Positioning: Upright in chair Baseline Vocal Quality: Normal Volitional Cough: Strong Volitional Swallow: Able to elicit    Oral/Motor/Sensory Function Overall Oral Motor/Sensory Function: Within functional limits   Ice Chips Ice chips: Not tested   Thin Liquid Thin Liquid: Within functional limits (with chin tuck  strategy) Presentation: Cup;Straw    Nectar Thick Nectar Thick Liquid: Not tested   Honey Thick Honey Thick Liquid: Not tested   Puree Puree: Within functional limits   Solid     Solid: Within functional limits      Naomia Bachelor 09/19/2023,12:12 PM

## 2023-09-20 ENCOUNTER — Observation Stay (HOSPITAL_COMMUNITY)

## 2023-09-20 DIAGNOSIS — J9601 Acute respiratory failure with hypoxia: Secondary | ICD-10-CM | POA: Diagnosis not present

## 2023-09-20 LAB — CBC WITH DIFFERENTIAL/PLATELET
Abs Immature Granulocytes: 0.08 10*3/uL — ABNORMAL HIGH (ref 0.00–0.07)
Basophils Absolute: 0.1 10*3/uL (ref 0.0–0.1)
Basophils Relative: 0 %
Eosinophils Absolute: 0.1 10*3/uL (ref 0.0–0.5)
Eosinophils Relative: 1 %
HCT: 43.9 % (ref 39.0–52.0)
Hemoglobin: 14.9 g/dL (ref 13.0–17.0)
Immature Granulocytes: 1 %
Lymphocytes Relative: 23 %
Lymphs Abs: 2.7 10*3/uL (ref 0.7–4.0)
MCH: 31.2 pg (ref 26.0–34.0)
MCHC: 33.9 g/dL (ref 30.0–36.0)
MCV: 92 fL (ref 80.0–100.0)
Monocytes Absolute: 1 10*3/uL (ref 0.1–1.0)
Monocytes Relative: 9 %
Neutro Abs: 7.8 10*3/uL — ABNORMAL HIGH (ref 1.7–7.7)
Neutrophils Relative %: 66 %
Platelets: 165 10*3/uL (ref 150–400)
RBC: 4.77 MIL/uL (ref 4.22–5.81)
RDW: 13.3 % (ref 11.5–15.5)
WBC: 11.6 10*3/uL — ABNORMAL HIGH (ref 4.0–10.5)
nRBC: 0 % (ref 0.0–0.2)

## 2023-09-20 LAB — BASIC METABOLIC PANEL WITH GFR
Anion gap: 8 (ref 5–15)
BUN: 11 mg/dL (ref 6–20)
CO2: 24 mmol/L (ref 22–32)
Calcium: 8.8 mg/dL — ABNORMAL LOW (ref 8.9–10.3)
Chloride: 101 mmol/L (ref 98–111)
Creatinine, Ser: 0.78 mg/dL (ref 0.61–1.24)
GFR, Estimated: >60 mL/min (ref >60)
Glucose, Bld: 262 mg/dL — ABNORMAL HIGH (ref 70–99)
Potassium: 3.8 mmol/L (ref 3.5–5.1)
Sodium: 133 mmol/L — ABNORMAL LOW (ref 135–145)

## 2023-09-20 LAB — GLUCOSE, CAPILLARY
Glucose-Capillary: 358 mg/dL — ABNORMAL HIGH (ref 70–99)
Glucose-Capillary: 268 mg/dL — ABNORMAL HIGH (ref 70–99)
Glucose-Capillary: 225 mg/dL — ABNORMAL HIGH (ref 70–99)
Glucose-Capillary: 193 mg/dL — ABNORMAL HIGH (ref 70–99)

## 2023-09-20 LAB — RPR: RPR Ser Ql: NONREACTIVE

## 2023-09-20 LAB — MAGNESIUM: Magnesium: 1.9 mg/dL (ref 1.7–2.4)

## 2023-09-20 LAB — C-REACTIVE PROTEIN: CRP: <0.5 mg/dL (ref <1.0)

## 2023-09-20 LAB — PHOSPHORUS: Phosphorus: 2.1 mg/dL — ABNORMAL LOW (ref 2.5–4.6)

## 2023-09-20 LAB — BRAIN NATRIURETIC PEPTIDE: B Natriuretic Peptide: 14.2 pg/mL (ref 0.0–100.0)

## 2023-09-20 LAB — PROCALCITONIN: Procalcitonin: <0.1 ng/mL

## 2023-09-20 MED ORDER — INSULIN GLARGINE-YFGN 100 UNIT/ML ~~LOC~~ SOLN
30.0000 [IU] | Freq: Every day | SUBCUTANEOUS | Status: DC
  Administered 2023-09-20 – 2023-09-22 (×3): 30 [IU] via SUBCUTANEOUS
  Filled 2023-09-20 (×3): qty 0.3

## 2023-09-20 MED ORDER — PANTOPRAZOLE SODIUM 40 MG PO TBEC
40.0000 mg | DELAYED_RELEASE_TABLET | Freq: Every day | ORAL | Status: DC
  Administered 2023-09-20 – 2023-09-22 (×3): 40 mg via ORAL
  Filled 2023-09-20 (×3): qty 1

## 2023-09-20 MED ORDER — POTASSIUM CHLORIDE CRYS ER 20 MEQ PO TBCR
40.0000 meq | EXTENDED_RELEASE_TABLET | Freq: Once | ORAL | Status: AC
  Administered 2023-09-20: 40 meq via ORAL
  Filled 2023-09-20: qty 2

## 2023-09-20 MED ORDER — INSULIN GLARGINE-YFGN 100 UNIT/ML ~~LOC~~ SOLN: 20.0000 [IU] | Freq: Every day | SUBCUTANEOUS | Status: DC

## 2023-09-20 MED ORDER — POTASSIUM PHOSPHATES 15 MMOLE/5ML IV SOLN
30.0000 mmol | Freq: Once | INTRAVENOUS | Status: AC
  Administered 2023-09-20: 30 mmol via INTRAVENOUS
  Filled 2023-09-20: qty 10

## 2023-09-20 MED ORDER — INSULIN ASPART 100 UNIT/ML IJ SOLN
0.0000 [IU] | Freq: Three times a day (TID) | INTRAMUSCULAR | Status: DC
  Administered 2023-09-20: 8 [IU] via SUBCUTANEOUS
  Administered 2023-09-20 – 2023-09-21 (×3): 5 [IU] via SUBCUTANEOUS
  Administered 2023-09-21: 8 [IU] via SUBCUTANEOUS
  Administered 2023-09-22: 3 [IU] via SUBCUTANEOUS

## 2023-09-20 MED ORDER — INSULIN ASPART 100 UNIT/ML IJ SOLN
0.0000 [IU] | Freq: Every day | INTRAMUSCULAR | Status: DC
  Administered 2023-09-21: 2 [IU] via SUBCUTANEOUS

## 2023-09-20 MED ORDER — FUROSEMIDE 10 MG/ML IJ SOLN
40.0000 mg | Freq: Once | INTRAMUSCULAR | Status: AC
  Administered 2023-09-20: 40 mg via INTRAVENOUS
  Filled 2023-09-20: qty 4

## 2023-09-20 NOTE — TOC CM/SW Note (Signed)
 Transition of Care [facility]) - Inpatient Brief Assessment   Patient Details  Name: Bradley Mcclure MRN: [id number] Date of Birth: Jun 21, 1960  [facility] CM/SW Contact:    Jannice Mends, LCSW Phone Number: 09/20/2023, 9:07 AM   Clinical Narrative: Patient admitted from home alone with respiratory failure. Was recently set up with [facility] and home oxygen. TOC will continue to follow for therapy needs.    Transition of Care Asessment: Insurance and Status: Insurance coverage has been reviewed Patient has primary care physician: Yes Home environment has been reviewed: From home Prior level of function:: Independent Prior/Current Home Services: No current home services Social Drivers of Health Review: SDOH reviewed no interventions necessary Readmission risk has been reviewed: Yes Transition of care needs: transition of care needs identified, TOC will continue to follow

## 2023-09-20 NOTE — Plan of Care (Signed)
 Progressing

## 2023-09-20 NOTE — Progress Notes (Addendum)
 PROGRESS NOTE                                                                                                                                                                                                             Patient Demographics:    Bradley Mcclure, is a 63 y.o. male, DOB - [birth year], WUJ:[id number]  Outpatient Primary MD for the patient is [name]    LOS - 1  Admit date - 09/18/2023    Chief Complaint  Patient presents with   Shortness of Breath       Brief Narrative (HPI from H&P)    63 y.o. male with past medical history  of  veteran, with hx of esophageal dysphagia (? Prior cricopharyngeal narrowing), cognitive dysphagia, PE on anticoagulation, diabetes type 2, hypertension, hyperlipidemia, NAFLD, depression, cognitive impairment, GERD, presenting today for shortness of breath patient was recently admitted on the eighth in May and discharged on 27 May after an extended inpatient stay for his previous admission for dysphagia.  He was still quite sick but was adamant that he needed to be discharged last time on oxygen, now presents with shortness of breath currently requiring 3 to 4 L of oxygen and admitted for hypoxia.   Subjective:   Patient in bed, appears comfortable, denies any headache, no fever, no chest pain or pressure, improved orthopnea and shortness of breath , no abdominal pain. No new focal weakness.   Assessment  & Plan :   Acute hypoxic respiratory failure (HCC) likely due to acute on chronic diastolic CHF EF 60%. Been having issues with hypoxia for several weeks starting last admission, clinically appeared to have CHF, IV fluids and steroids were stopped on 09/19/2023, was started on moderate dose IV Lasix  with improvement, encouraged to sit in chair use I-S and flutter valve for pulmonary toiletry, advance activity and titrate on oxygen.  Was on 4 L nasal oxygen on 09/19/2022, currently  down to 1 L on exertion.  Continue to diurese and monitor, cleared by speech therapy, no aspiration.  Mild cognitive impairment of uncertain or unknown etiology No meds or formal eval by neuropsychiatry in chart. Will monitor.  Stable TSH, RPR and B12, thereafter outpatient neurology follow-up, no headache or focal deficits  GERD (gastroesophageal reflux disease)  PPI.  Pulmonary embolism [facility]) Patient has a history of PE  and is already on Xarelto . We will continue.   Pneumonia Speech eval was unremarkable, inflammatory markers stable, off antibiotics after a total of 4-day course.  Hypertension currently stable off of medications.  Diabetes mellitus without complication (HCC)   Placed on Semglee  along with sliding scale, monitor and adjust, diabetic and insulin  education.  Dose adjusted on 09/20/2022.  Lab Results  Component Value Date   HGBA1C 8.9 (H) 08/25/2023   CBG (last 3)  Recent Labs    09/19/23 1655 09/19/23 2112 09/20/23 0750  GLUCAP 282* 399* 358*          Condition - Fair  Family Communication  :  None  Code Status :  Full  Consults  :  None  PUD Prophylaxis :     Procedures  :     TTE -       Disposition Plan  :    Status is: Observation   DVT Prophylaxis  :     rivaroxaban  (XARELTO ) tablet 20 mg    Lab Results  Component Value Date   PLT 165 09/20/2023    Diet :  Diet Order             Diet regular Room service appropriate? Yes with Assist; Fluid consistency: Thin  Diet effective now                    Inpatient Medications  Scheduled Meds:  ARIPiprazole   2 mg Oral Daily   FLUoxetine   20 mg Oral BID   insulin  aspart  0-15 Units Subcutaneous TID WC   insulin  aspart  0-5 Units Subcutaneous QHS   insulin  glargine-yfgn  30 Units Subcutaneous Daily   metoprolol  tartrate  25 mg Oral BID   pantoprazole   40 mg Oral Daily   rivaroxaban   20 mg Oral Q supper   rosuvastatin   10 mg Oral Daily   Continuous Infusions:  ceFEPime   (MAXIPIME ) IV 2 g (09/20/23 0820)   potassium PHOSPHATE IVPB (in mmol) 30 mmol (09/20/23 0709)   PRN Meds:.acetaminophen  **OR** acetaminophen , sodium chloride  flush  Antibiotics  :    Anti-infectives (From admission, onward)    Start     Dose/Rate Route Frequency Ordered Stop   09/18/23 1715  ceFEPIme  (MAXIPIME ) 2 g in sodium chloride  0.9 % 100 mL IVPB        2 g 200 mL/hr over 30 Minutes Intravenous Every 8 hours 09/18/23 1700     09/18/23 1500  cefTRIAXone  (ROCEPHIN ) 2 g in sodium chloride  0.9 % 100 mL IVPB  Status:  Discontinued        2 g 200 mL/hr over 30 Minutes Intravenous  Once 09/18/23 1450 09/18/23 1700   09/18/23 1500  azithromycin  (ZITHROMAX ) 500 mg in sodium chloride  0.9 % 250 mL IVPB        500 mg 250 mL/hr over 60 Minutes Intravenous  Once 09/18/23 1450 09/18/23 1841         Objective:   Vitals:   09/19/23 2344 09/20/23 0500 09/20/23 0751 09/20/23 0811  BP: 104/67  127/65 127/65  Pulse:   88 90  Resp:   19   Temp: 97.7 F (36.5 C)  97.7 F (36.5 C)   TempSrc: Axillary  Oral   SpO2:   92%   Weight:  97.9 kg      Wt Readings from Last 3 Encounters:  09/20/23 97.9 kg  09/14/23 96.5 kg  09/19/22 95.1 kg     Intake/Output Summary (Last 24  hours) at 09/20/2023 0821 Last data filed at 09/20/2023 0318 Gross per 24 hour  Intake --  Output 1900 ml  Net -1900 ml     Physical Exam  Awake Alert, No new F.N deficits, Normal affect [place].AT,PERRAL Supple Neck, No JVD,   Symmetrical Chest wall movement, Good air movement bilaterally, +ve rales RRR,No Gallops,Rubs or new Murmurs,  +ve B.Sounds, Abd Soft, No tenderness,   No Cyanosis, trace edema     Data Review:    Recent Labs  Lab 09/15/23 1038 09/18/23 1330 09/19/23 0346 09/20/23 0343  WBC 14.2* 11.2* 10.4 11.6*  HGB 15.5 14.8 15.1 14.9  HCT 46.9 44.0 45.8 43.9  PLT 195 155 152 165  MCV 93.4 94.4 94.6 92.0  MCH 30.9 31.8 31.2 31.2  MCHC 33.0 33.6 33.0 33.9  RDW 13.2 13.4 13.6 13.3  LYMPHSABS  4.3* 3.3  --  2.7  MONOABS 1.1* 0.9  --  1.0  EOSABS 0.1 0.1  --  0.1  BASOSABS 0.1 0.0  --  0.1    Recent Labs  Lab 09/15/23 1038 09/15/23 1109 09/18/23 1149 09/18/23 1330 09/19/23 0346 09/19/23 0539 09/20/23 0343  NA 135  --  137  --  138  --   --   K 3.5  --  3.5  --  3.9  --   --   CL 95*  --  100  --  104  --   --   CO2 31  --  26  --  29  --   --   ANIONGAP 9  --  11  --  5  --   --   GLUCOSE 200*  --  213*  --  118*  --   --   BUN 18  --  9  --  6  --   --   CREATININE 0.73  --  0.76  --  0.69  --   --   AST 22  --  22  --  26  --   --   ALT 38  --  27  --  31  --   --   ALKPHOS 69  --  54  --  53  --   --   BILITOT 1.0  --  1.1  --  1.2  --   --   ALBUMIN 3.6  --  3.4*  --  3.2*  --   --   CRP  --   --   --   --  <0.5  --  <0.5  PROCALCITON  --   --   --   --  <0.10  --  <0.10  TSH  --  0.680  --   --   --  0.953  --   AMMONIA  --  22  --   --   --   --   --   BNP  --   --   --  11.7  --  9.8 14.2  MG  --   --   --   --   --   --  1.9  PHOS  --   --   --   --   --   --  2.1*  CALCIUM  9.0  --  8.7*  --  8.7*  --   --       Recent Labs  Lab 09/15/23 1038 09/15/23 1109 09/18/23 1149 09/18/23 1330 09/19/23 0346 09/19/23 0539 09/20/23 0343  CRP  --   --   --   --  <  0.5  --  <0.5  PROCALCITON  --   --   --   --  <0.10  --  <0.10  TSH  --  0.680  --   --   --  0.953  --   AMMONIA  --  22  --   --   --   --   --   BNP  --   --   --  11.7  --  9.8 14.2  MG  --   --   --   --   --   --  1.9  CALCIUM  9.0  --  8.7*  --  8.7*  --   --     --------------------------------------------------------------------------------------------------------------- No results found for: "CHOL", "HDL", "LDLCALC", "LDLDIRECT", "TRIG", "CHOLHDL"  Lab Results  Component Value Date   HGBA1C 8.9 (H) 08/25/2023   Recent Labs    09/19/23 0539  TSH 0.953  FREET4 1.03   Recent Labs    09/19/23 0539  VITAMINB12 1,194*    ------------------------------------------------------------------------------------------------------------------ Cardiac Enzymes No results for input(s): "CKMB", "TROPONINI", "MYOGLOBIN" in the last 168 hours.  Invalid input(s): "CK"  Micro Results Recent Results (from the past 240 hours)  Blood culture (routine x 2)     Status: None (Preliminary result)   Collection Time: 09/18/23  2:51 PM   Specimen: BLOOD  Result Value Ref Range Status   Specimen Description BLOOD LEFT ANTECUBITAL  Final   Special Requests   Final    BOTTLES DRAWN AEROBIC AND ANAEROBIC Blood Culture adequate volume   Culture   Final    NO GROWTH < 24 HOURS Performed at [facility], [address], [place], [place] [address]    Report Status PENDING  Incomplete  Blood culture (routine x 2)     Status: None (Preliminary result)   Collection Time: 09/18/23  3:50 PM   Specimen: BLOOD LEFT HAND  Result Value Ref Range Status   Specimen Description BLOOD LEFT HAND  Final   Special Requests   Final    BOTTLES DRAWN AEROBIC AND ANAEROBIC Blood Culture results may not be optimal due to an inadequate volume of blood received in culture bottles   Culture   Final    NO GROWTH < 24 HOURS Performed at [facility], [address], [place], [place] [address]    Report Status PENDING  Incomplete    Radiology Report DG Chest Port 1 View Result Date: 09/20/2023 CLINICAL DATA:  Shortness of breath EXAM: PORTABLE CHEST 1 VIEW COMPARISON:  Chest x-ray performed September 19, 2023 FINDINGS: Similar appearance of interstitial airspace opacities. Heart mediastinum are not significantly changed. No definite effusion or pneumothorax. IMPRESSION: 1. Similar appearance of interstitial airspace opacities, most likely sequelae of edema. 2. Low lung volumes. Electronically Signed   By: Reagan Camera M.D.   On: 09/20/2023 07:14   DG Chest Port 1 View Result Date: 09/19/2023 EXAM: 1 VIEW XRAY OF THE CHEST 09/19/2023 06:32:00 AM  COMPARISON: 1 view chest x-ray 09/18/2023. CLINICAL HISTORY: 141880 SOB (shortness of breath) 141880. Reason for exam: SOB. However, pt denies sob/cp at this time. Pt C/O productive cough FINDINGS: LUNGS AND PLEURA: Increasing interstitial and airspace opacities likely reflect edema. Bibasilar atelectasis. No pleural effusion. No pneumothorax. HEART AND MEDIASTINUM: No acute abnormality of the cardiac and mediastinal silhouettes. BONES AND SOFT TISSUES: No acute osseous abnormality. IMPRESSION: 1. Increasing interstitial and airspace opacities, likely reflecting edema and congestive heart failure. 2. Bibasilar atelectasis. Electronically signed  by: Audree Leas MD 09/19/2023 06:45 AM EDT RP Workstation: [id number]   DG Chest Port 1 View Result Date: 09/18/2023 CLINICAL DATA:  Shortness of breath EXAM: PORTABLE CHEST 1 VIEW COMPARISON:  Chest radiograph dated 09/15/2023 FINDINGS: Low lung volumes with bronchovascular crowding. Bibasilar patchy opacities, slightly decreased on the right. No pleural effusion or pneumothorax. Similar cardiomediastinal silhouette. No acute osseous abnormality. IMPRESSION: Low lung volumes with bronchovascular crowding. Bibasilar patchy opacities, slightly decreased on the right, may reflect a combination of atelectasis and pneumonia. Electronically Signed   By: Limin  Xu M.D.   On: 09/18/2023 13:30     Signature  -   Lynnwood Sauer M.D on 09/20/2023 at 8:21 AM   -  To page go to [email or web address]

## 2023-09-20 NOTE — Progress Notes (Signed)
 Speech Language Pathology Treatment: Dysphagia  Patient Details Name: Bradley Mcclure MRN: [id number] DOB: January 10, 1961 Today's Date: 09/20/2023 Time: 4098-1191 SLP Time Calculation (min) (ACUTE ONLY): 15 min  Assessment / Plan / Recommendation Clinical Impression  Pt seen for follow up after swallow assessment yesterday. Although pt has history of cognitive impairment he independently recalled chin tuck strategy, may have referred to sign on wall and performed during straw sips liquid consistently throughout session. Once he needed cues to tuck tighter. He states he has been using the chin tuck when he drinks. He was noted to be somewhat impulsive when eating graham crackers needing cues to slow rate and swallow before placing next bite in mouth. He denied pharyngeal globus sensation. There were no s/s aspiration with thorough mastication. Recommend continue regular texture, thin liquids with chin tuck and pills whole in applesauce. Pt advised to take his sign with him on discharge to help remind him of strategy. No further ST needed at this time.    HPI HPI: 63 y.o. male with past medical history of veteran, with hx of  (Prior cricopharyngeal narrowing), PE on anticoagulation, diabetes type 2, hypertension, hyperlipidemia, NAFLD, depression, cognitive impairment, GERD, presenting today for shortness of breath patient was recently admitted on the eighth in May and discharged on 27 May after an extended inpatient stay for his previous admission for dysphagia.  CXR Increasing interstitial and airspace opacities, likely reflecting edema and congestive heart failure. MD assessment includes pna. MBS 08/30/2023 with cervical osteophytes impacting epigottic inversion. Silent penetration of thin and nectar with chin tuck preventing penetration with thin and recommendation for regular, thin liquids with chin tuck.      SLP Plan  All goals met;Discharge SLP treatment due to (comment)      Recommendations for  follow up therapy are one component of a multi-disciplinary discharge planning process, led by the attending physician.  Recommendations may be updated based on patient status, additional functional criteria and insurance authorization.    Recommendations  Diet recommendations: Regular;Thin liquid Liquids provided via: Cup;Straw Medication Administration: Whole meds with puree Supervision: Patient able to self feed Compensations: Slow rate;Small sips/bites;Chin tuck (chin tuck with liquids) Postural Changes and/or Swallow Maneuvers: Seated upright 90 degrees;Out of bed for meals                  Oral care BID   Intermittent Supervision/Assistance Dysphagia, unspecified (R13.10)     All goals met;Discharge SLP treatment due to (comment)     Naomia Bachelor  09/20/2023, 11:20 AM

## 2023-09-20 NOTE — Inpatient Diabetes Management (Signed)
 Inpatient Diabetes Program Recommendations  AACE/ADA: New Consensus Statement on Inpatient Glycemic Control (2015)  Target Ranges:  Prepandial:   less than 140 mg/dL      Peak postprandial:   less than 180 mg/dL (1-2 hours)      Critically ill patients:  140 - 180 mg/dL   Lab Results  Component Value Date   GLUCAP 358 (H) 09/20/2023   HGBA1C 8.9 (H) 08/25/2023    Review of Glycemic Control  Latest Reference Range & Units 09/19/23 11:47 09/19/23 16:55 09/19/23 21:12 09/20/23 07:50  Glucose-Capillary 70 - 99 mg/dL 191 (H) 478 (H) 295 (H) 358 (H)  (H): Data is abnormally high  Diabetes history: DM2 Outpatient Diabetes medications: Lantus  18 units every day, Semaglutide 2 mg weekly, Metformin  1000 mg BID Current orders for Inpatient glycemic control: Semglee  30 units every day, Novolog  0-15 units TID and 0-5 units QHS  Received referral for DM and insulin  teaching.  The DM team spoke with him during last admission on 5/28 & 5/29 regarding A1C, DM management; patient is familiar with insulin  and uses insulin  at home.    Thank you, Hays Lipschutz, MSN, CDCES Diabetes Coordinator Inpatient Diabetes Program [phone] (team pager from 8a-5p)

## 2023-09-21 DIAGNOSIS — J9601 Acute respiratory failure with hypoxia: Secondary | ICD-10-CM | POA: Diagnosis not present

## 2023-09-21 LAB — GLUCOSE, CAPILLARY
Glucose-Capillary: 257 mg/dL — ABNORMAL HIGH (ref 70–99)
Glucose-Capillary: 223 mg/dL — ABNORMAL HIGH (ref 70–99)
Glucose-Capillary: 206 mg/dL — ABNORMAL HIGH (ref 70–99)
Glucose-Capillary: 215 mg/dL — ABNORMAL HIGH (ref 70–99)

## 2023-09-21 LAB — MAGNESIUM: Magnesium: 1.7 mg/dL (ref 1.7–2.4)

## 2023-09-21 MED ORDER — MAGNESIUM SULFATE 2 GM/50ML IV SOLN
2.0000 g | Freq: Once | INTRAVENOUS | Status: AC
  Administered 2023-09-21: 2 g via INTRAVENOUS
  Filled 2023-09-21: qty 50

## 2023-09-21 NOTE — Progress Notes (Signed)
 PROGRESS NOTE                                                                                                                                                                                                             Patient Demographics:    Bradley Mcclure, is a 63 y.o. male, DOB - June 06, 1960, ZOX:[id number]  Outpatient Primary MD for the patient is [name]    LOS - 2  Admit date - 09/18/2023    Chief Complaint  Patient presents with   Shortness of Breath       Brief Narrative (HPI from H&P)    63 y.o. male with past medical history  of  veteran, with hx of esophageal dysphagia (? Prior cricopharyngeal narrowing), cognitive dysphagia, PE on anticoagulation, diabetes type 2, hypertension, hyperlipidemia, NAFLD, depression, cognitive impairment, GERD, presenting today for shortness of breath patient was recently admitted on the eighth in May and discharged on 27 May after an extended inpatient stay for his previous admission for dysphagia.  He was still quite sick but was adamant that he needed to be discharged last time on oxygen, now presents with shortness of breath currently requiring 3 to 4 L of oxygen and admitted for hypoxia.   Subjective:   Patient in bed, appears comfortable, denies any headache, no fever, no chest pain or pressure, no shortness of breath , no abdominal pain. No focal weakness.   Assessment  & Plan :   Acute hypoxic respiratory failure (HCC) likely due to acute on chronic diastolic CHF EF 60%. Been having issues with hypoxia for several weeks starting last admission, clinically appeared to have CHF, IV fluids and steroids were stopped on 09/19/2023, was started on moderate dose IV Lasix  with improvement, encouraged to sit in chair use I-S and flutter valve for pulmonary toiletry, advance activity and titrate on oxygen.  Was on 4 L nasal oxygen on 09/19/2022, currently down to 1-2 L.   Continue to diurese and monitor, cleared by speech therapy, no aspiration.  Mild cognitive impairment of uncertain or unknown etiology No meds or formal eval by neuropsychiatry in chart. Will monitor.  Stable TSH, RPR and B12, thereafter outpatient neurology follow-up, no headache or focal deficits  GERD (gastroesophageal reflux disease)  PPI.  Pulmonary embolism [facility])  Patient has a history of PE and is already on  Xarelto .  Pneumonia  Speech eval was unremarkable, inflammatory markers stable, off antibiotics after a total of 4-day course.  Hypertension currently stable off of medications.  Diabetes mellitus without complication (HCC)   Placed on Semglee  along with sliding scale, monitor and adjust, diabetic and insulin  education.  Dose adjusted on 09/20/2022.  Lab Results  Component Value Date   HGBA1C 8.9 (H) 08/25/2023   CBG (last 3)  Recent Labs    09/20/23 1603 09/20/23 2111 09/21/23 0750  GLUCAP 225* 193* 257*          Condition - Fair  Family Communication  :  None  Code Status :  Full  Consults  :  None  PUD Prophylaxis :     Procedures  :     TTE -08/31/2023.  1. Left ventricular ejection fraction, by estimation, is 60 to 65%. The  left ventricle has normal function. The left ventricle has no regional  wall motion abnormalities. There is mild left ventricular hypertrophy of  the basal-septal segment. Left  ventricular diastolic parameters were normal.   2. Right ventricular systolic function is normal. The right ventricular  size is normal. Tricuspid regurgitation signal is inadequate for assessing  PA pressure.   3. The mitral valve is normal in structure. No evidence of mitral valve  regurgitation. No evidence of mitral stenosis.   4. The aortic valve is tricuspid. Aortic valve regurgitation is not  visualized. Aortic valve sclerosis/calcification is present, without any  evidence of aortic stenosis. Aortic valve Vmax measures 1.48 m/s.         Disposition Plan  :    Status is: Observation   DVT Prophylaxis  :     rivaroxaban  (XARELTO ) tablet 20 mg    Lab Results  Component Value Date   PLT 165 09/20/2023    Diet :  Diet Order             Diet regular Room service appropriate? Yes with Assist; Fluid consistency: Thin  Diet effective now                    Inpatient Medications  Scheduled Meds:  ARIPiprazole   2 mg Oral Daily   FLUoxetine   20 mg Oral BID   insulin  aspart  0-15 Units Subcutaneous TID WC   insulin  aspart  0-5 Units Subcutaneous QHS   insulin  glargine-yfgn  30 Units Subcutaneous Daily   metoprolol  tartrate  25 mg Oral BID   pantoprazole   40 mg Oral Daily   rivaroxaban   20 mg Oral Q supper   rosuvastatin   10 mg Oral Daily   Continuous Infusions:  ceFEPime  (MAXIPIME ) IV 2 g (09/21/23 0126)   PRN Meds:.acetaminophen  **OR** acetaminophen , sodium chloride  flush    Objective:   Vitals:   09/21/23 0500 09/21/23 0732 09/21/23 0743 09/21/23 0750  BP:    (!) 120/57  Pulse:    84  Resp:    (!) 21  Temp:    (!) 97.5 F (36.4 C)  TempSrc:    Oral  SpO2:  90% 91% 91%  Weight: 100.2 kg       Wt Readings from Last 3 Encounters:  09/21/23 100.2 kg  09/14/23 96.5 kg  09/19/22 95.1 kg     Intake/Output Summary (Last 24 hours) at 09/21/2023 0836 Last data filed at 09/20/2023 1726 Gross per 24 hour  Intake 1457.64 ml  Output 2115 ml  Net -657.36 ml     Physical Exam  Awake Alert, No new  F.N deficits, Normal affect [place].AT,PERRAL Supple Neck, No JVD,   Symmetrical Chest wall movement, Good air movement bilaterally, +ve rales RRR,No Gallops,Rubs or new Murmurs,  +ve B.Sounds, Abd Soft, No tenderness,   No Cyanosis, trace edema     Data Review:    Recent Labs  Lab 09/15/23 1038 09/18/23 1330 09/19/23 0346 09/20/23 0343  WBC 14.2* 11.2* 10.4 11.6*  HGB 15.5 14.8 15.1 14.9  HCT 46.9 44.0 45.8 43.9  PLT 195 155 152 165  MCV 93.4 94.4 94.6 92.0  MCH 30.9 31.8 31.2 31.2   MCHC 33.0 33.6 33.0 33.9  RDW 13.2 13.4 13.6 13.3  LYMPHSABS 4.3* 3.3  --  2.7  MONOABS 1.1* 0.9  --  1.0  EOSABS 0.1 0.1  --  0.1  BASOSABS 0.1 0.0  --  0.1    Recent Labs  Lab 09/15/23 1038 09/15/23 1109 09/18/23 1149 09/18/23 1330 09/19/23 0346 09/19/23 0539 09/20/23 0343 09/21/23 0433  NA 135  --  137  --  138  --  133*  --   K 3.5  --  3.5  --  3.9  --  3.8  --   CL 95*  --  100  --  104  --  101  --   CO2 31  --  26  --  29  --  24  --   ANIONGAP 9  --  11  --  5  --  8  --   GLUCOSE 200*  --  213*  --  118*  --  262*  --   BUN 18  --  9  --  6  --  11  --   CREATININE 0.73  --  0.76  --  0.69  --  0.78  --   AST 22  --  22  --  26  --   --   --   ALT 38  --  27  --  31  --   --   --   ALKPHOS 69  --  54  --  53  --   --   --   BILITOT 1.0  --  1.1  --  1.2  --   --   --   ALBUMIN 3.6  --  3.4*  --  3.2*  --   --   --   CRP  --   --   --   --  <0.5  --  <0.5  --   PROCALCITON  --   --   --   --  <0.10  --  <0.10  --   TSH  --  0.680  --   --   --  0.953  --   --   AMMONIA  --  22  --   --   --   --   --   --   BNP  --   --   --  11.7  --  9.8 14.2  --   MG  --   --   --   --   --   --  1.9 1.7  PHOS  --   --   --   --   --   --  2.1*  --   CALCIUM  9.0  --  8.7*  --  8.7*  --  8.8*  --       Recent Labs  Lab 09/15/23 1038 09/15/23 1109 09/18/23 1149 09/18/23 1330 09/19/23  5409 09/19/23 0539 09/20/23 0343 09/21/23 0433  CRP  --   --   --   --  <0.5  --  <0.5  --   PROCALCITON  --   --   --   --  <0.10  --  <0.10  --   TSH  --  0.680  --   --   --  0.953  --   --   AMMONIA  --  22  --   --   --   --   --   --   BNP  --   --   --  11.7  --  9.8 14.2  --   MG  --   --   --   --   --   --  1.9 1.7  CALCIUM  9.0  --  8.7*  --  8.7*  --  8.8*  --     --------------------------------------------------------------------------------------------------------------- No results found for: "CHOL", "HDL", "LDLCALC", "LDLDIRECT", "TRIG", "CHOLHDL"  Lab Results   Component Value Date   HGBA1C 8.9 (H) 08/25/2023   Recent Labs    09/19/23 0539  TSH 0.953  FREET4 1.03   Recent Labs    09/19/23 0539  VITAMINB12 1,194*   ------------------------------------------------------------------------------------------------------------------ Cardiac Enzymes No results for input(s): "CKMB", "TROPONINI", "MYOGLOBIN" in the last 168 hours.  Invalid input(s): "CK"  Micro Results Recent Results (from the past 240 hours)  Blood culture (routine x 2)     Status: None (Preliminary result)   Collection Time: 09/18/23  2:51 PM   Specimen: BLOOD  Result Value Ref Range Status   Specimen Description BLOOD LEFT ANTECUBITAL  Final   Special Requests   Final    BOTTLES DRAWN AEROBIC AND ANAEROBIC Blood Culture adequate volume   Culture   Final    NO GROWTH 2 DAYS Performed at [facility], [address] [place], [place] [address]    Report Status PENDING  Incomplete  Blood culture (routine x 2)     Status: None (Preliminary result)   Collection Time: 09/18/23  3:50 PM   Specimen: BLOOD LEFT HAND  Result Value Ref Range Status   Specimen Description BLOOD LEFT HAND  Final   Special Requests   Final    BOTTLES DRAWN AEROBIC AND ANAEROBIC Blood Culture results may not be optimal due to an inadequate volume of blood received in culture bottles   Culture   Final    NO GROWTH 2 DAYS Performed at [facility], [address] [place], [place] [address]    Report Status PENDING  Incomplete    Radiology Report DG Chest Port 1 View Result Date: 09/20/2023 CLINICAL DATA:  Shortness of breath EXAM: PORTABLE CHEST 1 VIEW COMPARISON:  Chest x-ray performed September 19, 2023 FINDINGS: Similar appearance of interstitial airspace opacities. Heart mediastinum are not significantly changed. No definite effusion or pneumothorax. IMPRESSION: 1. Similar appearance of interstitial airspace opacities, most likely sequelae of edema. 2. Low lung volumes. Electronically  Signed   By: Reagan Camera M.D.   On: 09/20/2023 07:14     Signature  -   Lynnwood Sauer M.D on 09/21/2023 at 8:36 AM   -  To page go to [email or web address]

## 2023-09-21 NOTE — Progress Notes (Signed)
 BP is 100/57, P 85 RN messaged MD on call to see if Lopressor  needs to be given tonight.   Clarise Crooks Crucita Lacorte

## 2023-09-22 ENCOUNTER — Other Ambulatory Visit (HOSPITAL_COMMUNITY): Payer: Self-pay

## 2023-09-22 DIAGNOSIS — J9601 Acute respiratory failure with hypoxia: Secondary | ICD-10-CM | POA: Diagnosis not present

## 2023-09-22 LAB — CBC WITH DIFFERENTIAL/PLATELET
Abs Immature Granulocytes: 0.05 10*3/uL (ref 0.00–0.07)
Basophils Absolute: 0 10*3/uL (ref 0.0–0.1)
Basophils Relative: 1 %
Eosinophils Absolute: 0.2 10*3/uL (ref 0.0–0.5)
Eosinophils Relative: 2 %
HCT: 40.2 % (ref 39.0–52.0)
Hemoglobin: 13.5 g/dL (ref 13.0–17.0)
Immature Granulocytes: 1 %
Lymphocytes Relative: 26 %
Lymphs Abs: 2.3 10*3/uL (ref 0.7–4.0)
MCH: 31.4 pg (ref 26.0–34.0)
MCHC: 33.6 g/dL (ref 30.0–36.0)
MCV: 93.5 fL (ref 80.0–100.0)
Monocytes Absolute: 0.7 10*3/uL (ref 0.1–1.0)
Monocytes Relative: 8 %
Neutro Abs: 5.5 10*3/uL (ref 1.7–7.7)
Neutrophils Relative %: 62 %
Platelets: 139 10*3/uL — ABNORMAL LOW (ref 150–400)
RBC: 4.3 MIL/uL (ref 4.22–5.81)
RDW: 14 % (ref 11.5–15.5)
WBC: 8.9 10*3/uL (ref 4.0–10.5)
nRBC: 0 % (ref 0.0–0.2)

## 2023-09-22 LAB — MAGNESIUM: Magnesium: 1.7 mg/dL (ref 1.7–2.4)

## 2023-09-22 LAB — BASIC METABOLIC PANEL WITH GFR
Anion gap: 8 (ref 5–15)
BUN: 10 mg/dL (ref 6–20)
CO2: 24 mmol/L (ref 22–32)
Calcium: 8.6 mg/dL — ABNORMAL LOW (ref 8.9–10.3)
Chloride: 105 mmol/L (ref 98–111)
Creatinine, Ser: 0.49 mg/dL — ABNORMAL LOW (ref 0.61–1.24)
GFR, Estimated: >60 mL/min (ref >60)
Glucose, Bld: 196 mg/dL — ABNORMAL HIGH (ref 70–99)
Potassium: 3.8 mmol/L (ref 3.5–5.1)
Sodium: 137 mmol/L (ref 135–145)

## 2023-09-22 LAB — GLUCOSE, CAPILLARY
Glucose-Capillary: 198 mg/dL — ABNORMAL HIGH (ref 70–99)
Glucose-Capillary: 263 mg/dL — ABNORMAL HIGH (ref 70–99)

## 2023-09-22 MED ORDER — POTASSIUM CHLORIDE CRYS ER 20 MEQ PO TBCR
20.0000 meq | EXTENDED_RELEASE_TABLET | Freq: Once | ORAL | Status: AC
  Administered 2023-09-22: 20 meq via ORAL
  Filled 2023-09-22: qty 1

## 2023-09-22 MED ORDER — FUROSEMIDE 40 MG PO TABS: 40.0000 mg | ORAL_TABLET | Freq: Two times a day (BID) | ORAL | 0 refills | Status: DC

## 2023-09-22 MED ORDER — METOPROLOL TARTRATE 25 MG PO TABS: 25.0000 mg | ORAL_TABLET | Freq: Two times a day (BID) | ORAL | 0 refills | Status: DC

## 2023-09-22 MED ORDER — METOPROLOL TARTRATE 25 MG PO TABS
25.0000 mg | ORAL_TABLET | Freq: Two times a day (BID) | ORAL | 0 refills | Status: DC
  Filled 2023-09-22: qty 60, 30d supply, fill #0

## 2023-09-22 MED ORDER — INSULIN GLARGINE 100 UNIT/ML SOLOSTAR PEN
30.0000 [IU] | PEN_INJECTOR | Freq: Every day | SUBCUTANEOUS | 0 refills | Status: DC
  Filled 2023-09-22: qty 9, 30d supply, fill #0

## 2023-09-22 MED ORDER — FUROSEMIDE 40 MG PO TABS
40.0000 mg | ORAL_TABLET | Freq: Two times a day (BID) | ORAL | 0 refills | Status: DC
  Filled 2023-09-22: qty 60, 30d supply, fill #0

## 2023-09-22 MED ORDER — INSULIN ASPART 100 UNIT/ML FLEXPEN: PEN_INJECTOR | SUBCUTANEOUS | 0 refills | Status: DC

## 2023-09-22 MED ORDER — POTASSIUM CHLORIDE CRYS ER 20 MEQ PO TBCR: 20.0000 meq | EXTENDED_RELEASE_TABLET | Freq: Every day | ORAL | 0 refills | Status: DC

## 2023-09-22 MED ORDER — BLOOD GLUCOSE MONITOR SYSTEM W/DEVICE KIT: 1.0000 | PACK | Freq: Three times a day (TID) | 0 refills | Status: DC

## 2023-09-22 MED ORDER — INSULIN ASPART 100 UNIT/ML FLEXPEN
PEN_INJECTOR | SUBCUTANEOUS | 0 refills | Status: DC
  Filled 2023-09-22: qty 3, 30d supply, fill #0

## 2023-09-22 MED ORDER — MAGNESIUM SULFATE 2 GM/50ML IV SOLN
2.0000 g | Freq: Once | INTRAVENOUS | Status: AC
  Administered 2023-09-22: 2 g via INTRAVENOUS
  Filled 2023-09-22: qty 50

## 2023-09-22 MED ORDER — POTASSIUM CHLORIDE CRYS ER 20 MEQ PO TBCR
20.0000 meq | EXTENDED_RELEASE_TABLET | Freq: Every day | ORAL | 0 refills | Status: DC
  Filled 2023-09-22: qty 30, 30d supply, fill #0

## 2023-09-22 MED ORDER — FUROSEMIDE 10 MG/ML IJ SOLN
40.0000 mg | Freq: Once | INTRAMUSCULAR | Status: AC
  Administered 2023-09-22: 40 mg via INTRAVENOUS
  Filled 2023-09-22: qty 4

## 2023-09-22 MED ORDER — INSULIN GLARGINE 100 UNIT/ML SOLOSTAR PEN: 30.0000 [IU] | PEN_INJECTOR | Freq: Every day | SUBCUTANEOUS | 0 refills | Status: AC

## 2023-09-22 NOTE — Discharge Summary (Signed)
 Bradley Mcclure XBJ:[id number] DOB: 12-31-1960 DOA: 09/18/2023  PCP: [name]  Admit date: 09/18/2023  Discharge date: 09/22/2023  Admitted From: Home   Disposition:  Home   Recommendations for Outpatient Follow-up:   Follow up with PCP in 1-2 weeks  PCP Please obtain BMP/CBC, 2 view CXR in 1week,  (see Discharge instructions)   PCP Please follow up on the following pending results: Monitor BMP, weight and diuretic dose closely along with glycemic control.   Home Health: None   Equipment/Devices: None  Consultations: None  Discharge Condition: Stable    CODE STATUS: Full    Diet Recommendation: Heart Healthy low carbohydrate, 1.5 L fluid restriction.    Chief Complaint  Patient presents with   Shortness of Breath     Brief history of present illness from the day of admission and additional interim summary    63 y.o. male with past medical history  of  veteran, with hx of esophageal dysphagia (? Prior cricopharyngeal narrowing), cognitive dysphagia, PE on anticoagulation, diabetes type 2, hypertension, hyperlipidemia, NAFLD, depression, cognitive impairment, GERD, presenting today for shortness of breath patient was recently admitted on the eighth in May and discharged on 27 May after an extended inpatient stay for his previous admission for dysphagia.  He was still quite sick but was adamant that he needed to be discharged last time on oxygen, now presents with shortness of breath currently requiring 3 to 4 L of oxygen and admitted for hypoxia.                                                                  Hospital Course   Acute hypoxic respiratory failure (HCC) likely due to acute on chronic diastolic CHF EF 60%. Been having issues with hypoxia for several weeks starting last admission,  clinically appeared to have CHF, IV fluids and steroids were stopped on 09/19/2023, was started on moderate dose IV Lasix  with improvement, encouraged to sit in chair use I-S and flutter valve for pulmonary toiletry, advance activity and titrate on oxygen.  After adequate diuresis he is now symptom-free on room air initially required 4 L nasal cannula oxygen, home diuretic regimen adjusted, strictly counseled on fluid restriction in the outpatient regimen, postdischarge follow-up with PCP for continued monitoring of BMP, fluid status and diuretic dose, outpatient cardiology follow-up to be arranged by PCP.  Currently he is symptom-free, back to baseline, eager to go home, currently on room air.   Mild cognitive impairment of uncertain or unknown etiology No meds or formal eval by neuropsychiatry in chart. Will monitor.  Stable TSH, RPR and B12, thereafter outpatient neurology follow-up, no headache or focal deficits, this is a chronic problem.   GERD (gastroesophageal reflux disease)  PPI.   Pulmonary embolism [facility])  Patient has a history  of PE and is already on Xarelto .   Pneumonia  Speech eval was unremarkable, inflammatory markers stable, finished antibiotic treatment.  Stable.   Hypertension able on home dose beta-blocker and now diuretic regimen   Diabetes mellitus without complication (HCC)   continue long-acting insulin , sliding scale and home semaglutide as before, CBG stable, requested to check CBGs q. ACH S at home.  Discharge diagnosis     Principal Problem:   Acute hypoxemic respiratory failure (HCC) Active Problems:   Hypertension   Diabetes mellitus without complication (HCC)   Pneumonia   Pulmonary embolism (HCC)   GERD (gastroesophageal reflux disease)   Acute hypoxic respiratory failure (HCC)   Mild cognitive impairment of uncertain or unknown etiology    Discharge instructions    Discharge Instructions     Discharge instructions   Complete by: As directed    Follow  with Primary MD [name] in 7 days   Get CBC, CMP, Magnesium , 2 view Chest X ray -  checked next visit with your primary MD    Activity: As tolerated with Full fall precautions use walker/cane & assistance as needed  Disposition Home   Diet: Heart Healthy low carbohydrate diet, strict 1.5 L fluid restriction per day.  Accuchecks 4 times/day, Once in AM empty stomach and then before each meal. Log in all results and show them to your Prim.MD in 3 days. If any glucose reading is under 80 or above 300 call your Prim MD immidiately. Follow Low glucose instructions for glucose under 80 as instructed.   Special Instructions: If you have smoked or chewed Tobacco  in the last 2 yrs please stop smoking, stop any regular Alcohol  and or any Recreational drug use.  On your next visit with your primary care physician please Get Medicines reviewed and adjusted.  Please request your Prim.MD to go over all Hospital Tests and Procedure/Radiological results at the follow up, please get all Hospital records sent to your Prim MD by signing hospital release before you go home.  If you experience worsening of your admission symptoms, develop shortness of breath, life threatening emergency, suicidal or homicidal thoughts you must seek medical attention immediately by calling 911 or calling your MD immediately  if symptoms less severe.  You Must read complete instructions/literature along with all the possible adverse reactions/side effects for all the Medicines you take and that have been prescribed to you. Take any new Medicines after you have completely understood and accpet all the possible adverse reactions/side effects.   Do not drive when taking Pain medications.  Do not take more than prescribed Pain, Sleep and Anxiety Medications  Wear Seat belts while driving.   Increase activity slowly   Complete by: As directed        Discharge Medications   Allergies as of 09/22/2023        Reactions   Empagliflozin  Other (See Comments)   Acidosis        Medication List     STOP taking these medications    predniSONE  10 MG tablet Commonly known as: DELTASONE        TAKE these medications    Accu-Chek Softclix Lancets lancets 1 each by Other route 3 (three) times daily. Use as directed to check blood sugar. May dispense any manufacturer covered by patient's insurance and fits patient's device.   acetaminophen  325 MG tablet Commonly known as: TYLENOL  Take 650 mg by mouth every 6 (six) hours as needed for mild pain.  ARIPiprazole  2 MG tablet Commonly known as: ABILIFY  Take 2 mg by mouth daily.   Blood Glucose Monitor System w/Device Kit 1 each by Does not apply route 3 (three) times daily. May dispense any manufacturer covered by patient's insurance.   BLOOD GLUCOSE TEST STRIPS Strp 1 each by Does not apply route 3 (three) times daily. Use as directed to check blood sugar. May dispense any manufacturer covered by patient's insurance and fits patient's device.   cyanocobalamin  500 MCG tablet Commonly known as: VITAMIN B12 Take 500 mcg by mouth daily.   FLUoxetine  20 MG capsule Commonly known as: PROZAC  Take 20 mg by mouth 2 (two) times daily as needed (anxiety/depression).   furosemide  40 MG tablet Commonly known as: LASIX  Take 1 tablet (40 mg total) by mouth 2 (two) times daily. What changed: when to take this   insulin  aspart 100 UNIT/ML FlexPen Commonly known as: NOVOLOG  Before each meal 3 times a day, 140-199 - 2 units, 200-250 - 4 units, 251-299 - 6 units,  300-349 - 8 units,  350 or above 10 units. What changed: additional instructions   insulin  glargine 100 UNIT/ML Solostar Pen Commonly known as: LANTUS  Inject 30 Units into the skin daily. What changed:  how much to take how to take this when to take this additional instructions   Insulin  Pen Needle 32G X 4 MM Misc 1 each by Does not apply route 3 (three) times daily. May dispense any  manufacturer covered by patient's insurance.   Lancet Device Misc 1 each by Does not apply route 3 (three) times daily. May dispense any manufacturer covered by patient's insurance.   metFORMIN  500 MG tablet Commonly known as: GLUCOPHAGE  Take 1,000 mg by mouth 2 (two) times daily with a meal.   metoprolol  tartrate 25 MG tablet Commonly known as: LOPRESSOR  Take 1 tablet (25 mg total) by mouth 2 (two) times daily.   pantoprazole  40 MG tablet Commonly known as: PROTONIX  Take 1 tablet (40 mg total) by mouth daily.   polyethylene glycol 17 g packet Commonly known as: MIRALAX  / GLYCOLAX  Take 17 g by mouth daily.   potassium chloride  SA 20 MEQ tablet Commonly known as: KLOR-CON  M Take 1 tablet (20 mEq total) by mouth daily.   rivaroxaban  20 MG Tabs tablet Commonly known as: XARELTO  Take 20 mg by mouth daily with supper.   rosuvastatin  20 MG tablet Commonly known as: CRESTOR  Take 10 mg by mouth daily.   Semaglutide (2 MG/DOSE) 8 MG/3ML Sopn Inject 2 mg into the skin every 7 (seven) days.         Follow-up Information     [facility] Schedule an appointment as soon as possible for a visit in 1 week(s).   Contact information: [address] [place] [address] [phone]         Hugh Madura, MD. Schedule an appointment as soon as possible for a visit in 1 week(s).   Specialty: Cardiology Contact information: [address] [place] [address] [phone]                 Major procedures and Radiology Reports - PLEASE review detailed and final reports thoroughly  -       DG Chest Port 1 View Result Date: 09/20/2023 CLINICAL DATA:  Shortness of breath EXAM: PORTABLE CHEST 1 VIEW COMPARISON:  Chest x-ray performed September 19, 2023 FINDINGS: Similar appearance of interstitial airspace opacities. Heart mediastinum are not significantly changed. No definite effusion or pneumothorax.  IMPRESSION: 1. Similar appearance of  interstitial airspace opacities, most likely sequelae of edema. 2. Low lung volumes. Electronically Signed   By: Reagan Camera M.D.   On: 09/20/2023 07:14   DG Chest Port 1 View Result Date: 09/19/2023 EXAM: 1 VIEW XRAY OF THE CHEST 09/19/2023 06:32:00 AM COMPARISON: 1 view chest x-ray 09/18/2023. CLINICAL HISTORY: 141880 SOB (shortness of breath) 141880. Reason for exam: SOB. However, pt denies sob/cp at this time. Pt C/O productive cough FINDINGS: LUNGS AND PLEURA: Increasing interstitial and airspace opacities likely reflect edema. Bibasilar atelectasis. No pleural effusion. No pneumothorax. HEART AND MEDIASTINUM: No acute abnormality of the cardiac and mediastinal silhouettes. BONES AND SOFT TISSUES: No acute osseous abnormality. IMPRESSION: 1. Increasing interstitial and airspace opacities, likely reflecting edema and congestive heart failure. 2. Bibasilar atelectasis. Electronically signed by: Audree Leas MD 09/19/2023 06:45 AM EDT RP Workstation: [id number]   DG Chest Port 1 View Result Date: 09/18/2023 CLINICAL DATA:  Shortness of breath EXAM: PORTABLE CHEST 1 VIEW COMPARISON:  Chest radiograph dated 09/15/2023 FINDINGS: Low lung volumes with bronchovascular crowding. Bibasilar patchy opacities, slightly decreased on the right. No pleural effusion or pneumothorax. Similar cardiomediastinal silhouette. No acute osseous abnormality. IMPRESSION: Low lung volumes with bronchovascular crowding. Bibasilar patchy opacities, slightly decreased on the right, may reflect a combination of atelectasis and pneumonia. Electronically Signed   By: Limin  Xu M.D.   On: 09/18/2023 13:30   MR BRAIN WO CONTRAST Result Date: 09/15/2023 CLINICAL DATA:  Mental status change EXAM: MRI HEAD WITHOUT CONTRAST TECHNIQUE: Multiplanar, multiecho pulse sequences of the brain and surrounding structures were obtained without intravenous contrast. COMPARISON:  CT head earlier same day. FINDINGS: Brain: No acute infarct. No  evidence of intracranial hemorrhage. T2/FLAIR hyperintensity in the periventricular and subcortical white matter. Branching susceptibility in the left cerebellum suggestive of developmental venous anomaly. No edema, mass effect, or midline shift. Normal appearance of midline structures. The basilar cisterns are patent. No extra-axial fluid collections. Ventricles: Prominence of the lateral ventricles suggestive of underlying parenchymal volume loss. Vascular: Skull base flow voids are visualized. Skull and upper cervical spine: Degenerative changes in the visualized upper cervical spine. The visualized calvarium is unremarkable. Sinuses/Orbits: Postsurgical changes of the right globe. Orbits otherwise unremarkable. Mucous retention cyst in the right sphenoid sinus. Other: Mastoid air cells are clear. IMPRESSION: No acute intracranial abnormality. Mild-to-moderate chronic microvascular ischemic changes. Mild generalized parenchymal volume loss. 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 Electronically Signed   By: Denny Flack M.D.   On: 09/15/2023 18:59   DG Chest Portable 1 View Result Date: 09/15/2023 CLINICAL DATA:  Weakness EXAM: PORTABLE CHEST 1 VIEW COMPARISON:  Chest x-ray performed Sep 07, 2023 FINDINGS: Improvement in interstitial airspace opacities. Improvement in aeration. Heart mediastinum are not significantly changed. No pneumothorax or significant pleural effusion. IMPRESSION: 1. Improvement in basilar aeration. 2. Improvement in interstitial airspace opacities, possibly sequelae of edema. Electronically Signed   By: Reagan Camera M.D.   On: 09/15/2023 12:04   CT Head Wo Contrast Result Date: 09/15/2023 CLINICAL DATA:  Provided history: Mental status change, unknown cause. EXAM: CT HEAD WITHOUT CONTRAST TECHNIQUE: Contiguous axial images were obtained from the base of the skull through the vertex without intravenous contrast. RADIATION DOSE REDUCTION: This exam was performed according  to the departmental dose-optimization program which includes automated exposure control, adjustment of the mA and/or kV according to patient size and/or use of iterative reconstruction technique. COMPARISON:  Head CT 09/06/2022. FINDINGS: Brain: Mild generalized cerebral atrophy. Patchy  and ill-defined hypoattenuation within the cerebral white matter, nonspecific but compatible with mild chronic small vessel ischemic disease. There is no acute intracranial hemorrhage. No demarcated cortical infarct. No extra-axial fluid collection. No evidence of an intracranial mass. No midline shift. Vascular: No hyperdense vessel.  Atherosclerotic calcifications. Skull: No calvarial fracture or aggressive osseous lesion. Sinuses/Orbits: No mass or acute finding within the imaged orbits. Right scleral buckle. Prior right ocular lens replacement. Small mucous retention cyst within the right sphenoid sinus. IMPRESSION: 1. No evidence of an acute intracranial abnormality. 2. Mild parenchymal atrophy and chronic small vessel ischemic disease 3. Small right sphenoid sinus mucous retention cyst. Electronically Signed   By: Bascom Lily D.O.   On: 09/15/2023 11:37   IR US  CHEST Result Date: 09/09/2023 CLINICAL DATA:  Limited ultrasound of the chest prior to possible thoracentesis. EXAM: CHEST ULTRASOUND COMPARISON:  CT scan of the chest done 08/31/2023 FINDINGS: There is no pleural fluid seen on the right. There is consolidated lung visualized IMPRESSION: No pleural fluid.  Thoracentesis not performed. Ultrasound performed by Nicky Barrack, PA-C Electronically Signed   By: Erica Hau M.D.   On: 09/09/2023 09:21   DG CHEST PORT 1 VIEW Result Date: 09/07/2023 CLINICAL DATA:  Hypoxemia EXAM: PORTABLE CHEST 1 VIEW COMPARISON:  Chest x-ray 09/03/2023 FINDINGS: Bilateral airspace disease, left greater than right, has not significantly changed. There is a the a small right pleural effusion. There is no pneumothorax. The  cardiomediastinal silhouette is within normal limits. No acute fractures are seen. IMPRESSION: 1. Stable bilateral airspace disease. 2. Small right pleural effusion. Electronically Signed   By: Tyron Gallon M.D.   On: 09/07/2023 15:52   DG CHEST PORT 1 VIEW Result Date: 09/03/2023 CLINICAL DATA:  Shortness of breath.  History of aspiration EXAM: PORTABLE CHEST 1 VIEW COMPARISON:  08/29/2023 FINDINGS: Patient rotated to the right. Normal heart size. No pleural effusion or pneumothorax. Interstitial and airspace disease again identified bilaterally. Relatively similar in the right upper lobe and right lung base. Felt to be slightly increased in the left mid and upper lung. IMPRESSION: Similar right and progressive left-sided multifocal pneumonia. Electronically Signed   By: Lore Rode M.D.   On: 09/03/2023 12:44   ECHOCARDIOGRAM COMPLETE Result Date: 08/31/2023    ECHOCARDIOGRAM REPORT   Patient Name:   [facility] Date of Exam: 08/31/2023 Medical Rec #:  [id number]          Height:       73.0 in Accession #:    [id number]         Weight:       229.3 lb Date of Birth:  1960-08-05          BSA:          2.280 m Patient Age:    62 years           BP:           131/72 mmHg Patient Gender: M                  HR:           94 bpm. Exam Location:  Inpatient Procedure: 2D Echo, Cardiac Doppler and Color Doppler (Both Spectral and Color            Flow Doppler were utilized during procedure). Indications:    other abnormalities of the heart R00.8  History:        Patient has prior history of Echocardiogram examinations, most  recent 09/07/2022. Risk Factors:Hypertension, Diabetes and                 Dyslipidemia.  Sonographer:    Terrilee Few RCS Referring Phys: [phone] A CALDWELL POWELL JR IMPRESSIONS  1. Left ventricular ejection fraction, by estimation, is 60 to 65%. The left ventricle has normal function. The left ventricle has no regional wall motion abnormalities. There is mild left  ventricular hypertrophy of the basal-septal segment. Left ventricular diastolic parameters were normal.  2. Right ventricular systolic function is normal. The right ventricular size is normal. Tricuspid regurgitation signal is inadequate for assessing PA pressure.  3. The mitral valve is normal in structure. No evidence of mitral valve regurgitation. No evidence of mitral stenosis.  4. The aortic valve is tricuspid. Aortic valve regurgitation is not visualized. Aortic valve sclerosis/calcification is present, without any evidence of aortic stenosis. Aortic valve Vmax measures 1.48 m/s. FINDINGS  Left Ventricle: Left ventricular ejection fraction, by estimation, is 60 to 65%. The left ventricle has normal function. The left ventricle has no regional wall motion abnormalities. The left ventricular internal cavity size was normal in size. There is  mild left ventricular hypertrophy of the basal-septal segment. Left ventricular diastolic parameters were normal. Normal left ventricular filling pressure. Right Ventricle: The right ventricular size is normal. No increase in right ventricular wall thickness. Right ventricular systolic function is normal. Tricuspid regurgitation signal is inadequate for assessing PA pressure. Left Atrium: Left atrial size was normal in size. Right Atrium: Right atrial size was normal in size. Pericardium: There is no evidence of pericardial effusion. Mitral Valve: The mitral valve is normal in structure. No evidence of mitral valve regurgitation. No evidence of mitral valve stenosis. Tricuspid Valve: The tricuspid valve is normal in structure. Tricuspid valve regurgitation is not demonstrated. No evidence of tricuspid stenosis. Aortic Valve: The aortic valve is tricuspid. Aortic valve regurgitation is not visualized. Aortic valve sclerosis/calcification is present, without any evidence of aortic stenosis. Aortic valve peak gradient measures 8.8 mmHg. Pulmonic Valve: The pulmonic valve was  normal in structure. Pulmonic valve regurgitation is not visualized. No evidence of pulmonic stenosis. Aorta: The aortic root is normal in size and structure. Venous: The inferior vena cava was not well visualized. IAS/Shunts: No atrial level shunt detected by color flow Doppler.  LEFT VENTRICLE PLAX 2D LVIDd:         4.00 cm   Diastology LVIDs:         2.70 cm   LV e' medial:    8.16 cm/s LV PW:         1.10 cm   LV E/e' medial:  8.0 LV IVS:        1.20 cm   LV e' lateral:   11.10 cm/s LVOT diam:     2.40 cm   LV E/e' lateral: 5.9 LV SV:         65 LV SV Index:   28 LVOT Area:     4.52 cm  RIGHT VENTRICLE RV S prime:     14.80 cm/s TAPSE (M-mode): 2.5 cm LEFT ATRIUM             Index        RIGHT ATRIUM           Index LA diam:        3.70 cm 1.62 cm/m   RA Area:     13.20 cm LA Vol (A2C):   57.4 ml 25.17 ml/m  RA Volume:  25.95 ml  11.38 ml/m LA Vol (A4C):   69.4 ml 30.44 ml/m LA Biplane Vol: 67.9 ml 29.78 ml/m  AORTIC VALVE AV Area (Vmax): 2.44 cm AV Vmax:        148.00 cm/s AV Peak Grad:   8.8 mmHg LVOT Vmax:      79.70 cm/s LVOT Vmean:     53.500 cm/s LVOT VTI:       0.143 m  AORTA Ao Root diam: 3.50 cm Ao Asc diam:  3.30 cm MITRAL VALVE MV Area (PHT): 4.86 cm    SHUNTS MV Decel Time: 156 msec    Systemic VTI:  0.14 m MV E velocity: 65.50 cm/s  Systemic Diam: 2.40 cm MV A velocity: 81.80 cm/s MV E/A ratio:  0.80 Gaylyn Keas MD Electronically signed by Gaylyn Keas MD Signature Date/Time: 08/31/2023/5:18:13 PM    Final    CT CHEST W CONTRAST Result Date: 08/31/2023 CLINICAL DATA:  Multifocal pneumonia EXAM: CT CHEST WITH CONTRAST TECHNIQUE: Multidetector CT imaging of the chest was performed during intravenous contrast administration. RADIATION DOSE REDUCTION: This exam was performed according to the departmental dose-optimization program which includes automated exposure control, adjustment of the mA and/or kV according to patient size and/or use of iterative reconstruction technique. CONTRAST:   50mL OMNIPAQUE  IOHEXOL  350 MG/ML SOLN COMPARISON:  Chest radiograph dated 08/29/2023, CT chest dated 09/10/2022 FINDINGS: Decreased sensitivity and specificity for detailed findings due to motion artifact. Cardiovascular: Normal heart size. No significant pericardial fluid/thickening. Great vessels are normal in course and caliber. No central pulmonary emboli. Coronary artery calcifications and aortic atherosclerosis. Mediastinum/Nodes: Imaged thyroid gland without nodules meeting criteria for imaging follow-up by size. Mild circumferential mural thickening of the esophagus. 12 mm right hilar lymph node (3:55) and 11 mm left hilar lymph node (3:56). Lungs/Pleura: The central airways are patent. Moderate diffuse bronchial wall thickening. Diffuse, multifocal ground-glass opacities and irregular consolidations. No pneumothorax. Trace bilateral pleural effusions. Upper abdomen: Mildly enlarged spleen measures 13.9 cm. Musculoskeletal: No acute or abnormal lytic or blastic osseous lesions. IMPRESSION: 1. Diffuse, multifocal ground-glass opacities and irregular consolidations, likely multifocal pneumonia. 2. Trace bilateral pleural effusions. 3. Mild circumferential mural thickening of the esophagus, which can be seen in the setting of esophagitis. 4. Mildly enlarged bilateral hilar lymph nodes, likely reactive. 5. Aortic Atherosclerosis (ICD10-I70.0). Coronary artery calcifications. Assessment for potential risk factor modification, dietary therapy or pharmacologic therapy may be warranted, if clinically indicated. Electronically Signed   By: Limin  Xu M.D.   On: 08/31/2023 12:42   CT SOFT TISSUE NECK WO CONTRAST Result Date: 08/30/2023 CLINICAL DATA:  Dysphagia EXAM: CT NECK WITHOUT CONTRAST TECHNIQUE: Multidetector CT imaging of the neck was performed following the standard protocol without intravenous contrast. RADIATION DOSE REDUCTION: This exam was performed according to the departmental dose-optimization  program which includes automated exposure control, adjustment of the mA and/or kV according to patient size and/or use of iterative reconstruction technique. COMPARISON:  None Available. FINDINGS: Pharynx and larynx: Prominent disc osteophytes at C2-3 result in mild impression upon the posterior hypopharynx without significant luminal compromise. No mass lesion or soft tissue swelling identified. Salivary glands: Thinning of the overlying subcutaneous fat and subdermal infiltration superficial to the left parotid gland in keeping with remote trauma or postsurgical change. The parotid and submandibular salivary glands are unremarkable. Thyroid: Normal. Lymph nodes: None enlarged or abnormal density. Vascular: Mild atherosclerotic calcification within the left carotid bifurcation. Limited intracranial: Negative. Visualized orbits: Right scleral banding. Right ocular lens has been removed. No  acute abnormality. Mastoids and visualized paranasal sinuses: Clear. Skeleton: Segmentation anomaly C2-3 with incomplete segmentation. Intervertebral disc space narrowing and endplate remodeling is seen throughout the remainder of the cervical spine in keeping with changes of moderate degenerative disc disease. As noted above, prominent anteriorly oriented disc osteophytes at C2-3 result in mild depression upon the posterior hypopharynx. 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 Milder changes related to posterior disc osteophyte complex ease are seen at C5-6 and C6-7. Uncovertebral arthrosis results in severe bilateral neuroforaminal narrowing at C5-6 and more moderate bilateral neuroforaminal narrowing at C6-7. No acute abnormality. Upper chest: Biapical airspace infiltrate, asymmetrically more severe within the right apex is  better seen on prior CT examination of the chest and is compatible with multifocal pneumonic infiltrate. Other: None IMPRESSION: 1. Prominent anteriorly oriented disc osteophytes at C2-3 result in mild impression upon the posterior hypopharynx without significant luminal compromise. 2. 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 3. Severe bilateral neuroforaminal narrowing at C5-6 and more moderate bilateral neuroforaminal narrowing at C6-7. 4. Biapical airspace infiltrate, asymmetrically more severe within the right apex is better seen on prior CT examination of the chest and is compatible with multifocal pneumonic infiltrate. 5. Thinning of the overlying subcutaneous fat and subdermal infiltration superficial to the left parotid gland in keeping with remote trauma or postsurgical change. Electronically Signed   By: Worthy Heads M.D.   On: 08/30/2023 23:45   DG Swallowing Func-Speech Pathology Result Date: 08/30/2023 Table formatting from the original result was not included. Images from the original result were not included. Modified Barium Swallow Study Patient Details Name: Bradley Mcclure MRN: [id number] Date of Birth: 1960/08/18 Today's Date: 08/30/2023 HPI/PMH: HPI: AMIL BOUWMAN is a 63 y.o. male veteran, with hx of esophageal dysphagia (? Prior cricopharyngeal narrowing), PE on anticoagulation, diabetes type 2, hypertension, hyperlipidemia, NAFLD, depression, cognitive impairment, GERD, who presented after an episode of aspiration.  Reports that around 4 PM he was eating a roast beef sandwich and felt that it was moving slowly through his oropharynx, points at his upper neck.  He started "choking" and coughing.  Drank a soda which felt this helped clear the food and he started breathing better.  He denies any issues with  dysphagia prior to today.  Otherwise reports wheezing for the past 3 days although no history of asthma or COPD, smoking.  Denies any recent fever, chills, cough, rhinorrhea, sore throat, sick contacts.  No chest pain or shortness of breath.  Denies any prior treatment for dysphagia. [facility] with?  Cricopharyngeal narrowing and 2017. No prior endoscopy to his knowledge. Clinical Impression: Pt presents with a mild-moderate oropharyngeal dysphagia c/b reduced base of tongue retraction, incomplete laryngeal closure, absent epiglottic inversion with liquid and diminished sensation.  These deficits resulted in deep, silent penetration of thin and nectar thick liquids.  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  Pt was able to acheive full epiglottic inversion with heavier bolus trials (puree, solid).  Pt's cued cough is fairly  weak.  Cued cough and secondary swallow were partially beneficial for clearance of penetration.  Pt's cough is weak sounding and lacking crispness.  Chin tuck prevented penetration of thin liquid.  It is difficult to determine acute v chronic nature of pt's current presentation.  Presumed osteophytes are not acute, but it appears pt has been able to compensate for their presence prior to this admission.  And this may explain cricopharyngeal narrowing [facility] as in addition to pharyngeal projections at C2-3 there are further presumed osteophytes at the level of the UES.  Bolus was able to pass through and on esophageal sweep there was no retention of contrast.  Would consider consult to address structural issues affecting swallow as pt is relatively young and this will continue to place him at higher risk for aspiration. During pill simulation pill was given whole with puree as compensatory strategies can be  difficult to coordinate with tablet and liquid wash.  Initially pt chewed tablet, but on second attempt was able to swallow tablet whole with puree. Recommend continuing regular texture diet with thin liquid, with use of CHIN TUCK. Administer medications whole with puree. DIGEST Swallow Severity Rating*  Safety: 2  Efficiency: 0  Overall Pharyngeal Swallow Severity: 2 1: mild; 2: moderate; 3: severe; 4: profound *The Dynamic Imaging Grade of Swallowing Toxicity is standardized for the head and neck cancer population, however, demonstrates promising clinical applications across populations to standardize the clinical rating of pharyngeal swallow safety and severity. 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 inversion: Factors that may increase risk of adverse event in presence of aspiration Roderick Civatte & Jessy Morocco 2021): Respiratory or GI disease Swallow Evaluation Recommendations Recommendations: PO diet PO Diet Recommendation: Regular;Thin liquids (Level 0) Liquid Administration via: Cup;Straw Medication Administration: Whole meds with puree Supervision: Intermittent supervision/cueing for swallowing strategies Swallowing strategies  : Chin tuck; Slow rate; Small bites/sips Postural changes: Position pt fully upright for meals Recommended consults:  Consider consult to address structural impairments to swallow (i.e. cervical osteophytes) Treatment Plan Treatment Plan Treatment recommendations: Therapy as outlined in treatment plan below Follow-up recommendations: -- (ST at next level of care) Functional status assessment: Patient has had a recent decline in their functional status and demonstrates the ability to make significant improvements in function in a reasonable and predictable amount of time. Treatment frequency: Min 2x/week Treatment duration: 2 weeks Interventions: Respiratory muscle strength training; Compensatory techniques; Diet toleration management by SLP; Aspiration precaution training  Recommendations Recommendations for follow up therapy are one component of a multi-disciplinary discharge planning process, led by the attending physician.  Recommendations may be updated based on patient status, additional functional criteria and insurance authorization. Assessment: Orofacial Exam: Orofacial Exam Oral Cavity: Oral Hygiene: WFL Oral Cavity - Dentition: Adequate natural dentition Orofacial Anatomy: WFL Oral Motor/Sensory Function: -- (See BSE) Anatomy: Anatomy: Suspected cervical osteophytes Boluses Administered: Boluses Administered Boluses Administered: Thin liquids (Level 0); Mildly thick liquids (Level 2, nectar thick); Moderately thick liquids (Level 3, honey thick); Puree; Solid  Oral Impairment Domain: Oral Impairment Domain Lip Closure: No labial escape Tongue control during bolus hold: Cohesive bolus between tongue to palatal seal Bolus preparation/mastication: Timely and efficient chewing and mashing Bolus transport/lingual motion: Brisk tongue motion Oral residue: Complete oral clearance Location of oral residue : N/A Initiation of pharyngeal swallow : Posterior angle of the ramus  Pharyngeal Impairment Domain: Pharyngeal Impairment Domain Soft palate elevation: No bolus between soft palate (SP)/pharyngeal wall (PW) Laryngeal elevation: Complete superior movement of thyroid  cartilage with complete approximation of arytenoids to epiglottic petiole Anterior hyoid excursion: Complete anterior movement Epiglottic movement: No inversion (With liquids.  Complete inversion with solids) Pharyngeal stripping wave : Present - diminished Pharyngeal contraction (A/P view only): N/A Pharyngoesophageal segment opening: Partial distention/partial duration, partial obstruction of flow Tongue base retraction: Trace column of contrast or air between tongue base and PPW Pharyngeal residue: Collection of residue within or on pharyngeal structures Location of pharyngeal residue: Valleculae  Esophageal  Impairment Domain: Esophageal Impairment Domain Esophageal clearance upright position: Complete clearance, esophageal coating Pill: Pill Consistency administered: Puree Puree: WFL Penetration/Aspiration Scale Score: Penetration/Aspiration Scale Score 1.  Material does not enter airway: Moderately thick liquids (Level 3, honey thick); Puree; Solid; Pill 3.  Material enters airway, remains ABOVE vocal cords and not ejected out: Thin liquids (Level 0); Mildly thick liquids (Level 2, nectar thick) Compensatory Strategies: Compensatory Strategies Compensatory strategies: Yes Chin tuck: Effective Effective Chin Tuck: Thin liquid (Level 0) Other(comment): -- (Cued cough and re-swallow, partially effective)   General Information: Caregiver present: No  Diet Prior to this Study: Regular; Thin liquids (Level 0)   No data recorded  Respiratory Status: WFL   Supplemental O2: None (Room air) (Despite requiring 10L by HFNC at bedside)   No data recorded Behavior/Cognition: Alert; Cooperative Self-Feeding Abilities: Able to self-feed Baseline vocal quality/speech: Normal Volitional Cough: Able to elicit No data recorded Exam Limitations: No limitations Goal Planning: Prognosis for improved oropharyngeal function: Fair Barriers to Reach Goals: -- (Structural changes affecting swallow function) No data recorded No data recorded Consulted and agree with results and recommendations: Patient; Nurse Pain: Pain Assessment Faces Pain Scale: 0 Breathing: 0 End of Session: Start Time:SLP Start Time (ACUTE ONLY): 0919 Stop Time: SLP Stop Time (ACUTE ONLY): 0926 Time Calculation:SLP Time Calculation (min) (ACUTE ONLY): 7 min Charges: SLP Evaluations $ SLP Speech Visit: 1 Visit SLP Evaluations $Swallowing Treatment: 1 Procedure SLP visit diagnosis: SLP Visit Diagnosis: Dysphagia, oropharyngeal phase (R13.12) Past Medical History: Past Medical History: Diagnosis Date  Balanitis 12/24/2015  Cellulitis and abscess 12/24/2015  Generalized anxiety  disorder 09/06/2022  Hyperlipidemia 09/06/2022  Hypertension   IBS (irritable bowel syndrome)   Major depressive disorder 09/06/2022  Mild cognitive disorder 09/06/2022  Dec 07, 2016 Entered By: Rosalita Combe Comment: Medical Disabilty 12.2017 -Jul 10, 2018 Entered By: Aloma Jaksch Comment: diagnosed at [facility]  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  Non-alcoholic fatty liver disease 04/19/2011  Dec 07, 2016 Entered By: Rosalita Combe Comment: Urged to modify lifestyle &amp; Urged again 2018 to begin Coffee 2-4/d for hepatoprotection  Pneumonia 2014-2015 X 1  Pulmonary embolism (HCC) 03/21/2013  Sepsis (HCC) 11/11/2015  Type II diabetes mellitus (HCC)  Past Surgical History: Past Surgical History: Procedure Laterality Date  TEE WITHOUT CARDIOVERSION N/A 11/17/2015  Procedure: TRANSESOPHAGEAL ECHOCARDIOGRAM (TEE);  Surgeon: Maudine Sos, MD;  Location[facility] ENDOSCOPY;  Service: Cardiovascular;  Laterality: N/A;  TONSILLECTOMY  ~ 1977 Elester Grim, MA, CCC-SLP Acute Rehabilitation Services Office: [phone] 08/30/2023, 11:24 AM  DG Chest 2 View Result Date: 08/29/2023 CLINICAL DATA:  Pneumonia EXAM: CHEST - 2 VIEW COMPARISON:  Aug 27, 2023 FINDINGS: Persistent right upper lobe consolidating parenchymal changes correlate with pneumonia. With interval improvement in the previously described infiltrates of the left lung. Heart and mediastinum normal No pleural effusions IMPRESSION: Improving left lung pneumonia persistent right upper lobe pneumonia Electronically Signed   By: Fredrich Jefferson M.D.   On: 08/29/2023 07:33   DG CHEST PORT 1 VIEW Result Date: 08/27/2023 CLINICAL DATA:  Pneumonia EXAM: PORTABLE CHEST 1 VIEW COMPARISON:  Three days prior FINDINGS: Patchy bilateral airspace opacity that has worsened, density greatest at the left base and right upper lobe. Normal heart size and mediastinal contours. No effusion or pneumothorax. IMPRESSION: Worsening bilateral pneumonia. Electronically Signed   By:  Ronnette Coke M.D.   On: 08/27/2023 08:19   DG ESOPHAGUS W SINGLE CM (SOL OR THIN BA) Result Date: 08/25/2023 CLINICAL DATA:  Patient is a 63 y/o male with dysphagia and concern for aspiration. Patient presents for a single contrast esophagram. EXAM: ESOPHAGUS/BARIUM SWALLOW/TABLET STUDY TECHNIQUE: Single contrast examination was performed using thin liquid barium. This exam was performed by [facility] and was supervised and interpreted by Dr. Baldemar Lev FLUOROSCOPY: Radiation Exposure Index (as provided by the fluoroscopic device): 5.9 mGy Kerma COMPARISON:  None Available. FINDINGS: Swallowing: Probable penetration present.  No aspiration visualized. Pharynx: Unremarkable. Esophagus: Normal appearance. Esophageal motility: No evidence of dysmotility. Hiatal Hernia: None visualized Gastroesophageal reflux: None visualized. Ingested 13 mm barium tablet: Not given. Other: None. IMPRESSION: Single contrast esophagram. Study was limited by patient positioning. Electronically Signed   By: Fernando Hoyer M.D.   On: 08/25/2023 14:03   DG Chest Port 1 View Result Date: 08/24/2023 CLINICAL DATA:  Shortness of breath, choking. EXAM: PORTABLE CHEST 1 VIEW COMPARISON:  Chest x-ray 09/08/2022 FINDINGS: There are mild patchy and ill-defined nodular densities in the mid right lung and left mid and lower lung. The costophrenic angles are clear. There is no pneumothorax. The cardiomediastinal silhouette is within normal limits. No acute fractures are seen. IMPRESSION: Mild patchy and ill-defined nodular densities in the mid right lung and left mid and lower lung, suspicious for multifocal pneumonia. Recommend follow-up PA and lateral chest x-ray in 4-6 weeks to confirm resolution. Electronically Signed   By: Tyron Gallon M.D.   On: 08/24/2023 18:30    Micro Results     Recent Results (from the past 240 hours)  Blood culture (routine x 2)     Status: None (Preliminary result)   Collection Time:  09/18/23  2:51 PM   Specimen: BLOOD  Result Value Ref Range Status   Specimen Description BLOOD LEFT ANTECUBITAL  Final   Special Requests   Final    BOTTLES DRAWN AEROBIC AND ANAEROBIC Blood Culture adequate volume   Culture   Final    NO GROWTH 3 DAYS Performed at [facility], [address], [place], [place] [address]    Report Status PENDING  Incomplete  Blood culture (routine x 2)     Status: None (Preliminary result)   Collection Time: 09/18/23  3:50 PM   Specimen: BLOOD LEFT HAND  Result Value Ref Range Status   Specimen Description BLOOD LEFT HAND  Final   Special Requests   Final    BOTTLES DRAWN AEROBIC AND ANAEROBIC Blood Culture results may not be optimal due to an inadequate volume of blood received in culture bottles   Culture   Final    NO GROWTH 3 DAYS Performed at [facility], [address], [place], [place] [address]    Report Status PENDING  Incomplete    Today   Subjective    Bradley Mcclure today has no headache,no chest abdominal pain,no new weakness tingling or numbness, feels much better wants to go home today.     Objective   Blood pressure 114/63, pulse 77, temperature (!) 97.5 F (36.4 C), temperature source Oral, resp. rate 16, weight 100.2 kg,  SpO2 93%.   Intake/Output Summary (Last 24 hours) at 09/22/2023 0802 Last data filed at 09/22/2023 0700 Gross per 24 hour  Intake 860 ml  Output 850 ml  Net 10 ml    Exam  Awake Alert, No new F.N deficits,    [place].AT,PERRAL Supple Neck,   Symmetrical Chest wall movement, Good air movement bilaterally, CTAB RRR,No Gallops,   +ve B.Sounds, Abd Soft, Non tender,  No Cyanosis, Clubbing or edema    Data Review   Recent Labs  Lab 09/15/23 1038 09/18/23 1330 09/19/23 0346 09/20/23 0343 09/22/23 0619  WBC 14.2* 11.2* 10.4 11.6* 8.9  HGB 15.5 14.8 15.1 14.9 13.5  HCT 46.9 44.0 45.8 43.9 40.2  PLT 195 155 152 165 139*  MCV 93.4 94.4 94.6 92.0 93.5  MCH 30.9 31.8 31.2 31.2 31.4   MCHC 33.0 33.6 33.0 33.9 33.6  RDW 13.2 13.4 13.6 13.3 14.0  LYMPHSABS 4.3* 3.3  --  2.7 2.3  MONOABS 1.1* 0.9  --  1.0 0.7  EOSABS 0.1 0.1  --  0.1 0.2  BASOSABS 0.1 0.0  --  0.1 0.0    Recent Labs  Lab 09/15/23 1038 09/15/23 1109 09/18/23 1149 09/18/23 1330 09/19/23 0346 09/19/23 0539 09/20/23 0343 09/21/23 0433 09/22/23 0619  NA 135  --  137  --  138  --  133*  --  137  K 3.5  --  3.5  --  3.9  --  3.8  --  3.8  CL 95*  --  100  --  104  --  101  --  105  CO2 31  --  26  --  29  --  24  --  24  ANIONGAP 9  --  11  --  5  --  8  --  8  GLUCOSE 200*  --  213*  --  118*  --  262*  --  196*  BUN 18  --  9  --  6  --  11  --  10  CREATININE 0.73  --  0.76  --  0.69  --  0.78  --  0.49*  AST 22  --  22  --  26  --   --   --   --   ALT 38  --  27  --  31  --   --   --   --   ALKPHOS 69  --  54  --  53  --   --   --   --   BILITOT 1.0  --  1.1  --  1.2  --   --   --   --   ALBUMIN 3.6  --  3.4*  --  3.2*  --   --   --   --   CRP  --   --   --   --  <0.5  --  <0.5  --   --   PROCALCITON  --   --   --   --  <0.10  --  <0.10  --   --   TSH  --  0.680  --   --   --  0.953  --   --   --   AMMONIA  --  22  --   --   --   --   --   --   --   BNP  --   --   --  11.7  --  9.8 14.2  --   --   MG  --   --   --   --   --   --  1.9 1.7 1.7  PHOS  --   --   --   --   --   --  2.1*  --   --   CALCIUM  9.0  --  8.7*  --  8.7*  --  8.8*  --  8.6*    Total Time in preparing paper work, data evaluation and todays exam - 35 minutes  Signature  -    Lynnwood Sauer M.D on 09/22/2023 at 8:02 AM   -  To page go to [email or web address]

## 2023-09-22 NOTE — Progress Notes (Signed)
 Reviewed AVS, patient expressed understanding of medications, MD follow up reviewed.   Patient states all belongings brought to the hospital at time of admission are accounted for and packed to take home.  Discharge prescriptions sent to [facility] pharmacy per patient request.  Pt transported to Discharge lounge to wait for transportation home.

## 2023-09-22 NOTE — Plan of Care (Signed)
                                      [facility]                            [address] [place], [place] [address]      Bradley Mcclure was admitted to the Hospital on 09/18/2023 and Discharged  09/22/2023 and should be excused from work/school   for 10 days starting from date -  09/18/2023 , may return to work/school without any restrictions.  Call Lynnwood Sauer MD, [facility]  [phone] with questions.  Lynnwood Sauer M.D on 09/22/2023,at 7:55 AM  [facility]   Office  [phone]

## 2023-09-22 NOTE — TOC Transition Note (Addendum)
 Transition of Care [facility]) - Discharge Note   Patient Details  Name: Bradley Mcclure MRN: [id number] Date of Birth: 12-05-1960  [facility] of [facility] CM/SW Contact:  Eusebio High, RN Phone Number: 09/22/2023, 8:49 AM   Clinical Narrative:     Patient will DC to home today. No TOC needs identified. Patient will follow up as directed on AVS     Cab voucher to be provided by Unit / RNCM . Transportation voucher completed and in hard chart            Patient Goals and CMS Choice            Discharge Placement                       Discharge Plan and Services Additional resources added to the After Visit Summary for                                       Social Drivers of Health (SDOH) Interventions SDOH Screenings   Food Insecurity: No Food Insecurity (09/19/2023)  Housing: Low Risk  (09/19/2023)  Transportation Needs: No Transportation Needs (09/19/2023)  Utilities: Not At Risk (09/19/2023)  Tobacco Use: Low Risk  (09/18/2023)     Readmission Risk Interventions     No data to display

## 2023-09-22 NOTE — Discharge Instructions (Signed)
 Follow with Primary [name] in [facility] days   Get CBC, CMP, Magnesium , 2 view Chest X ray -  checked next visit with your primary MD    Activity: As tolerated with Full fall precautions use walker/cane & assistance as needed  Disposition Home   Diet: Heart Healthy low carbohydrate diet, strict 1.5 L fluid restriction per day.  Accuchecks 4 times/day, Once in AM empty stomach and then before each meal. Log in all results and show them to your Prim.MD in 3 days. If any glucose reading is under 80 or above 300 call your Prim MD immidiately. Follow Low glucose instructions for glucose under 80 as instructed.   Special Instructions: If you have smoked or chewed Tobacco  in the last 2 yrs please stop smoking, stop any regular Alcohol  and or any Recreational drug use.  On your next visit with your primary care physician please Get Medicines reviewed and adjusted.  Please request your Prim.MD to go over all Hospital Tests and Procedure/Radiological results at the follow up, please get all Hospital records sent to your Prim MD by signing hospital release before you go home.  If you experience worsening of your admission symptoms, develop shortness of breath, life threatening emergency, suicidal or homicidal thoughts you must seek medical attention immediately by calling 911 or calling your MD immediately  if symptoms less severe.  You Must read complete instructions/literature along with all the possible adverse reactions/side effects for all the Medicines you take and that have been prescribed to you. Take any new Medicines after you have completely understood and accpet all the possible adverse reactions/side effects.   Do not drive when taking Pain medications.  Do not take more than prescribed Pain, Sleep and Anxiety Medications  Wear Seat belts while driving.

## 2023-09-23 LAB — CULTURE, BLOOD (ROUTINE X 2)
Culture: NO GROWTH
Culture: NO GROWTH
Special Requests: ADEQUATE

## 2023-10-18 ENCOUNTER — Telehealth: Payer: Self-pay | Admitting: Cardiology

## 2023-10-18 NOTE — Telephone Encounter (Signed)
 Beth from [facility] [phone]) called to set up pt appt and referral since being seen in ED in June and May. She stated he was advised to schedule an appt with Dr. Jeffrie in 1 week based on the discharge summary. She was informed that we schedule based on availability but pt was added to wait list. She stated she'd been doing this for a long time so a nurse should contact her about getting pt in sooner. Please advise

## 2023-10-18 NOTE — Telephone Encounter (Signed)
 Left voice message to call back 7/2

## 2024-01-19 ENCOUNTER — Encounter: Payer: Self-pay | Admitting: Cardiology

## 2024-01-19 ENCOUNTER — Ambulatory Visit: Attending: Cardiology | Admitting: Cardiology

## 2024-01-19 VITALS — BP 96/72 | HR 94 | Ht 73.0 in | Wt 208.6 lb

## 2024-01-19 DIAGNOSIS — I2782 Chronic pulmonary embolism: Secondary | ICD-10-CM

## 2024-01-19 DIAGNOSIS — I10 Essential (primary) hypertension: Secondary | ICD-10-CM | POA: Diagnosis not present

## 2024-01-19 DIAGNOSIS — E119 Type 2 diabetes mellitus without complications: Secondary | ICD-10-CM

## 2024-01-19 DIAGNOSIS — E7800 Pure hypercholesterolemia, unspecified: Secondary | ICD-10-CM | POA: Diagnosis not present

## 2024-01-19 NOTE — Progress Notes (Signed)
  Cardiology Office Note:  .   Date:  01/19/2024  ID:  SHEENA SIMONIS, DOB Feb 07, 1961, MRN [id number] PCP: [facility], [facility] Providers Cardiologist:  None     History of Present Illness: SABRA   DAISHAUN AYRE is a 63 y.o. male Discussed the use of AI scribe software  History of Present Illness LATREL SZYMCZAK is a 63 year old male with hypertension and chronic diastolic heart failure who presents for evaluation of hypertension.  He is currently on furosemide  to manage fluid retention and Xarelto  for a prior pulmonary embolism.  He has a history of chronic diastolic heart failure with an ejection fraction of 60% as per a previous echocardiogram. He was hospitalized in June 2025 for shortness of breath and was discharged on three to four liters of oxygen. During the hospitalization, he received fluids, steroids, and moderate dose IV Lasix , which led to improvement.  He has a history of esophageal dysphagia, cognitive dysphagia, pulmonary embolism, type 2 diabetes, hyperlipidemia, non-alcoholic fatty liver disease, depression, cognitive impairment, and GERD.  No complaints currently.  Reports excellent breathing.  He is without oxygen.  Doing well.  Weight has been stable.      Studies Reviewed: .        Results DIAGNOSTIC Echocardiogram: Ejection fraction 60% (09/2023) Risk Assessment/Calculations:            Physical Exam:   VS:  BP 96/72   Pulse 94   Ht 6' 1 (1.854 m)   Wt 208 lb 9.6 oz (94.6 kg)   SpO2 94%   BMI 27.52 kg/m    Wt Readings from Last 3 Encounters:  01/19/24 208 lb 9.6 oz (94.6 kg)  09/21/23 220 lb 14.4 oz (100.2 kg)  09/14/23 212 lb 11.9 oz (96.5 kg)    GEN: Well nourished, well developed in no acute distress NECK: No JVD; No carotid bruits CARDIAC: RRR, no murmurs, no rubs, no gallops RESPIRATORY:  Clear to auscultation without rales, wheezing or rhonchi  ABDOMEN: Soft, non-tender, non-distended EXTREMITIES:  No  edema; No deformity   ASSESSMENT AND PLAN: .    Assessment and Plan Assessment & Plan Hypertension Blood pressure is currently low.  96/72. - Discontinue metoprolol . - Now that volume is under excellent control with Lasix  40 mg twice a day, he is not requiring additional blood pressure medication.  Chronic diastolic heart failure with preserved ejection fraction EF 60%. Recent hospitalization in June 2025 for exacerbation improved with IV Lasix . Currently no fluid retention. - Continue current dose of furosemide  40 mg twice a day.  Doing very well.  Pulmonary embolism on anticoagulation Managed with Xarelto . - Continue.  No changes made.  Watch for any signs of bleeding.  Most recent hemoglobin 13.5, creatinine 0.49  Type 2 diabetes mellitus - Hemoglobin A1c 8.9.  Stable.           Signed, Oneil Parchment, MD

## 2024-01-19 NOTE — Patient Instructions (Signed)
 Medication Instructions:  Your physician recommends that you continue on your current medications as directed. Please refer to the Current Medication list given to you today.  *If you need a refill on your cardiac medications before your next appointment, please call your pharmacy*  Lab Work: None ordered  Testing/Procedures: None ordered  Follow-Up: At [facility], you and your health needs are our priority.  As part of our continuing mission to provide you with exceptional heart care, our providers are all part of one team.  This team includes your primary Cardiologist (physician) and Advanced Practice Providers or APPs (Physician Assistants and Nurse Practitioners) who all work together to provide you with the care you need, when you need it.  Your next appointment:   1 year(s)  Provider:   One of our Advanced Practice Providers (APPs): Morse Clause, PA-C  Lamarr Satterfield, NP Miriam Shams, NP  Olivia Pavy, PA-C Josefa Beauvais, NP  Leontine Salen, PA-C Orren Fabry, PA-C  [place], PA-C Ernest Dick, NP  Damien Braver, NP Jon Hails, PA-C  Waddell Donath, PA-C    Dayna Dunn, PA-C  Scott Weaver, PA-C Lum Louis, NP Katlyn West, NP Callie Goodrich, PA-C  Xika Zhao, NP Sheng Haley, PA-C    Kathleen Carlo, PA-C      Thank you for choosing [facility]!!   [phone]

## 2024-05-14 ENCOUNTER — Emergency Department (HOSPITAL_COMMUNITY)

## 2024-05-14 ENCOUNTER — Emergency Department (HOSPITAL_COMMUNITY)
Admission: EM | Admit: 2024-05-14 | Discharge: 2024-05-14 | Disposition: A | Discharge status: Home / Self Care | Attending: Emergency Medicine | Admitting: Emergency Medicine

## 2024-05-14 DIAGNOSIS — Z7901 Long term (current) use of anticoagulants: Secondary | ICD-10-CM | POA: Insufficient documentation

## 2024-05-14 DIAGNOSIS — M25559 Pain in unspecified hip: Secondary | ICD-10-CM

## 2024-05-14 DIAGNOSIS — Z794 Long term (current) use of insulin: Secondary | ICD-10-CM | POA: Insufficient documentation

## 2024-05-14 DIAGNOSIS — W000XXA Fall on same level due to ice and snow, initial encounter: Secondary | ICD-10-CM | POA: Insufficient documentation

## 2024-05-14 DIAGNOSIS — M25551 Pain in right hip: Secondary | ICD-10-CM | POA: Insufficient documentation

## 2024-05-14 DIAGNOSIS — S0003XA Contusion of scalp, initial encounter: Secondary | ICD-10-CM | POA: Insufficient documentation

## 2024-05-14 DIAGNOSIS — W19XXXA Unspecified fall, initial encounter: Secondary | ICD-10-CM

## 2024-05-14 LAB — I-STAT CHEM 8, ED
BUN: 7 mg/dL — ABNORMAL LOW (ref 8–23)
Calcium, Ion: 1.04 mmol/L — ABNORMAL LOW (ref 1.15–1.40)
Chloride: 101 mmol/L (ref 98–111)
Creatinine, Ser: 0.7 mg/dL (ref 0.61–1.24)
Glucose, Bld: 89 mg/dL (ref 70–99)
HCT: 46 % (ref 39.0–52.0)
Hemoglobin: 15.6 g/dL (ref 13.0–17.0)
Potassium: 8.4 mmol/L (ref 3.5–5.1)
Sodium: 133 mmol/L — ABNORMAL LOW (ref 135–145)
TCO2: 27 mmol/L (ref 22–32)

## 2024-05-14 LAB — COMPREHENSIVE METABOLIC PANEL WITH GFR
ALT: 17 U/L (ref 0–44)
AST: 22 U/L (ref 15–41)
Albumin: 3.8 g/dL (ref 3.5–5.0)
Alkaline Phosphatase: 55 U/L (ref 38–126)
Anion gap: 12 (ref 5–15)
BUN: 7 mg/dL — ABNORMAL LOW (ref 8–23)
CO2: 21 mmol/L — ABNORMAL LOW (ref 22–32)
Calcium: 8.7 mg/dL — ABNORMAL LOW (ref 8.9–10.3)
Chloride: 100 mmol/L (ref 98–111)
Creatinine, Ser: 0.49 mg/dL — ABNORMAL LOW (ref 0.61–1.24)
GFR, Estimated: >60 mL/min
Glucose, Bld: 96 mg/dL (ref 70–99)
Potassium: 4.3 mmol/L (ref 3.5–5.1)
Sodium: 134 mmol/L — ABNORMAL LOW (ref 135–145)
Total Bilirubin: 0.7 mg/dL (ref 0.0–1.2)
Total Protein: 6.4 g/dL — ABNORMAL LOW (ref 6.5–8.1)

## 2024-05-14 LAB — CBC WITH DIFFERENTIAL/PLATELET
Abs Immature Granulocytes: 0.03 10*3/uL (ref 0.00–0.07)
Basophils Absolute: 0 10*3/uL (ref 0.0–0.1)
Basophils Relative: 0 %
Eosinophils Absolute: 0.1 10*3/uL (ref 0.0–0.5)
Eosinophils Relative: 1 %
HCT: 46.4 % (ref 39.0–52.0)
Hemoglobin: 15.9 g/dL (ref 13.0–17.0)
Immature Granulocytes: 0 %
Lymphocytes Relative: 20 %
Lymphs Abs: 1.4 10*3/uL (ref 0.7–4.0)
MCH: 31 pg (ref 26.0–34.0)
MCHC: 34.3 g/dL (ref 30.0–36.0)
MCV: 90.4 fL (ref 80.0–100.0)
Monocytes Absolute: 0.4 10*3/uL (ref 0.1–1.0)
Monocytes Relative: 5 %
Neutro Abs: 5 10*3/uL (ref 1.7–7.7)
Neutrophils Relative %: 74 %
Platelets: 66 10*3/uL — ABNORMAL LOW (ref 150–400)
RBC: 5.13 MIL/uL (ref 4.22–5.81)
RDW: 13.5 % (ref 11.5–15.5)
WBC: 6.9 10*3/uL (ref 4.0–10.5)
nRBC: 0 % (ref 0.0–0.2)

## 2024-05-14 LAB — TYPE AND SCREEN
ABO/RH(D): A POS
Antibody Screen: NEGATIVE

## 2024-05-14 LAB — PROTIME-INR
INR: 1.1 (ref 0.8–1.2)
Prothrombin Time: 15 s (ref 11.4–15.2)

## 2024-05-14 LAB — ABO/RH: ABO/RH(D): A POS

## 2024-05-14 NOTE — ED Notes (Signed)
 Light green and blue tubes hemolyzed 2x phlebotomy notified

## 2024-05-14 NOTE — ED Notes (Signed)
 Reassessed ambulation with walker. Significant improvement.

## 2024-05-14 NOTE — ED Notes (Signed)
 Patient walked a few feet with assistance, a little wobbly but states that is his baseline. He doesn't use any devices at home. Would recommend a walker or a cane.

## 2024-05-14 NOTE — TOC CM/SW Note (Signed)
 TOC consult received for [facility] services. Per MD, patient has a FWW at home.  Met with patient at bedside. Patient is independent at baseline, resides alone. He uses his FWW for ambulation, no other DME reported.   Discussed HH services for PT. Patient has previously been on service with [facility] and would like to resume services with them. Referral sent via Epic.   No further CM needs reported at this time.   Merilee Batty, MSN, RN Case Management [phone]

## 2024-05-14 NOTE — ED Provider Notes (Signed)
" °  Physical Exam  BP (!) 146/84   Pulse 94   Temp 98.6 F (37 C)   Resp 20   SpO2 99%   Physical Exam  Procedures  Procedures  ED Course / MDM   Clinical Course as of 05/14/24 1723  Tue May 14, 2024  1518 Assumed care from Dr Laurice. 64 yo M with PE on Eliquis who presented after fall. Imaging negative. Has hx of PE on either xarelto  or coumadin. Awaiting INR.  [RP]  1604 Unable to reach friend Oneil People. [RP]  1604 INR WNL.  I have reviewed the patient's [facility] and [facility] he is [facility]  and not on Coumadin.  His CBC and CMP are unremarkable as well. [RP]  1722 Patient able to ambulate safely with a walker.  Is having some pain that limits his mobility.  Says that his walking has been getting worse recently as well so will have PT come to his home and evaluate him.  Will have him follow-up with his PCP at the [facility] [RP]    Clinical Course User Index [RP] Yolande Lamar BROCKS, MD   Medical Decision Making Amount and/or Complexity of Data Reviewed Labs: ordered. Radiology: ordered.      Yolande Lamar BROCKS, MD 05/14/24 1723  "

## 2024-05-14 NOTE — ED Triage Notes (Addendum)
 BIB [facility] from home for a mechanical fall on ice. Hit his head on ice.Hematoma right side of his head. Slow to respond to questions but alert & oriented.  C-collar in place. R leg rotated no pain on palpation. No LOC. On Warfarin. 18G RAC

## 2024-05-14 NOTE — ED Notes (Signed)
 Patient transported to CT

## 2024-05-14 NOTE — ED Provider Notes (Signed)
 " [place] EMERGENCY DEPARTMENT AT [facility] Provider Note   CSN: [id number] Arrival date & time: 05/14/24  1144     Patient presents with: No chief complaint on file.   Bradley Mcclure is a 64 y.o. male.  {Add pertinent medical, surgical, social history, OB history to HPI:6576} 64 year old male with prior medical history as detailed below presents for evaluation.  Patient slipped and fell on the ice.  He hit his head.  He has hematoma the right scalp.  Patient is slow to respond at baseline.  C-collar applied by EMS.  Patient complains of vague low back pain and right hip pain.  Patient is on anticoagulation with history of PE.  Patient reports that he is on warfarin.  Chart review suggest that he may be on Xarelto .  The history is provided by the patient and medical records.       Prior to Admission medications  Medication Sig Start Date End Date Taking? Authorizing Provider  Accu-Chek Softclix Lancets lancets 1 each by Other route 3 (three) times daily. Use as directed to check blood sugar. May dispense any manufacturer covered by patient's insurance and fits patient's device. 09/14/23   Christobal Guadalajara, MD  acetaminophen  (TYLENOL ) 325 MG tablet Take 650 mg by mouth every 6 (six) hours as needed for mild pain.     [provider]  ARIPiprazole  (ABILIFY ) 2 MG tablet Take 2 mg by mouth daily. 05/11/22   [provider]  Blood Glucose Monitoring Suppl (BLOOD GLUCOSE MONITOR SYSTEM) w/Device KIT 1 each by Does not apply route 3 (three) times daily. May dispense any manufacturer covered by patient's insurance. 09/22/23   Singh, Prashant K, MD  cyanocobalamin  (VITAMIN B12) 500 MCG tablet Take 500 mcg by mouth daily. 08/11/23   [provider]  FLUoxetine  (PROZAC ) 20 MG capsule Take 20 mg by mouth 2 (two) times daily as needed (anxiety/depression). 02/02/22   [provider]  furosemide  (LASIX ) 40 MG tablet Take 1 tablet (40 mg total) by mouth  2 (two) times daily. 09/22/23 01/19/24  Singh, Prashant K, MD  Glucose Blood (BLOOD GLUCOSE TEST STRIPS) STRP 1 each by Does not apply route 3 (three) times daily. Use as directed to check blood sugar. May dispense any manufacturer covered by patient's insurance and fits patient's device. 09/14/23   Christobal Guadalajara, MD  insulin  aspart (NOVOLOG ) 100 UNIT/ML FlexPen Before each meal 3 times a day, 140-199 - 2 units, 200-250 - 4 units, 251-299 - 6 units,  300-349 - 8 units,  350 or above 10 units. 09/22/23   Singh, Prashant K, MD  insulin  glargine (LANTUS ) 100 UNIT/ML Solostar Pen Inject 30 Units into the skin daily. 09/22/23   Singh, Prashant K, MD  Insulin  Pen Needle 32G X 4 MM MISC 1 each by Does not apply route 3 (three) times daily. May dispense any manufacturer covered by patient's insurance. 09/14/23   Christobal Guadalajara, MD  Lancet Device MISC 1 each by Does not apply route 3 (three) times daily. May dispense any manufacturer covered by patient's insurance. 09/14/23   Christobal Guadalajara, MD  metFORMIN  (GLUCOPHAGE ) 500 MG tablet Take 1,000 mg by mouth 2 (two) times daily with a meal.     [provider]  pantoprazole  (PROTONIX ) 40 MG tablet Take 1 tablet (40 mg total) by mouth daily. 09/14/23 01/19/24  Christobal Guadalajara, MD  polyethylene glycol (MIRALAX  / GLYCOLAX ) packet Take 17 g by mouth daily. 03/02/16   Vicci Cornet, MD  potassium  chloride SA (KLOR-CON  M) 20 MEQ tablet Take 1 tablet (20 mEq total) by mouth daily. 09/22/23   Singh, Prashant K, MD  rivaroxaban  (XARELTO ) 20 MG TABS tablet Take 20 mg by mouth daily with supper.    [provider]  rosuvastatin  (CRESTOR ) 20 MG tablet Take 10 mg by mouth daily. 08/23/23   [provider]  Semaglutide, 2 MG/DOSE, 8 MG/3ML SOPN Inject 2 mg into the skin every 7 (seven) days. 08/29/22   [provider]    Allergies: Empagliflozin     Review of Systems  All other systems reviewed and are negative.   Updated Vital Signs There were no vitals taken for this  visit.  Physical Exam Vitals and nursing note reviewed.  Constitutional:      General: He is not in acute distress.    Appearance: He is well-developed.  HENT:     Head: Normocephalic.     Comments: Contusion to the right scalp Eyes:     Conjunctiva/sclera: Conjunctivae normal.  Cardiovascular:     Rate and Rhythm: Normal rate and regular rhythm.     Heart sounds: No murmur heard. Pulmonary:     Effort: Pulmonary effort is normal. No respiratory distress.     Breath sounds: Normal breath sounds.  Abdominal:     Palpations: Abdomen is soft.     Tenderness: There is no abdominal tenderness.  Musculoskeletal:        General: Tenderness present. No swelling.     Cervical back: Neck supple.     Comments: Tender to the right lateral hip  Skin:    General: Skin is warm and dry.     Capillary Refill: Capillary refill takes less than 2 seconds.  Neurological:     Mental Status: He is alert.  Psychiatric:        Mood and Affect: Mood normal.     (all labs ordered are listed, but only abnormal results are displayed) Labs Reviewed  PROTIME-INR  CBC WITH DIFFERENTIAL/PLATELET  COMPREHENSIVE METABOLIC PANEL WITH GFR  I-STAT CHEM 8, ED  TYPE AND SCREEN    EKG: None  Radiology: No results found.  {Document cardiac monitor, telemetry assessment procedure when appropriate:32947} Procedures   Medications Ordered in the ED - No data to display    {Click here for ABCD2, HEART and other calculators REFRESH Note before signing:1}                              Medical Decision Making Amount and/or Complexity of Data Reviewed Labs: ordered. Radiology: ordered.   ***  {Document critical care time when appropriate  Document review of labs and clinical decision tools ie CHADS2VASC2, etc  Document your independent review of radiology images and any outside records  Document your discussion with family members, caretakers and with consultants  Document social determinants of  health affecting pt's care  Document your decision making why or why not admission, treatments were needed:32947:::1}   Final diagnoses:  None    ED Discharge Orders     None        "

## 2024-05-14 NOTE — Discharge Instructions (Signed)
 You were seen for your fall in the emergency department.   At home, please always use your walker while walking.    Check your MyChart online for the results of any tests that had not resulted by the time you left the emergency department.   Follow-up with your primary doctor in 2-3 days regarding your visit.  We have also added a physical therapy consult who may call you shortly about coming to your house  Return immediately to the emergency department if you experience any of the following: Recurrent falls, severe headache, or any other concerning symptoms.    Thank you for visiting our Emergency Department. It was a pleasure taking care of you today.

## 2024-05-16 ENCOUNTER — Inpatient Hospital Stay (HOSPITAL_COMMUNITY)
Admission: EM | Admit: 2024-05-16 | Discharge: 2024-05-24 | Disposition: A | Discharge status: Skilled Nursing Facility | Source: Home / Self Care | Attending: Internal Medicine | Admitting: Internal Medicine

## 2024-05-16 ENCOUNTER — Other Ambulatory Visit: Payer: Self-pay

## 2024-05-16 ENCOUNTER — Emergency Department (HOSPITAL_COMMUNITY)

## 2024-05-16 DIAGNOSIS — R6510 Systemic inflammatory response syndrome (SIRS) of non-infectious origin without acute organ dysfunction: Secondary | ICD-10-CM

## 2024-05-16 DIAGNOSIS — G959 Disease of spinal cord, unspecified: Secondary | ICD-10-CM

## 2024-05-16 DIAGNOSIS — M4802 Spinal stenosis, cervical region: Secondary | ICD-10-CM | POA: Diagnosis present

## 2024-05-16 DIAGNOSIS — R29898 Other symptoms and signs involving the musculoskeletal system: Principal | ICD-10-CM

## 2024-05-16 DIAGNOSIS — M4800 Spinal stenosis, site unspecified: Secondary | ICD-10-CM | POA: Diagnosis present

## 2024-05-16 LAB — COMPREHENSIVE METABOLIC PANEL WITH GFR
ALT: 20 U/L (ref 0–44)
AST: 50 U/L — ABNORMAL HIGH (ref 15–41)
Albumin: 3.8 g/dL (ref 3.5–5.0)
Alkaline Phosphatase: 55 U/L (ref 38–126)
Anion gap: 14 (ref 5–15)
BUN: 16 mg/dL (ref 8–23)
CO2: 22 mmol/L (ref 22–32)
Calcium: 9.1 mg/dL (ref 8.9–10.3)
Chloride: 99 mmol/L (ref 98–111)
Creatinine, Ser: 0.52 mg/dL — ABNORMAL LOW (ref 0.61–1.24)
GFR, Estimated: >60 mL/min
Glucose, Bld: 129 mg/dL — ABNORMAL HIGH (ref 70–99)
Potassium: 3.8 mmol/L (ref 3.5–5.1)
Sodium: 135 mmol/L (ref 135–145)
Total Bilirubin: 0.9 mg/dL (ref 0.0–1.2)
Total Protein: 6.4 g/dL — ABNORMAL LOW (ref 6.5–8.1)

## 2024-05-16 LAB — CBC WITH DIFFERENTIAL/PLATELET
Abs Immature Granulocytes: 0.09 10*3/uL — ABNORMAL HIGH (ref 0.00–0.07)
Basophils Absolute: 0 10*3/uL (ref 0.0–0.1)
Basophils Relative: 0 %
Eosinophils Absolute: 0 10*3/uL (ref 0.0–0.5)
Eosinophils Relative: 0 %
HCT: 42.4 % (ref 39.0–52.0)
Hemoglobin: 15.3 g/dL (ref 13.0–17.0)
Immature Granulocytes: 1 %
Lymphocytes Relative: 6 %
Lymphs Abs: 1.1 10*3/uL (ref 0.7–4.0)
MCH: 31.5 pg (ref 26.0–34.0)
MCHC: 36.1 g/dL — ABNORMAL HIGH (ref 30.0–36.0)
MCV: 87.4 fL (ref 80.0–100.0)
Monocytes Absolute: 1.1 10*3/uL — ABNORMAL HIGH (ref 0.1–1.0)
Monocytes Relative: 6 %
Neutro Abs: 16.1 10*3/uL — ABNORMAL HIGH (ref 1.7–7.7)
Neutrophils Relative %: 87 %
Platelets: 160 10*3/uL (ref 150–400)
RBC: 4.85 MIL/uL (ref 4.22–5.81)
RDW: 13.6 % (ref 11.5–15.5)
WBC: 18.4 10*3/uL — ABNORMAL HIGH (ref 4.0–10.5)
nRBC: 0 % (ref 0.0–0.2)

## 2024-05-16 LAB — RESP PANEL BY RT-PCR (RSV, FLU A&B, COVID)  RVPGX2
Influenza A by PCR: NEGATIVE
Influenza B by PCR: NEGATIVE
Resp Syncytial Virus by PCR: NEGATIVE
SARS Coronavirus 2 by RT PCR: NEGATIVE

## 2024-05-16 LAB — TROPONIN T, HIGH SENSITIVITY: Troponin T High Sensitivity: 12 ng/L (ref 0–19)

## 2024-05-16 LAB — I-STAT CG4 LACTIC ACID, ED: Lactic Acid, Venous: 0.9 mmol/L (ref 0.5–1.9)

## 2024-05-16 LAB — LIPASE, BLOOD: Lipase: 13 U/L (ref 11–51)

## 2024-05-16 LAB — CK: Total CK: 1425 U/L — ABNORMAL HIGH (ref 49–397)

## 2024-05-16 MED ORDER — GADOBUTROL 1 MMOL/ML IV SOLN
9.5000 mL | Freq: Once | INTRAVENOUS | Status: AC | PRN
  Administered 2024-05-16: 9.5 mL via INTRAVENOUS

## 2024-05-16 MED ORDER — ONDANSETRON HCL 4 MG/2ML IJ SOLN
4.0000 mg | Freq: Once | INTRAMUSCULAR | Status: AC
  Administered 2024-05-16: 4 mg via INTRAVENOUS
  Filled 2024-05-16: qty 2

## 2024-05-16 MED ORDER — IOHEXOL 350 MG/ML SOLN
75.0000 mL | Freq: Once | INTRAVENOUS | Status: AC | PRN
  Administered 2024-05-16: 75 mL via INTRAVENOUS

## 2024-05-16 MED ORDER — SODIUM CHLORIDE 0.9 % IV BOLUS
1000.0000 mL | Freq: Once | INTRAVENOUS | Status: AC
  Administered 2024-05-16: 1000 mL via INTRAVENOUS

## 2024-05-16 MED ORDER — LORAZEPAM 1 MG PO TABS
0.5000 mg | ORAL_TABLET | Freq: Once | ORAL | Status: DC
  Filled 2024-05-16: qty 1

## 2024-05-16 MED ORDER — MORPHINE SULFATE (PF) 4 MG/ML IV SOLN
4.0000 mg | Freq: Once | INTRAVENOUS | Status: AC
  Administered 2024-05-16: 4 mg via INTRAVENOUS
  Filled 2024-05-16: qty 1

## 2024-05-16 MED ORDER — LORAZEPAM 1 MG PO TABS
1.0000 mg | ORAL_TABLET | Freq: Once | ORAL | Status: AC
  Administered 2024-05-16: 1 mg via ORAL

## 2024-05-16 NOTE — Progress Notes (Signed)
 64 y/o M w/ hx uncontrolled DM1 A1c 8.9, on Xarelto  who reportedly presents with 3 weeks of worsening gait instability, generalized weakness, and falls. 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 Picture sounds like cervical myelopathy to me. Also with elevated WBC and is pending a sepsis workup  Recommendations Anticipate will need a posterior cervical decompression and fusion Monday or Tuesday once  Hold Xarelto . Ok for therapeutic lovenox  or heparin  in meantime Activity as tolerated. No collar needed Diet as tolerated Admission to hospitalist service for workup of leukocytosis

## 2024-05-16 NOTE — ED Provider Notes (Signed)
 " [place] EMERGENCY DEPARTMENT AT  [facility]OS[facility]ITAL Provider Note   CSN: [id number] Arrival date & time: 05/16/24  9093     Patient presents with: Weakness, Fall, and Sepsis   Bradley Mcclure is a 64 y.o. male.   64 yo M with a chief complaint of generalized fatigue.  He tells me it has been going on for about 3 weeks.  Suffered a fall a few days ago and came to the ED for evaluation.  He said since then he has not really been able to get up and move around well.  Has not felt like eating and drinking.  He does have some neck and back pain has been going on for about 3 weeks as well.  Has had a little bit of cough but denies fevers.  No known sick contacts.  He feels like his legs are just weak.  He denies any chest pain.  Denies abdominal pain.  Denies history of stroke.   Weakness Fall       Prior to Admission medications  Medication Sig Start Date End Date Taking? Authorizing Provider  acetaminophen  (TYLENOL ) 325 MG tablet Take 650 mg by mouth every 6 (six) hours as needed for mild pain.     [provider]  ARIPiprazole  (ABILIFY ) 2 MG tablet Take 2 mg by mouth daily. 05/11/22   [provider]  cyanocobalamin  (VITAMIN B12) 500 MCG tablet Take 500 mcg by mouth daily. 08/11/23   [provider]  FLUoxetine  (PROZAC ) 20 MG capsule Take 20 mg by mouth 2 (two) times daily as needed (anxiety/depression). 02/02/22   [provider]  furosemide  (LASIX ) 40 MG tablet Take 1 tablet (40 mg total) by mouth 2 (two) times daily. 09/22/23 01/19/24  Singh, Prashant K, MD  insulin  aspart (NOVOLOG ) 100 UNIT/ML FlexPen Before each meal 3 times a day, 140-199 - 2 units, 200-250 - 4 units, 251-299 - 6 units,  300-349 - 8 units,  350 or above 10 units. 09/22/23   Singh, Prashant K, MD  insulin  glargine (LANTUS ) 100 UNIT/ML Solostar Pen Inject 30 Units into the skin daily. 09/22/23   Singh, Prashant K, MD  metFORMIN  (GLUCOPHAGE ) 500 MG tablet Take 1,000 mg by mouth 2  (two) times daily with a meal.     [provider]  pantoprazole  (PROTONIX ) 40 MG tablet Take 1 tablet (40 mg total) by mouth daily. 09/14/23 01/19/24  Christobal Guadalajara, MD  polyethylene glycol (MIRALAX  / GLYCOLAX ) packet Take 17 g by mouth daily. 03/02/16   Vicci Cornet, MD  potassium chloride  SA (KLOR-CON  M) 20 MEQ tablet Take 1 tablet (20 mEq total) by mouth daily. 09/22/23   Singh, Prashant K, MD  rivaroxaban  (XARELTO ) 20 MG TABS tablet Take 20 mg by mouth daily with supper.    [provider]  rosuvastatin  (CRESTOR ) 20 MG tablet Take 10 mg by mouth daily. 08/23/23   [provider]  Semaglutide, 2 MG/DOSE, 8 MG/3ML SOPN Inject 2 mg into the skin every 7 (seven) days. 08/29/22   [provider]    Allergies: Empagliflozin     Review of Systems  Neurological:  Positive for weakness.    Updated Vital Signs BP 118/69 (BP Location: Right Arm)   Pulse 97   Temp 98 F (36.7 C) (Oral)   Resp 14   Ht 6' 1 (1.854 m)   Wt 94 kg   SpO2 99%   BMI 27.34 kg/m   Physical Exam Vitals and nursing note reviewed.  Constitutional:      Appearance: He is well-developed.  HENT:     Head: Normocephalic and atraumatic.  Eyes:     Pupils: Pupils are equal, round, and reactive to light.  Neck:     Vascular: No JVD.  Cardiovascular:     Rate and Rhythm: Normal rate and regular rhythm.     Heart sounds: No murmur heard.    No friction rub. No gallop.  Pulmonary:     Effort: No respiratory distress.     Breath sounds: No wheezing.  Abdominal:     General: There is no distension.     Tenderness: There is no abdominal tenderness. There is no guarding or rebound.  Musculoskeletal:        General: Normal range of motion.     Cervical back: Normal range of motion and neck supple.  Skin:    Coloration: Skin is not pale.     Findings: No rash.  Neurological:     Mental Status: He is alert and oriented to person, place, and time.     Comments: R sided weakness.   Bilateral leg weakness worse on the R than the left.   Psychiatric:        Behavior: Behavior normal.     (all labs ordered are listed, but only abnormal results are displayed) Labs Reviewed  CBC WITH DIFFERENTIAL/PLATELET - Abnormal; Notable for the following components:      Result Value   WBC 18.4 (*)    MCHC 36.1 (*)    Neutro Abs 16.1 (*)    Monocytes Absolute 1.1 (*)    Abs Immature Granulocytes 0.09 (*)    All other components within normal limits  CK - Abnormal; Notable for the following components:   Total CK 1,425 (*)    All other components within normal limits  COMPREHENSIVE METABOLIC PANEL WITH GFR - Abnormal; Notable for the following components:   Glucose, Bld 129 (*)    Creatinine, Ser 0.52 (*)    Total Protein 6.4 (*)    AST 50 (*)    All other components within normal limits  LAB REPORT - SCANNED   Narrative:    Ordered by an unspecified provider.  LIPASE, BLOOD  URINALYSIS, ROUTINE W REFLEX MICROSCOPIC  TROPONIN T, HIGH SENSITIVITY    EKG: EKG Interpretation Date/Time:  Thursday May 16 2024 09:11:31 EST Ventricular Rate:  99 PR Interval:  167 QRS Duration:  97 QT Interval:  359 QTC Calculation: 461 R Axis:   48  Text Interpretation: Incomplete analysis due to missing data in precordial lead(s) Sinus rhythm Ventricular premature complex Nonspecific T abnormalities, lateral leads Missing lead(s): V1 TECHNICALLY DIFFICULT Otherwise no significant change Confirmed by Emil Share [phone]) on 05/16/2024 9:58:34 AM  Radiology: CT Head Wo Contrast Result Date: 05/16/2024 EXAM: CT HEAD WITHOUT CONTRAST 05/16/2024 10:43:56 AM TECHNIQUE: CT of the head was performed without the administration of intravenous contrast. Automated exposure control, iterative reconstruction, and/or weight based adjustment of the mA/kV was utilized to reduce the radiation dose to as low as reasonably achievable. COMPARISON: 05/14/2024 CLINICAL HISTORY: Head trauma and abnormal mental  status (Age 62-64y). FINDINGS: BRAIN AND VENTRICLES: No acute hemorrhage. No evidence of acute infarct. No hydrocephalus. No extra-axial collection. No mass effect or midline shift. Nonspecific periventricular and subcortical white matter hypoattenuation, consistent with chronic microvascular ischemic changes. No lesion is evident within the right thalamus. Previously described focal hypodensity likely represents volume averaging with adjacent choroid plexus. Calcified atherosclerotic plaque within  cavernous/supraclinoid internal carotid arteries. ORBITS: Right lens replacement and scleral band. SINUSES: Mucosal thickening in right sphenoid sinus. SOFT TISSUES AND SKULL: No acute soft tissue abnormality. No skull fracture. IMPRESSION: 1. No acute intracranial abnormality. No right thalamic lesion; previously described focal hypodensity likely represents volume averaging with adjacent choroid plexus. 2. Nonspecific periventricular and subcortical white matter hypoattenuation, consistent with chronic microvascular ischemic changes. 3. Mucosal thickening in right sphenoid sinus. Electronically signed by: Evalene Coho MD 05/16/2024 11:15 AM EST RP Workstation: [id number]   DG Chest Port 1 View Result Date: 05/16/2024 EXAM: 1 VIEW(S) XRAY OF THE CHEST 05/16/2024 09:43:00 AM COMPARISON: 05/14/2024 CLINICAL HISTORY: Fatigue. FINDINGS: LUNGS AND PLEURA: No airspace consolidation. Stable chronic peripheral basilar reticular interstitial opacities. No pleural effusion. No pneumothorax. HEART AND MEDIASTINUM: No acute abnormality of the cardiac and mediastinal silhouettes. BONES AND SOFT TISSUES: No acute osseous abnormality. IMPRESSION: 1. No acute cardiopulmonary abnormality. Electronically signed by: Waddell Calk MD 05/16/2024 10:47 AM EST RP Workstation: [id number]     Procedures   Medications Ordered in the ED  sodium chloride  0.9 % bolus 1,000 mL (0 mLs Intravenous Stopped 05/16/24 1227)  sodium chloride   0.9 % bolus 1,000 mL (1,000 mLs Intravenous New Bag/Given 05/16/24 1440)                                    Medical Decision Making Amount and/or Complexity of Data Reviewed Labs: ordered. Radiology: ordered.   64 yo M with a cc of weakness.  Going on for three weeks.  Was seen in the ED about 48 hours ago had suffered a fall reportedly due to his weakness.  He was worked up in the emergency department was able to ambulate with a walker and was discharged home.  He said since he has been home he really has not been able to get up at all.  Has not been able to eat and drink.  He received a call he said from this facility to ask him how he was doing and they called EMS for him.   Patient's blood work without significant cause of his symptoms.  Renal function at his baseline.  No significant elevation in BUN.  He does have mild CK elevation of 1400.  LFTs and lipase are unremarkable.  Leukocytosis of unknown significance.  With patient reportedly having new weakness to both of his legs and has focal right arm weakness for me concern for possible epidural abscess or epidural hematoma.  Will obtain MRI imaging.  Patient care signed out to Dr. Elnor, please see their note for further details of care in the ED.  The patients results and plan were reviewed and discussed.   Any x-rays performed were independently reviewed by myself.   Differential diagnosis were considered with the presenting HPI.  Medications  sodium chloride  0.9 % bolus 1,000 mL (0 mLs Intravenous Stopped 05/16/24 1227)  sodium chloride  0.9 % bolus 1,000 mL (1,000 mLs Intravenous New Bag/Given 05/16/24 1440)    Vitals:   05/16/24 0912 05/16/24 0914 05/16/24 0914 05/16/24 1236  BP:    118/69  Pulse:  97  97  Resp:  (!) 24  14  Temp: (!) 97.4 F (36.3 C)   98 F (36.7 C)  TempSrc: Oral   Oral  SpO2:  97%  99%  Weight:   94 kg   Height:   6' 1 (1.854 m)     Final  diagnoses:  Weakness of both lower extremities     Admission/ observation were discussed with the admitting physician, patient and/or family and they are comfortable with the plan.       Final diagnoses:  Weakness of both lower extremities    ED Discharge Orders     None          Emil Share, DO 05/16/24 1528  "

## 2024-05-16 NOTE — ED Notes (Signed)
 Patient transported to MRI

## 2024-05-16 NOTE — ED Notes (Signed)
 Urinal at bedside.

## 2024-05-16 NOTE — ED Notes (Signed)
 Patient transported to CT

## 2024-05-16 NOTE — ED Notes (Signed)
 X-ray at bedside

## 2024-05-16 NOTE — ED Notes (Signed)
 Patient is resting comfortably.

## 2024-05-16 NOTE — ED Provider Notes (Signed)
" °  Provider Note MRN:  [id number]  Arrival date & time: 05/17/24    ED Course and Medical Decision Making  Assumed care from Dr Emil at shift change.  See note from prior team for complete details, in brief:  Clinical Course as of 05/17/24 0024  Thu May 16, 2024  1537 Handoff DF 63 yo/m Here w/ weakness, fall Wbc 18k, cpk 1400 Was seen recently for a fall Now unable to walk x2 days, not eating/drinking RUE weakness  MRI pending Admit  [SG]  2052 MRI c- spine  IMPRESSION: 1. New mild inflammation in the upper and mid cervical prevertebral soft tissues, of indeterminate etiology. No evidence of an organized abscess or discitis/osteomyelitis. 2. Severe spinal stenosis with severe cord flattening and moderate bilateral neural foraminal stenosis at C4-5. 3. Moderate right-sided spinal stenosis with mild cord flattening and moderate to severe right and moderate left neural foraminal stenosis at C5-6.  ADDENDUM: Given the additional history of a recent fall, the prevertebral swelling could also be traumatic with mild STIR hyperintensity in the C4-5 disc potentially reflecting an element of traumatic disc and ligamentous injury. [SG]  2105 Pt does have some pain on palpation of the area of concern on imaging. He has weakness to BL UE and BLLE with R >L somewhat. Sensation is intact to extremities. No incontinence or overflow. Pt reports that he was having weakness prior to falling on Tuesday evening when he hit his upper back/neck but feels like the weakness has gotten worse since then  Will consult NSGY regarding abn MRI [SG]  2106 Pt now complaining of abdominal pain, generalized. Will get CTAP [SG]  2133 Spoke w/ Dr Darnella Admit Plan OR monday/Tuesday Hold doac  Concern cervical myelopathy  [SG]    Clinical Course User Index [SG] Elnor Jayson LABOR, DO   Handoff DR Midge pending callback from hospitalist   Procedures  Final Clinical Impressions(s) / ED Diagnoses      ICD-10-CM   1. Weakness of both lower extremities  R29.898     2. SIRS (systemic inflammatory response syndrome) (HCC)  R65.10     3. Cervical myelopathy [facility])  G95.9       ED Discharge Orders     None       Discharge Instructions   None        Elnor Jayson LABOR, DO 05/17/24 0024  "

## 2024-05-16 NOTE — ED Notes (Signed)
 Pt uncomfortable at MRI this RN gave ativan .

## 2024-05-16 NOTE — ED Triage Notes (Signed)
 PT BIB [facility] from home d/t fall found on ground for unknown time Weak can't walk C/o neck pain since Tuesday (bruise on right side ribs) Pt states hasn't eat/drink 2 days No loc no head trauma on warfin A/ox4  Hr 104 Bp 124/2 97% ra Cbg 154 24 rr 500 LR 20 left ac

## 2024-05-17 ENCOUNTER — Encounter (HOSPITAL_COMMUNITY): Payer: Self-pay | Admitting: Internal Medicine

## 2024-05-17 ENCOUNTER — Other Ambulatory Visit: Payer: Self-pay

## 2024-05-17 DIAGNOSIS — M4800 Spinal stenosis, site unspecified: Secondary | ICD-10-CM | POA: Diagnosis present

## 2024-05-17 DIAGNOSIS — M4802 Spinal stenosis, cervical region: Secondary | ICD-10-CM | POA: Diagnosis present

## 2024-05-17 LAB — COMPREHENSIVE METABOLIC PANEL WITH GFR
ALT: 20 U/L (ref 0–44)
AST: 45 U/L — ABNORMAL HIGH (ref 15–41)
Albumin: 3.7 g/dL (ref 3.5–5.0)
Alkaline Phosphatase: 54 U/L (ref 38–126)
Anion gap: 11 (ref 5–15)
BUN: 17 mg/dL (ref 8–23)
CO2: 26 mmol/L (ref 22–32)
Calcium: 9.2 mg/dL (ref 8.9–10.3)
Chloride: 99 mmol/L (ref 98–111)
Creatinine, Ser: 0.58 mg/dL — ABNORMAL LOW (ref 0.61–1.24)
GFR, Estimated: >60 mL/min
Glucose, Bld: 118 mg/dL — ABNORMAL HIGH (ref 70–99)
Potassium: 4.1 mmol/L (ref 3.5–5.1)
Sodium: 136 mmol/L (ref 135–145)
Total Bilirubin: 0.9 mg/dL (ref 0.0–1.2)
Total Protein: 6.2 g/dL — ABNORMAL LOW (ref 6.5–8.1)

## 2024-05-17 LAB — CBC WITH DIFFERENTIAL/PLATELET
Abs Immature Granulocytes: 0.05 10*3/uL (ref 0.00–0.07)
Basophils Absolute: 0 10*3/uL (ref 0.0–0.1)
Basophils Relative: 0 %
Eosinophils Absolute: 0 10*3/uL (ref 0.0–0.5)
Eosinophils Relative: 0 %
HCT: 40.2 % (ref 39.0–52.0)
Hemoglobin: 14.1 g/dL (ref 13.0–17.0)
Immature Granulocytes: 0 %
Lymphocytes Relative: 16 %
Lymphs Abs: 2.1 10*3/uL (ref 0.7–4.0)
MCH: 31.4 pg (ref 26.0–34.0)
MCHC: 35.1 g/dL (ref 30.0–36.0)
MCV: 89.5 fL (ref 80.0–100.0)
Monocytes Absolute: 1.1 10*3/uL — ABNORMAL HIGH (ref 0.1–1.0)
Monocytes Relative: 9 %
Neutro Abs: 9.8 10*3/uL — ABNORMAL HIGH (ref 1.7–7.7)
Neutrophils Relative %: 75 %
Platelets: 141 10*3/uL — ABNORMAL LOW (ref 150–400)
RBC: 4.49 MIL/uL (ref 4.22–5.81)
RDW: 14.1 % (ref 11.5–15.5)
WBC: 13.1 10*3/uL — ABNORMAL HIGH (ref 4.0–10.5)
nRBC: 0 % (ref 0.0–0.2)

## 2024-05-17 LAB — HEMOGLOBIN A1C
Hgb A1c MFr Bld: 5.4 % (ref 4.8–5.6)
Hgb A1c MFr Bld: 5.3 % (ref 4.8–5.6)
Mean Plasma Glucose: 108.28 mg/dL
Mean Plasma Glucose: 105.41 mg/dL

## 2024-05-17 LAB — GLUCOSE, CAPILLARY
Glucose-Capillary: 159 mg/dL — ABNORMAL HIGH (ref 70–99)
Glucose-Capillary: 84 mg/dL (ref 70–99)

## 2024-05-17 LAB — CBG MONITORING, ED
Glucose-Capillary: 99 mg/dL (ref 70–99)
Glucose-Capillary: 86 mg/dL (ref 70–99)

## 2024-05-17 LAB — URINALYSIS, ROUTINE W REFLEX MICROSCOPIC
Bacteria, UA: NONE SEEN
Bilirubin Urine: NEGATIVE
Glucose, UA: 50 mg/dL — AB
Hgb urine dipstick: NEGATIVE
Ketones, ur: 20 mg/dL — AB
Leukocytes,Ua: NEGATIVE
Nitrite: NEGATIVE
Protein, ur: 30 mg/dL — AB
Specific Gravity, Urine: >1.06 — ABNORMAL HIGH (ref 1.005–1.030)
pH: 5 (ref 5.0–8.0)

## 2024-05-17 LAB — APTT: aPTT: 29 s (ref 24–36)

## 2024-05-17 MED ORDER — ACETAMINOPHEN 650 MG RE SUPP: 650.0000 mg | Freq: Four times a day (QID) | RECTAL | Status: AC | PRN

## 2024-05-17 MED ORDER — ARIPIPRAZOLE 2 MG PO TABS
2.0000 mg | ORAL_TABLET | Freq: Every day | ORAL | Status: AC
  Administered 2024-05-17 – 2024-05-24 (×8): 2 mg via ORAL
  Filled 2024-05-17 (×8): qty 1

## 2024-05-17 MED ORDER — ACETAMINOPHEN 325 MG PO TABS: 650.0000 mg | ORAL_TABLET | Freq: Four times a day (QID) | ORAL | Status: DC | PRN

## 2024-05-17 MED ORDER — INSULIN ASPART 100 UNIT/ML IJ SOLN
0.0000 [IU] | Freq: Three times a day (TID) | INTRAMUSCULAR | Status: DC
  Administered 2024-05-17: 2 [IU] via SUBCUTANEOUS
  Administered 2024-05-18: 1 [IU] via SUBCUTANEOUS
  Administered 2024-05-20: 2 [IU] via SUBCUTANEOUS
  Administered 2024-05-20 – 2024-05-22 (×3): 3 [IU] via SUBCUTANEOUS
  Administered 2024-05-22 (×2): 1 [IU] via SUBCUTANEOUS
  Administered 2024-05-23: 2 [IU] via SUBCUTANEOUS
  Filled 2024-05-17 (×2): qty 2
  Filled 2024-05-17: qty 1
  Filled 2024-05-17: qty 2
  Filled 2024-05-17: qty 3
  Filled 2024-05-17 (×2): qty 1
  Filled 2024-05-17 (×3): qty 2
  Filled 2024-05-17: qty 3
  Filled 2024-05-17: qty 2

## 2024-05-17 MED ORDER — HYDRALAZINE HCL 20 MG/ML IJ SOLN
10.0000 mg | Freq: Four times a day (QID) | INTRAMUSCULAR | Status: AC | PRN
  Filled 2024-05-17: qty 1

## 2024-05-17 MED ORDER — MORPHINE SULFATE (PF) 2 MG/ML IV SOLN: 2.0000 mg | INTRAVENOUS | Status: DC | PRN

## 2024-05-17 MED ORDER — SODIUM CHLORIDE 0.9 % IV SOLN: INTRAVENOUS | Status: AC

## 2024-05-17 MED ORDER — POLYETHYLENE GLYCOL 3350 17 G PO PACK
17.0000 g | PACK | Freq: Every day | ORAL | Status: DC
  Administered 2024-05-17: 17 g via ORAL
  Filled 2024-05-17: qty 1

## 2024-05-17 MED ORDER — ALBUTEROL SULFATE (2.5 MG/3ML) 0.083% IN NEBU: 2.5000 mg | INHALATION_SOLUTION | RESPIRATORY_TRACT | Status: AC | PRN

## 2024-05-17 MED ORDER — INSULIN GLARGINE-YFGN 100 UNIT/ML ~~LOC~~ SOLN: 20.0000 [IU] | Freq: Every day | SUBCUTANEOUS | Status: DC

## 2024-05-17 MED ORDER — SENNA 8.6 MG PO TABS
2.0000 | ORAL_TABLET | Freq: Every day | ORAL | Status: DC
  Administered 2024-05-17 – 2024-05-22 (×5): 17.2 mg via ORAL
  Filled 2024-05-17 (×6): qty 2

## 2024-05-17 MED ORDER — ACETAMINOPHEN 325 MG PO TABS: 650.0000 mg | ORAL_TABLET | Freq: Four times a day (QID) | ORAL | Status: AC | PRN

## 2024-05-17 MED ORDER — ROSUVASTATIN CALCIUM 5 MG PO TABS
10.0000 mg | ORAL_TABLET | Freq: Every day | ORAL | Status: DC
  Administered 2024-05-17 – 2024-05-22 (×6): 10 mg via ORAL
  Filled 2024-05-17 (×6): qty 2

## 2024-05-17 MED ORDER — FLUOXETINE HCL 20 MG PO CAPS
20.0000 mg | ORAL_CAPSULE | Freq: Two times a day (BID) | ORAL | Status: AC | PRN
  Administered 2024-05-20: 20 mg via ORAL
  Filled 2024-05-17: qty 1

## 2024-05-17 MED ORDER — ENOXAPARIN SODIUM 40 MG/0.4ML IJ SOSY
40.0000 mg | PREFILLED_SYRINGE | INTRAMUSCULAR | Status: AC
  Administered 2024-05-17 – 2024-05-19 (×3): 40 mg via SUBCUTANEOUS
  Filled 2024-05-17 (×3): qty 0.4

## 2024-05-17 MED ORDER — CEFAZOLIN SODIUM-DEXTROSE 2-4 GM/100ML-% IV SOLN
2.0000 g | INTRAVENOUS | Status: AC
  Administered 2024-05-21: 2 g via INTRAVENOUS
  Filled 2024-05-17: qty 100

## 2024-05-17 MED ORDER — PANTOPRAZOLE SODIUM 40 MG PO TBEC
40.0000 mg | DELAYED_RELEASE_TABLET | Freq: Every day | ORAL | Status: AC
  Administered 2024-05-17 – 2024-05-24 (×8): 40 mg via ORAL
  Filled 2024-05-17 (×8): qty 1

## 2024-05-17 NOTE — Consult Note (Signed)
 Neurosurgery Consult Note  Assessment:  64 year old male history of DM2 A1c 5.4, PE on Xarelto , remote cellulitis with sepsis who presents with 3 weeks of progressive cervical myelopathy due to C4-C6 stenosis and OPLL  Plan:  Posterior cervical decompression and fusion on Tuesday Hold home Xarelto .  Okay for therapeutic Lovenox  or heparin  Activity as tolerated.  No collar needed Diet as tolerated Monitor Plt 141 (was 66 3 days ago)   CC: Generalized weakness  HPI:     Patient is a 64 y.o. male w/ hx of diabetes A1c 5.4, PE on Xarelto , remote cellulitis with sepsis who presents with 3 weeks of subjective quadriparesis, gait imbalance, hand weakness, hand paresthesias.  He has fallen multiple times in the past few days due to gait imbalance and weakness.  He was seen in the emergency department 3 days ago after a mechanical fall and was sent home after no acute injuries were discovered.  He presented yesterday after another fall and was found down for an unknown length of time.  The patient is very slow to respond with questioning, but when he does answer it is coherent  CT cervical spine shows autofusion of C3-4 and C4-6 OPLL MRI cervical spine shows severe central stenosis at C4-5 due to OPLL and ligamentous hypertrophy.  There is moderate central stenosis at C5-6.  There is moderate degenerative disc disease at C6-7 without significant stenosis  Patient Active Problem List   Diagnosis Date Noted   Spinal stenosis 05/17/2024   Cervical stenosis of spine 05/17/2024   Long term current use of anticoagulant therapy 09/18/2023   Major depressive disorder, recurrent severe without psychotic features (HCC) 09/18/2023   Mild cognitive impairment of uncertain or unknown etiology 09/18/2023   Acute hypoxemic respiratory failure (HCC) 09/18/2023   Aspiration pneumonitis (HCC) 08/30/2023   Aspiration into airway 08/24/2023   SIRS (systemic inflammatory response syndrome) (HCC) 08/24/2023    Oropharyngeal dysphagia 08/24/2023   Acute hypoxic respiratory failure (HCC) 08/24/2023   Pneumonia 09/06/2022   Hyperlipidemia 09/06/2022   Generalized anxiety disorder 09/06/2022   Major depressive disorder 09/06/2022   Type 2 diabetes mellitus with diabetic neuropathy, unspecified (HCC) 09/06/2022   History of retinal detachment 09/06/2022   Sepsis due to Staphylococcus aureus (HCC) 03/01/2016   Cellulitis of left lower extremity    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   Cellulitis and abscess 12/24/2015   Balanitis 12/24/2015   Uncontrolled type 2 diabetes mellitus with hyperglycemia, with long-term current use of insulin  (HCC)    Bacteremia    Morbid obesity due to excess calories (HCC)    Cellulitis of abdominal wall 11/11/2015   Sepsis (HCC) 11/11/2015   Rash 11/11/2015   Hypertension    Diabetes mellitus without complication (HCC)    Candida-induced panniculitis    GERD (gastroesophageal reflux disease) 04/19/2015   Pulmonary embolism (HCC) 03/21/2013   Non-alcoholic fatty liver disease 04/19/2011   Past Medical History:  Diagnosis Date   Balanitis 12/24/2015   Cellulitis and abscess 12/24/2015   Generalized anxiety disorder 09/06/2022   Hyperlipidemia 09/06/2022   Hypertension    IBS (irritable bowel syndrome)    Major depressive disorder 09/06/2022   Mild cognitive disorder 09/06/2022   Dec 07, 2016 Entered By: WELBY AURELIANO HERO Comment: Medical Disabilty 12.2017 -Jul 10, 2018 Entered By: ONETHA PARTICIA SAILOR Comment: diagnosed at [facility]   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   Non-alcoholic fatty liver disease 04/19/2011   Dec 07, 2016 Entered By: WELBY AURELIANO HERO Comment: Urged to modify lifestyle &amp; Urged again 2018 to  begin Coffee 2-4/d for hepatoprotection   Pneumonia 2014-2015 X 1   Pulmonary embolism (HCC) 03/21/2013   Sepsis (HCC) 11/11/2015   Type II diabetes mellitus (HCC)     Past Surgical History:  Procedure Laterality Date   TEE WITHOUT CARDIOVERSION N/A 11/17/2015    Procedure: TRANSESOPHAGEAL ECHOCARDIOGRAM (TEE);  Surgeon: Annabella Scarce, MD;  Location: [facility] ENDOSCOPY;  Service: Cardiovascular;  Laterality: N/A;   TONSILLECTOMY  ~ 1977    (Not in a hospital admission)  Allergies[1]  Social History   Tobacco Use   Smoking status: Never   Smokeless tobacco: Never  Substance Use Topics   Alcohol use: No    Family History  Problem Relation Age of Onset   Myasthenia gravis Mother    Diabetes type II Brother      Review of Systems Pertinent items are noted in HPI.  Objective:   Patient Vitals for the past 8 hrs:  BP Temp Temp src Pulse Resp SpO2  05/17/24 1230 (!) 149/73 98.7 F (37.1 C) Oral (!) 111 16 99 %  05/17/24 1045 137/77 98.4 F (36.9 C) Oral 97 15 95 %  05/17/24 0930 (!) 141/79 -- -- 93 15 98 %  05/17/24 0648 -- 97.8 F (36.6 C) Oral -- -- --  05/17/24 0615 133/78 -- -- 94 15 98 %  05/17/24 0530 121/67 -- -- 99 17 95 %  05/17/24 0515 133/74 -- -- 97 15 96 %   No intake/output data recorded. No intake/output data recorded.    Exam: GCS 4E 4V 25M  Oriented and coherent, but very slow/delayed to answer questions No aphasia or dysarthria PERRL EOMI, conjugate gaze FS TM Full strength BUE and BLE  Subjective paresthesias b/l hands palmar and dorsal side     Data ReviewCBC:  Lab Results  Component Value Date   WBC 13.1 (H) 05/17/2024   RBC 4.49 05/17/2024   BMP:  Lab Results  Component Value Date   GLUCOSE 118 (H) 05/17/2024   CO2 26 05/17/2024   BUN 17 05/17/2024   CREATININE 0.58 (L) 05/17/2024   CALCIUM  9.2 05/17/2024         [1]  Allergies Allergen Reactions   Empagliflozin  Other (See Comments)    Acidosis

## 2024-05-17 NOTE — ED Provider Notes (Signed)
 I assumed care at signout to call report Patient had increasing generalized weakness and falls.  Extensive workup has revealed cervical myelopathy. Per neurosurgery notes, patient should be admitted to the medical service for medical optimization and likely posterior decompression/fusion next week On my exam patient is disheveled but in no acute distress.  He appears globally weak but is able to move all 4 extremities. There is no deformities to his extremities He has no abdominal tenderness  Discussed with Dr. Debby with [facility] hospitalist for admission   Midge Golas, MD 05/17/24 [phone]

## 2024-05-17 NOTE — H&P (Signed)
 " History and Physical    Bradley Mcclure FMW:[id number] DOB: 08/17/1960 DOA: 05/16/2024  PCP: [facility]  Patient coming from: home  I have personally briefly reviewed patient's old medical records in [facility]  Chief Complaint: fall  HPI: Bradley Mcclure is a 64 y.o. male with medical history significant of HLD, HTN , IBS, Depression, Mild Cognitive D/o  NAFLD, Hx of PE, DMII, who presents to ED for the second time in 3 days with  fall and debility.  Initially on 1/27 patient had mechanical fall on ice with head strike s/p which on evaluation he was noted to be slow to respond but was alert and oriented.  He had full evaluation and noted to have no fractures. He was seen by physical therapy and cleared for discharge home.  Patient now returns 3 days later [facility] with s/p fall after being found down at home for unknown length of time. Patient states he has not had anything to eat of drink for 2 days. He notes no LOC of head trauma with fall. Patient continued to complain of neck and back pain, noted no improvement since last evaluation. Patient notes he feels generally weak. He notes no fever/chills/ n/v/d/  but states he has had mild cough.    ED Course:  IN ED on evaluation  Patient found to have stable vitals.  Labs notable for  Wbc 18, PLT 160, NS:1L RVP neg Total CK: 1425 NA 135, K 3.8, Cl 99, Glu 129, cr 0.52 AST 50  MRI:  IMPRESSION: 1. No evidence of an acute intracranial abnormality on this motion-degraded examination. 2. Age-advanced chronic small vessel ischemia in the cerebral white matter and pons. 3. Cerebral Atrophy (ICD10-G31.9). CTAB  IMPRESSION: 1. No acute findings in the abdomen or pelvis. 2. Moderate colonic stool burden. MRI DEGENERATIVE CHANGES: Mild diffuse thoracic spondylosis and moderate facet arthrosis. No compressive spinal canal or neural foraminal stenosis.   IMPRESSION: 1. No evidence of infection or compressive stenosis  in the thoracic spine.  MRI cervical spine Given the additional history of a recent fall, the prevertebral swelling could also be traumatic with mild STIR hyperintensity in the C4-5 disc potentially reflecting an element of traumatic disc and ligamentous injury.  ZXH:dpwld , PVC,  nonspecific twave abnormalities TX: Lorazepam  1mg  , zofram 4mg  ,morphine  4mg    Review of Systems: As per HPI otherwise 10 point review of systems negative.   Past Medical History:  Diagnosis Date   Balanitis 12/24/2015   Cellulitis and abscess 12/24/2015   Generalized anxiety disorder 09/06/2022   Hyperlipidemia 09/06/2022   Hypertension    IBS (irritable bowel syndrome)    Major depressive disorder 09/06/2022   Mild cognitive disorder 09/06/2022   Dec 07, 2016 Entered By: WELBY AURELIANO CHRISTELLA Comment: Medical Disabilty 12.2017 -Jul 10, 2018 Entered By: ONETHA PARTICIA SAILOR Comment: diagnosed at [place] [facility]   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   Non-alcoholic fatty liver disease 04/19/2011   Dec 07, 2016 Entered By: WELBY AURELIANO CHRISTELLA Comment: Urged to modify lifestyle &amp; Urged again 2018 to begin Coffee 2-4/d for hepatoprotection   Pneumonia 2014-2015 X 1   Pulmonary embolism (HCC) 03/21/2013   Sepsis (HCC) 11/11/2015   Type II diabetes mellitus (HCC)     Past Surgical History:  Procedure Laterality Date   TEE WITHOUT CARDIOVERSION N/A 11/17/2015   Procedure: TRANSESOPHAGEAL ECHOCARDIOGRAM (TEE);  Surgeon: Annabella Scarce, MD;  Location: [facility] ENDOSCOPY;  Service: Cardiovascular;  Laterality: N/A;   TONSILLECTOMY  ~ 1977  reports that he has never smoked. He has never used smokeless tobacco. He reports that he does not drink alcohol and does not use drugs.  Allergies[1]  Family History  Problem Relation Age of Onset   Myasthenia gravis Mother    Diabetes type II Brother     Prior to Admission medications  Medication Sig Start Date End Date Taking? Authorizing Provider  acetaminophen  (TYLENOL ) 325 MG  tablet Take 650 mg by mouth every 6 (six) hours as needed for mild pain.     [provider]  ARIPiprazole  (ABILIFY ) 2 MG tablet Take 2 mg by mouth daily. 05/11/22   [provider]  cyanocobalamin  (VITAMIN B12) 500 MCG tablet Take 500 mcg by mouth daily. 08/11/23   [provider]  FLUoxetine  (PROZAC ) 20 MG capsule Take 20 mg by mouth 2 (two) times daily as needed (anxiety/depression). 02/02/22   [provider]  furosemide  (LASIX ) 40 MG tablet Take 1 tablet (40 mg total) by mouth 2 (two) times daily. 09/22/23 01/19/24  Singh, Prashant K, MD  insulin  aspart (NOVOLOG ) 100 UNIT/ML FlexPen Before each meal 3 times a day, 140-199 - 2 units, 200-250 - 4 units, 251-299 - 6 units,  300-349 - 8 units,  350 or above 10 units. 09/22/23   Singh, Prashant K, MD  insulin  glargine (LANTUS ) 100 UNIT/ML Solostar Pen Inject 30 Units into the skin daily. 09/22/23   Singh, Prashant K, MD  metFORMIN  (GLUCOPHAGE ) 500 MG tablet Take 1,000 mg by mouth 2 (two) times daily with a meal.     [provider]  pantoprazole  (PROTONIX ) 40 MG tablet Take 1 tablet (40 mg total) by mouth daily. 09/14/23 01/19/24  Christobal Guadalajara, MD  polyethylene glycol (MIRALAX  / GLYCOLAX ) packet Take 17 g by mouth daily. 03/02/16   Vicci Cornet, MD  potassium chloride  SA (KLOR-CON  M) 20 MEQ tablet Take 1 tablet (20 mEq total) by mouth daily. 09/22/23   Singh, Prashant K, MD  rivaroxaban  (XARELTO ) 20 MG TABS tablet Take 20 mg by mouth daily with supper.    [provider]  rosuvastatin  (CRESTOR ) 20 MG tablet Take 10 mg by mouth daily. 08/23/23   [provider]  Semaglutide, 2 MG/DOSE, 8 MG/3ML SOPN Inject 2 mg into the skin every 7 (seven) days. 08/29/22   [provider]    Physical Exam: Vitals:   05/16/24 1945 05/16/24 2145 05/16/24 2313 05/17/24 0045  BP: 115/67 120/64  115/76  Pulse: (!) 102 98  (!) 101  Resp: 18 16  14   Temp:   97.9 F (36.6 C)   TempSrc:   Axillary   SpO2: 96% 98%   95%  Weight:      Height:        Constitutional: NAD, calm, comfortable Vitals:   05/16/24 1945 05/16/24 2145 05/16/24 2313 05/17/24 0045  BP: 115/67 120/64  115/76  Pulse: (!) 102 98  (!) 101  Resp: 18 16  14   Temp:   97.9 F (36.6 C)   TempSrc:   Axillary   SpO2: 96% 98%  95%  Weight:      Height:       Eyes: PERRL, lids and conjunctivae normal ENMT: Mucous membranes are dry Neck: normal, supple, no masses, no thyromegaly Respiratory: clear to auscultation bilaterally, no wheezing, no crackles. Normal respiratory effort. No accessory muscle use.  Cardiovascular: Regular rate and rhythm, no murmurs / rubs / gallops. No extremity edema. 2+ pedal pulses. Abdomen: no tenderness, no masses palpated. No  hepatosplenomegaly. Bowel sounds positive.  Musculoskeletal: no clubbing / cyanosis. No joint deformity upper and lower extremities. Good ROM, no contractures. Normal muscle tone.  Skin: no rashes, lesions, ulcers. No induration Neurologic: CN 2-12 grossly intact. Sensation intact,. Strength 4/5 in all 4. Weak b/l grip Psychiatric: Normal judgment and insight. Alert and oriented x 3. Normal mood.    Labs on Admission: I have personally reviewed following labs and imaging studies  CBC: Recent Labs  Lab 05/14/24 1200 05/14/24 1213 05/16/24 0920  WBC 6.9  --  18.4*  NEUTROABS 5.0  --  16.1*  HGB 15.9 15.6 15.3  HCT 46.4 46.0 42.4  MCV 90.4  --  87.4  PLT 66*  --  160   Basic Metabolic Panel: Recent Labs  Lab 05/14/24 1213 05/14/24 1458 05/16/24 1213  NA 133* 134* 135  K 8.4* 4.3 3.8  CL 101 100 99  CO2  --  21* 22  GLUCOSE 89 96 129*  BUN 7* 7* 16  CREATININE 0.70 0.49* 0.52*  CALCIUM   --  8.7* 9.1   GFR: Estimated Creatinine Clearance: 109.6 mL/min (A) (by C-G formula based on SCr of 0.52 mg/dL (L)). Liver Function Tests: Recent Labs  Lab 05/14/24 1458 05/16/24 1213  AST 22 50*  ALT 17 20  ALKPHOS 55 55  BILITOT 0.7 0.9  PROT 6.4* 6.4*  ALBUMIN 3.8  3.8   Recent Labs  Lab 05/16/24 1213  LIPASE 13   No results for input(s): AMMONIA in the last 168 hours. Coagulation Profile: Recent Labs  Lab 05/14/24 1458  INR 1.1   Cardiac Enzymes: Recent Labs  Lab 05/16/24 1213  CKTOTAL 1,425*   BNP (last 3 results) No results for input(s): PROBNP in the last 8760 hours. HbA1C: No results for input(s): HGBA1C in the last 72 hours. CBG: No results for input(s): GLUCAP in the last 168 hours. Lipid Profile: No results for input(s): CHOL, HDL, LDLCALC, TRIG, CHOLHDL, LDLDIRECT in the last 72 hours. Thyroid  Function Tests: No results for input(s): TSH, T4TOTAL, FREET4, T3FREE, THYROIDAB in the last 72 hours. Anemia Panel: No results for input(s): VITAMINB12, FOLATE, FERRITIN, TIBC, IRON, RETICCTPCT in the last 72 hours. Urine analysis:    Component Value Date/Time   COLORURINE YELLOW 05/17/2024 0114   APPEARANCEUR CLOUDY (A) 05/17/2024 0114   LABSPEC >1.060 (H) 05/17/2024 0114   PHURINE 5.0 05/17/2024 0114   GLUCOSEU 50 (A) 05/17/2024 0114   HGBUR NEGATIVE 05/17/2024 0114   BILIRUBINUR NEGATIVE 05/17/2024 0114   KETONESUR 20 (A) 05/17/2024 0114   PROTEINUR 30 (A) 05/17/2024 0114   NITRITE NEGATIVE 05/17/2024 0114   LEUKOCYTESUR NEGATIVE 05/17/2024 0114    Radiological Exams on Admission: See above  EKG: Independently reviewed. See above  Assessment/Plan   Spinal stenosis  Prevertebral swelling with mild STIR hyperintensity in the C4-5 disc with concern for traumatic disc and ligamentous injury -admit to progressive care  -per neurosurgery Dr Meryl, plan for decompression once patient has been optimized  -supportive care for pain  -continue on neuro checks q4h   Leukocytosis  -no obvious infection  -possible stress response, element of hemocentration - RVP negative , CXR NAD -will  f/u with UA  -CT abd noted other constipation no mention of colitis  -continue to monitor ,  patient afebrile , no need for abx   Constipation , IBS -start on bowel regimen   CHFpef -ef 60% -lasix  on hold as patient appears volume down  -resume as able    GERD -ppi  HLD -resume home regimen per med rec   HTN  -of metoprolol  as bp under control with solely lasix  per last cardiology note   Depression -stable    Mild Cognitive D/o  -no active issue curently   NAFLD -no active issue s -continue statin   Hx of PE -on xarelto  as outpatient  -on hold due to planned surgery  -will bridge with lovenox     DMII -iss/fs  -A1C8.9 -resume lantus       DVT prophylaxis: on xarelto  Code Status: full/ as discussed per patient wishes in event of cardiac arrest  Family Communication: none at bedside Disposition Plan: patient  expected to be admitted greater than 2 midnights  Consults called: Neurosurgery Dr Meryl Admission status: progressive care    Camila DELENA Ned MD [facility]   If 7PM-7AM, please contact night-coverage [email or web address] Password [email or web address]  05/17/2024, 2:18 AM        [1]  Allergies Allergen Reactions   Empagliflozin  Other (See Comments)    Acidosis   "

## 2024-05-17 NOTE — Progress Notes (Addendum)
 "  [facility] PROGRESS NOTE   Bradley Mcclure FMW:[id number] DOB: Feb 05, 1961 DOA: 05/16/2024  PCP: [facility]  Brief History: 64 y.o. male with medical history significant of HLD, HTN , IBS, Depression, Mild Cognitive D/o  NAFLD, Hx of PE, DMII, who presented to ED for the second time in 3 days with  fall and debility.  Initially on 1/27 patient had mechanical fall on ice with head strike s/p which on evaluation he was noted to be slow to respond but was alert and oriented.  He had full evaluation and noted to have no fractures. He was seen by physical therapy and cleared for discharge home.  Patient returned 3 days later [facility] with s/p fall after being found down at home for unknown length of time.  Patient mentioned neck pain.  Further evaluation raised concern for significant cervical spine stenosis with concern for cord compression.  Patient was hospitalized for further management.    Consultants: Neurosurgery  Procedures: None yet    Subjective/Interval History: Patient not very communicative but does not appear to be in any pain or discomfort.  He does mention neck pain but seemingly under control.  Denies any chest pain shortness of breath.  He mentions that he is on Xarelto  for history of blood clots in his lungs which was diagnosed about 2 years ago.    Assessment/Plan:  Cervical spine stenosis/frequent falls MRI of the cervical spine raises concern for significant cervical spine stenosis. Neurosurgery has been consulted.  They plan to perform decompression surgery during this hospital stay.  Plan is to do this early next week. Pain control.  Neurochecks.  PT OT eval. Xarelto  on hold.    Leukocytosis No obvious evidence for infection.  UA is unremarkable.  Chest x-ray did not show any pneumonia.  Spine imaging study did not show any evidence for infection either.  Elevated WBCs likely reactive.  He is afebrile.  Noted to be better this morning.  Lactic  acid level was normal.  Continue to monitor for now. He also underwent CT scan of the abdomen pelvis which did not show any infection either.  Constipation Significant constipation noted on CT scan.  Patient to be started on bowel regimen. TSH was normal in June 2025.  Will recheck.  History of pulmonary embolism Based on previous records it appears that he has been on rivaroxaban  at least since 2017.  Subsequent CT scans have not shown pulmonary embolism.  It should be safe to hold his anticoagulation for his surgery.  Chronic diastolic CHF Lasix  on hold for now. Last echocardiogram from May 2025 shows LVEF of 60 to 65%.  At that time diastolic parameters were normal.  Essential hypertension Being managed only with furosemide .  Metoprolol  was discontinued at last cardiology visit.  Nonalcoholic fatty liver disease Stable.  History of depression/mild cognitive impairment Stable.  Diabetes mellitus type 2 HbA1c 5.4.  It appears that Lantus  was discontinued by outpatient providers.  Will cut back on the dose of his insulin  here.  Monitor CBGs.   DVT Prophylaxis: Lovenox  Code Status: Full code Family Communication: Discussed with patient.  No family at bedside Disposition Plan: To be determined  Status is: Inpatient Remains inpatient appropriate because: Cervical spine stenosis requiring surgery      Medications: Scheduled:  ARIPiprazole   2 mg Oral Daily   insulin  aspart  0-9 Units Subcutaneous TID WC   insulin  glargine-yfgn  20 Units Subcutaneous Daily   pantoprazole   40 mg Oral Daily  polyethylene glycol  17 g Oral Daily   rosuvastatin   10 mg Oral Daily   senna  2 tablet Oral Daily   Continuous:  sodium chloride  100 mL/hr at 05/17/24 0406   PRN:acetaminophen  **OR** acetaminophen , albuterol , FLUoxetine , hydrALAZINE , morphine  injection  Objective:  Vital Signs  Vitals:   05/17/24 0515 05/17/24 0530 05/17/24 0615 05/17/24 0648  BP: 133/74 121/67 133/78    Pulse: 97 99 94   Resp: 15 17 15    Temp:    97.8 F (36.6 C)  TempSrc:    Oral  SpO2: 96% 95% 98%   Weight:      Height:       No intake or output data in the 24 hours ending 05/17/24 0821 Filed Weights   05/16/24 0914  Weight: 94 kg    General appearance: Awake alert.  In no distress Resp: Clear to auscultation bilaterally.  Normal effort Cardio: S1-S2 is normal regular.  No S3-S4.  No rubs murmurs or bruit GI: Abdomen is soft.  Nontender nondistended.  Bowel sounds are present normal.  No masses organomegaly Extremities: No edema.  Moving his legs but significant deconditioning is noted Neurologic: No focal neurological deficits.    Lab Results:  Data Reviewed: I have personally reviewed following labs and reports of the imaging studies  CBC: Recent Labs  Lab 05/14/24 1200 05/14/24 1213 05/16/24 0920 05/17/24 0422  WBC 6.9  --  18.4* 13.1*  NEUTROABS 5.0  --  16.1* 9.8*  HGB 15.9 15.6 15.3 14.1  HCT 46.4 46.0 42.4 40.2  MCV 90.4  --  87.4 89.5  PLT 66*  --  160 141*    Basic Metabolic Panel: Recent Labs  Lab 05/14/24 1213 05/14/24 1458 05/16/24 1213 05/17/24 0422  NA 133* 134* 135 136  K 8.4* 4.3 3.8 4.1  CL 101 100 99 99  CO2  --  21* 22 26  GLUCOSE 89 96 129* 118*  BUN 7* 7* 16 17  CREATININE 0.70 0.49* 0.52* 0.58*  CALCIUM   --  8.7* 9.1 9.2    GFR: Estimated Creatinine Clearance: 109.6 mL/min (A) (by C-G formula based on SCr of 0.58 mg/dL (L)).  Liver Function Tests: Recent Labs  Lab 05/14/24 1458 05/16/24 1213 05/17/24 0422  AST 22 50* 45*  ALT 17 20 20   ALKPHOS 55 55 54  BILITOT 0.7 0.9 0.9  PROT 6.4* 6.4* 6.2*  ALBUMIN 3.8 3.8 3.7    Recent Labs  Lab 05/16/24 1213  LIPASE 13    Coagulation Profile: Recent Labs  Lab 05/14/24 1458  INR 1.1    Cardiac Enzymes: Recent Labs  Lab 05/16/24 1213  CKTOTAL 1,425*   HbA1C: Recent Labs    05/17/24 0422  HGBA1C 5.4    Recent Results (from the past 240 hours)  Resp  panel by RT-PCR (RSV, Flu A&B, Covid) Anterior Nasal Swab     Status: None   Collection Time: 05/16/24  9:38 PM   Specimen: Anterior Nasal Swab  Result Value Ref Range Status   SARS Coronavirus 2 by RT PCR NEGATIVE NEGATIVE Final   Influenza A by PCR NEGATIVE NEGATIVE Final   Influenza B by PCR NEGATIVE NEGATIVE Final    Comment: (NOTE) The Xpert Xpress SARS-CoV-2/FLU/RSV plus assay is intended as an aid in the diagnosis of influenza from Nasopharyngeal swab specimens and should not be used as a sole basis for treatment. Nasal washings and aspirates are unacceptable for Xpert Xpress SARS-CoV-2/FLU/RSV testing.  Fact Sheet for Patients: bloggercourse.com  Fact Sheet for Healthcare Providers: seriousbroker.it  This test is not yet approved or cleared by the [place]  FDA and has been authorized for detection and/or diagnosis of SARS-CoV-2 by FDA under an Emergency Use Authorization (EUA). This EUA will remain in effect (meaning this test can be used) for the duration of the COVID-19 declaration under Section 564(b)(1) of the Act, 21 U.S.C. section 360bbb-3(b)(1), unless the authorization is terminated or revoked.     Resp Syncytial Virus by PCR NEGATIVE NEGATIVE Final    Comment: (NOTE) Fact Sheet for Patients: bloggercourse.com  Fact Sheet for Healthcare Providers: seriousbroker.it  This test is not yet approved or cleared by the [place]  FDA and has been authorized for detection and/or diagnosis of SARS-CoV-2 by FDA under an Emergency Use Authorization (EUA). This EUA will remain in effect (meaning this test can be used) for the duration of the COVID-19 declaration under Section 564(b)(1) of the Act, 21 U.S.C. section 360bbb-3(b)(1), unless the authorization is terminated or revoked.  Performed at [facility], [address], [place], [place] [address]    Blood culture (routine x 2)     Status: None (Preliminary result)   Collection Time: 05/16/24  9:38 PM   Specimen: BLOOD RIGHT ARM  Result Value Ref Range Status   Specimen Description BLOOD RIGHT ARM  Final   Special Requests   Final    BOTTLES DRAWN AEROBIC ONLY Blood Culture adequate volume   Culture   Final    NO GROWTH < 12 HOURS Performed at [facility], [address] [place], [place] [address]    Report Status PENDING  Incomplete  Blood culture (routine x 2)     Status: None (Preliminary result)   Collection Time: 05/16/24  9:38 PM   Specimen: BLOOD LEFT ARM  Result Value Ref Range Status   Specimen Description BLOOD LEFT ARM  Final   Special Requests   Final    BOTTLES DRAWN AEROBIC AND ANAEROBIC Blood Culture adequate volume   Culture   Final    NO GROWTH < 12 HOURS Performed at [facility], [address] [place], [place] [address]    Report Status PENDING  Incomplete      Radiology Studies: CT ABDOMEN PELVIS W CONTRAST Result Date: 05/16/2024 EXAM: CT ABDOMEN AND PELVIS WITH CONTRAST 05/16/2024 11:19:59 PM TECHNIQUE: CT of the abdomen and pelvis was performed with the administration of 75 mL of iohexol  (OMNIPAQUE ) 350 MG/ML injection. Multiplanar reformatted images are provided for review. Automated exposure control, iterative reconstruction, and/or weight-based adjustment of the mA/kV was utilized to reduce the radiation dose to as low as reasonably achievable. COMPARISON: Pelvic radiograph 05/14/2024. CLINICAL HISTORY: Abdominal pain, acute, nonlocalized. Weakness, fall, sepsis. FINDINGS: LOWER CHEST: Emphysematous changes and scarring in the lung bases. LIVER: The liver is unremarkable. GALLBLADDER AND BILE DUCTS: Gallbladder is unremarkable. No biliary ductal dilatation. SPLEEN: No acute abnormality. PANCREAS: No acute abnormality. ADRENAL GLANDS: No acute abnormality. KIDNEYS, URETERS AND BLADDER: No stones in the kidneys or ureters. No hydronephrosis.  No perinephric or periureteral stranding. Urinary bladder is unremarkable. GI AND BOWEL: Stomach demonstrates no acute abnormality. The stomach, small bowel, and colon are not abnormally distended. There is stool throughout the colon with moderate stool burden. The small bowel is decompressed. Appendix is not identified. There is no bowel obstruction. PERITONEUM AND RETROPERITONEUM: No ascites. No free air. VASCULATURE: Aorta is normal in caliber. Calcification of the aorta. No aneurysm. LYMPH NODES: No lymphadenopathy. No  pelvic lymphadenopathy. REPRODUCTIVE ORGANS: The prostate gland is mildly enlarged. BONES AND SOFT TISSUES: Degenerative changes in the spine. No acute osseous abnormality. No focal soft tissue abnormality. IMPRESSION: 1. No acute findings in the abdomen or pelvis. 2. Moderate colonic stool burden. Electronically signed by: Elsie Gravely MD 05/16/2024 11:26 PM EST RP Workstation: [id number]   MR Lumbar Spine W Wo Contrast Result Date: 05/16/2024 EXAM: MRI LUMBAR SPINE 05/16/2024 06:03:28 PM TECHNIQUE: Multiplanar multisequence MRI of the lumbar spine was performed with and without the administration of intravenous contrast. COMPARISON: CT lumbar spine 05/14/2024. CLINICAL HISTORY: Low back pain, infection suspected, no prior imaging. FINDINGS: LIMITATIONS/ARTIFACTS: The examination is motion degraded, moderately so on the axial T1 postcontrast sequence and mildly elsewhere. BONES AND ALIGNMENT: 5 lumbar type vertebrae. Normal alignment. No fracture, suspicious marrow lesion, significant marrow edema, or evidence of discitis. Interbody ankylosis at L4-L5. SPINAL CORD: The conus medullaris terminates at T12-L1 and is normal in signal. SOFT TISSUES: No epidural or paraspinal collection. T12-L1: Mild facet and ligamentum flavum hypertrophy without disc herniation or stenosis. L1-L2: Mild facet and ligamentum flavum hypertrophy without disc herniation or stenosis. L2-L3: Mild facet hypertrophy  without disc herniation or stenosis. L3-L4: Minimal disc bulging and mild to moderate facet hypertrophy without stenosis. L4-L5: Disc bulging, endplate spurring, and mild to moderate facet hypertrophy result in mild bilateral neural foraminal stenosis without spinal stenosis. L5-S1: Disc bulging, a left foraminal disc osteophyte complex, and mild facet hypertrophy result in mild right and moderate left neural foraminal stenosis without spinal stenosis. IMPRESSION: 1. No evidence of lumbar spine infection. 2. Lumbar spondylosis and facet hypertrophy with moderate left neural foraminal stenosis at L5-S1. No spinal stenosis. Electronically signed by: Dasie Hamburg MD 05/16/2024 07:15 PM EST RP Workstation: [id number]   MR Cervical Spine W and Wo Contrast Addendum Date: 05/16/2024 ADDENDUM #1 *ADDENDUM: Given the additional history of a recent fall, the prevertebral swelling could also be traumatic with mild STIR hyperintensity in the C4-5 disc potentially reflecting an element of traumatic disc and ligamentous injury. ---------------------------------------------------- Electronically signed by: Dasie Hamburg MD 05/16/2024 07:09 PM EST RP Workstation: [id number]   Result Date: 05/16/2024 * ORIGINAL REPORT ** EXAM: MRI CERVICAL SPINE WITH AND WITHOUT CONTRAST 05/16/2024 06:03:28 PM TECHNIQUE: Multiplanar multisequence MRI of the cervical spine was performed without and with the administration of intravenous contrast. COMPARISON: CT cervical spine 05/14/2024. CLINICAL HISTORY: Cervical radiculopathy, infection suspected, no prior imaging. FINDINGS: LIMITATIONS/ARTIFACTS: Mildly to moderately motion degraded. BONES AND ALIGNMENT: Straightening of the normal cervical lordosis. No significant listhesis. No fracture, suspicious marrow lesion, significant marrow edema, or evidence of discitis. SPINAL CORD: Normal spinal cord signal. SOFT TISSUES: Thickening/edema and enhancement of the upper cervical prevertebral soft  tissues measuring up to 7 mm in thickness at C3-C4 and new from the recent prior CT. No epidural collection. C2-C3: Mild disc bulging and facet arthrosis without significant stenosis. C3-C4: Incomplete segmentation. No stenosis. 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 C5-C6: A broad based posterior disc osteophyte complex including more focal right paracentral component results in moderate right sided spinal stenosis with mild cord flattening and moderate to severe right and moderate left neural foraminal stenosis. C6-C7: A broad based posterior disc osteophyte complex eccentric to the left results in mild spinal stenosis and mild right and mild to moderate left neural foraminal stenosis. C7-T1: Negative. IMPRESSION: 1. New mild  inflammation in the upper and mid cervical prevertebral soft tissues, of indeterminate etiology. No evidence of an organized abscess or discitis/osteomyelitis. 2. Severe spinal stenosis with severe cord flattening and moderate bilateral neural foraminal stenosis at C4-5. 3. Moderate right-sided spinal stenosis with mild cord flattening and moderate to severe right and moderate left neural foraminal stenosis at C5-6. Electronically signed by: Dasie Hamburg MD 05/16/2024 06:59 PM EST RP Workstation: [id number]   MR THORACIC SPINE W WO CONTRAST Result Date: 05/16/2024 EXAM: MRI THORACIC SPINE WITH AND WITHOUT INTRAVENOUS CONTRAST 05/16/2024 06:03:28 PM TECHNIQUE: Multiplanar multisequence MRI of the thoracic spine was performed with and without the administration of 9.5 mL gadobutrol  (GADAVIST  1 MMOL/ML injection 9.5 mL GADOBUTROL  1 MMOL/ML IV SOLN) intravenous contrast. COMPARISON: None available. CLINICAL HISTORY: Mid-back pain, infection suspected, no prior imaging. FINDINGS: LIMITATIONS:  The examination is motion degraded, moderately so on the axial T1 postcontrast sequence and mildly on other sequences. BONES AND ALIGNMENT: Mildly exaggerated thoracic kyphosis. No significant listhesis. Hemangiomas in the T7 and T9 vertebral bodies. No fracture, suspicious marrow lesion, significant marrow edema, or evidence of discitis. Scattered small mid and lower thoracic Schmorl nodes. SPINAL CORD: Normal spinal cord volume. Normal spinal cord signal. SOFT TISSUES: No epidural or paraspinal collection. DEGENERATIVE CHANGES: Mild diffuse thoracic spondylosis and moderate facet arthrosis. No compressive spinal canal or neural foraminal stenosis. IMPRESSION: 1. No evidence of infection or compressive stenosis in the thoracic spine. Electronically signed by: Dasie Hamburg MD 05/16/2024 07:03 PM EST RP Workstation: [id number]   MR BRAIN WO CONTRAST Result Date: 05/16/2024 EXAM: MRI BRAIN WITHOUT CONTRAST 05/16/2024 06:03:28 PM TECHNIQUE: Multiplanar multisequence MRI of the head/brain was performed without the administration of intravenous contrast. COMPARISON: Head CT 05/16/2024 and MRI 09/15/2023. CLINICAL HISTORY: Neuro deficit, acute, stroke suspected. Acute neurologic deficit; stroke suspected. FINDINGS: The examination is mildly to moderately motion degraded. BRAIN AND VENTRICLES: There is no evidence of an acute infarct, intracranial hemorrhage, mass, midline shift, hydrocephalus, or extra-axial fluid collection. Patchy T2 hyperintensities in the cerebral white matter bilaterally have mildly progressed from the prior MRI and are nonspecific but compatible with moderate chronic small vessel ischemic disease. There is T2 shine through on diffusion weighted imaging associated with a new small focus of gliosis in the left frontal white matter. Confluent T2 hyperintensity in the pons is unchanged. There is mild cerebral atrophy. 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  Major intracranial vascular flow voids are preserved. ORBITS: Right cataract extraction and scleral buckle. SINUSES AND MASTOIDS: Mild mucosal thickening in the right sphenoid sinus. Clear mastoid air cells. BONES AND SOFT TISSUES: Normal marrow signal. No soft tissue abnormality. IMPRESSION: 1. No evidence of an acute intracranial abnormality on this motion-degraded examination. 2. Age-advanced chronic small vessel ischemia in the cerebral white matter and pons. 3. Cerebral Atrophy (ICD10-G31.9). Electronically signed by: Dasie Hamburg MD 05/16/2024 06:47 PM EST RP Workstation: [id number]   CT Head Wo Contrast Result Date: 05/16/2024 EXAM: CT HEAD WITHOUT CONTRAST 05/16/2024 10:43:56 AM TECHNIQUE: CT of the head was performed without the administration of intravenous contrast. Automated exposure control, iterative reconstruction, and/or weight based adjustment of the mA/kV was utilized to reduce the radiation dose to as low as reasonably achievable. COMPARISON: 05/14/2024 CLINICAL HISTORY: Head trauma and abnormal mental status (Age 72-64y). FINDINGS: BRAIN AND VENTRICLES: No acute hemorrhage. No evidence of acute infarct. No hydrocephalus. No extra-axial collection. No mass effect or midline shift. Nonspecific periventricular and subcortical white matter hypoattenuation, consistent with chronic  microvascular ischemic changes. No lesion is evident within the right thalamus. Previously described focal hypodensity likely represents volume averaging with adjacent choroid plexus. Calcified atherosclerotic plaque within cavernous/supraclinoid internal carotid arteries. ORBITS: Right lens replacement and scleral band. SINUSES: Mucosal thickening in right sphenoid sinus. SOFT TISSUES AND SKULL: No acute soft tissue abnormality. No skull fracture. IMPRESSION: 1. No acute intracranial abnormality. No right thalamic lesion; previously described focal hypodensity likely represents volume averaging with adjacent choroid  plexus. 2. Nonspecific periventricular and subcortical white matter hypoattenuation, consistent with chronic microvascular ischemic changes. 3. Mucosal thickening in right sphenoid sinus. Electronically signed by: Evalene Coho MD 05/16/2024 11:15 AM EST RP Workstation: [id number]   DG Chest Port 1 View Result Date: 05/16/2024 EXAM: 1 VIEW(S) XRAY OF THE CHEST 05/16/2024 09:43:00 AM COMPARISON: 05/14/2024 CLINICAL HISTORY: Fatigue. FINDINGS: LUNGS AND PLEURA: No airspace consolidation. Stable chronic peripheral basilar reticular interstitial opacities. No pleural effusion. No pneumothorax. HEART AND MEDIASTINUM: No acute abnormality of the cardiac and mediastinal silhouettes. BONES AND SOFT TISSUES: No acute osseous abnormality. IMPRESSION: 1. No acute cardiopulmonary abnormality. Electronically signed by: Waddell Calk MD 05/16/2024 10:47 AM EST RP Workstation: [id number]       LOS: 0 days   Joette Pebbles  [facility] Pager on [email or web address]  05/17/2024, 8:21 AM   "

## 2024-05-17 NOTE — ED Notes (Signed)
 NT assisted pt with using urinal

## 2024-05-18 LAB — CBC
HCT: 38.2 % — ABNORMAL LOW (ref 39.0–52.0)
Hemoglobin: 12.9 g/dL — ABNORMAL LOW (ref 13.0–17.0)
MCH: 31.2 pg (ref 26.0–34.0)
MCHC: 33.8 g/dL (ref 30.0–36.0)
MCV: 92.5 fL (ref 80.0–100.0)
Platelets: 121 10*3/uL — ABNORMAL LOW (ref 150–400)
RBC: 4.13 MIL/uL — ABNORMAL LOW (ref 4.22–5.81)
RDW: 14 % (ref 11.5–15.5)
WBC: 8 10*3/uL (ref 4.0–10.5)
nRBC: 0 % (ref 0.0–0.2)

## 2024-05-18 LAB — GLUCOSE, CAPILLARY
Glucose-Capillary: 106 mg/dL — ABNORMAL HIGH (ref 70–99)
Glucose-Capillary: 92 mg/dL (ref 70–99)
Glucose-Capillary: 96 mg/dL (ref 70–99)
Glucose-Capillary: 128 mg/dL — ABNORMAL HIGH (ref 70–99)
Glucose-Capillary: 155 mg/dL — ABNORMAL HIGH (ref 70–99)

## 2024-05-18 LAB — BASIC METABOLIC PANEL WITH GFR
Anion gap: 9 (ref 5–15)
BUN: 17 mg/dL (ref 8–23)
CO2: 29 mmol/L (ref 22–32)
Calcium: 8.6 mg/dL — ABNORMAL LOW (ref 8.9–10.3)
Chloride: 103 mmol/L (ref 98–111)
Creatinine, Ser: 0.46 mg/dL — ABNORMAL LOW (ref 0.61–1.24)
GFR, Estimated: >60 mL/min
Glucose, Bld: 96 mg/dL (ref 70–99)
Potassium: 3.6 mmol/L (ref 3.5–5.1)
Sodium: 141 mmol/L (ref 135–145)

## 2024-05-18 LAB — TSH: TSH: 1.04 u[IU]/mL (ref 0.350–4.500)

## 2024-05-18 LAB — MAGNESIUM: Magnesium: 1.7 mg/dL (ref 1.7–2.4)

## 2024-05-18 LAB — PHOSPHORUS: Phosphorus: 2.8 mg/dL (ref 2.5–4.6)

## 2024-05-18 MED ORDER — POLYETHYLENE GLYCOL 3350 17 G PO PACK
17.0000 g | PACK | Freq: Two times a day (BID) | ORAL | Status: AC
  Administered 2024-05-18 – 2024-05-24 (×10): 17 g via ORAL
  Filled 2024-05-18 (×12): qty 1

## 2024-05-18 MED ORDER — BISACODYL 10 MG RE SUPP
10.0000 mg | Freq: Every day | RECTAL | Status: AC | PRN
  Filled 2024-05-18: qty 1

## 2024-05-18 MED ORDER — MAGNESIUM SULFATE 2 GM/50ML IV SOLN
2.0000 g | Freq: Once | INTRAVENOUS | Status: AC
  Administered 2024-05-18: 2 g via INTRAVENOUS
  Filled 2024-05-18: qty 50

## 2024-05-18 NOTE — Progress Notes (Addendum)
 "                        PROGRESS NOTE        PATIENT DETAILS Name: Bradley Mcclure Age: 64 y.o. Sex: male Date of Birth: October 16, 1960 Admit Date: 05/16/2024 Admitting Physician Joette Pebbles, MD ERE:Ropwpr, Bonni Lien  Brief Summary: Patient is a 64 y.o.  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  Evaluated by neurosurgery-recommendations were to hold anticoagulation and proceed with cervical decompression on 2/3.  Significant events: 1/30>> admit to [facility].  Significant studies: 1/29>> MRI brain: No acute intracranial abnormality 1/29> MRI C-spine: New inflammation in the upper/mid cervical prevertebral soft tissue-severe spinal stenosis with cord flattening at C4-C6, moderate right-sided spinal stenosis with mild cord flattening C5-C6. 1/29>> MRI T-spine: No evidence of infection or compressive stenosis 1/29>> MRI L-spine: No evidence of lumbar spine infection-no spinal stenosis. 1/29>> CT abdomen/pelvis: No acute findings.  Significant microbiology data: 1/29>> COVID/influenza/RSV PCR: Negative 1/29>> blood culture: Negative  Procedures: None  Consults: Neurosurgery  Subjective: Sleeping comfortably-has generalized weakness but no other complaints.  Objective: Vitals: Blood pressure 130/71, pulse 94, temperature 98.5 F (36.9 C), temperature source Axillary, resp. rate 14, height 6' 1 (1.854 m), weight 94 kg, SpO2 98%.   Exam: Gen Exam:Alert awake-not in any distress HEENT:atraumatic, normocephalic Chest: B/L clear to auscultation anteriorly CVS:S1S2 regular Abdomen:soft non tender, non distended Extremities:no edema Neurology: Non focal but has generalized weakness all over. 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 Remains essentially unchanged-has diffuse generalized weakness but nonfocal Neurosurgery planning decompressive surgery 2/3  Leukocytosis Likely reactive No evidence of infection Thankfully has resolved Continue to monitor off antibiotics  Constipation MiraLAX /senna Add Dulcolax suppository  History of PE Off rivaroxaban -on prophylactic Lovenox  prior to surgery  Chronic HFpEF Euvolemic  HTN BP stable Per prior note-metoprolol  discontinued by cardiology during his most recent outpatient visit.  HLD Statin  DM-2 (A1c 5.3 on 1/30) CBG stable SSI while inpatient Resume metformin  on discharge.  Recent Labs    05/17/24 2100 05/18/24 0440 05/18/24 0753  GLUCAP 84 106* 92     Mood disorder Seems stable Fluoxetine /Abilify    Code status:   Code Status: Full Code   DVT Prophylaxis: enoxaparin  (LOVENOX ) injection 40 mg Start: 05/17/24 1000    Family Communication: None at bedside   Disposition Plan: Status is: Inpatient Remains inpatient appropriate because: Severity of illness   Planned Discharge Destination:Skilled nursing facility   Diet: Diet Order             Diet NPO time specified  Diet effective  midnight           Diet heart healthy/carb modified Room service appropriate? Yes; Fluid consistency: Thin  Diet effective now                     Antimicrobial agents: Anti-infectives (From  admission, onward)    Start     Dose/Rate Route Frequency Ordered Stop   05/21/24 0600  ceFAZolin  (ANCEF ) IVPB 2g/100 mL premix        2 g 200 mL/hr over 30 Minutes Intravenous On call to O.R. 05/17/24 1736 05/22/24 0559        MEDICATIONS: Scheduled Meds:  ARIPiprazole   2 mg Oral Daily   enoxaparin  (LOVENOX ) injection  40 mg Subcutaneous Q24H   insulin  aspart  0-9 Units Subcutaneous TID WC   pantoprazole   40 mg Oral Daily   polyethylene glycol  17 g Oral Daily   rosuvastatin   10 mg Oral Daily   senna  2 tablet Oral Daily   Continuous Infusions:  [START ON 05/21/2024]  ceFAZolin  (ANCEF ) IV     PRN Meds:.acetaminophen  **OR** acetaminophen , albuterol , FLUoxetine , hydrALAZINE , morphine  injection   I have personally reviewed following labs and imaging studies  LABORATORY DATA: CBC: Recent Labs  Lab 05/14/24 1200 05/14/24 1213 05/16/24 0920 05/17/24 0422 05/18/24 0533  WBC 6.9  --  18.4* 13.1* 8.0  NEUTROABS 5.0  --  16.1* 9.8*  --   HGB 15.9 15.6 15.3 14.1 12.9*  HCT 46.4 46.0 42.4 40.2 38.2*  MCV 90.4  --  87.4 89.5 92.5  PLT 66*  --  160 141* 121*    Basic Metabolic Panel: Recent Labs  Lab 05/14/24 1213 05/14/24 1458 05/16/24 1213 05/17/24 0422 05/18/24 0533  NA 133* 134* 135 136 141  K 8.4* 4.3 3.8 4.1 3.6  CL 101 100 99 99 103  CO2  --  21* 22 26 29   GLUCOSE 89 96 129* 118* 96  BUN 7* 7* 16 17 17   CREATININE 0.70 0.49* 0.52* 0.58* 0.46*  CALCIUM   --  8.7* 9.1 9.2 8.6*  MG  --   --   --   --  1.7  PHOS  --   --   --   --  2.8    GFR: Estimated Creatinine Clearance: 109.6 mL/min (A) (by C-G formula based on SCr of 0.46 mg/dL (L)).  Liver Function Tests: Recent Labs  Lab 05/14/24 1458 05/16/24 1213 05/17/24 0422  AST 22 50* 45*  ALT 17 20 20   ALKPHOS 55 55 54  BILITOT 0.7 0.9 0.9  PROT 6.4* 6.4* 6.2*  ALBUMIN 3.8 3.8 3.7   Recent Labs  Lab 05/16/24 1213  LIPASE 13   No results for input(s): AMMONIA in the last 168  hours.  Coagulation Profile: Recent Labs  Lab 05/14/24 1458  INR 1.1    Cardiac Enzymes: Recent Labs  Lab 05/16/24 1213  CKTOTAL 1,425*    BNP (last 3 results) No results for input(s): PROBNP in the last 8760 hours.  Lipid Profile: No results for input(s): CHOL, HDL, LDLCALC, TRIG, CHOLHDL, LDLDIRECT in the last 72 hours.  Thyroid  Function Tests: Recent Labs    05/18/24 0533  TSH 1.040    Anemia Panel: No results for input(s): VITAMINB12, FOLATE, FERRITIN, TIBC, IRON, RETICCTPCT in the last 72 hours.  Urine analysis:    Component Value Date/Time   COLORURINE YELLOW 05/17/2024 0114   APPEARANCEUR CLOUDY (A) 05/17/2024 0114   LABSPEC >1.060 (H) 05/17/2024 0114  PHURINE 5.0 05/17/2024 0114   GLUCOSEU 50 (A) 05/17/2024 0114   HGBUR NEGATIVE 05/17/2024 0114   BILIRUBINUR NEGATIVE 05/17/2024 0114   KETONESUR 20 (A) 05/17/2024 0114   PROTEINUR 30 (A) 05/17/2024 0114   NITRITE NEGATIVE 05/17/2024 0114   LEUKOCYTESUR NEGATIVE 05/17/2024 0114    Sepsis Labs: Lactic Acid, Venous    Component Value Date/Time   LATICACIDVEN 0.9 05/16/2024 2145    MICROBIOLOGY: Recent Results (from the past 240 hours)  Resp panel by RT-PCR (RSV, Flu A&B, Covid) Anterior Nasal Swab     Status: None   Collection Time: 05/16/24  9:38 PM   Specimen: Anterior Nasal Swab  Result Value Ref Range Status   SARS Coronavirus 2 by RT PCR NEGATIVE NEGATIVE Final   Influenza A by PCR NEGATIVE NEGATIVE Final   Influenza B by PCR NEGATIVE NEGATIVE Final    Comment: (NOTE) The Xpert Xpress SARS-CoV-2/FLU/RSV plus assay is intended as an aid in the diagnosis of influenza from Nasopharyngeal swab specimens and should not be used as a sole basis for treatment. Nasal washings and aspirates are unacceptable for Xpert Xpress SARS-CoV-2/FLU/RSV testing.  Fact Sheet for Patients: bloggercourse.com  Fact Sheet for Healthcare  Providers: seriousbroker.it  This test is not yet approved or cleared by the [place]  FDA and has been authorized for detection and/or diagnosis of SARS-CoV-2 by FDA under an Emergency Use Authorization (EUA). This EUA will remain in effect (meaning this test can be used) for the duration of the COVID-19 declaration under Section 564(b)(1) of the Act, 21 U.S.C. section 360bbb-3(b)(1), unless the authorization is terminated or revoked.     Resp Syncytial Virus by PCR NEGATIVE NEGATIVE Final    Comment: (NOTE) Fact Sheet for Patients: bloggercourse.com  Fact Sheet for Healthcare Providers: seriousbroker.it  This test is not yet approved or cleared by the [place]  FDA and has been authorized for detection and/or diagnosis of SARS-CoV-2 by FDA under an Emergency Use Authorization (EUA). This EUA will remain in effect (meaning this test can be used) for the duration of the COVID-19 declaration under Section 564(b)(1) of the Act, 21 U.S.C. section 360bbb-3(b)(1), unless the authorization is terminated or revoked.  Performed at [facility], [address], [place], [place] [address]   Blood culture (routine x 2)     Status: None (Preliminary result)   Collection Time: 05/16/24  9:38 PM   Specimen: BLOOD RIGHT ARM  Result Value Ref Range Status   Specimen Description BLOOD RIGHT ARM  Final   Special Requests   Final    BOTTLES DRAWN AEROBIC ONLY Blood Culture adequate volume   Culture   Final    NO GROWTH 2 DAYS Performed at [facility], [address] [place], [place] [address]    Report Status PENDING  Incomplete  Blood culture (routine x 2)     Status: None (Preliminary result)   Collection Time: 05/16/24  9:38 PM   Specimen: BLOOD LEFT ARM  Result Value Ref Range Status   Specimen Description BLOOD LEFT ARM  Final   Special Requests   Final    BOTTLES DRAWN AEROBIC AND  ANAEROBIC Blood Culture adequate volume   Culture   Final    NO GROWTH 2 DAYS Performed at [facility], [address] [place], [place] [address]    Report Status PENDING  Incomplete       LOS: 1 day   Donalda Applebaum, MD  [facility]  To contact the attending provider between 7A-7P or the covering provider during after hours 7P-7A, please log into the web site [email or web address] and access using universal [place] password for that web site. If you do not have the password, please call the hospital operator.  05/18/2024, 9:11 AM    "

## 2024-05-18 NOTE — Plan of Care (Signed)

## 2024-05-19 LAB — CBC
HCT: 38.5 % — ABNORMAL LOW (ref 39.0–52.0)
Hemoglobin: 13.3 g/dL (ref 13.0–17.0)
MCH: 31.6 pg (ref 26.0–34.0)
MCHC: 34.5 g/dL (ref 30.0–36.0)
MCV: 91.4 fL (ref 80.0–100.0)
Platelets: 127 10*3/uL — ABNORMAL LOW (ref 150–400)
RBC: 4.21 MIL/uL — ABNORMAL LOW (ref 4.22–5.81)
RDW: 13.6 % (ref 11.5–15.5)
WBC: 7.4 10*3/uL (ref 4.0–10.5)
nRBC: 0 % (ref 0.0–0.2)

## 2024-05-19 LAB — BASIC METABOLIC PANEL WITH GFR
Anion gap: 7 (ref 5–15)
BUN: 11 mg/dL (ref 8–23)
CO2: 32 mmol/L (ref 22–32)
Calcium: 8.8 mg/dL — ABNORMAL LOW (ref 8.9–10.3)
Chloride: 99 mmol/L (ref 98–111)
Creatinine, Ser: 0.42 mg/dL — ABNORMAL LOW (ref 0.61–1.24)
GFR, Estimated: >60 mL/min
Glucose, Bld: 148 mg/dL — ABNORMAL HIGH (ref 70–99)
Potassium: 3.5 mmol/L (ref 3.5–5.1)
Sodium: 138 mmol/L (ref 135–145)

## 2024-05-19 LAB — MAGNESIUM: Magnesium: 1.9 mg/dL (ref 1.7–2.4)

## 2024-05-19 LAB — GLUCOSE, CAPILLARY: Glucose-Capillary: 129 mg/dL — ABNORMAL HIGH (ref 70–99)

## 2024-05-19 NOTE — Progress Notes (Signed)
 "                        PROGRESS NOTE        PATIENT DETAILS Name: Bradley Mcclure Age: 64 y.o. Sex: male Date of Birth: 10/23/1960 Admit Date: 05/16/2024 Admitting Physician Joette Pebbles, MD ERE:Ropwpr, Bonni Lien  Brief Summary: Patient is a 64 y.o.  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  Evaluated by neurosurgery-recommendations were to hold anticoagulation and proceed with cervical decompression on 2/3.  Significant events: 1/30>> admit to [facility].  Significant studies: 1/29>> MRI brain: No acute intracranial abnormality 1/29> MRI C-spine: New inflammation in the upper/mid cervical prevertebral soft tissue-severe spinal stenosis with cord flattening at C4-C6, moderate right-sided spinal stenosis with mild cord flattening C5-C6. 1/29>> MRI T-spine: No evidence of infection or compressive stenosis 1/29>> MRI L-spine: No evidence of lumbar spine infection-no spinal stenosis. 1/29>> CT abdomen/pelvis: No acute findings.  Significant microbiology data: 1/29>> COVID/influenza/RSV PCR: Negative 1/29>> blood culture: Negative  Procedures: None  Consults: Neurosurgery  Subjective: Lying comfortably in bed-no major issues.  Objective: Vitals: Blood pressure 128/79, pulse 89, temperature 97.8 F (36.6 C), temperature source Oral, resp. rate 16, height 6' 1 (1.854 m), weight 94 kg, SpO2 96%.   Exam: Awake/alert Chest: Clear to auscultation CVs: S1 and S2 regular Abdomen: Soft nontender Extremities: No edema Neurology: Generalized weakness but nonfocal.  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 Remains essentially unchanged-has diffuse generalized weakness but nonfocal Neurosurgery planning decompressive surgery 2/3  Leukocytosis Likely reactive No evidence of infection Thankfully has resolved Continue to monitor off antibiotics  Constipation MiraLAX /senna Add Dulcolax suppository  History of PE Off rivaroxaban -on prophylactic Lovenox  prior to surgery  Chronic HFpEF Euvolemic  HTN BP stable Per prior note-metoprolol  discontinued by cardiology during his most recent outpatient visit.  HLD Statin  DM-2 (A1c 5.3 on 1/30) CBG stable SSI while inpatient Resume metformin  on discharge.  Recent Labs    05/18/24 1159 05/18/24 1641 05/18/24 2132  GLUCAP 96 128* 155*     Mood disorder Seems stable Fluoxetine /Abilify    Code status:   Code Status: Full Code   DVT Prophylaxis:     Family Communication: None at bedside   Disposition Plan: Status is: Inpatient Remains inpatient appropriate because: Severity of illness   Planned Discharge Destination:Skilled nursing facility   Diet: Diet Order             Diet NPO time specified  Diet effective midnight           Diet heart healthy/carb modified Room service appropriate? Yes; Fluid consistency: Thin  Diet effective now                     Antimicrobial agents: Anti-infectives (From admission, onward)    Start     Dose/Rate Route Frequency Ordered Stop   05/21/24 0600  ceFAZolin  (ANCEF ) IVPB 2g/100 mL premix        2 g 200 mL/hr over 30 Minutes Intravenous On call to O.R. 05/17/24 1736  05/22/24 0559        MEDICATIONS: Scheduled Meds:  ARIPiprazole   2 mg Oral Daily   insulin  aspart  0-9 Units Subcutaneous TID WC   pantoprazole   40 mg Oral Daily   polyethylene glycol  17 g Oral BID   rosuvastatin   10 mg Oral Daily   senna  2 tablet Oral Daily   Continuous Infusions:  [START ON 05/21/2024]  ceFAZolin  (ANCEF ) IV     PRN Meds:.acetaminophen  **OR** acetaminophen , albuterol , bisacodyl , FLUoxetine , hydrALAZINE , morphine  injection   I have personally reviewed following labs and imaging studies  LABORATORY DATA: CBC: Recent Labs  Lab 05/14/24 1200 05/14/24 1213 05/16/24 0920 05/17/24 0422 05/18/24 0533 05/19/24 0430  WBC 6.9  --  18.4* 13.1* 8.0 7.4  NEUTROABS 5.0  --  16.1* 9.8*  --   --   HGB 15.9 15.6 15.3 14.1 12.9* 13.3  HCT 46.4 46.0 42.4 40.2 38.2* 38.5*  MCV 90.4  --  87.4 89.5 92.5 91.4  PLT 66*  --  160 141* 121* 127*    Basic Metabolic Panel: Recent Labs  Lab 05/14/24 1458 05/16/24 1213 05/17/24 0422 05/18/24 0533 05/19/24 0430  NA 134* 135 136 141 138  K 4.3 3.8 4.1 3.6 3.5  CL 100 99 99 103 99  CO2 21* 22 26 29  32  GLUCOSE 96 129* 118* 96 148*  BUN 7* 16 17 17 11   CREATININE 0.49* 0.52* 0.58* 0.46* 0.42*  CALCIUM  8.7* 9.1 9.2 8.6* 8.8*  MG  --   --   --  1.7 1.9  PHOS  --   --   --  2.8  --     GFR: Estimated Creatinine Clearance: 109.6 mL/min (A) (by C-G formula based on SCr of 0.42 mg/dL (L)).  Liver Function Tests: Recent Labs  Lab 05/14/24 1458 05/16/24 1213 05/17/24 0422  AST 22 50* 45*  ALT 17 20 20   ALKPHOS 55 55 54  BILITOT 0.7 0.9 0.9  PROT 6.4* 6.4* 6.2*  ALBUMIN 3.8 3.8 3.7   Recent Labs  Lab 05/16/24 1213  LIPASE 13   No results for input(s): AMMONIA in the last 168 hours.  Coagulation Profile: Recent Labs  Lab 05/14/24 1458  INR 1.1    Cardiac Enzymes: Recent Labs  Lab 05/16/24 1213  CKTOTAL 1,425*    BNP (last 3 results) No results for input(s): PROBNP in the last 8760  hours.  Lipid Profile: No results for input(s): CHOL, HDL, LDLCALC, TRIG, CHOLHDL, LDLDIRECT in the last 72 hours.  Thyroid  Function Tests: Recent Labs    05/18/24 0533  TSH 1.040    Anemia Panel: No results for input(s): VITAMINB12, FOLATE, FERRITIN, TIBC, IRON, RETICCTPCT in the last 72 hours.  Urine analysis:    Component Value Date/Time   COLORURINE YELLOW 05/17/2024 0114   APPEARANCEUR CLOUDY (A) 05/17/2024 0114   LABSPEC >1.060 (H) 05/17/2024 0114   PHURINE 5.0 05/17/2024 0114   GLUCOSEU 50 (A) 05/17/2024 0114   HGBUR NEGATIVE 05/17/2024 0114   BILIRUBINUR NEGATIVE 05/17/2024 0114   KETONESUR 20 (A)  05/17/2024 0114   PROTEINUR 30 (A) 05/17/2024 0114   NITRITE NEGATIVE 05/17/2024 0114   LEUKOCYTESUR NEGATIVE 05/17/2024 0114    Sepsis Labs: Lactic Acid, Venous    Component Value Date/Time   LATICACIDVEN 0.9 05/16/2024 2145    MICROBIOLOGY: Recent Results (from the past 240 hours)  Resp panel by RT-PCR (RSV, Flu A&B, Covid) Anterior Nasal Swab     Status: None   Collection Time: 05/16/24  9:38 PM   Specimen: Anterior Nasal Swab  Result Value Ref Range Status   SARS Coronavirus 2 by RT PCR NEGATIVE NEGATIVE Final   Influenza A by PCR NEGATIVE NEGATIVE Final   Influenza B by PCR NEGATIVE NEGATIVE Final    Comment: (NOTE) The Xpert Xpress SARS-CoV-2/FLU/RSV plus assay is intended as an aid in the diagnosis of influenza from Nasopharyngeal swab specimens and should not be used as a sole basis for treatment. Nasal washings and aspirates are unacceptable for Xpert Xpress SARS-CoV-2/FLU/RSV testing.  Fact Sheet for Patients: bloggercourse.com  Fact Sheet for Healthcare Providers: seriousbroker.it  This test is not yet approved or cleared by the [place]  FDA and has been authorized for detection and/or diagnosis of SARS-CoV-2 by FDA under an Emergency Use Authorization (EUA). This  EUA will remain in effect (meaning this test can be used) for the duration of the COVID-19 declaration under Section 564(b)(1) of the Act, 21 U.S.C. section 360bbb-3(b)(1), unless the authorization is terminated or revoked.     Resp Syncytial Virus by PCR NEGATIVE NEGATIVE Final    Comment: (NOTE) Fact Sheet for Patients: bloggercourse.com  Fact Sheet for Healthcare Providers: seriousbroker.it  This test is not yet approved or cleared by the [place]  FDA and has been authorized for detection and/or diagnosis of SARS-CoV-2 by FDA under an Emergency Use Authorization (EUA). This EUA will remain in effect (meaning this test can be used) for the duration of the COVID-19 declaration under Section 564(b)(1) of the Act, 21 U.S.C. section 360bbb-3(b)(1), unless the authorization is terminated or revoked.  Performed at [facility], [address], [place], [place] [address]   Blood culture (routine x 2)     Status: None (Preliminary result)   Collection Time: 05/16/24  9:38 PM   Specimen: BLOOD RIGHT ARM  Result Value Ref Range Status   Specimen Description BLOOD RIGHT ARM  Final   Special Requests   Final    BOTTLES DRAWN AEROBIC ONLY Blood Culture adequate volume   Culture   Final    NO GROWTH 3 DAYS Performed at [facility], [address], [place], [place] [address]    Report Status PENDING  Incomplete  Blood culture (routine x 2)     Status: None (Preliminary result)   Collection Time: 05/16/24  9:38 PM   Specimen: BLOOD LEFT ARM  Result Value Ref Range Status   Specimen Description BLOOD LEFT ARM  Final   Special Requests   Final    BOTTLES DRAWN AEROBIC AND ANAEROBIC Blood Culture adequate volume   Culture   Final    NO GROWTH 3 DAYS Performed at [facility], [address] [place], [place] [address]    Report Status PENDING  Incomplete       LOS: 2 days   Donalda Applebaum, MD  [facility]    To contact the attending provider between 7A-7P or the covering provider during after hours 7P-7A, please log into the web site [email or web address] and access using universal [place]  password for that web site. If you do not have the password, please call the hospital operator.  05/19/2024, 10:38 AM    "

## 2024-05-19 NOTE — Plan of Care (Signed)

## 2024-05-19 NOTE — Plan of Care (Signed)

## 2024-05-20 LAB — GLUCOSE, CAPILLARY
Glucose-Capillary: 201 mg/dL — ABNORMAL HIGH (ref 70–99)
Glucose-Capillary: 174 mg/dL — ABNORMAL HIGH (ref 70–99)
Glucose-Capillary: 183 mg/dL — ABNORMAL HIGH (ref 70–99)
Glucose-Capillary: 139 mg/dL — ABNORMAL HIGH (ref 70–99)

## 2024-05-20 MED ORDER — METOPROLOL TARTRATE 25 MG PO TABS
25.0000 mg | ORAL_TABLET | Freq: Two times a day (BID) | ORAL | Status: AC
  Administered 2024-05-20 – 2024-05-24 (×9): 25 mg via ORAL
  Filled 2024-05-20 (×9): qty 1

## 2024-05-20 MED ORDER — ONDANSETRON HCL 4 MG/2ML IJ SOLN
4.0000 mg | Freq: Once | INTRAMUSCULAR | Status: AC
  Administered 2024-05-20: 4 mg via INTRAVENOUS
  Filled 2024-05-20: qty 2

## 2024-05-20 MED ORDER — LABETALOL HCL 5 MG/ML IV SOLN
10.0000 mg | INTRAVENOUS | Status: AC | PRN
  Administered 2024-05-20 (×2): 10 mg via INTRAVENOUS
  Filled 2024-05-20: qty 4

## 2024-05-20 NOTE — TOC Initial Note (Addendum)
 Transition of Care [facility]) - Initial/Assessment Note    Patient Details  Name: Bradley Mcclure MRN: [id number] Date of Birth: [birth year]  Transition of Care [facility]) CM/SW Contact:    Bradley DELENA Senters, RN Phone Number: 05/20/2024, 9:54 AM  Clinical Narrative:                 RR:fziprjo history significant of HLD, HTN , IBS, Depression, Mild Cognitive D/o  NAFLD, Hx of PE, DMII, who presents to ED for the second time in 3 days with  fall and debility.  Initially on 1/27 patient had mechanical fall on ice with head strike s/p which on evaluation he was noted to be slow to respond but was alert and oriented.  He had full evaluation and noted to have no fractures. He was seen by physical therapy and cleared for discharge home.  Patient now returns 3 days later [name] with s/p fall after being found down at home for unknown length of time. Patient states he has not had anything to eat of drink for 2 days. He notes no LOC of head trauma with fall. Patient continued to complain of neck and back pain, noted no improvement since last evaluation.   Patient lives alone, reports he has no reliable support at home, he will need transportation assistance home at discharge.   Patient has PCP, medications set up by [facility], reports he drives himself to appts, DME reviewed-RW, rollator. Patient is active with [facility]. Patient has HHPT and RN through [facility].   Therapy rec for SNF. CM discussed this with patient. Despite CM education, patient refuses SNF at this time.   HIPAA compliant VM left for [facility].   CM will continue to follow.   1510: received callback from [facility].  Patient is active with Bradley Mcclure, PCP is Bradley Mcclure. SW is Bradley Mcclure at [phone] ext. [phone]. Patient has full VA benefits, Medicare A and B, and is not service connected.   Expected Discharge Plan:  (TBD) Barriers to Discharge: Continued Medical Work up   Patient Goals and CMS Choice             Expected Discharge Plan and Services       Living arrangements for the past 2 months: Apartment                                      Prior Living Arrangements/Services Living arrangements for the past 2 months: Apartment Lives with:: Self Patient language and need for interpreter reviewed:: Yes Do you feel safe going back to the place where you live?: Yes      Need for Family Participation in Patient Care: Yes (Comment) Care giver support system in place?: No (comment) Current home services: Home PT, DME (HHPT through [facility], rollator, RW) Criminal Activity/Legal Involvement Pertinent to Current Situation/Hospitalization: No - Comment as needed  Activities of Daily Living   ADL Screening (condition at time of admission) Independently performs ADLs?: Yes (appropriate for developmental age) Is the patient deaf or have difficulty hearing?: No Does the patient have difficulty seeing, even when wearing glasses/contacts?: No Does the patient have difficulty concentrating, remembering, or making decisions?: No  Permission Sought/Granted                  Emotional Assessment Appearance:: Appears older than stated age Attitude/Demeanor/Rapport: Engaged Affect (typically observed): Calm Orientation: : Oriented to Self, Oriented to  Place, Oriented to  Time, Oriented to Situation Alcohol / Substance Use: Not Applicable Psych Involvement: No (comment)  Admission diagnosis:  Cervical myelopathy (HCC) [G95.9] Spinal stenosis [M48.00] Cervical stenosis of spine [M48.02] SIRS (systemic inflammatory response syndrome) (HCC) [R65.10] Weakness of both lower extremities [R29.898] Patient Active Problem List   Diagnosis Date Noted   Spinal stenosis 05/17/2024   Cervical stenosis of spine 05/17/2024   Long term current use of anticoagulant therapy 09/18/2023   Major depressive disorder, recurrent severe without psychotic features (HCC) 09/18/2023   Mild cognitive  impairment of uncertain or unknown etiology 09/18/2023   Acute hypoxemic respiratory failure (HCC) 09/18/2023   Aspiration pneumonitis (HCC) 08/30/2023   Aspiration into airway 08/24/2023   SIRS (systemic inflammatory response syndrome) (HCC) 08/24/2023   Oropharyngeal dysphagia 08/24/2023   Acute hypoxic respiratory failure (HCC) 08/24/2023   Pneumonia 09/06/2022   Hyperlipidemia 09/06/2022   Generalized anxiety disorder 09/06/2022   Major depressive disorder 09/06/2022   Type 2 diabetes mellitus with diabetic neuropathy, unspecified (HCC) 09/06/2022   History of retinal detachment 09/06/2022   Sepsis due to Staphylococcus aureus (HCC) 03/01/2016   Cellulitis of left lower extremity    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   Cellulitis and abscess 12/24/2015   Balanitis 12/24/2015   Uncontrolled type 2 diabetes mellitus with hyperglycemia, with long-term current use of insulin  (HCC)    Bacteremia    Morbid obesity due to excess calories (HCC)    Cellulitis of abdominal wall 11/11/2015   Sepsis (HCC) 11/11/2015   Rash 11/11/2015   Hypertension    Diabetes mellitus without complication (HCC)    Candida-induced panniculitis    GERD (gastroesophageal reflux disease) 04/19/2015   Pulmonary embolism (HCC) 03/21/2013   Non-alcoholic fatty liver disease 04/19/2011   PCP:  Clinic, Bradley Mcclure Pharmacy:   [facility] - [place], [place] - [address] [place] [address] Phone: [phone] Fax: [phone]  [facility] - [place], [place] - [address] [place] [address] Phone: [phone] Fax: [phone]  [facility] [address] [place] [address] Phone: [phone] Fax: [phone]     Social Drivers of Health (SDOH) Social History: SDOH Screenings   Food Insecurity: No Food Insecurity (05/17/2024)  Housing: Low Risk  (05/17/2024)  Transportation Needs: No Transportation Needs (05/17/2024)  Utilities: Not At Risk (05/17/2024)  Tobacco Use: Low Risk (05/17/2024)   SDOH Interventions:     Readmission Risk Interventions     No data to display

## 2024-05-20 NOTE — Progress Notes (Addendum)
 Assessment 64 year old male history of DM2 A1c 5.4, PE on Xarelto , remote cellulitis with sepsis who presents with 3 weeks of progressive cervical myelopathy due to C4-C6 stenosis and OPLL   LOS: 3 days    Plan: Posterior cervical decompression fusion tomorrow NPO@MN  Ok for therapeutic lovenox  today. Hold tomorrow morning   Subjective: Neurologically stable.  I explained his spinal cord compression in detail and showed him the images. Alternative to surgery would be careful observation although I do not believe that would be beneficial in the setting of worsening myelopathy. I explained the risks including but not limited to bleeding, infection, nerve injury, spinal cord injury, damage to nearby organs, csf leak, general anesthesia risk, cardiorespiratory risk, mortality, stroke, hardware failure. He verbalized understanding  Objective: Vital signs in last 24 hours: Temp:  [97.7 F (36.5 C)-98.1 F (36.7 C)] 97.9 F (36.6 C) (02/02 0757) Pulse Rate:  [84-104] 104 (02/02 0757) Resp:  [17-24] 24 (02/02 0757) BP: (123-185)/(75-105) 185/105 (02/02 0757) SpO2:  [92 %-94 %] 94 % (02/02 0757)  Intake/Output from previous day: 02/01 0701 - 02/02 0700 In: -  Out: 1100 [Urine:1100] Intake/Output this shift: No intake/output data recorded.  Exam: GCS 4E 5V 1M  Oriented and coherent, but very slow/delayed to answer questions No aphasia or dysarthria PERRL EOMI, conjugate gaze FS TM BUE: 4/5 throughout BLE: 5/5 throughout Subjective paresthesias b/l hands palmar and dorsal side Urinary catheter in place    Lab Results: Recent Labs    05/18/24 0533 05/19/24 0430  WBC 8.0 7.4  HGB 12.9* 13.3  HCT 38.2* 38.5*  PLT 121* 127*   BMET Recent Labs    05/18/24 0533 05/19/24 0430  NA 141 138  K 3.6 3.5  CL 103 99  CO2 29 32  GLUCOSE 96 148*  BUN 17 11  CREATININE 0.46* 0.42*  CALCIUM  8.6* 8.8*       Dorn SAUNDERS Eston Heslin 05/20/2024, 8:39 AM

## 2024-05-20 NOTE — Progress Notes (Signed)
 Pt Hypertenive Bp running 180s-160s systolic. MD Ghimire made aware. Orders for Metoprolol  and Labetalol  received. Pt medicated per MAR.

## 2024-05-20 NOTE — TOC Progression Note (Signed)
 Transition of Care [facility]) - Progression Note    Patient Details  Name: Bradley Mcclure MRN: [id number] Date of Birth: 08/28/1960  Transition of Care [facility]) CM/SW Contact  Inocente GORMAN Kindle, LCSW Phone Number: 05/20/2024, 11:33 AM  Clinical Narrative:    CSW had met with patient to discuss SNF recommendation. Patient stated he needed to return home to pay bills instead of going to SNF. CSW inquired if anyone could help him do that so that he could rehab at [facility] but patient stated there was no one and he did not have online access. CSW asked if he could call his bill companies to see if they can delay his payment with hospital documentation but he stated that would not help. CSW asked who would help him at home and he stated there is no one to help him at home and would not have a ride. ICM continuing to follow.     Expected Discharge Plan:  (TBD) Barriers to Discharge: Continued Medical Work up               Expected Discharge Plan and Services In-house Referral: Clinical Social Work Discharge Planning Services: CM Consult   Living arrangements for the past 2 months: Apartment                                       Social Drivers of Health (SDOH) Interventions SDOH Screenings   Food Insecurity: No Food Insecurity (05/17/2024)  Housing: Low Risk (05/17/2024)  Transportation Needs: No Transportation Needs (05/17/2024)  Utilities: Not At Risk (05/17/2024)  Tobacco Use: Low Risk (05/17/2024)    Readmission Risk Interventions     No data to display

## 2024-05-21 ENCOUNTER — Encounter (HOSPITAL_COMMUNITY): Payer: Self-pay | Admitting: Internal Medicine

## 2024-05-21 ENCOUNTER — Other Ambulatory Visit: Payer: Self-pay

## 2024-05-21 ENCOUNTER — Inpatient Hospital Stay (HOSPITAL_COMMUNITY): Admitting: Anesthesiology

## 2024-05-21 ENCOUNTER — Encounter (HOSPITAL_COMMUNITY)
Admission: EM | Disposition: A | Discharge status: Skilled Nursing Facility | Payer: Self-pay | Source: Home / Self Care | Attending: Internal Medicine

## 2024-05-21 ENCOUNTER — Inpatient Hospital Stay (HOSPITAL_COMMUNITY)

## 2024-05-21 LAB — CULTURE, BLOOD (ROUTINE X 2)
Culture: NO GROWTH
Culture: NO GROWTH
Special Requests: ADEQUATE
Special Requests: ADEQUATE

## 2024-05-21 LAB — CBC
HCT: 45.5 % (ref 39.0–52.0)
Hemoglobin: 16 g/dL (ref 13.0–17.0)
MCH: 31.4 pg (ref 26.0–34.0)
MCHC: 35.2 g/dL (ref 30.0–36.0)
MCV: 89.2 fL (ref 80.0–100.0)
Platelets: 162 10*3/uL (ref 150–400)
RBC: 5.1 MIL/uL (ref 4.22–5.81)
RDW: 13.3 % (ref 11.5–15.5)
WBC: 20 10*3/uL — ABNORMAL HIGH (ref 4.0–10.5)
nRBC: 0 % (ref 0.0–0.2)

## 2024-05-21 LAB — TYPE AND SCREEN
ABO/RH(D): A POS
Antibody Screen: NEGATIVE

## 2024-05-21 LAB — BASIC METABOLIC PANEL WITH GFR
Anion gap: 11 (ref 5–15)
BUN: 9 mg/dL (ref 8–23)
CO2: 30 mmol/L (ref 22–32)
Calcium: 9.7 mg/dL (ref 8.9–10.3)
Chloride: 90 mmol/L — ABNORMAL LOW (ref 98–111)
Creatinine, Ser: 0.46 mg/dL — ABNORMAL LOW (ref 0.61–1.24)
GFR, Estimated: >60 mL/min
Glucose, Bld: 202 mg/dL — ABNORMAL HIGH (ref 70–99)
Potassium: 3.6 mmol/L (ref 3.5–5.1)
Sodium: 130 mmol/L — ABNORMAL LOW (ref 135–145)

## 2024-05-21 LAB — GLUCOSE, CAPILLARY
Glucose-Capillary: 207 mg/dL — ABNORMAL HIGH (ref 70–99)
Glucose-Capillary: 184 mg/dL — ABNORMAL HIGH (ref 70–99)
Glucose-Capillary: 198 mg/dL — ABNORMAL HIGH (ref 70–99)
Glucose-Capillary: 228 mg/dL — ABNORMAL HIGH (ref 70–99)

## 2024-05-21 LAB — SURGICAL PCR SCREEN
MRSA, PCR: NEGATIVE
Staphylococcus aureus: NEGATIVE

## 2024-05-21 MED ORDER — OXYCODONE HCL 5 MG PO TABS: 5.0000 mg | ORAL_TABLET | Freq: Once | ORAL | Status: DC | PRN

## 2024-05-21 MED ORDER — LACTATED RINGERS IV SOLN: INTRAVENOUS | Status: DC

## 2024-05-21 MED ORDER — CHLORHEXIDINE GLUCONATE 0.12 % MT SOLN: 15.0000 mL | Freq: Once | OROMUCOSAL | Status: AC

## 2024-05-21 MED ORDER — PROPOFOL 10 MG/ML IV BOLUS
INTRAVENOUS | Status: AC
  Filled 2024-05-21: qty 20

## 2024-05-21 MED ORDER — FENTANYL CITRATE (PF) 250 MCG/5ML IJ SOLN
INTRAMUSCULAR | Status: DC | PRN
  Administered 2024-05-21 (×5): 50 ug via INTRAVENOUS

## 2024-05-21 MED ORDER — PROPOFOL 10 MG/ML IV BOLUS
INTRAVENOUS | Status: DC | PRN
  Administered 2024-05-21: 100 mg via INTRAVENOUS

## 2024-05-21 MED ORDER — CHLORHEXIDINE GLUCONATE CLOTH 2 % EX PADS
6.0000 | MEDICATED_PAD | Freq: Once | CUTANEOUS | Status: AC
  Administered 2024-05-21: 6 via TOPICAL

## 2024-05-21 MED ORDER — SUCCINYLCHOLINE CHLORIDE 200 MG/10ML IV SOSY
PREFILLED_SYRINGE | INTRAVENOUS | Status: AC
  Filled 2024-05-21: qty 10

## 2024-05-21 MED ORDER — BUPIVACAINE-EPINEPHRINE 0.5% -1:200000 IJ SOLN
INTRAMUSCULAR | Status: DC | PRN
  Administered 2024-05-21: 10 mL

## 2024-05-21 MED ORDER — BACITRACIN ZINC 500 UNIT/GM EX OINT
TOPICAL_OINTMENT | CUTANEOUS | Status: DC | PRN
  Administered 2024-05-21: 1 via TOPICAL

## 2024-05-21 MED ORDER — MIDAZOLAM HCL (PF) 2 MG/2ML IJ SOLN
INTRAMUSCULAR | Status: DC | PRN
  Administered 2024-05-21: 1 mg via INTRAVENOUS

## 2024-05-21 MED ORDER — BUPIVACAINE-EPINEPHRINE (PF) 0.5% -1:200000 IJ SOLN
INTRAMUSCULAR | Status: AC
  Filled 2024-05-21: qty 30

## 2024-05-21 MED ORDER — CEFAZOLIN SODIUM-DEXTROSE 2-4 GM/100ML-% IV SOLN
INTRAVENOUS | Status: AC
  Filled 2024-05-21: qty 100

## 2024-05-21 MED ORDER — SURGIRINSE WOUND IRRIGATION SYSTEM - OPTIME: TOPICAL | Status: DC | PRN

## 2024-05-21 MED ORDER — INSULIN ASPART 100 UNIT/ML IJ SOLN
2.0000 [IU] | Freq: Once | INTRAMUSCULAR | Status: AC
  Administered 2024-05-21: 2 [IU] via SUBCUTANEOUS

## 2024-05-21 MED ORDER — OXYCODONE HCL 5 MG/5ML PO SOLN: 5.0000 mg | Freq: Once | ORAL | Status: DC | PRN

## 2024-05-21 MED ORDER — 0.9 % SODIUM CHLORIDE (POUR BTL) OPTIME
TOPICAL | Status: DC | PRN
  Administered 2024-05-21 (×2): 1000 mL

## 2024-05-21 MED ORDER — HEMOSTATIC AGENTS (NO CHARGE) OPTIME
TOPICAL | Status: DC | PRN
  Administered 2024-05-21: 1 via TOPICAL

## 2024-05-21 MED ORDER — THROMBIN 5000 UNITS EX KIT
PACK | CUTANEOUS | Status: AC
  Filled 2024-05-21: qty 1

## 2024-05-21 MED ORDER — LIDOCAINE 2% (20 MG/ML) 5 ML SYRINGE
INTRAMUSCULAR | Status: DC | PRN
  Administered 2024-05-21: 60 mg via INTRAVENOUS

## 2024-05-21 MED ORDER — PROPOFOL 1000 MG/100ML IV EMUL
INTRAVENOUS | Status: AC
  Filled 2024-05-21: qty 100

## 2024-05-21 MED ORDER — PHENYLEPHRINE HCL-NACL 20-0.9 MG/250ML-% IV SOLN
INTRAVENOUS | Status: DC | PRN
  Administered 2024-05-21: 40 ug/min via INTRAVENOUS

## 2024-05-21 MED ORDER — ONDANSETRON HCL 4 MG/2ML IJ SOLN
INTRAMUSCULAR | Status: DC | PRN
  Administered 2024-05-21: 4 mg via INTRAVENOUS

## 2024-05-21 MED ORDER — FENTANYL CITRATE (PF) 100 MCG/2ML IJ SOLN: 25.0000 ug | INTRAMUSCULAR | Status: DC | PRN

## 2024-05-21 MED ORDER — ONDANSETRON HCL 4 MG/2ML IJ SOLN: 4.0000 mg | Freq: Once | INTRAMUSCULAR | Status: DC | PRN

## 2024-05-21 MED ORDER — ACETAMINOPHEN 500 MG PO TABS
ORAL_TABLET | ORAL | Status: AC
  Administered 2024-05-21: 1000 mg via ORAL
  Filled 2024-05-21: qty 2

## 2024-05-21 MED ORDER — DEXAMETHASONE SOD PHOSPHATE PF 10 MG/ML IJ SOLN
INTRAMUSCULAR | Status: DC | PRN
  Administered 2024-05-21: 10 mg via INTRAVENOUS

## 2024-05-21 MED ORDER — MIDAZOLAM HCL 2 MG/2ML IJ SOLN
INTRAMUSCULAR | Status: AC
  Filled 2024-05-21: qty 2

## 2024-05-21 MED ORDER — LIDOCAINE 2% (20 MG/ML) 5 ML SYRINGE
INTRAMUSCULAR | Status: AC
  Filled 2024-05-21: qty 5

## 2024-05-21 MED ORDER — ACETAMINOPHEN 500 MG PO TABS: 1000.0000 mg | ORAL_TABLET | Freq: Once | ORAL | Status: AC

## 2024-05-21 MED ORDER — THROMBIN 5000 UNITS EX SOLR
OROMUCOSAL | Status: DC | PRN
  Administered 2024-05-21 (×2): 5 mL via TOPICAL

## 2024-05-21 MED ORDER — DEXAMETHASONE SOD PHOSPHATE PF 10 MG/ML IJ SOLN
INTRAMUSCULAR | Status: AC
  Filled 2024-05-21: qty 1

## 2024-05-21 MED ORDER — PROPOFOL 500 MG/50ML IV EMUL
INTRAVENOUS | Status: DC | PRN
  Administered 2024-05-21: 150 ug/kg/min via INTRAVENOUS

## 2024-05-21 MED ORDER — SUGAMMADEX SODIUM 200 MG/2ML IV SOLN
INTRAVENOUS | Status: DC | PRN
  Administered 2024-05-21: 350 mg via INTRAVENOUS

## 2024-05-21 MED ORDER — BACITRACIN ZINC 500 UNIT/GM EX OINT
TOPICAL_OINTMENT | CUTANEOUS | Status: AC
  Filled 2024-05-21: qty 28.35

## 2024-05-21 MED ORDER — OXYCODONE HCL 5 MG PO TABS
5.0000 mg | ORAL_TABLET | ORAL | Status: AC | PRN
  Administered 2024-05-22 – 2024-05-23 (×2): 5 mg via ORAL
  Filled 2024-05-21 (×2): qty 1

## 2024-05-21 MED ORDER — ORAL CARE MOUTH RINSE: 15.0000 mL | Freq: Once | OROMUCOSAL | Status: AC

## 2024-05-21 MED ORDER — LACTATED RINGERS IV SOLN: INTRAVENOUS | Status: DC | PRN

## 2024-05-21 MED ORDER — ONDANSETRON HCL 4 MG/2ML IJ SOLN
INTRAMUSCULAR | Status: AC
  Filled 2024-05-21: qty 2

## 2024-05-21 MED ORDER — FENTANYL CITRATE (PF) 250 MCG/5ML IJ SOLN
INTRAMUSCULAR | Status: AC
  Filled 2024-05-21: qty 5

## 2024-05-21 MED ORDER — MORPHINE SULFATE (PF) 2 MG/ML IV SOLN: 2.0000 mg | INTRAVENOUS | Status: AC | PRN

## 2024-05-21 MED ORDER — ROCURONIUM BROMIDE 10 MG/ML (PF) SYRINGE
PREFILLED_SYRINGE | INTRAVENOUS | Status: DC | PRN
  Administered 2024-05-21 (×2): 30 mg via INTRAVENOUS

## 2024-05-21 MED ORDER — CHLORHEXIDINE GLUCONATE 0.12 % MT SOLN
OROMUCOSAL | Status: AC
  Administered 2024-05-21: 15 mL via OROMUCOSAL
  Filled 2024-05-21: qty 15

## 2024-05-21 MED ORDER — SUCCINYLCHOLINE CHLORIDE 200 MG/10ML IV SOSY
PREFILLED_SYRINGE | INTRAVENOUS | Status: DC | PRN
  Administered 2024-05-21: 160 mg via INTRAVENOUS

## 2024-05-21 MED ORDER — CYCLOBENZAPRINE HCL 5 MG PO TABS
10.0000 mg | ORAL_TABLET | Freq: Three times a day (TID) | ORAL | Status: AC
  Administered 2024-05-21 – 2024-05-24 (×9): 10 mg via ORAL
  Filled 2024-05-21 (×9): qty 2

## 2024-05-21 MED ORDER — PHENYLEPHRINE 80 MCG/ML (10ML) SYRINGE FOR IV PUSH (FOR BLOOD PRESSURE SUPPORT)
PREFILLED_SYRINGE | INTRAVENOUS | Status: DC | PRN
  Administered 2024-05-21 (×3): 160 ug via INTRAVENOUS
  Administered 2024-05-21: 80 ug via INTRAVENOUS
  Administered 2024-05-21 (×2): 160 ug via INTRAVENOUS

## 2024-05-21 NOTE — Plan of Care (Signed)
  Problem: Skin Integrity: Goal: Risk for impaired skin integrity will decrease Outcome: Progressing   Problem: Clinical Measurements: Goal: Will remain free from infection Outcome: Progressing Goal: Diagnostic test results will improve Outcome: Progressing Goal: Respiratory complications will improve Outcome: Progressing Goal: Cardiovascular complication will be avoided Outcome: Progressing

## 2024-05-21 NOTE — Progress Notes (Signed)
 "                        PROGRESS NOTE        PATIENT DETAILS Name: Bradley Mcclure Age: 64 y.o. Sex: male Date of Birth: 02-10-1961 Admit Date: 05/16/2024 Admitting Physician Joette Pebbles, MD ERE:Ropwpr, Bonni Lien  Brief Summary: Patient is a 64 y.o.  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  Evaluated by neurosurgery-recommendations were to hold anticoagulation and proceed with cervical decompression on 2/3.  Significant events: 1/30>> admit to [facility].  Significant studies: 1/29>> MRI brain: No acute intracranial abnormality 1/29> MRI C-spine: New inflammation in the upper/mid cervical prevertebral soft tissue-severe spinal stenosis with cord flattening at C4-C6, moderate right-sided spinal stenosis with mild cord flattening C5-C6. 1/29>> MRI T-spine: No evidence of infection or compressive stenosis 1/29>> MRI L-spine: No evidence of lumbar spine infection-no spinal stenosis. 1/29>> CT abdomen/pelvis: No acute findings.  Significant microbiology data: 1/29>> COVID/influenza/RSV PCR: Negative 1/29>> blood culture: Negative  Procedures: None  Consults: Neurosurgery  Subjective: Awake alert-no major issues-denies any nausea, vomiting or diarrhea.  Denies any shortness of breath or chest pain.  No cough.  Objective: Vitals: Blood pressure 136/82, pulse 94, temperature 97.6 F (36.4 C), temperature source Oral, resp. rate 17, height 6' 1 (1.854 m), weight 94 kg, SpO2 95%.   Exam: Gen Exam:Alert awake-not in any distress HEENT:atraumatic, normocephalic Chest: B/L clear to auscultation anteriorly CVS:S1S2 regular Abdomen:soft non tender, non distended Extremities:no edema Neurology: Non focal-but with generalized  weakness Skin: no rash  Pertinent Labs/Radiology:    Latest Ref Rng & Units 05/21/2024    2:43 AM 05/19/2024    4:30 AM 05/18/2024    5:33 AM  CBC  WBC 4.0 - 10.5 K/uL 20.0  7.4  8.0   Hemoglobin 13.0 - 17.0 g/dL 83.9  86.6  87.0   Hematocrit 39.0 - 52.0 % 45.5  38.5  38.2   Platelets 150 - 400 K/uL 162  127  121     Lab Results  Component Value Date   NA 130 (L) 05/21/2024   K 3.6 05/21/2024   CL 90 (L) 05/21/2024   CO2 30 05/21/2024      Assessment/Plan: Gait instability/frequent falls-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 Remains essentially unchanged-has diffuse generalized weakness but nonfocal Neurosurgery planning decompressive surgery 2/3  Leukocytosis Likely reactive No evidence of infection Had resolved but again has leukocytosis this morning-without any symptoms or suggestion of infection Continue to monitor off antibiotics  Constipation Last BM 2/1 MiraLAX /senna As needed Dulcolax suppository  History of PE Off rivaroxaban -as scheduled for C-spine decompressive surgery 2/3.  Chronic HFpEF Euvolemic  HTN BP stable on metoprolol  Per prior note-metoprolol  discontinued by cardiology during his most recent outpatient visit-however since BP was elevated-he has been restarted on it during this hospitalization-please follow and adjust accordingly.    HLD Statin  DM-2 (A1c 5.3 on 1/30) CBG stable SSI while inpatient Resume metformin  on discharge.  Recent Labs    05/20/24 1631 05/20/24 2141 05/21/24 0744  GLUCAP 183* 139* 207*     Mood disorder Seems stable Fluoxetine /Abilify    Code status:   Code Status: Full Code   DVT Prophylaxis: SCD's Start: 05/21/24 0600    Family Communication: Alyse Anes [phone] left  voicemail on 2/2   Disposition Plan: Status is: Inpatient Remains inpatient appropriate because: Severity of illness   Planned Discharge Destination:Skilled nursing  facility   Diet: Diet Order             Diet NPO time specified  Diet effective midnight                     Antimicrobial agents: Anti-infectives (From admission, onward)    Start     Dose/Rate Route Frequency Ordered Stop   05/21/24 1051  ceFAZolin  (ANCEF ) 2-4 GM/100ML-% IVPB       Note to Pharmacy: Carolynn Friday E: cabinet override      05/21/24 1051 05/21/24 2259   05/21/24 0600  ceFAZolin  (ANCEF ) IVPB 2g/100 mL premix        2 g 200 mL/hr over 30 Minutes Intravenous On call to O.R. 05/17/24 1736 05/22/24 0559        MEDICATIONS: Scheduled Meds:  acetaminophen        ARIPiprazole   2 mg Oral Daily   chlorhexidine        Chlorhexidine  Gluconate Cloth  6 each Topical Once   insulin  aspart  0-9 Units Subcutaneous TID WC   metoprolol  tartrate  25 mg Oral BID   pantoprazole   40 mg Oral Daily   polyethylene glycol  17 g Oral BID   rosuvastatin   10 mg Oral Daily   senna  2 tablet Oral Daily   Continuous Infusions:  ceFAZolin       ceFAZolin  (ANCEF ) IV     PRN Meds:.acetaminophen , acetaminophen  **OR** acetaminophen , albuterol , bisacodyl , ceFAZolin , chlorhexidine , FLUoxetine , hydrALAZINE , labetalol , morphine  injection   I have personally reviewed following labs and imaging studies  LABORATORY DATA: CBC: Recent Labs  Lab 05/14/24 1200 05/14/24 1213 05/16/24 0920 05/17/24 0422 05/18/24 0533 05/19/24 0430 05/21/24 0243  WBC 6.9  --  18.4* 13.1* 8.0 7.4 20.0*  NEUTROABS 5.0  --  16.1* 9.8*  --   --   --   HGB 15.9   < > 15.3 14.1 12.9* 13.3 16.0  HCT 46.4   < > 42.4 40.2 38.2* 38.5* 45.5  MCV 90.4  --  87.4 89.5 92.5 91.4 89.2  PLT 66*  --  160 141* 121* 127* 162   < > = values in this interval not displayed.    Basic Metabolic Panel: Recent Labs  Lab 05/16/24 1213 05/17/24 0422 05/18/24 0533 05/19/24 0430 05/21/24 0243  NA 135 136 141 138 130*  K 3.8 4.1 3.6 3.5 3.6  CL 99 99 103 99 90*  CO2 22 26 29  32 30  GLUCOSE 129* 118* 96 148* 202*   BUN 16 17 17 11 9   CREATININE 0.52* 0.58* 0.46* 0.42* 0.46*  CALCIUM  9.1 9.2 8.6* 8.8* 9.7  MG  --   --  1.7 1.9  --   PHOS  --   --  2.8  --   --     GFR: Estimated Creatinine Clearance: 109.6 mL/min (A) (by C-G formula based on SCr of 0.46 mg/dL (L)).  Liver Function Tests: Recent Labs  Lab 05/14/24 1458 05/16/24 1213 05/17/24 0422  AST 22 50* 45*  ALT 17 20 20   ALKPHOS 55 55 54  BILITOT 0.7 0.9 0.9  PROT 6.4* 6.4* 6.2*  ALBUMIN 3.8 3.8 3.7   Recent Labs  Lab 05/16/24 1213  LIPASE 13   No results for input(s): AMMONIA in the last 168 hours.  Coagulation Profile: Recent Labs  Lab 05/14/24 1458  INR 1.1    Cardiac Enzymes: Recent Labs  Lab 05/16/24 1213  CKTOTAL 1,425*    BNP (last 3 results) No results for input(s): PROBNP in the last 8760 hours.  Lipid Profile: No results for input(s): CHOL, HDL, LDLCALC, TRIG, CHOLHDL, LDLDIRECT in the last 72 hours.  Thyroid  Function Tests: No results for input(s): TSH, T4TOTAL, FREET4, T3FREE, THYROIDAB in the last 72 hours.   Anemia Panel: No results for input(s): VITAMINB12, FOLATE, FERRITIN, TIBC, IRON, RETICCTPCT in the last 72 hours.  Urine analysis:    Component Value Date/Time   COLORURINE YELLOW 05/17/2024 0114   APPEARANCEUR CLOUDY (A) 05/17/2024 0114   LABSPEC >1.060 (H) 05/17/2024 0114   PHURINE 5.0 05/17/2024 0114   GLUCOSEU 50 (A) 05/17/2024 0114   HGBUR NEGATIVE 05/17/2024 0114   BILIRUBINUR NEGATIVE 05/17/2024 0114   KETONESUR 20 (A) 05/17/2024 0114   PROTEINUR 30 (A) 05/17/2024 0114   NITRITE NEGATIVE 05/17/2024 0114   LEUKOCYTESUR NEGATIVE 05/17/2024 0114    Sepsis Labs: Lactic Acid, Venous    Component Value Date/Time   LATICACIDVEN 0.9 05/16/2024 2145    MICROBIOLOGY: Recent Results (from the past 240 hours)  Resp panel by RT-PCR (RSV, Flu A&B, Covid) Anterior Nasal Swab     Status: None   Collection Time: 05/16/24  9:38 PM   Specimen:  Anterior Nasal Swab  Result Value Ref Range Status   SARS Coronavirus 2 by RT PCR NEGATIVE NEGATIVE Final   Influenza A by PCR NEGATIVE NEGATIVE Final   Influenza B by PCR NEGATIVE NEGATIVE Final    Comment: (NOTE) The Xpert Xpress SARS-CoV-2/FLU/RSV plus assay is intended as an aid in the diagnosis of influenza from Nasopharyngeal swab specimens and should not be used as a sole basis for treatment. Nasal washings and aspirates are unacceptable for Xpert Xpress SARS-CoV-2/FLU/RSV testing.  Fact Sheet for Patients: bloggercourse.com  Fact Sheet for Healthcare Providers: seriousbroker.it  This test is not yet approved or cleared by the [place]  FDA and has been authorized for detection and/or diagnosis of SARS-CoV-2 by FDA under an Emergency Use Authorization (EUA). This EUA will remain in effect (meaning this test can be used) for the duration of the COVID-19 declaration under Section 564(b)(1) of the Act, 21 U.S.C. section 360bbb-3(b)(1), unless the authorization is terminated or revoked.     Resp Syncytial Virus by PCR NEGATIVE NEGATIVE Final    Comment: (NOTE) Fact Sheet for Patients: bloggercourse.com  Fact Sheet for Healthcare Providers: seriousbroker.it  This test is not yet approved or cleared by the [place]  FDA and has been authorized for detection and/or diagnosis of SARS-CoV-2 by FDA under an Emergency Use Authorization (EUA). This EUA will remain in effect (meaning this test can be used) for the duration of the COVID-19 declaration under Section 564(b)(1) of the Act, 21 U.S.C. section 360bbb-3(b)(1), unless the authorization is terminated or revoked.  Performed at [facility], [address] [place], [place] [address]   Blood culture (routine x 2)     Status: None   Collection Time: 05/16/24  9:38 PM   Specimen: BLOOD RIGHT ARM  Result Value  Ref Range Status   Specimen Description BLOOD RIGHT ARM  Final   Special Requests   Final    BOTTLES DRAWN AEROBIC ONLY Blood Culture adequate volume   Culture   Final    NO GROWTH 5 DAYS Performed at [facility], [address], [place], [place] [address]    Report Status 05/21/2024 FINAL  Final  Blood culture (routine x 2)     Status: None   Collection Time: 05/16/24  9:38 PM   Specimen: BLOOD LEFT ARM  Result Value Ref Range Status   Specimen Description BLOOD LEFT ARM  Final   Special Requests   Final    BOTTLES DRAWN AEROBIC AND ANAEROBIC Blood Culture adequate volume   Culture   Final    NO GROWTH 5 DAYS Performed at [facility], [address], [place], [place] [address]    Report Status 05/21/2024 FINAL  Final  Surgical pcr screen     Status: None   Collection Time: 05/21/24  6:22 AM   Specimen: Nasal Mucosa; Nasal Swab  Result Value Ref Range Status   MRSA, PCR NEGATIVE NEGATIVE Final   Staphylococcus aureus NEGATIVE NEGATIVE Final    Comment: (NOTE) The Xpert SA Assay (FDA approved for NASAL specimens in patients 65 years of age and older), is one component of a comprehensive surveillance program. It is not intended to diagnose infection nor to guide or monitor treatment. Performed at [facility], [address] [place], [place] [address]        LOS: 4 days   Donalda Applebaum, MD  [facility]    To contact the attending provider between 7A-7P or the covering provider during after hours 7P-7A, please log into the web site [email or web address] and access using universal [place] password for that web site. If you do not have the password, please call the hospital operator.  05/21/2024, 11:26 AM    "

## 2024-05-21 NOTE — Transfer of Care (Signed)
 Immediate Anesthesia Transfer of Care Note  Patient: Bradley Mcclure  Procedure(s) Performed: POSTERIOR CERVICAL FUSION CERVICAL THREE CERVICAL FOUR,CERVICAL FIVE,CERVICAL SIX/FORAMINOTOMY  Patient Location: PACU  Anesthesia Type:General  Level of Consciousness: awake, alert , and oriented  Airway & Oxygen Therapy: Patient connected to nasal cannula oxygen  Post-op Assessment: Report given to RN and Post -op Vital signs reviewed and stable  Post vital signs: Reviewed and stable  Last Vitals:  Vitals Value Taken Time  BP 146/60 05/21/24 15:48  Temp 36.3 C 05/21/24 15:48  Pulse 84 05/21/24 15:49  Resp 13 05/21/24 15:49  SpO2 93 % 05/21/24 15:49  Vitals shown include unfiled device data.  Last Pain:  Vitals:   05/21/24 1153  TempSrc:   PainSc: 0-No pain      Patients Stated Pain Goal: 0 (05/20/24 2114)  Complications: There were no known notable events for this encounter.

## 2024-05-21 NOTE — Progress Notes (Signed)
 Assessment 64 year old male history of DM2 A1c 5.4, PE on Xarelto , remote cellulitis with sepsis who presents with 3 weeks of progressive cervical myelopathy due to C4-C6 stenosis and OPLL   LOS: 4 days    Plan: C3-6 PCDF   Subjective: No issues overnight  Objective: Vital signs in last 24 hours: Temp:  [97.5 F (36.4 C)-98 F (36.7 C)] 97.5 F (36.4 C) (02/03 0400) Pulse Rate:  [85-105] 87 (02/03 0400) Resp:  [17-24] 20 (02/02 2000) BP: (148-185)/(79-105) 148/86 (02/03 0400) SpO2:  [92 %-95 %] 94 % (02/03 0400)  Intake/Output from previous day: 02/02 0701 - 02/03 0700 In: 900 [P.O.:900] Out: 2800 [Urine:2800] Intake/Output this shift: No intake/output data recorded.  Exam: GCS 4E 5V 40M  Oriented and coherent, but very slow/delayed to answer questions No aphasia or dysarthria PERRL EOMI, conjugate gaze FS TM BUE: 4/5 throughout BLE: 5/5 throughout Subjective paresthesias b/l hands palmar and dorsal side Urinary catheter in place    Lab Results: Recent Labs    05/19/24 0430 05/21/24 0243  WBC 7.4 20.0*  HGB 13.3 16.0  HCT 38.5* 45.5  PLT 127* 162   BMET Recent Labs    05/19/24 0430 05/21/24 0243  NA 138 130*  K 3.5 3.6  CL 99 90*  CO2 32 30  GLUCOSE 148* 202*  BUN 11 9  CREATININE 0.42* 0.46*  CALCIUM  8.8* 9.7       [name] 05/21/2024, 7:41 AM

## 2024-05-21 NOTE — Progress Notes (Signed)
 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 Underwent C3-6 PCDF on 2/3  LOS: 4 days    Plan: Activity and diet as tolerated JP bulb suction Postop films Ok for DVT ppx on 2/5 Ok for Xarelto  on 2/10   Subjective: Pt woke up without issue  Objective: Vital signs in last 24 hours: Temp:  [97.3 F (36.3 C)-98.2 F (36.8 C)] 97.3 F (36.3 C) (02/03 1548) Pulse Rate:  [83-94] 93 (02/03 1615) Resp:  [13-20] 14 (02/03 1615) BP: (127-167)/(60-93) 147/71 (02/03 1615) SpO2:  [92 %-100 %] 93 % (02/03 1615) Weight:  [94 kg] 94 kg (02/03 1147)  Intake/Output from previous day: 02/02 0701 - 02/03 0700 In: 900 [P.O.:900] Out: 2800 [Urine:2800] Intake/Output this shift: Total I/O In: 2350 [I.V.:2250; IV Piggyback:100] Out: 100 [Blood:100]  Exam: Still emerging Bilateral 5/5 hand grip. Wiggles toes briskly JP drain present  Lab Results: Recent Labs    05/19/24 0430 05/21/24 0243  WBC 7.4 20.0*  HGB 13.3 16.0  HCT 38.5* 45.5  PLT 127* 162   BMET Recent Labs    05/19/24 0430 05/21/24 0243  NA 138 130*  K 3.5 3.6  CL 99 90*  CO2 32 30  GLUCOSE 148* 202*  BUN 11 9  CREATININE 0.42* 0.46*  CALCIUM  8.8* 9.7       Dorn JONELLE Mcclure 05/21/2024, 4:26 PM

## 2024-05-21 NOTE — Anesthesia Preprocedure Evaluation (Addendum)
"                                    Anesthesia Evaluation  Patient identified by MRN, date of birth, ID band Patient awake    Reviewed: Allergy & Precautions, NPO status , Patient's Chart, lab work & pertinent test results, reviewed documented beta blocker date and time   History of Anesthesia Complications Negative for: history of anesthetic complications  Airway Mallampati: III  TM Distance: >3 FB Neck ROM: Limited    Dental  (+) Dental Advisory Given   Pulmonary PE   Pulmonary exam normal        Cardiovascular hypertension, Pt. on home beta blockers and Pt. on medications Normal cardiovascular exam   '25 TTE - EF 60 to 65%. There is mild left ventricular hypertrophy of the basal-septal segment. No significant valvular d/o identified.     Neuro/Psych  PSYCHIATRIC DISORDERS Anxiety Depression     Mild cognitive disorder     GI/Hepatic Neg liver ROS,GERD  Medicated and Controlled,,  Endo/Other  diabetes, Type 2, Oral Hypoglycemic Agents, Insulin  Dependent   On GLP-1a   Renal/GU negative Renal ROS     Musculoskeletal negative musculoskeletal ROS (+)    Abdominal   Peds  Hematology  On xarelto     Anesthesia Other Findings Blind   Reproductive/Obstetrics                              Anesthesia Physical Anesthesia Plan  ASA: 3  Anesthesia Plan: General   Post-op Pain Management: Tylenol  PO (pre-op)*   Induction: Intravenous  PONV Risk Score and Plan: 2 and Treatment may vary due to age or medical condition, Ondansetron  and Dexamethasone   Airway Management Planned: Oral ETT and Video Laryngoscope Planned  Additional Equipment: None  Intra-op Plan:   Post-operative Plan: Extubation in OR  Informed Consent: I have reviewed the patients History and Physical, chart, labs and discussed the procedure including the risks, benefits and alternatives for the proposed anesthesia with the patient or authorized  representative who has indicated his/her understanding and acceptance.     Dental advisory given  Plan Discussed with: CRNA and Anesthesiologist  Anesthesia Plan Comments:          Anesthesia Quick Evaluation  "

## 2024-05-21 NOTE — Anesthesia Procedure Notes (Signed)
 Procedure Name: Intubation Date/Time: 05/21/2024 12:42 PM  Performed by: Elby Raelene SAUNDERS, CRNAPre-anesthesia Checklist: Patient identified, Emergency Drugs available, Suction available and Patient being monitored Patient Re-evaluated:Patient Re-evaluated prior to induction Oxygen Delivery Method: Circle System Utilized Preoxygenation: Pre-oxygenation with 100% oxygen Induction Type: IV induction Ventilation: Mask ventilation without difficulty Laryngoscope Size: Glidescope and 3 Grade View: Grade II Tube type: Oral Tube size: 7.5 mm Number of attempts: 1 Airway Equipment and Method: Stylet and Bite block Placement Confirmation: ETT inserted through vocal cords under direct vision, positive ETCO2 and breath sounds checked- equal and bilateral Secured at: 24 cm Tube secured with: Tape Dental Injury: Teeth and Oropharynx as per pre-operative assessment

## 2024-05-22 ENCOUNTER — Inpatient Hospital Stay (HOSPITAL_COMMUNITY)

## 2024-05-22 LAB — COMPREHENSIVE METABOLIC PANEL WITH GFR
ALT: 113 U/L — ABNORMAL HIGH (ref 0–44)
AST: 91 U/L — ABNORMAL HIGH (ref 15–41)
Albumin: 3.4 g/dL — ABNORMAL LOW (ref 3.5–5.0)
Alkaline Phosphatase: 87 U/L (ref 38–126)
Anion gap: 8 (ref 5–15)
BUN: 14 mg/dL (ref 8–23)
CO2: 31 mmol/L (ref 22–32)
Calcium: 9.4 mg/dL (ref 8.9–10.3)
Chloride: 95 mmol/L — ABNORMAL LOW (ref 98–111)
Creatinine, Ser: 0.46 mg/dL — ABNORMAL LOW (ref 0.61–1.24)
GFR, Estimated: >60 mL/min
Glucose, Bld: 198 mg/dL — ABNORMAL HIGH (ref 70–99)
Potassium: 4.3 mmol/L (ref 3.5–5.1)
Sodium: 134 mmol/L — ABNORMAL LOW (ref 135–145)
Total Bilirubin: 1.3 mg/dL — ABNORMAL HIGH (ref 0.0–1.2)
Total Protein: 6.3 g/dL — ABNORMAL LOW (ref 6.5–8.1)

## 2024-05-22 LAB — CBC
HCT: 39.2 % (ref 39.0–52.0)
Hemoglobin: 13.7 g/dL (ref 13.0–17.0)
MCH: 31.6 pg (ref 26.0–34.0)
MCHC: 34.9 g/dL (ref 30.0–36.0)
MCV: 90.5 fL (ref 80.0–100.0)
Platelets: 159 10*3/uL (ref 150–400)
RBC: 4.33 MIL/uL (ref 4.22–5.81)
RDW: 13.4 % (ref 11.5–15.5)
WBC: 16 10*3/uL — ABNORMAL HIGH (ref 4.0–10.5)
nRBC: 0 % (ref 0.0–0.2)

## 2024-05-22 LAB — GLUCOSE, CAPILLARY
Glucose-Capillary: 137 mg/dL — ABNORMAL HIGH (ref 70–99)
Glucose-Capillary: 132 mg/dL — ABNORMAL HIGH (ref 70–99)
Glucose-Capillary: 193 mg/dL — ABNORMAL HIGH (ref 70–99)

## 2024-05-22 NOTE — Evaluation (Signed)
 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  Evaluated by neurosurgery-recommendations were to hold anticoagulation and proceed with cervical decompression on 2/3. C3-6 cervical decompression performed 2/3. PMH significant for esophageal dysphagia (? Prior cricopharyngeal narrowing), cognitive dysphagia, PE on anticoagulation, diabetes type 2, hypertension, hyperlipidemia, NAFLD, depression, cognitive impairment, GERD.   Clinical Impression  Pt presents s/p C3-6 decompression and fusion. Pt re-assessed and no significant differences in functional mobility noted, however, pt demonstrates decreased alertness and response to questions. Pt demonstrates poor safety awareness throughout and requires consistent cues to encourage safety with mobility. Pt requires modAx2 for bed mobility and demonstrates partial carryover for log roll technique. Pt also requires modAx2 for STS and transfer to recliner. Posterior and R trunk lean noted throughout and requires pillows for appropriate posture in chair. Cervical precautions reviewed with patient and was unable to repeat back. Pt would benefit from continued PT services focused on bed mobility, strength, balance, transfers, and gait when appropriate. Pt not safe to return home at this time as he lives alone. <3hrs rehab still recommended.         If plan is discharge home, recommend the following: A lot of help with bathing/dressing/bathroom;A lot of help with walking and/or transfers;Assistance with cooking/housework;Assist for transportation;Direct supervision/assist for medications management;Direct supervision/assist for  financial management;Supervision due to cognitive status;Help with stairs or ramp for entrance   Can travel by private vehicle   No (Cognition, post op cervical decompression)    Equipment Recommendations Rolling walker (2 wheels);BSC/3in1  Recommendations for Other Services       Functional Status Assessment Patient has had a recent decline in their functional status and demonstrates the ability to make significant improvements in function in a reasonable and predictable amount of time.     Precautions / Restrictions Precautions Precautions: Fall;Cervical Precaution Booklet Issued: No Recall of Precautions/Restrictions: Impaired Precaution/Restrictions Comments: Aspen collar available as needed for  comfort, no strict c-spine precautions but would benefit from following BLTs. Required Braces or Orthoses: Cervical Brace Cervical Brace: Hard collar;For comfort Restrictions Weight Bearing Restrictions Per Provider Order: No      Mobility  Bed Mobility Overal bed mobility: Needs Assistance Bed Mobility: Sidelying to Sit, Sit to Sidelying   Sidelying to sit: Mod assist, +2 for physical assistance, +2 for safety/equipment     Sit to sidelying: Mod assist, +2 for physical assistance, +2 for safety/equipment General bed mobility comments: Mod A +2 for log roll to sit EOB on L side. Increased assistance for managing trunk elevation, assist needed for BLE, difficulty sequencing.    Transfers Overall transfer level: Needs assistance Equipment used: Rolling walker (2 wheels) Transfers: Sit to/from Stand, Bed to chair/wheelchair/BSC Sit to Stand: Mod assist, +2 physical assistance, +2 safety/equipment   Step pivot transfers: Mod assist, +2 physical assistance, +2 safety/equipment       General transfer comment: Mod A +2 for functional transfers, Posterior lean still present in standing and dense cues required to improve posture and sequence side steps to R and turn towards  recliner. Attempts to start sitting prematurely. Increased assist to manage RW. Poor coordination of movement.    Ambulation/Gait                  Stairs  Wheelchair Mobility     Tilt Bed    Modified Rankin (Stroke Patients Only)       Balance Overall balance assessment: Needs assistance Sitting-balance support: Feet supported, Bilateral upper extremity supported Sitting balance-Leahy Scale: Poor Sitting balance - Comments: CGA to Mod A to maintain sitting balance EOB, posterior and R lean persists, poor righting reactions but does correct with cues. Improves with hands placed on knees. Postural control: Posterior lean, Right lateral lean Standing balance support: Bilateral upper extremity supported, During functional activity, Reliant on assistive device for balance Standing balance-Leahy Scale: Poor Standing balance comment: Dependent on RW and external support                             Pertinent Vitals/Pain Pain Assessment Pain Assessment: Faces Faces Pain Scale: Hurts a little bit Pain Location: Neck Pain Descriptors / Indicators: Aching, Sore Pain Intervention(s): Limited activity within patient's tolerance, Repositioned, Monitored during session    Home Living Family/patient expects to be discharged to:: Private residence Living Arrangements: Alone Available Help at Discharge: Other (Comment) (None) Type of Home: House (condo) Home Access: Level entry       Home Layout: One level Home Equipment: Agricultural Consultant (2 wheels)      Prior Function Prior Level of Function : History of Falls (last six months);Independent/Modified Independent             Mobility Comments: Reports independent but with significant history of falls (5 in the last month). ADLs Comments: Reports independent     Extremity/Trunk Assessment   Upper Extremity Assessment Upper Extremity Assessment: Defer to OT evaluation    Lower Extremity  Assessment Lower Extremity Assessment: Generalized weakness RLE Deficits / Details: MMT 2+/5 knee ext, 3+/5 hip flex, some difficulty following cues to push against resistance, no significant changes noted in LE strength RLE Sensation: WNL LLE Deficits / Details: MMT 2+/5 knee ext, 3+/5 hip flex, some difficulty following cues to push against resistance, no significant changes noted in LE strength. LLE Sensation: WNL    Cervical / Trunk Assessment Cervical / Trunk Assessment: Normal  Communication   Communication Communication: No apparent difficulties Factors Affecting Communication: Difficulty expressing self;Reduced clarity of speech    Cognition Arousal: Lethargic Behavior During Therapy: Flat affect   PT - Cognitive impairments: Awareness, Attention, Problem solving, Sequencing, Safety/Judgement                       PT - Cognition Comments: Pt with decreased response to questions, takes increased time if answering at all. Requires multimodal cues for safe mobility. Poor safety awareness. Following commands: Impaired Following commands impaired: Follows one step commands inconsistently, Follows one step commands with increased time     Cueing Cueing Techniques: Verbal cues, Visual cues, Tactile cues     General Comments General comments (skin integrity, edema, etc.): VSS throughout. Incision site C,D,I. Brace donned for mobility for comfort per pt request. No additional skin abnormalities noted.    Exercises     Assessment/Plan    PT Assessment Patient needs continued PT services  PT Problem List Decreased strength;Decreased knowledge of use of DME;Decreased activity tolerance;Decreased safety awareness;Decreased balance;Decreased knowledge of precautions;Decreased mobility;Decreased coordination;Decreased skin integrity       PT Treatment Interventions DME instruction;Gait training;Neuromuscular re-education;Patient/family education;Functional mobility  training;Therapeutic activities;Therapeutic exercise;Balance training    PT Goals (Current goals can be found in the Care Plan section)  Acute Rehab PT  Goals Patient Stated Goal: Patient's goal is to improve balance and return home. PT Goal Formulation: With patient Time For Goal Achievement: 06/05/24 Potential to Achieve Goals: Good    Frequency Min 2X/week     Co-evaluation PT/OT/SLP Co-Evaluation/Treatment: Yes Reason for Co-Treatment: Complexity of the patient's impairments (multi-system involvement);To address functional/ADL transfers PT goals addressed during session: Mobility/safety with mobility;Balance;Proper use of DME;Strengthening/ROM OT goals addressed during session: ADL's and self-care;Proper use of Adaptive equipment and DME;Strengthening/ROM       AM-PAC PT 6 Clicks Mobility  Outcome Measure Help needed turning from your back to your side while in a flat bed without using bedrails?: A Lot Help needed moving from lying on your back to sitting on the side of a flat bed without using bedrails?: A Lot Help needed moving to and from a bed to a chair (including a wheelchair)?: A Lot Help needed standing up from a chair using your arms (e.g., wheelchair or bedside chair)?: A Lot Help needed to walk in hospital room?: Total Help needed climbing 3-5 steps with a railing? : Total 6 Click Score: 10    End of Session Equipment Utilized During Treatment: Gait belt Activity Tolerance: Patient limited by lethargy;Patient limited by fatigue Patient left: with call bell/phone within reach;in chair;with chair alarm set Nurse Communication: Mobility status;Precautions PT Visit Diagnosis: Unsteadiness on feet (R26.81);Other abnormalities of gait and mobility (R26.89);Difficulty in walking, not elsewhere classified (R26.2);Repeated falls (R29.6);Muscle weakness (generalized) (M62.81);History of falling (Z91.81)    Time: 9082-9059 PT Time Calculation (min) (ACUTE ONLY): 23  min   Charges:   PT Evaluation $PT Re-evaluation: 1 Re-eval   PT General Charges $$ ACUTE PT VISIT: 1 Visit         Sabra Morel, PT, DPT  Acute Rehabilitation Services         Office: [phone]     Sabra MARLA Morel 05/22/2024, 1:24 PM

## 2024-05-22 NOTE — Plan of Care (Signed)
°  Problem: Skin Integrity: Goal: Risk for impaired skin integrity will decrease Outcome: Progressing   Problem: Tissue Perfusion: Goal: Adequacy of tissue perfusion will improve Outcome: Progressing   Problem: Clinical Measurements: Goal: Will remain free from infection Outcome: Progressing Goal: Diagnostic test results will improve Outcome: Progressing Goal: Respiratory complications will improve Outcome: Progressing

## 2024-05-22 NOTE — Progress Notes (Signed)
 RE: Bradley Mcclure Date of Birth: [birth year] Date: 05/22/24  Please be advised that the above-named patient will require a short-term nursing home stay - anticipated 30 days or less for rehabilitation and strengthening.  The plan is for return home.

## 2024-05-22 NOTE — Anesthesia Postprocedure Evaluation (Signed)
"   Anesthesia Post Note  Patient: Bradley Mcclure  Procedure(s) Performed: POSTERIOR CERVICAL FUSION CERVICAL THREE CERVICAL FOUR,CERVICAL FIVE,CERVICAL SIX/FORAMINOTOMY     Patient location during evaluation: PACU Anesthesia Type: General Level of consciousness: awake and alert Pain management: pain level controlled Vital Signs Assessment: post-procedure vital signs reviewed and stable Respiratory status: spontaneous breathing, nonlabored ventilation, respiratory function stable and patient connected to nasal cannula oxygen Cardiovascular status: blood pressure returned to baseline and stable Postop Assessment: no apparent nausea or vomiting Anesthetic complications: no   There were no known notable events for this encounter.  Last Vitals:  Vitals:   05/22/24 1600 05/22/24 1648  BP: 115/63   Pulse: 92 92  Resp: 16 16  Temp: 37.1 C 36.9 C  SpO2: 90% 92%    Last Pain:  Vitals:   05/22/24 1648  TempSrc: Oral  PainSc:                  Thom [name]      "

## 2024-05-22 NOTE — TOC Progression Note (Signed)
 Transition of Care [facility]) - Progression Note    Patient Details  Name: Bradley Mcclure MRN: [id number] Date of Birth: Aug 09, 1960  Transition of Care [facility] CM/SW Contact  Inocente GORMAN Kindle, LCSW Phone Number: 05/22/2024, 4:14 PM  Clinical Narrative:    CSW received call from Arch Ada with [facility] [phone]) who stated she has an open case on the patient. CSW provided update that patient is currently agreeable to SNF rehab. She requested to be updated with final discharge plans.    Expected Discharge Plan:  (TBD) Barriers to Discharge: Continued Medical Work up               Expected Discharge Plan and Services In-house Referral: Clinical Social Work Discharge Planning Services: CM Consult   Living arrangements for the past 2 months: Apartment                                       Social Drivers of Health (SDOH) Interventions SDOH Screenings   Food Insecurity: No Food Insecurity (05/17/2024)  Housing: Low Risk (05/17/2024)  Transportation Needs: No Transportation Needs (05/17/2024)  Utilities: Not At Risk (05/17/2024)  Tobacco Use: Low Risk (05/21/2024)    Readmission Risk Interventions     No data to display

## 2024-05-22 NOTE — Progress Notes (Signed)
 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 Underwent C3-6 PCDF on 2/3  LOS: 5 days    Plan: Activity and diet as tolerated Collar for comfort only. Does not need strict Cspine precautions JP bulb suction - 125cc, remain today Postop films pending Ok for DVT ppx on 2/5 Ok for Xarelto  on 2/10   Subjective: Neuro stable  Objective: Vital signs in last 24 hours: Temp:  [97.3 F (36.3 C)-98.9 F (37.2 C)] 97.9 F (36.6 C) (02/04 0400) Pulse Rate:  [83-95] 90 (02/04 0400) Resp:  [11-18] 17 (02/04 0400) BP: (108-148)/(60-85) 127/84 (02/04 0400) SpO2:  [92 %-100 %] 99 % (02/04 0400) Weight:  [94 kg] 94 kg (02/03 1147)  Intake/Output from previous day: 02/03 0701 - 02/04 0700 In: 2350 [I.V.:2250; IV Piggyback:100] Out: 1125 [Urine:900; Drains:125; Blood:100] Intake/Output this shift: No intake/output data recorded.  Exam: Awake, alert, oriented Delayed initiation of speech and movement, but coherent and follows commands Honeycomb dressing dry JP in place RUE: 4/5 deltoid, 5/5 bicep, 5/5 wrist extension, 5/5 tricep, 4/5 grip LUE: 5/5 deltoid, 5/5 bicep, 5/5 wrist extension, 5/5 tricep, 4/5 grip RLE: 5/5 IP LLE: 5/5 IP    Lab Results: Recent Labs    05/21/24 0243 05/22/24 0231  WBC 20.0* 16.0*  HGB 16.0 13.7  HCT 45.5 39.2  PLT 162 159   BMET Recent Labs    05/21/24 0243 05/22/24 0231  NA 130* 134*  K 3.6 4.3  CL 90* 95*  CO2 30 31  GLUCOSE 202* 198*  BUN 9 14  CREATININE 0.46* 0.46*  CALCIUM  9.7 9.4       [name] 05/22/2024, 7:27 AM

## 2024-05-22 NOTE — Progress Notes (Signed)
 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  Evaluated by neurosurgery-recommendations were to hold anticoagulation and proceed with cervical decompression on 2/3. C3-6 cervical decompression performed 2/3. PMH significant for esophageal dysphagia (? Prior cricopharyngeal narrowing), cognitive dysphagia, PE on anticoagulation, diabetes type 2, hypertension, hyperlipidemia, NAFLD, depression, cognitive impairment, GERD.   OT comments  Pt seen in conjunction with PT, now s/p C3-6 decompression and fusion. Pt continues to demonstrate flat affect, currently with limitations in executive functioning skills and ability to follow commands. Pt continues to require Mod A +2 for functional mobility and Max A for ADL task engagement. Cervical precautions reviewed with patient and was unable to repeat back. OT to continue to follow Pt acutely to facilitate progress towards goals. Continue per POC.       If plan is discharge home, recommend the following:  Two people to help with walking and/or transfers;A lot of help with bathing/dressing/bathroom;Assistance with cooking/housework;Assist for transportation;Help with stairs or ramp for entrance   Equipment Recommendations  Other (comment) (defer)    Recommendations for Other Services      Precautions / Restrictions Precautions Precautions: Fall;Cervical Precaution Booklet Issued: No Recall of Precautions/Restrictions: Impaired Precaution/Restrictions Comments: Aspen collar available as needed for  comfort, no strict c-spine precautions but would benefit from following BLTs. Required Braces or Orthoses: Cervical  Brace Cervical Brace: Hard collar;For comfort Restrictions Weight Bearing Restrictions Per Provider Order: No       Mobility Bed Mobility Overal bed mobility: Needs Assistance Bed Mobility: Sidelying to Sit, Sit to Sidelying   Sidelying to sit: Mod assist, +2 for physical assistance, +2 for safety/equipment     Sit to sidelying: Mod assist, +2 for physical assistance, +2 for safety/equipment General bed mobility comments: Mod A +2 for log roll to sit EOB on L side. Increased assistance for managing trunk elevation, assist needed for BLE, difficulty sequencing.    Transfers Overall transfer level: Needs assistance Equipment used: Rolling walker (2 wheels) Transfers: Sit to/from Stand, Bed to chair/wheelchair/BSC Sit to Stand: Mod assist, +2 physical assistance, +2 safety/equipment     Step pivot transfers: Mod assist, +2 physical assistance, +2 safety/equipment     General transfer comment: Mod A +2 for functional transfers, Posterior lean still present in standing and dense cues required to improve posture and sequence side steps to R and turn towards recliner. Attempts to start sitting prematurely. Increased assist to manage RW. Poor coordination of movement.     Balance Overall balance assessment: Needs assistance Sitting-balance support: Feet supported, Bilateral upper extremity supported Sitting balance-Leahy Scale: Poor Sitting balance - Comments: CGA to Mod A to maintain sitting balance EOB, posterior and R lean persists, poor righting reactions but does correct with cues. Improves with hands placed on knees. Postural control: Posterior lean, Right lateral lean Standing balance support: Bilateral upper extremity supported, During functional activity, Reliant on assistive device for balance Standing balance-Leahy Scale: Poor Standing balance comment: Dependent on RW and external support                           ADL either performed or assessed with clinical  judgement   ADL Overall ADL's : Needs assistance/impaired  Upper Body Dressing : Moderate assistance Upper Body Dressing Details (indicate cue type and reason): Mod A UB dress, total A to don cervical brace Lower Body Dressing: Maximal assistance                      Extremity/Trunk Assessment Upper Extremity Assessment Upper Extremity Assessment: Generalized weakness   Lower Extremity Assessment Lower Extremity Assessment: Generalized weakness RLE Deficits / Details: MMT 2+/5 knee ext, 3+/5 hip flex, some difficulty following cues to push against resistance, no significant changes noted in LE strength RLE Sensation: WNL LLE Deficits / Details: MMT 2+/5 knee ext, 3+/5 hip flex, some difficulty following cues to push against resistance, no significant changes noted in LE strength. LLE Sensation: WNL   Cervical / Trunk Assessment Cervical / Trunk Assessment: Normal    Vision       Perception     Praxis     Communication Communication Communication: Impaired Factors Affecting Communication: Difficulty expressing self;Reduced clarity of speech   Cognition Arousal: Lethargic Behavior During Therapy: Flat affect Cognition: Cognition impaired, Difficult to assess Difficult to assess due to:  (flat affect and response to instruction)         Executive functioning impairment (select all impairments): Initiation, Organization, Sequencing, Reasoning, Problem solving OT - Cognition Comments: Pt with flat affect, presents with decreased response to instruction. Repeated cues required for engagement in tasks. Unsure if Pt is currently at baseline                 Following commands: Impaired Following commands impaired: Follows one step commands inconsistently, Follows one step commands with increased time      Cueing   Cueing Techniques: Verbal cues, Visual cues, Tactile cues  Exercises      Shoulder Instructions       General Comments  VSS throughout session. Incision site C,D,I. Brace donned for mobility for comfort per pt request. No additional skin abnormalities noted.    Pertinent Vitals/ Pain       Pain Assessment Pain Assessment: Faces Faces Pain Scale: Hurts a little bit Pain Location: Neck Pain Descriptors / Indicators: Aching, Sore Pain Intervention(s): Limited activity within patient's tolerance, Monitored during session, Repositioned  Home Living Family/patient expects to be discharged to:: Private residence Living Arrangements: Alone Available Help at Discharge: Other (Comment) (None) Type of Home: House (condo) Home Access: Level entry     Home Layout: One level     Bathroom Shower/Tub: Chief Strategy Officer: Standard     Home Equipment: Agricultural Consultant (2 wheels)          Prior Functioning/Environment              Frequency  Min 2X/week        Progress Toward Goals  OT Goals(current goals can now be found in the care plan section)  Progress towards OT goals: Progressing toward goals  Acute Rehab OT Goals Patient Stated Goal: none stated this date OT Goal Formulation: With patient Time For Goal Achievement: 06/03/24 Potential to Achieve Goals: Good ADL Goals Pt Will Perform Upper Body Dressing: with set-up;sitting Pt Will Perform Lower Body Dressing: with contact guard assist;with adaptive equipment;sitting/lateral leans Pt Will Transfer to Toilet: with contact guard assist;bedside commode;stand pivot transfer Additional ADL Goal #1: Pt will engage in bed mobility with supervision as a precursor to ADL tasks OOB.  Plan      Co-evaluation    PT/OT/SLP Co-Evaluation/Treatment: Yes Reason for Co-Treatment: Complexity  of the patient's impairments (multi-system involvement);To address functional/ADL transfers PT goals addressed during session: Mobility/safety with mobility;Balance;Proper use of DME;Strengthening/ROM OT goals addressed during session: ADL's and  self-care;Proper use of Adaptive equipment and DME;Strengthening/ROM      AM-PAC OT 6 Clicks Daily Activity     Outcome Measure   Help from another person eating meals?: A Little Help from another person taking care of personal grooming?: A Little Help from another person toileting, which includes using toliet, bedpan, or urinal?: Total Help from another person bathing (including washing, rinsing, drying)?: A Lot Help from another person to put on and taking off regular upper body clothing?: A Lot Help from another person to put on and taking off regular lower body clothing?: A Lot 6 Click Score: 13    End of Session Equipment Utilized During Treatment: Gait belt;Rolling walker (2 wheels);Cervical collar  OT Visit Diagnosis: Unsteadiness on feet (R26.81);History of falling (Z91.81);Muscle weakness (generalized) (M62.81);Pain   Activity Tolerance Patient tolerated treatment well   Patient Left in chair;with call bell/phone within reach;with chair alarm set   Nurse Communication Mobility status        Time: 9082-9058 OT Time Calculation (min): 24 min  Charges: OT General Charges $OT Visit: 1 Visit OT Treatments $Therapeutic Activity: 8-22 mins  Maurilio CROME, OTR/L.  [facility] Acute Rehabilitation  Office: [phone]   Maurilio PARAS Teren Franckowiak 05/22/2024, 1:41 PM

## 2024-05-22 NOTE — Plan of Care (Signed)

## 2024-05-22 NOTE — NC FL2 (Signed)
 " [place]  MEDICAID FL2 LEVEL OF CARE FORM     IDENTIFICATION  Patient Name: Bradley Mcclure Birthdate: 1960-04-25 Sex: male Admission Date (Current Location): 05/16/2024  [facility] and [place] Number:  Producer, Television/film/video and Address:  [facility], [address], [place], [place] [address]      Provider Number: [id number]  Attending Physician Name and Address:  Tobie Yetta CHRISTELLA, MD  Relative Name and Phone Number:       Current Level of Care: Hospital Recommended Level of Care: Skilled Nursing Facility Prior Approval Number:    Date Approved/Denied:   PASRR Number: pending  Discharge Plan: SNF    Current Diagnoses: Patient Active Problem List   Diagnosis Date Noted   Spinal stenosis 05/17/2024   Cervical stenosis of spine 05/17/2024   Long term current use of anticoagulant therapy 09/18/2023   Major depressive disorder, recurrent severe without psychotic features (HCC) 09/18/2023   Mild cognitive impairment of uncertain or unknown etiology 09/18/2023   Acute hypoxemic respiratory failure (HCC) 09/18/2023   Aspiration pneumonitis (HCC) 08/30/2023   Aspiration into airway 08/24/2023   SIRS (systemic inflammatory response syndrome) (HCC) 08/24/2023   Oropharyngeal dysphagia 08/24/2023   Acute hypoxic respiratory failure (HCC) 08/24/2023   Pneumonia 09/06/2022   Hyperlipidemia 09/06/2022   Generalized anxiety disorder 09/06/2022   Major depressive disorder 09/06/2022   Type 2 diabetes mellitus with diabetic neuropathy, unspecified (HCC) 09/06/2022   History of retinal detachment 09/06/2022   Sepsis due to Staphylococcus aureus (HCC) 03/01/2016   Cellulitis of left lower extremity    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   Cellulitis and abscess 12/24/2015   Balanitis 12/24/2015   Uncontrolled type 2 diabetes mellitus with hyperglycemia, with long-term current use of insulin  (HCC)    Bacteremia    Morbid obesity due to excess calories (HCC)     Cellulitis of abdominal wall 11/11/2015   Sepsis (HCC) 11/11/2015   Rash 11/11/2015   Hypertension    Diabetes mellitus without complication (HCC)    Candida-induced panniculitis    GERD (gastroesophageal reflux disease) 04/19/2015   Pulmonary embolism (HCC) 03/21/2013   Non-alcoholic fatty liver disease 04/19/2011    Orientation RESPIRATION BLADDER Height & Weight     Self, Time, Situation, Place  O2 (2L nasal cannula) Incontinent, External catheter Weight: 207 lb 3.7 oz (94 kg) Height:  6' 1 (185.4 cm)  BEHAVIORAL SYMPTOMS/MOOD NEUROLOGICAL BOWEL NUTRITION STATUS      Continent Diet (See dc summary)  AMBULATORY STATUS COMMUNICATION OF NEEDS Skin   Extensive Assist Verbally Surgical wounds (Closed incision on neck)                       Personal Care Assistance Level of Assistance  Feeding, Bathing, Dressing Bathing Assistance: Maximum assistance Feeding assistance: Limited assistance Dressing Assistance: Limited assistance     Functional Limitations Info  Sight Sight Info: Impaired (glasses)        SPECIAL CARE FACTORS FREQUENCY  PT (By licensed PT), OT (By licensed OT)     PT Frequency: 5x/week OT Frequency: 5x/week            Contractures Contractures Info: Not present    Additional Factors Info  Code Status, Allergies Code Status Info: Full Allergies Info: Empagliflozin            Current Medications (05/22/2024):  This is the current hospital active medication list Current Facility-Administered Medications  Medication Dose Route Frequency Provider Last Rate Last Admin  acetaminophen  (TYLENOL ) tablet 650 mg  650 mg Oral Q6H PRN Darnella Dorn SAUNDERS, MD       Or   acetaminophen  (TYLENOL ) suppository 650 mg  650 mg Rectal Q6H PRN Darnella Dorn SAUNDERS, MD       albuterol  (PROVENTIL ) (2.5 MG/3ML) 0.083% nebulizer solution 2.5 mg  2.5 mg Nebulization Q2H PRN Darnella Dorn SAUNDERS, MD       ARIPiprazole  (ABILIFY ) tablet 2 mg  2 mg Oral Daily Garst, Jonathan R,  MD   2 mg at 05/22/24 9042   bisacodyl  (DULCOLAX) suppository 10 mg  10 mg Rectal Daily PRN Darnella Dorn SAUNDERS, MD       cyclobenzaprine  (FLEXERIL ) tablet 10 mg  10 mg Oral TID Garst, Jonathan R, MD   10 mg at 05/22/24 9043   FLUoxetine  (PROZAC ) capsule 20 mg  20 mg Oral BID PRN Garst, Jonathan R, MD   20 mg at 05/20/24 1349   hydrALAZINE  (APRESOLINE ) injection 10 mg  10 mg Intravenous Q6H PRN Darnella Dorn SAUNDERS, MD       insulin  aspart (novoLOG ) injection 0-9 Units  0-9 Units Subcutaneous TID WC Garst, Jonathan R, MD   1 Units at 05/22/24 1326   labetalol  (NORMODYNE ) injection 10 mg  10 mg Intravenous Q2H PRN Garst, Jonathan R, MD   10 mg at 05/20/24 1738   metoprolol  tartrate (LOPRESSOR ) tablet 25 mg  25 mg Oral BID Garst, Jonathan R, MD   25 mg at 05/22/24 1002   morphine  (PF) 2 MG/ML injection 2 mg  2 mg Intravenous Q2H PRN Darnella Dorn SAUNDERS, MD       oxyCODONE  (Oxy IR/ROXICODONE ) immediate release tablet 5 mg  5 mg Oral Q4H PRN Garst, Jonathan R, MD   5 mg at 05/22/24 1028   pantoprazole  (PROTONIX ) EC tablet 40 mg  40 mg Oral Daily Garst, Jonathan R, MD   40 mg at 05/22/24 9043   polyethylene glycol (MIRALAX  / GLYCOLAX ) packet 17 g  17 g Oral BID Garst, Jonathan R, MD   17 g at 05/22/24 0957   rosuvastatin  (CRESTOR ) tablet 10 mg  10 mg Oral Daily Garst, Jonathan R, MD   10 mg at 05/22/24 0957   senna (SENOKOT) tablet 17.2 mg  2 tablet Oral Daily Darnella Dorn SAUNDERS, MD   17.2 mg at 05/22/24 9042     Discharge Medications: Please see discharge summary for a list of discharge medications.  Relevant Imaging Results:  Relevant Lab Results:   Additional Information SSN    [id number] . Using [facility]  Inocente RAMAN Angelicia Lessner, LCSW     "

## 2024-05-22 NOTE — Progress Notes (Signed)
 [facility] Progress Note Patient: Bradley Mcclure FMW:[id number] DOB: Aug 16, 1960  DOA: 05/16/2024 DOS: the patient was seen and examined on 05/22/2024  Brief Summary: Patient is a 64 y.o.  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  Evaluated by neurosurgery-recommendations were to hold anticoagulation and proceed with cervical decompression on 2/3.   Significant events: 1/30>> admit to [facility]. 2/3>> s/p C3 C6 laminectomy and arthrodesis.  Dr. Dorn Glade.   Significant studies: 1/29>> MRI brain: No acute intracranial abnormality 1/29> MRI C-spine: New inflammation in the upper/mid cervical prevertebral soft tissue-severe spinal stenosis with cord flattening at C4-C6, moderate right-sided spinal stenosis with mild cord flattening C5-C6. 1/29>> MRI T-spine: No evidence of infection or compressive stenosis 1/29>> MRI L-spine: No evidence of lumbar spine infection-no spinal stenosis. 1/29>> CT abdomen/pelvis: No acute findings.   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 Neurosurgery consulted. Underwent decompressive surgery on 2/3. Management per neurosurgery. Still has JP bulb drain. From neurosurgery-okay to use DVT prophylaxis in 2/5, okay for returning to Xarelto  on 2/10.  Collar for comfort only.  Activity as tolerated. PT OT consulted.  SNF recommended.  Monitor.  Leukocytosis Likely reactive No evidence of infection Had resolved but again has leukocytosis this morning-without any symptoms or  suggestion of infection Continue to monitor off antibiotics   Constipation Last BM 2/1 MiraLAX /senna As needed Dulcolax suppository   History of PE Off rivaroxaban -as scheduled for C-spine decompressive surgery 2/3.   Chronic HFpEF Euvolemic   HTN BP stable on metoprolol  Per prior note-metoprolol  discontinued by cardiology during his most recent outpatient visit-however since BP was elevated-he has been restarted on it during this hospitalization.   HLD Continue statin.   DM-2 (A1c 5.3 on 1/30) well-controlled.  With ong-term insulin  use.  Without complication. CBG stable SSI while inpatient.  Holding metformin , semaglutide, Lantus  Resume metformin  on discharge.  On semaglutide as well.   Mood disorder Seems stable Fluoxetine /Abilify   LFT elevation. Postoperatively his bilirubin as well as AST ALT remains elevated. This is likely in the setting of hemodynamic injury. Monitor LFT for now.    Code Status: Full Code    DVT Prophylaxis: SCDs.   Data review I have Reviewed nursing notes, Vitals, and Lab results. Since last encounter, pertinent lab results CBC and BMP   . I have ordered test including CBC and BMP  .     Latest Ref Rng & Units 05/22/2024    2:31 AM 05/21/2024    2:43 AM 05/19/2024    4:30 AM  CBC  WBC 4.0 - 10.5 K/uL 16.0  20.0  7.4   Hemoglobin 13.0 - 17.0 g/dL 86.2  83.9  86.6   Hematocrit 39.0 - 52.0 % 39.2  45.5  38.5   Platelets 150 - 400 K/uL 159  162  127     Lab Results  Component Value Date   NA 134 (L) 05/22/2024   K 4.3 05/22/2024   CL 95 (L) 05/22/2024   CO2 31 05/22/2024     Physical exam. Vitals:   05/22/24 0000 05/22/24 0400 05/22/24 1002 05/22/24 1200  BP: 115/64 127/84 (!) 146/76   Pulse: 84  90 (!) 112 98  Resp: 16 17    Temp: 98 F (36.7 C) 97.9 F (36.6 C)  98.3 F (36.8 C)  TempSrc: Axillary Axillary  Oral  SpO2: 99% 99%    Weight:      Height:      Clear to auscultation. S1-S2 present. Bilateral equal  strength. Some tremors noted. No edema. No subjective numbness.  Subjective: Pain well-controlled.  No nausea no vomiting.  Family Communication: No one at bedside.  Disposition Plan: Status is: Inpatient Remains inpatient appropriate because: Monitor for postop recovery.   Planned Discharge Destination:Skilled nursing facility Diet: Diet Order             Diet heart healthy/carb modified Room service appropriate? Yes; Fluid consistency: Thin  Diet effective now                   MEDICATIONS: Scheduled Meds:  ARIPiprazole   2 mg Oral Daily   cyclobenzaprine   10 mg Oral TID   insulin  aspart  0-9 Units Subcutaneous TID WC   metoprolol  tartrate  25 mg Oral BID   pantoprazole   40 mg Oral Daily   polyethylene glycol  17 g Oral BID   rosuvastatin   10 mg Oral Daily   senna  2 tablet Oral Daily   Continuous Infusions: PRN Meds:.acetaminophen  **OR** acetaminophen , albuterol , bisacodyl , FLUoxetine , hydrALAZINE , labetalol , morphine  injection, oxyCODONE   Author: Yetta Blanch, MD  [facility] 05/22/2024  3:47 PM Between 7PM-7AM, please contact night-coverage, check [email or web address] for on call.

## 2024-05-22 NOTE — Discharge Instructions (Addendum)
 Wound Care No dressing is required over your incision. Keep clean and dry. You can shower. Let water run over incision. Do not scrub. Pat dry Do not put any creams, lotions, or ointments on incision. Do not submerge your incision for the first 6 weeks (pool, ocean, etc)  Activity Walk each and every day, increasing distance each day. No lifting greater than 10 lbs.  Avoid excessive back/neck motion.  Diet Resume your normal diet.  Call Your Doctor If Any of These Occur Redness, drainage, or swelling at the wound.  Temperature greater than 101 degrees. Severe pain not relieved by pain medication. Incision starts to come apart.  You can restart your home Eliquis on 05/28/24  Follow Up Appt Call [phone] today for appointment in 6 weeks if you don't already have one or for any problems.  If you have any hardware placed in your spine, you will need an x-ray before your appointment.

## 2024-05-22 NOTE — TOC Progression Note (Signed)
 Transition of Care [facility]) - Progression Note    Patient Details  Name: Bradley Mcclure MRN: [id number] Date of Birth: 26-Jun-1960  [facility] CM/SW Contact  Landry DELENA Senters, RN Phone Number: 05/22/2024, 12:51 PM  Clinical Narrative:     CM spoke with patient today regarding SNF rec per therapy. Patient is agreeable to SNF at discharge and prefers [facility]. ICM will continue to follow.  Expected Discharge Plan:  (TBD) Barriers to Discharge: Continued Medical Work up               Expected Discharge Plan and Services In-house Referral: Clinical Social Work Discharge Planning Services: CM Consult   Living arrangements for the past 2 months: Apartment                                       Social Drivers of Health (SDOH) Interventions SDOH Screenings   Food Insecurity: No Food Insecurity (05/17/2024)  Housing: Low Risk (05/17/2024)  Transportation Needs: No Transportation Needs (05/17/2024)  Utilities: Not At Risk (05/17/2024)  Tobacco Use: Low Risk (05/21/2024)    Readmission Risk Interventions     No data to display

## 2024-05-23 LAB — CBC WITH DIFFERENTIAL/PLATELET
Abs Immature Granulocytes: 0.08 10*3/uL — ABNORMAL HIGH (ref 0.00–0.07)
Basophils Absolute: 0 10*3/uL (ref 0.0–0.1)
Basophils Relative: 0 %
Eosinophils Absolute: 0.1 10*3/uL (ref 0.0–0.5)
Eosinophils Relative: 1 %
HCT: 39.8 % (ref 39.0–52.0)
Hemoglobin: 13.7 g/dL (ref 13.0–17.0)
Immature Granulocytes: 1 %
Lymphocytes Relative: 18 %
Lymphs Abs: 2.2 10*3/uL (ref 0.7–4.0)
MCH: 31.7 pg (ref 26.0–34.0)
MCHC: 34.4 g/dL (ref 30.0–36.0)
MCV: 92.1 fL (ref 80.0–100.0)
Monocytes Absolute: 1.3 10*3/uL — ABNORMAL HIGH (ref 0.1–1.0)
Monocytes Relative: 11 %
Neutro Abs: 8.3 10*3/uL — ABNORMAL HIGH (ref 1.7–7.7)
Neutrophils Relative %: 69 %
Platelets: 153 10*3/uL (ref 150–400)
RBC: 4.32 MIL/uL (ref 4.22–5.81)
RDW: 13.7 % (ref 11.5–15.5)
WBC: 12 10*3/uL — ABNORMAL HIGH (ref 4.0–10.5)
nRBC: 0 % (ref 0.0–0.2)

## 2024-05-23 LAB — COMPREHENSIVE METABOLIC PANEL WITH GFR
ALT: 95 U/L — ABNORMAL HIGH (ref 0–44)
AST: 67 U/L — ABNORMAL HIGH (ref 15–41)
Albumin: 3.4 g/dL — ABNORMAL LOW (ref 3.5–5.0)
Alkaline Phosphatase: 87 U/L (ref 38–126)
Anion gap: 9 (ref 5–15)
BUN: 17 mg/dL (ref 8–23)
CO2: 33 mmol/L — ABNORMAL HIGH (ref 22–32)
Calcium: 9.5 mg/dL (ref 8.9–10.3)
Chloride: 94 mmol/L — ABNORMAL LOW (ref 98–111)
Creatinine, Ser: 0.55 mg/dL — ABNORMAL LOW (ref 0.61–1.24)
GFR, Estimated: >60 mL/min
Glucose, Bld: 150 mg/dL — ABNORMAL HIGH (ref 70–99)
Potassium: 3.1 mmol/L — ABNORMAL LOW (ref 3.5–5.1)
Sodium: 136 mmol/L (ref 135–145)
Total Bilirubin: 1 mg/dL (ref 0.0–1.2)
Total Protein: 6.3 g/dL — ABNORMAL LOW (ref 6.5–8.1)

## 2024-05-23 LAB — GLUCOSE, CAPILLARY
Glucose-Capillary: 179 mg/dL — ABNORMAL HIGH (ref 70–99)
Glucose-Capillary: 257 mg/dL — ABNORMAL HIGH (ref 70–99)
Glucose-Capillary: 156 mg/dL — ABNORMAL HIGH (ref 70–99)
Glucose-Capillary: 120 mg/dL — ABNORMAL HIGH (ref 70–99)

## 2024-05-23 LAB — MAGNESIUM: Magnesium: 1.9 mg/dL (ref 1.7–2.4)

## 2024-05-23 MED ORDER — INSULIN ASPART 100 UNIT/ML IJ SOLN: 0.0000 [IU] | Freq: Every day | INTRAMUSCULAR | Status: AC

## 2024-05-23 MED ORDER — POTASSIUM CHLORIDE CRYS ER 20 MEQ PO TBCR
40.0000 meq | EXTENDED_RELEASE_TABLET | ORAL | Status: AC
  Administered 2024-05-23 (×2): 40 meq via ORAL
  Filled 2024-05-23 (×2): qty 2

## 2024-05-23 MED ORDER — INSULIN GLARGINE-YFGN 100 UNIT/ML ~~LOC~~ SOLN
5.0000 [IU] | Freq: Every day | SUBCUTANEOUS | Status: AC
  Administered 2024-05-23: 5 [IU] via SUBCUTANEOUS
  Filled 2024-05-23 (×2): qty 0.05

## 2024-05-23 MED ORDER — INSULIN ASPART 100 UNIT/ML IJ SOLN
0.0000 [IU] | Freq: Three times a day (TID) | INTRAMUSCULAR | Status: AC
  Administered 2024-05-23: 3 [IU] via SUBCUTANEOUS
  Administered 2024-05-23: 8 [IU] via SUBCUTANEOUS
  Administered 2024-05-24: 2 [IU] via SUBCUTANEOUS
  Administered 2024-05-24: 5 [IU] via SUBCUTANEOUS
  Filled 2024-05-23: qty 8
  Filled 2024-05-23: qty 3
  Filled 2024-05-23: qty 1
  Filled 2024-05-23: qty 2

## 2024-05-23 MED ORDER — SENNOSIDES-DOCUSATE SODIUM 8.6-50 MG PO TABS
3.0000 | ORAL_TABLET | Freq: Two times a day (BID) | ORAL | Status: AC
  Administered 2024-05-23 – 2024-05-24 (×3): 3 via ORAL
  Filled 2024-05-23 (×3): qty 3

## 2024-05-23 MED ORDER — ENOXAPARIN SODIUM 40 MG/0.4ML IJ SOSY
40.0000 mg | PREFILLED_SYRINGE | INTRAMUSCULAR | Status: AC
  Administered 2024-05-23 – 2024-05-24 (×2): 40 mg via SUBCUTANEOUS
  Filled 2024-05-23 (×2): qty 0.4

## 2024-05-23 NOTE — Progress Notes (Signed)
 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 Underwent C3-6 PCDF on 2/3  LOS: 6 days    Plan: Activity and diet as tolerated Collar for comfort only. Does not need strict Cspine precautions JP bulb suction - 100cc. Will change to gravity and plan for removal tomorrow Ok for DVT ppx on 2/5 Ok for Xarelto  on 2/10   Subjective: Neuro stable  Objective: Vital signs in last 24 hours: Temp:  [97.5 F (36.4 C)-98.7 F (37.1 C)] 97.7 F (36.5 C) (02/05 1114) Pulse Rate:  [91-109] 97 (02/05 1114) Resp:  [16-18] 18 (02/05 0000) BP: (115-131)/(63-75) 131/75 (02/05 0826) SpO2:  [90 %-95 %] 95 % (02/04 2200)  Intake/Output from previous day: 02/04 0701 - 02/05 0700 In: -  Out: 1010 [Urine:950; Drains:60] Intake/Output this shift: Total I/O In: -  Out: 550 [Urine:550]  Exam: Awake, alert, oriented Delayed initiation of speech and movement, but coherent and follows commands Honeycomb dressing dry JP in place RUE: 4/5 deltoid, 5/5 bicep, 5/5 wrist extension, 5/5 tricep, 4/5 grip LUE: 5/5 deltoid, 5/5 bicep, 5/5 wrist extension, 5/5 tricep, 4/5 grip RLE: 5/5 IP LLE: 5/5 IP    Lab Results: Recent Labs    05/22/24 0231 05/23/24 0227  WBC 16.0* 12.0*  HGB 13.7 13.7  HCT 39.2 39.8  PLT 159 153   BMET Recent Labs    05/22/24 0231 05/23/24 0227  NA 134* 136  K 4.3 3.1*  CL 95* 94*  CO2 31 33*  GLUCOSE 198* 150*  BUN 14 17  CREATININE 0.46* 0.55*  CALCIUM  9.4 9.5       [name] 05/23/2024, 12:48 PM

## 2024-05-23 NOTE — Progress Notes (Signed)
 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  Evaluated by neurosurgery-recommendations were to hold anticoagulation and proceed with cervical decompression on 2/3. C4-6 decompression completed on 2/3.  PMH significant for esophageal dysphagia (? Prior cricopharyngeal narrowing), cognitive dysphagia, PE on anticoagulation, diabetes type 2, hypertension, hyperlipidemia, NAFLD, depression, cognitive impairment, GERD.    PT Comments  Pt continues to demonstrate increased lethargy and difficulty with safety awareness during functional mobility. Pt benefits from step by step cues to perform log roll and does better pushing through B UE to bring trunk upright. Pt demonstrates poor trunk control, frequently leaning posteriorly and to the L but is able to correct with cues. Pt requires modA for STS with heavy cues to manage R LE position when stepping over to chair. Pt demonstrates difficulty with weight shift in order to offload R LE and step out. Assist needed to manage walker when turning. Pt sits prematurely due to fatigue. Seated exercises attempted, pt demonstrates poor control and performs within limited ROM. Pt would benefit from continued PT services focused on bed mobility, strength, balance, transfers, and gait when appropriate. Pt not safe to return home at this time as he lives alone, <3hrs rehab recommended.     If plan is discharge home, recommend the following: A lot of help with bathing/dressing/bathroom;A lot of help with walking and/or transfers;Assistance with cooking/housework;Assist for transportation;Direct supervision/assist for medications  management;Direct supervision/assist for financial management;Supervision due to cognitive status;Help with stairs or ramp for entrance   Can travel by private vehicle     No (Cognition, post op cervical decompression)  Equipment Recommendations  Rolling walker (2 wheels);BSC/3in1    Recommendations for Other Services       Precautions / Restrictions Precautions Precautions: Fall;Cervical Precaution Booklet Issued: No Recall of Precautions/Restrictions: Impaired Precaution/Restrictions Comments: Aspen collar available as needed for  comfort, no strict c-spine precautions but would benefit from following BLTs. Required Braces or Orthoses: Cervical Brace Cervical Brace: Hard collar;For comfort Restrictions Weight Bearing Restrictions Per Provider Order: No     Mobility  Bed Mobility Overal bed mobility: Needs Assistance Bed Mobility: Sidelying to Sit, Sit to Sidelying   Sidelying to sit: Mod assist     Sit to sidelying: Mod assist General bed mobility comments: Pt demonstrates some recall of log roll but still benefits from step by step cues to move B LE over EOB. Increased assistance for managing trunk elevation, assist needed for BLE, difficulty sequencing. Assist to pivot hips EOB.    Transfers Overall transfer level: Needs assistance Equipment used: Rolling walker (2 wheels) Transfers: Sit to/from Stand, Bed to chair/wheelchair/BSC Sit to Stand: Mod assist   Step pivot transfers: Max assist       General transfer comment: Mod A for functional transfers to achieve hip clearance, Posterior lean present in standing and improves partially with repositioning of feet. Pt demonstrates R>L difficulty initiating steps towards recliner. Heavy verbal and tactile cues required to mobilize R LE. Assist to manage walker during turns. Pt begins sitting prematurely.    Ambulation/Gait                   Comptroller  Bed     Modified Rankin (Stroke Patients Only)       Balance Overall balance assessment: Needs assistance Sitting-balance support: Feet supported, Bilateral upper extremity supported Sitting balance-Leahy Scale: Poor Sitting balance - Comments: Min-Mod A to maintain sitting balance EOB, posterior and L lean noted, poor righting reactions but does correct with cues. When asked if the pt can feel himself tipping he responds yes. Postural control: Posterior lean, Left lateral lean Standing balance support: Bilateral upper extremity supported, During functional activity, Reliant on assistive device for balance Standing balance-Leahy Scale: Poor Standing balance comment: Dependent on RW and mod-maxA external support                            Communication Communication Communication: Impaired Factors Affecting Communication: Difficulty expressing self;Reduced clarity of speech  Cognition Arousal: Lethargic Behavior During Therapy: Flat affect   PT - Cognitive impairments: Awareness, Safety/Judgement, Problem solving, Sequencing, Initiation, Attention                       PT - Cognition Comments: Increased time to answer all questions and provide feedback. Poor safety awareness. Following commands: Impaired Following commands impaired: Follows one step commands inconsistently, Follows one step commands with increased time    Cueing Cueing Techniques: Verbal cues, Visual cues, Tactile cues  Exercises General Exercises - Lower Extremity Ankle Circles/Pumps: AROM, Both, Seated (2-3x, limited by fatigue) Long Arc Quad: AROM, Both, 5 reps, Seated Hip Flexion/Marching: AROM, Both, 10 reps, Seated (minimal ROM)    General Comments General comments (skin integrity, edema, etc.): VSS througout. Incision site C,D,I. No new skin abnormalities noted      Pertinent Vitals/Pain Pain Assessment Pain Assessment: No/denies pain    Home Living                           Prior Function            PT Goals (current goals can now be found in the care plan section) Acute Rehab PT Goals Patient Stated Goal: Patient's goal is to improve balance and return home. PT Goal Formulation: With patient Time For Goal Achievement: 06/05/24 Potential to Achieve Goals: Good Progress towards PT goals: Progressing toward goals    Frequency    Min 2X/week      PT Plan      Co-evaluation              AM-PAC PT 6 Clicks Mobility   Outcome Measure  Help needed turning from your back to your side while in a flat bed without using bedrails?: A Lot Help needed moving from lying on your back to sitting on the side of a flat bed without using bedrails?: A Lot Help needed moving to and from a bed to a chair (including a wheelchair)?: A Lot Help needed standing up from a chair using your arms (e.g., wheelchair or bedside chair)?: A Lot Help needed to walk in hospital room?: Total Help needed climbing 3-5 steps with a railing? : Total 6 Click Score: 10    End of Session Equipment Utilized During Treatment: Gait belt Activity Tolerance: Patient limited by lethargy;Patient limited by fatigue Patient left: with call bell/phone within reach;in chair;with chair alarm set Nurse Communication: Mobility status;Precautions PT Visit Diagnosis: Unsteadiness on feet (R26.81);Other abnormalities of gait and mobility (R26.89);Difficulty in walking, not elsewhere classified (R26.2);Repeated falls (R29.6);Muscle weakness (generalized) (M62.81);History  of falling (Z91.81)     Time: 9155-9090 PT Time Calculation (min) (ACUTE ONLY): 25 min  Charges:    $Therapeutic Exercise: 8-22 mins $Therapeutic Activity: 8-22 mins PT General Charges $$ ACUTE PT VISIT: 1 Visit                     Sabra Morel, PT, DPT  Acute Rehabilitation Services         Office: [phone]      Sabra MARLA Morel 05/23/2024, 1:32 PM

## 2024-05-23 NOTE — TOC Progression Note (Addendum)
 Transition of Care [facility]) - Progression Note    Patient Details  Name: Bradley Mcclure MRN: [id number] Date of Birth: 03/08/1961  Transition of [facility]) CM/SW Contact  Inocente GORMAN Kindle, LCSW Phone Number: 05/23/2024, 1:52 PM  Clinical Narrative:    11am-CSW requested bed from [facility] for tomorrow. They will let CSW know in the morning if available.   CSW initiated insurance process, Ref# [id number], and received approval effective 05/24/2024-05/28/2024.  Pasrr documents uploaded but still pending review.   3:20 PM-Pasrr received and placed on the Fl2.   Expected Discharge Plan:  (TBD) Barriers to Discharge: Continued Medical Work up               Expected Discharge Plan and Services In-house Referral: Clinical Social Work Discharge Planning Services: CM Consult   Living arrangements for the past 2 months: Apartment                                       Social Drivers of Health (SDOH) Interventions SDOH Screenings   Food Insecurity: No Food Insecurity (05/17/2024)  Housing: Low Risk (05/17/2024)  Transportation Needs: No Transportation Needs (05/17/2024)  Utilities: Not At Risk (05/17/2024)  Tobacco Use: Low Risk (05/21/2024)    Readmission Risk Interventions     No data to display

## 2024-05-23 NOTE — Plan of Care (Signed)

## 2024-05-23 NOTE — Plan of Care (Signed)
" °  Problem: Nutritional: Goal: Maintenance of adequate nutrition will improve Outcome: Progressing   Problem: Nutrition: Goal: Adequate nutrition will be maintained Outcome: Progressing   Problem: Pain Managment: Goal: General experience of comfort will improve and/or be controlled Outcome: Progressing   "

## 2024-05-23 NOTE — Progress Notes (Signed)
 [facility] Progress Note Patient: Bradley Mcclure FMW:[id number] DOB: September 19, 1960  DOA: 05/16/2024 DOS: the patient was seen and examined on 05/23/2024  Brief Summary: Patient is a 64 y.o.  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  Evaluated by neurosurgery-recommendations were to hold anticoagulation and proceed with cervical decompression on 2/3.   Significant events: 1/30>> admit to [facility]. 2/3>> s/p C3 C6 laminectomy and arthrodesis.  Dr. Dorn Glade.   Significant studies: 1/29>> MRI brain: No acute intracranial abnormality 1/29> MRI C-spine: New inflammation in the upper/mid cervical prevertebral soft tissue-severe spinal stenosis with cord flattening at C4-C6, moderate right-sided spinal stenosis with mild cord flattening C5-C6. 1/29>> MRI T-spine: No evidence of infection or compressive stenosis 1/29>> MRI L-spine: No evidence of lumbar spine infection-no spinal stenosis. 1/29>> CT abdomen/pelvis: No acute findings.   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 Neurosurgery consulted. Underwent decompressive surgery on 2/3. Management per neurosurgery. Still has JP bulb drain.  Likely will be removed tomorrow. From neurosurgery-okay to use DVT prophylaxis in 2/5, okay for returning to Xarelto  on 2/10.  Collar for comfort only.  Activity as tolerated. PT OT consulted.  SNF recommended.  Monitor.   Leukocytosis Likely reactive No evidence of infection Had resolved but again has leukocytosis this  morning-without any symptoms or suggestion of infection Continue to monitor off antibiotics   Constipation Last BM 2/1 MiraLAX /senna As needed Dulcolax suppository   History of PE Off rivaroxaban -as scheduled for C-spine decompressive surgery 2/3.   Chronic HFpEF Euvolemic   HTN BP stable on metoprolol  Per prior note-metoprolol  discontinued by cardiology during his most recent outpatient visit-however since BP was elevated-he has been restarted on it during this hospitalization.   HLD Continue statin.   DM-2 (A1c 5.3 on 1/30) well-controlled.  With ong-term insulin  use.  Without complication. CBG stable SSI while inpatient.  Holding metformin , semaglutide, Lantus  Resume metformin  on discharge.  On semaglutide as well.   Mood disorder Seems stable Fluoxetine /Abilify    LFT elevation. Postoperatively his bilirubin as well as AST ALT remains elevated. This is likely in the setting of hemodynamic injury.  Improving. Monitor LFT for now.    Code Status: Full Code     DVT Prophylaxis: enoxaparin  (LOVENOX ) injection 40 mg Start: 05/23/24 1000 Place and maintain sequential compression device Start: 05/22/24 1557   Data review I have Reviewed nursing notes, Vitals, and Lab results. Since last encounter, pertinent lab results CBC and BMP   . I have ordered test including CBC and BMP  .   Physical exam. Vitals:   05/23/24 0759 05/23/24 0826 05/23/24 1114 05/23/24 1537  BP:  131/75    Pulse:  94 97 87  Resp:      Temp: (!) 97.5 F (36.4 C)  97.7 F (36.5 C) 97.6 F (36.4 C)  TempSrc: Oral  Oral Oral  SpO2:      Weight:      Height:      Clear to auscultation S1-S2 present. Bowel sound present No edema. Generalized weakness but equal strength bilaterally.  Subjective: Denies any acute complaint.  Passing gas.  No nausea no vomiting.  Family Communication: No  one at bedside.  Disposition Plan: Status is: Inpatient Remains inpatient appropriate because: Likely  stable for SNF tomorrow.   Planned Discharge Destination: SNF Diet: Diet Order             Diet Carb Modified Room service appropriate? Yes  Diet effective now                   MEDICATIONS: Scheduled Meds:  ARIPiprazole   2 mg Oral Daily   cyclobenzaprine   10 mg Oral TID   enoxaparin  (LOVENOX ) injection  40 mg Subcutaneous Q24H   insulin  aspart  0-15 Units Subcutaneous TID WC   insulin  aspart  0-5 Units Subcutaneous QHS   insulin  glargine-yfgn  5 Units Subcutaneous QHS   metoprolol  tartrate  25 mg Oral BID   pantoprazole   40 mg Oral Daily   polyethylene glycol  17 g Oral BID   senna-docusate  3 tablet Oral BID   Continuous Infusions: PRN Meds:.acetaminophen  **OR** acetaminophen , albuterol , bisacodyl , FLUoxetine , hydrALAZINE , labetalol , morphine  injection, oxyCODONE   Author: Yetta Blanch, MD  [facility] 05/23/2024  6:50 PM Between 7PM-7AM, please contact night-coverage, check [email or web address] for on call.

## 2024-05-24 ENCOUNTER — Other Ambulatory Visit (HOSPITAL_COMMUNITY): Payer: Self-pay

## 2024-05-24 ENCOUNTER — Inpatient Hospital Stay (HOSPITAL_COMMUNITY)

## 2024-05-24 LAB — COMPREHENSIVE METABOLIC PANEL WITH GFR
ALT: 101 U/L — ABNORMAL HIGH (ref 0–44)
AST: 60 U/L — ABNORMAL HIGH (ref 15–41)
Albumin: 3.1 g/dL — ABNORMAL LOW (ref 3.5–5.0)
Alkaline Phosphatase: 90 U/L (ref 38–126)
Anion gap: 7 (ref 5–15)
BUN: 11 mg/dL (ref 8–23)
CO2: 31 mmol/L (ref 22–32)
Calcium: 9.3 mg/dL (ref 8.9–10.3)
Chloride: 97 mmol/L — ABNORMAL LOW (ref 98–111)
Creatinine, Ser: 0.48 mg/dL — ABNORMAL LOW (ref 0.61–1.24)
GFR, Estimated: >60 mL/min
Glucose, Bld: 141 mg/dL — ABNORMAL HIGH (ref 70–99)
Potassium: 4 mmol/L (ref 3.5–5.1)
Sodium: 135 mmol/L (ref 135–145)
Total Bilirubin: 1 mg/dL (ref 0.0–1.2)
Total Protein: 6.1 g/dL — ABNORMAL LOW (ref 6.5–8.1)

## 2024-05-24 LAB — CBC WITH DIFFERENTIAL/PLATELET
Abs Immature Granulocytes: 0.05 10*3/uL (ref 0.00–0.07)
Basophils Absolute: 0 10*3/uL (ref 0.0–0.1)
Basophils Relative: 0 %
Eosinophils Absolute: 0.2 10*3/uL (ref 0.0–0.5)
Eosinophils Relative: 1 %
HCT: 39.9 % (ref 39.0–52.0)
Hemoglobin: 13.3 g/dL (ref 13.0–17.0)
Immature Granulocytes: 0 %
Lymphocytes Relative: 12 %
Lymphs Abs: 1.5 10*3/uL (ref 0.7–4.0)
MCH: 31.1 pg (ref 26.0–34.0)
MCHC: 33.3 g/dL (ref 30.0–36.0)
MCV: 93.4 fL (ref 80.0–100.0)
Monocytes Absolute: 0.9 10*3/uL (ref 0.1–1.0)
Monocytes Relative: 7 %
Neutro Abs: 9.6 10*3/uL — ABNORMAL HIGH (ref 1.7–7.7)
Neutrophils Relative %: 80 %
Platelets: 141 10*3/uL — ABNORMAL LOW (ref 150–400)
RBC: 4.27 MIL/uL (ref 4.22–5.81)
RDW: 13.8 % (ref 11.5–15.5)
WBC: 12.2 10*3/uL — ABNORMAL HIGH (ref 4.0–10.5)
nRBC: 0 % (ref 0.0–0.2)

## 2024-05-24 LAB — GLUCOSE, CAPILLARY
Glucose-Capillary: 130 mg/dL — ABNORMAL HIGH (ref 70–99)
Glucose-Capillary: 238 mg/dL — ABNORMAL HIGH (ref 70–99)
Glucose-Capillary: 114 mg/dL — ABNORMAL HIGH (ref 70–99)

## 2024-05-24 LAB — MAGNESIUM: Magnesium: 1.8 mg/dL (ref 1.7–2.4)

## 2024-05-24 MED ORDER — SENNOSIDES-DOCUSATE SODIUM 8.6-50 MG PO TABS: 2.0000 | ORAL_TABLET | Freq: Two times a day (BID) | ORAL | Status: AC

## 2024-05-24 MED ORDER — BISACODYL 10 MG RE SUPP
10.0000 mg | Freq: Every day | RECTAL | 0 refills | Status: AC | PRN
  Filled 2024-05-24 (×2): qty 12, 12d supply, fill #0

## 2024-05-24 MED ORDER — OXYCODONE HCL 5 MG PO TABS: 5.0000 mg | ORAL_TABLET | Freq: Four times a day (QID) | ORAL | 0 refills | Status: AC | PRN

## 2024-05-24 MED ORDER — POLYETHYLENE GLYCOL 3350 17 GM/SCOOP PO POWD
17.0000 g | Freq: Two times a day (BID) | ORAL | 0 refills | Status: AC
  Filled 2024-05-24: qty 238, 7d supply, fill #0

## 2024-05-24 MED ORDER — CYCLOBENZAPRINE HCL 10 MG PO TABS
10.0000 mg | ORAL_TABLET | Freq: Three times a day (TID) | ORAL | 0 refills | Status: AC | PRN
  Filled 2024-05-24: qty 30, 10d supply, fill #0

## 2024-05-24 NOTE — Progress Notes (Signed)
 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 Underwent C3-6 PCDF on 2/3  LOS: 7 days    Plan: Activity and diet as tolerated Collar for comfort only. Does not need strict Cspine precautions JP drain removed Ok for DVT ppx on 2/5 Ok for Xarelto  on 2/10 Clear for discharge from surgical perspective  Subjective: Nae o/n  Objective: Vital signs in last 24 hours: Temp:  [97.6 F (36.4 C)-98.1 F (36.7 C)] 98 F (36.7 C) (02/06 1130) Pulse Rate:  [83-94] 94 (02/06 1130) Resp:  [14-18] 18 (02/06 1130) BP: (110-127)/(62-67) 110/62 (02/06 1130) SpO2:  [93 %-100 %] 93 % (02/06 1130)  Intake/Output from previous day: 02/05 0701 - 02/06 0700 In: -  Out: 1450 [Urine:1400; Drains:50] Intake/Output this shift: No intake/output data recorded.  Exam: Awake, alert, oriented Delayed initiation of speech and movement, but coherent and follows commands Posterior incision c/d/I, glue RUE: 4/5 deltoid, 5/5 bicep, 5/5 wrist extension, 5/5 tricep, 4/5 grip LUE: 5/5 deltoid, 5/5 bicep, 5/5 wrist extension, 5/5 tricep, 4/5 grip RLE: 5/5 IP LLE: 5/5 IP    Lab Results: Recent Labs    05/23/24 0227 05/24/24 0547  WBC 12.0* 12.2*  HGB 13.7 13.3  HCT 39.8 39.9  PLT 153 141*   BMET Recent Labs    05/23/24 0227 05/24/24 0547  NA 136 135  K 3.1* 4.0  CL 94* 97*  CO2 33* 31  GLUCOSE 150* 141*  BUN 17 11  CREATININE 0.55* 0.48*  CALCIUM  9.5 9.3       Bradley Mcclure 05/24/2024, 1:41 PM

## 2024-05-24 NOTE — Discharge Summary (Signed)
 " Physician Discharge Summary   Patient: Bradley Mcclure MRN: [id number] DOB: 1961/04/15  Admit date:     05/16/2024  Discharge date: 05/24/24  Discharge Physician: Yetta Blanch  PCP: [facility]  Disposition: SNF Recommendations at discharge: Follow-up with neurosurgery as recommended. Follow-up with PCP in 1 week with CBC and BMP.   Contact information for follow-up providers     Darnella Dorn SAUNDERS, MD Follow up in 6 week(s).   Specialty: Neurosurgery Contact information: [address] [place], [address] [place] [address] [phone]         [facility] Schedule an appointment as soon as possible for a visit in 2 week(s).   Why: with BMP lab to look at kidney/electrolyte numbers, with CBC lab to look at blood counts Contact information: [address] [facility] [place] [address] [phone]              Contact information for after-discharge care     Destination     [facility] .   Service: Skilled Nursing Contact information: [address] [place]  [address] [phone]                    Hospital Course: Patient is a 64 y.o.  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  Evaluated by neurosurgery-recommendations were to hold anticoagulation and proceed with cervical decompression on 2/3.   Significant events: 1/30>> admit to [facility]. 2/3>> s/p C3 C6 laminectomy and arthrodesis.  Dr. Dorn Darnella.   Significant studies: 1/29>> MRI brain: No acute intracranial abnormality 1/29> MRI C-spine: New inflammation in the upper/mid cervical prevertebral soft tissue-severe spinal stenosis with cord flattening at C4-C6, moderate right-sided spinal  stenosis with mild cord flattening C5-C6. 1/29>> MRI T-spine: No evidence of infection or compressive stenosis 1/29>> MRI L-spine: No evidence of lumbar spine infection-no spinal stenosis. 1/29>> CT abdomen/pelvis: No acute findings.   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 Neurosurgery consulted. Underwent decompressive surgery on 2/3. Management per neurosurgery. Still has JP bulb drain.  Likely will be removed tomorrow. From neurosurgery-okay to use DVT prophylaxis in 2/5, okay for returning to Xarelto  on 2/10.  Collar for comfort only.  Activity as tolerated. PT OT consulted.  SNF recommended.     Leukocytosis Likely reactive No evidence of infection   Constipation X-ray does not show any evidence of severe constipation or obstruction. Continue MiraLAX /senna As needed Dulcolax suppository   History of PE Off rivaroxaban -as scheduled for C-spine decompressive surgery 2/3.   Chronic HFpEF Euvolemic   HTN BP stable on metoprolol  Per prior note-metoprolol  discontinued by cardiology during his most recent outpatient visit-however since BP was elevated-he has been restarted on it during this hospitalization.   HLD Continue statin.   DM-2 (A1c 5.3 on 1/30) well-controlled.  With ong-term insulin  use.  Without complication. CBG stable Was on sliding scale while inpatient Resume metformin , semaglutide,  Given stability we will hold Lantus    Mood disorder Seems stable Fluoxetine /Abilify    LFT elevation. Postoperatively his bilirubin as well as AST ALT remains elevated. This is likely in the setting of hemodynamic injury.  Improving. Monitor LFT  for now.   Code Status: Full Code    Pain control - [place]  Controlled Substance  Reporting System database was reviewed. and patient was instructed, not to drive, operate heavy machinery, perform activities at heights, swimming or participation in water activities or provide baby-sitting services while on Pain, Sleep and Anxiety Medications; until their outpatient Physician has advised to do so again. Also recommended to not to take more than prescribed Pain, Sleep and Anxiety Medications.   Consultants:  Neurosurgery  Procedures performed:  Posterior cervical segmental instrumentation and arthrodesis C3-C6 Posterior cervical laminectomy C3-C6 Harvest of local autograft Neuromonitoring  DISCHARGE MEDICATION: Allergies as of 05/24/2024       Reactions   Empagliflozin  Other (See Comments)   Acidosis        Medication List     PAUSE taking these medications    insulin  glargine 100 UNIT/ML Solostar Pen Wait to take this until your doctor or other care provider tells you to start again. Commonly known as: LANTUS  Inject 30 Units into the skin daily.   rivaroxaban  20 MG Tabs tablet Wait to take this until: May 28, 2024 Evening Commonly known as: XARELTO  Take 20 mg by mouth daily with supper.       TAKE these medications    acetaminophen  325 MG tablet Commonly known as: TYLENOL  Take 650 mg by mouth every 6 (six) hours as needed for mild pain.   ARIPiprazole  2 MG tablet Commonly known as: ABILIFY  Take 4 mg by mouth daily.   bisacodyl  10 MG suppository Commonly known as: DULCOLAX Place 1 suppository (10 mg total) rectally daily as needed for moderate constipation.   cyanocobalamin  500 MCG tablet Commonly known as: VITAMIN B12 Take 500 mcg by mouth daily.   cyclobenzaprine  10 MG tablet Commonly known as: FLEXERIL  Take 1 tablet (10 mg total) by mouth 3 (three) times daily as needed for muscle spasms.   FLUoxetine  20 MG capsule Commonly known as: PROZAC  Take 20 mg by mouth daily.   metFORMIN  500 MG 24 hr tablet Commonly known as:  GLUCOPHAGE -XR Take 1,000 mg by mouth in the morning and at bedtime.   metoprolol  tartrate 50 MG tablet Commonly known as: LOPRESSOR  Take 25 mg by mouth 2 (two) times daily.   oxyCODONE  5 MG immediate release tablet Commonly known as: Oxy IR/ROXICODONE  Take 1 tablet (5 mg total) by mouth every 6 (six) hours as needed for up to 7 days for severe pain (pain score 7-10).   pantoprazole  40 MG tablet Commonly known as: PROTONIX  Take 1 tablet (40 mg total) by mouth daily.   polyethylene glycol 17 g packet Commonly known as: MIRALAX  / GLYCOLAX  Take 17 g by mouth 2 (two) times daily. What changed: when to take this   Refresh Plus 0.5 % Soln Generic drug: Carboxymethylcellulose Sod PF Place 1 drop into both eyes 4 (four) times daily as needed (for dry eye).   rosuvastatin  20 MG tablet Commonly known as: CRESTOR  Take 10 mg by mouth daily.   Semaglutide (2 MG/DOSE) 8 MG/3ML Sopn Inject 2 mg into the skin every 7 (seven) days.   senna-docusate 8.6-50 MG tablet Commonly known as: Senokot-S Take 2 tablets by mouth 2 (two) times daily.               Discharge Care Instructions  (From admission, onward)           Start     Ordered   05/24/24 0000  Leave dressing on - Keep it clean, dry,  and intact until clinic visit        05/24/24 1410            Diet recommendation: Carb modified diet  Discharge Exam: Vitals:   05/24/24 0200 05/24/24 0400 05/24/24 0800 05/24/24 1130  BP:  120/67  110/62  Pulse:  84  94  Resp: 14 14  18   Temp:  98 F (36.7 C) 97.6 F (36.4 C) 98 F (36.7 C)  TempSrc:  Oral Axillary Axillary  SpO2:    93%  Weight:      Height:        Filed Weights   05/16/24 0914 05/21/24 1147  Weight: 94 kg 94 kg   Clear to auscultation. S1-S2 present. Bowel sound present. Nontender. Bilateral equal strength generalized weakness.  Condition at discharge: stable  The results of significant diagnostics from this hospitalization (including  imaging, microbiology, ancillary and laboratory) are listed below for reference.   Imaging Studies: DG Cervical Spine 2 or 3 views Result Date: 05/22/2024 CLINICAL DATA:  Elective surgery.  Postop. EXAM: CERVICAL SPINE - 2-3 VIEW COMPARISON:  Preoperative MRI FINDINGS: AP and lateral views submitted. Posterior rod and pedicle screw fixation C3 through C6. The hardware is intact. Drain overlies the hardware posteriorly. No evidence of acute fracture. Small disc space at C3-C4 is again seen. IMPRESSION: Posterior rod and pedicle screw fixation C3 through C6. No immediate postoperative complication. Electronically Signed   By: Andrea Gasman M.D.   On: 05/22/2024 12:05   DG Cervical Spine 2 or 3 views Result Date: 05/21/2024 CLINICAL DATA:  Posterior cervical fusion EXAM: CERVICAL SPINE - 2-3 VIEW COMPARISON:  05/16/2024 FINDINGS: Five fluoroscopic images are obtained during the performance of the procedure and are provided for interpretation only. Imaging demonstrates placement of posterior fusion hardware at C3, C4, C5, and C6. Please refer to operative report for full description of findings. Fluoroscopy time: 13.1 seconds, 1.33 mGy IMPRESSION: 1. Intraoperative evaluation as above. Please refer to the operative report. Electronically Signed   By: Ozell Daring M.D.   On: 05/21/2024 20:05   DG C-Arm 1-60 Min-No Report Result Date: 05/21/2024 Fluoroscopy was utilized by the requesting physician.  No radiographic interpretation.   DG C-Arm 1-60 Min-No Report Result Date: 05/21/2024 Fluoroscopy was utilized by the requesting physician.  No radiographic interpretation.   CT ABDOMEN PELVIS W CONTRAST Result Date: 05/16/2024 EXAM: CT ABDOMEN AND PELVIS WITH CONTRAST 05/16/2024 11:19:59 PM TECHNIQUE: CT of the abdomen and pelvis was performed with the administration of 75 mL of iohexol  (OMNIPAQUE ) 350 MG/ML injection. Multiplanar reformatted images are provided for review. Automated exposure control,  iterative reconstruction, and/or weight-based adjustment of the mA/kV was utilized to reduce the radiation dose to as low as reasonably achievable. COMPARISON: Pelvic radiograph 05/14/2024. CLINICAL HISTORY: Abdominal pain, acute, nonlocalized. Weakness, fall, sepsis. FINDINGS: LOWER CHEST: Emphysematous changes and scarring in the lung bases. LIVER: The liver is unremarkable. GALLBLADDER AND BILE DUCTS: Gallbladder is unremarkable. No biliary ductal dilatation. SPLEEN: No acute abnormality. PANCREAS: No acute abnormality. ADRENAL GLANDS: No acute abnormality. KIDNEYS, URETERS AND BLADDER: No stones in the kidneys or ureters. No hydronephrosis. No perinephric or periureteral stranding. Urinary bladder is unremarkable. GI AND BOWEL: Stomach demonstrates no acute abnormality. The stomach, small bowel, and colon are not abnormally distended. There is stool throughout the colon with moderate stool burden. The small bowel is decompressed. Appendix is not identified. There is no bowel obstruction. PERITONEUM AND RETROPERITONEUM: No ascites. No free air. VASCULATURE: Aorta is normal  in caliber. Calcification of the aorta. No aneurysm. LYMPH NODES: No lymphadenopathy. No pelvic lymphadenopathy. REPRODUCTIVE ORGANS: The prostate gland is mildly enlarged. BONES AND SOFT TISSUES: Degenerative changes in the spine. No acute osseous abnormality. No focal soft tissue abnormality. IMPRESSION: 1. No acute findings in the abdomen or pelvis. 2. Moderate colonic stool burden. Electronically signed by: Elsie Gravely MD 05/16/2024 11:26 PM EST RP Workstation: [id number]   MR Lumbar Spine W Wo Contrast Result Date: 05/16/2024 EXAM: MRI LUMBAR SPINE 05/16/2024 06:03:28 PM TECHNIQUE: Multiplanar multisequence MRI of the lumbar spine was performed with and without the administration of intravenous contrast. COMPARISON: CT lumbar spine 05/14/2024. CLINICAL HISTORY: Low back pain, infection suspected, no prior imaging. FINDINGS:  LIMITATIONS/ARTIFACTS: The examination is motion degraded, moderately so on the axial T1 postcontrast sequence and mildly elsewhere. BONES AND ALIGNMENT: 5 lumbar type vertebrae. Normal alignment. No fracture, suspicious marrow lesion, significant marrow edema, or evidence of discitis. Interbody ankylosis at L4-L5. SPINAL CORD: The conus medullaris terminates at T12-L1 and is normal in signal. SOFT TISSUES: No epidural or paraspinal collection. T12-L1: Mild facet and ligamentum flavum hypertrophy without disc herniation or stenosis. L1-L2: Mild facet and ligamentum flavum hypertrophy without disc herniation or stenosis. L2-L3: Mild facet hypertrophy without disc herniation or stenosis. L3-L4: Minimal disc bulging and mild to moderate facet hypertrophy without stenosis. L4-L5: Disc bulging, endplate spurring, and mild to moderate facet hypertrophy result in mild bilateral neural foraminal stenosis without spinal stenosis. L5-S1: Disc bulging, a left foraminal disc osteophyte complex, and mild facet hypertrophy result in mild right and moderate left neural foraminal stenosis without spinal stenosis. IMPRESSION: 1. No evidence of lumbar spine infection. 2. Lumbar spondylosis and facet hypertrophy with moderate left neural foraminal stenosis at L5-S1. No spinal stenosis. Electronically signed by: Dasie Hamburg MD 05/16/2024 07:15 PM EST RP Workstation: [id number]   MR Cervical Spine W and Wo Contrast Addendum Date: 05/16/2024 ** ADDENDUM #1 ** ADDENDUM: Given the additional history of a recent fall, the prevertebral swelling could also be traumatic with mild STIR hyperintensity in the C4-5 disc potentially reflecting an element of traumatic disc and ligamentous injury. ---------------------------------------------------- Electronically signed by: Dasie Hamburg MD 05/16/2024 07:09 PM EST RP Workstation: [id number]   Result Date: 05/16/2024 ** ORIGINAL REPORT ** EXAM: MRI CERVICAL SPINE WITH AND WITHOUT CONTRAST  05/16/2024 06:03:28 PM TECHNIQUE: Multiplanar multisequence MRI of the cervical spine was performed without and with the administration of intravenous contrast. COMPARISON: CT cervical spine 05/14/2024. CLINICAL HISTORY: Cervical radiculopathy, infection suspected, no prior imaging. FINDINGS: LIMITATIONS/ARTIFACTS: Mildly to moderately motion degraded. BONES AND ALIGNMENT: Straightening of the normal cervical lordosis. No significant listhesis. No fracture, suspicious marrow lesion, significant marrow edema, or evidence of discitis. SPINAL CORD: Normal spinal cord signal. SOFT TISSUES: Thickening/edema and enhancement of the upper cervical prevertebral soft tissues measuring up to 7 mm in thickness at C3-C4 and new from the recent prior CT. No epidural collection. C2-C3: Mild disc bulging and facet arthrosis without significant stenosis. C3-C4: Incomplete segmentation. No stenosis. 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 C5-C6: A broad based posterior disc osteophyte complex including more focal right paracentral component results in moderate right sided spinal stenosis with mild cord flattening and moderate to severe right and moderate left neural foraminal stenosis. C6-C7: A broad based posterior disc osteophyte complex eccentric to the left results in mild spinal stenosis and mild  right and mild to moderate left neural foraminal stenosis. C7-T1: Negative. IMPRESSION: 1. New mild inflammation in the upper and mid cervical prevertebral soft tissues, of indeterminate etiology. No evidence of an organized abscess or discitis/osteomyelitis. 2. Severe spinal stenosis with severe cord flattening and moderate bilateral neural foraminal stenosis at C4-5. 3. Moderate right-sided spinal stenosis with mild cord flattening  and moderate to severe right and moderate left neural foraminal stenosis at C5-6. Electronically signed by: Dasie Hamburg MD 05/16/2024 06:59 PM EST RP Workstation: [id number]   MR THORACIC SPINE W WO CONTRAST Result Date: 05/16/2024 EXAM: MRI THORACIC SPINE WITH AND WITHOUT INTRAVENOUS CONTRAST 05/16/2024 06:03:28 PM TECHNIQUE: Multiplanar multisequence MRI of the thoracic spine was performed with and without the administration of 9.5 mL gadobutrol  (GADAVIST  1 MMOL/ML injection 9.5 mL GADOBUTROL  1 MMOL/ML IV SOLN) intravenous contrast. COMPARISON: None available. CLINICAL HISTORY: Mid-back pain, infection suspected, no prior imaging. FINDINGS: LIMITATIONS: The examination is motion degraded, moderately so on the axial T1 postcontrast sequence and mildly on other sequences. BONES AND ALIGNMENT: Mildly exaggerated thoracic kyphosis. No significant listhesis. Hemangiomas in the T7 and T9 vertebral bodies. No fracture, suspicious marrow lesion, significant marrow edema, or evidence of discitis. Scattered small mid and lower thoracic Schmorl nodes. SPINAL CORD: Normal spinal cord volume. Normal spinal cord signal. SOFT TISSUES: No epidural or paraspinal collection. DEGENERATIVE CHANGES: Mild diffuse thoracic spondylosis and moderate facet arthrosis. No compressive spinal canal or neural foraminal stenosis. IMPRESSION: 1. No evidence of infection or compressive stenosis in the thoracic spine. Electronically signed by: Dasie Hamburg MD 05/16/2024 07:03 PM EST RP Workstation: [id number]   MR BRAIN WO CONTRAST Result Date: 05/16/2024 EXAM: MRI BRAIN WITHOUT CONTRAST 05/16/2024 06:03:28 PM TECHNIQUE: Multiplanar multisequence MRI of the head/brain was performed without the administration of intravenous contrast. COMPARISON: Head CT 05/16/2024 and MRI 09/15/2023. CLINICAL HISTORY: Neuro deficit, acute, stroke suspected. Acute neurologic deficit; stroke suspected. FINDINGS: The examination is mildly to moderately motion  degraded. BRAIN AND VENTRICLES: There is no evidence of an acute infarct, intracranial hemorrhage, mass, midline shift, hydrocephalus, or extra-axial fluid collection. Patchy T2 hyperintensities in the cerebral white matter bilaterally have mildly progressed from the prior MRI and are nonspecific but compatible with moderate chronic small vessel ischemic disease. There is T2 shine through on diffusion weighted imaging associated with a new small focus of gliosis in the left frontal white matter. Confluent T2 hyperintensity in the pons is unchanged. There is mild cerebral atrophy. 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 Major intracranial vascular flow voids are preserved. ORBITS: Right cataract extraction and scleral buckle. SINUSES AND MASTOIDS: Mild mucosal thickening in the right sphenoid sinus. Clear mastoid air cells. BONES AND SOFT TISSUES: Normal marrow signal. No soft tissue abnormality. IMPRESSION: 1. No evidence of an acute intracranial abnormality on this motion-degraded examination. 2. Age-advanced chronic small vessel ischemia in the cerebral white matter and pons. 3. Cerebral Atrophy (ICD10-G31.9). Electronically signed by: Dasie Hamburg MD 05/16/2024 06:47 PM EST RP Workstation: [id number]   CT Head Wo Contrast Result Date: 05/16/2024 EXAM: CT HEAD WITHOUT CONTRAST 05/16/2024 10:43:56 AM TECHNIQUE: CT of the head was performed without the administration of intravenous contrast. Automated exposure control, iterative reconstruction, and/or weight based adjustment of the mA/kV was utilized to reduce the radiation dose to as low as reasonably achievable. COMPARISON: 05/14/2024 CLINICAL HISTORY: Head trauma and abnormal mental status (Age 14-64y). FINDINGS: BRAIN AND VENTRICLES: No acute hemorrhage. No evidence of acute infarct. No hydrocephalus. No extra-axial collection. No  mass effect or midline shift. Nonspecific periventricular and subcortical white matter  hypoattenuation, consistent with chronic microvascular ischemic changes. No lesion is evident within the right thalamus. Previously described focal hypodensity likely represents volume averaging with adjacent choroid plexus. Calcified atherosclerotic plaque within cavernous/supraclinoid internal carotid arteries. ORBITS: Right lens replacement and scleral band. SINUSES: Mucosal thickening in right sphenoid sinus. SOFT TISSUES AND SKULL: No acute soft tissue abnormality. No skull fracture. IMPRESSION: 1. No acute intracranial abnormality. No right thalamic lesion; previously described focal hypodensity likely represents volume averaging with adjacent choroid plexus. 2. Nonspecific periventricular and subcortical white matter hypoattenuation, consistent with chronic microvascular ischemic changes. 3. Mucosal thickening in right sphenoid sinus. Electronically signed by: Evalene Coho MD 05/16/2024 11:15 AM EST RP Workstation: [id number]   DG Chest Port 1 View Result Date: 05/16/2024 EXAM: 1 VIEW(S) XRAY OF THE CHEST 05/16/2024 09:43:00 AM COMPARISON: 05/14/2024 CLINICAL HISTORY: Fatigue. FINDINGS: LUNGS AND PLEURA: No airspace consolidation. Stable chronic peripheral basilar reticular interstitial opacities. No pleural effusion. No pneumothorax. HEART AND MEDIASTINUM: No acute abnormality of the cardiac and mediastinal silhouettes. BONES AND SOFT TISSUES: No acute osseous abnormality. IMPRESSION: 1. No acute cardiopulmonary abnormality. Electronically signed by: Waddell Calk MD 05/16/2024 10:47 AM EST RP Workstation: [id number]   DG Chest Portable 1 View Result Date: 05/14/2024 CLINICAL DATA:  fall EXAM: PORTABLE CHEST 1 VIEW COMPARISON:  Chest XR, 09/20/2023.  CT chest, 08/31/2023. FINDINGS: Cardiomediastinal silhouette is within normal limits. Lungs are mildly hypoinflated. Interstitial thickening without focal consolidation. No pleural effusion or pneumothorax. No acute displaced fracture. IMPRESSION: No  acute cardiopulmonary process. Electronically Signed   By: Thom Hall M.D.   On: 05/14/2024 13:11   DG Femur Portable Min 2 Views Right Result Date: 05/14/2024 CLINICAL DATA:  fall EXAM: RIGHT FEMUR PORTABLE 2 VIEW COMPARISON:  CT L-spine and pelvic XR, 05/14/2024. FINDINGS: There is no evidence of fracture or other focal bone lesions. Soft tissues are unremarkable. IMPRESSION: No acute displaced fracture or dislocation of the RIGHT femur. Electronically Signed   By: Thom Hall M.D.   On: 05/14/2024 13:08   DG Pelvis Portable Result Date: 05/14/2024 CLINICAL DATA:  fall EXAM: PORTABLE PELVIS 1-2 VIEWS COMPARISON:  CT L-spine, 05/14/2024 FINDINGS: There is no evidence of pelvic fracture or diastasis. No pelvic bone lesions are seen. IMPRESSION: No acute displaced pelvic fracture, symphyseal or sacroiliac diastasis. Electronically Signed   By: Thom Hall M.D.   On: 05/14/2024 13:06   CT Cervical Spine Wo Contrast Result Date: 05/14/2024 EXAM: CT CERVICAL SPINE WITHOUT CONTRAST 05/14/2024 12:20:07 PM TECHNIQUE: CT of the cervical spine was performed without the administration of intravenous contrast. Multiplanar reformatted images are provided for review. Automated exposure control, iterative reconstruction, and/or weight based adjustment of the mA/kV was utilized to reduce the radiation dose to as low as reasonably achievable. Exam is degraded by motion artifact. COMPARISON: A CT cervical spine 09/06/2022. CLINICAL HISTORY: 64 year old male with neck trauma. FINDINGS: BONES AND ALIGNMENT: No acute fracture or traumatic malalignment. 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 Redemonstrated incomplete segmentation of C3-C4. SOFT TISSUES: No prevertebral soft tissue swelling. LUNGS: Biapical faint reticular opacities. IMPRESSION: 1. No evidence of acute traumatic injury. Electronically signed by: Prentice Spade MD  05/14/2024 12:56 PM EST RP Workstation: [id number]   CT Head Wo Contrast Result Date: 05/14/2024 EXAM: CT HEAD WITHOUT CONTRAST 05/14/2024 12:20:07 PM TECHNIQUE: CT of the head was performed without the administration of intravenous contrast. Automated exposure control,  iterative reconstruction, and/or weight based adjustment of the mA/kV was utilized to reduce the radiation dose to as low as reasonably achievable. COMPARISON: Head CT 09/15/2023 and MRI brain 09/15/2023. CLINICAL HISTORY: Head trauma, coagulopathy (Age 20-64y). FINDINGS: BRAIN AND VENTRICLES: No acute intracranial hemorrhage. Overall similar moderate scattered white matter hypodensities which are nonspecific but most commonly represent chronic microvascular ischemic changes. Increased conspicuity of a subcentimeter focus of hypoattenuation within the anterior superior right thalamus (series 2, image 22), which may represent small infarct versus volume averaging. No hydrocephalus. No extra-axial collection. No mass effect or midline shift. ORBITS: No acute abnormality. SINUSES: No acute abnormality. SOFT TISSUES AND SKULL: No acute soft tissue abnormality. No calvarial fracture. The coronal series does not include the posterior-most aspect of the scalp and cranium. IMPRESSION: 1. No acute intracranial hemorrhage or calvarial fracture. 2. Increased conspicuity of a subcentimeter hypoattenuating focus in the right thalamus, possibly representing an age-indeterminate small infarct versus volume averaging. MRI Brain would be useful to further assess. Electronically signed by: Prentice Spade MD 05/14/2024 12:46 PM EST RP Workstation: [id number]   CT Lumbar Spine Wo Contrast Result Date: 05/14/2024 EXAM: CT OF THE LUMBAR SPINE WITHOUT CONTRAST 05/14/2024 12:20:07 PM TECHNIQUE: CT of the lumbar spine was performed without the administration of intravenous contrast. Multiplanar reformatted images are provided for review. Automated exposure control,  iterative reconstruction, and/or weight based adjustment of the mA/kV was utilized to reduce the radiation dose to as low as reasonably achievable. COMPARISON: None available. CLINICAL HISTORY: 64 year old male. Back trauma, fell on ice, pain, no prior imaging. FINDINGS: Normal lumbar segmentation. BONES AND ALIGNMENT: Normal vertebral body heights. Mild straightening of lordosis. No acute lumbar fracture identified. No suspicious bone lesion. Intact visible sacrum and SI joints. DEGENERATIVE CHANGES: Interbody ankylosis from the visible lower thoracic spine through L1 and again at L4-L5. Generally mild for age lumbar spine degeneration elsewhere and no CT evidence of lumbar spinal stenosis. SOFT TISSUES: Negative lumbar paraspinal soft tissues. Mild aortoiliac calcified atherosclerosis. Negative visible other non-contrast abdominal viscera. Mild diverticulosis of the visible distal colon. IMPRESSION: 1. No acute traumatic injury identified in the lumbar spine. 2. 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 3. Aortic Atherosclerosis (ICD10-I70.0). Electronically signed by: Helayne Hurst MD 05/14/2024 12:35 PM EST RP Workstation: [id number]    Microbiology: Results for orders placed or performed during the hospital encounter of 05/16/24  Resp panel by RT-PCR (RSV, Flu A&B, Covid) Anterior Nasal Swab     Status: None   Collection Time: 05/16/24  9:38 PM   Specimen: Anterior Nasal Swab  Result Value Ref Range Status   SARS Coronavirus 2 by RT PCR NEGATIVE NEGATIVE Final   Influenza A by PCR NEGATIVE NEGATIVE Final   Influenza B by PCR NEGATIVE NEGATIVE Final    Comment: (NOTE) The Xpert Xpress SARS-CoV-2/FLU/RSV plus assay is intended as an aid in the diagnosis of influenza from Nasopharyngeal swab specimens and should not be used as a sole basis for treatment. Nasal washings and aspirates are unacceptable for Xpert Xpress SARS-CoV-2/FLU/RSV testing.  Fact  Sheet for Patients: bloggercourse.com  Fact Sheet for Healthcare Providers: seriousbroker.it  This test is not yet approved or cleared by the [place]  FDA and has been authorized for detection and/or diagnosis of SARS-CoV-2 by FDA under an Emergency Use Authorization (EUA). This EUA will remain in effect (meaning this test can be used) for the duration of the COVID-19 declaration under Section 564(b)(1) of the Act, 21  U.S.C. section 360bbb-3(b)(1), unless the authorization is terminated or revoked.     Resp Syncytial Virus by PCR NEGATIVE NEGATIVE Final    Comment: (NOTE) Fact Sheet for Patients: bloggercourse.com  Fact Sheet for Healthcare Providers: seriousbroker.it  This test is not yet approved or cleared by the [place]  FDA and has been authorized for detection and/or diagnosis of SARS-CoV-2 by FDA under an Emergency Use Authorization (EUA). This EUA will remain in effect (meaning this test can be used) for the duration of the COVID-19 declaration under Section 564(b)(1) of the Act, 21 U.S.C. section 360bbb-3(b)(1), unless the authorization is terminated or revoked.  Performed at [facility], [address] [place], [place] [address]   Blood culture (routine x 2)     Status: None   Collection Time: 05/16/24  9:38 PM   Specimen: BLOOD RIGHT ARM  Result Value Ref Range Status   Specimen Description BLOOD RIGHT ARM  Final   Special Requests   Final    BOTTLES DRAWN AEROBIC ONLY Blood Culture adequate volume   Culture   Final    NO GROWTH 5 DAYS Performed at [facility], [address], [place], [place] [address]    Report Status 05/21/2024 FINAL  Final  Blood culture (routine x 2)     Status: None   Collection Time: 05/16/24  9:38 PM   Specimen: BLOOD LEFT ARM  Result Value Ref Range Status   Specimen Description BLOOD LEFT ARM  Final    Special Requests   Final    BOTTLES DRAWN AEROBIC AND ANAEROBIC Blood Culture adequate volume   Culture   Final    NO GROWTH 5 DAYS Performed at [facility], [address], [place], [place] [address]    Report Status 05/21/2024 FINAL  Final  Surgical pcr screen     Status: None   Collection Time: 05/21/24  6:22 AM   Specimen: Nasal Mucosa; Nasal Swab  Result Value Ref Range Status   MRSA, PCR NEGATIVE NEGATIVE Final   Staphylococcus aureus NEGATIVE NEGATIVE Final    Comment: (NOTE) The Xpert SA Assay (FDA approved for NASAL specimens in patients 38 years of age and older), is one component of a comprehensive surveillance program. It is not intended to diagnose infection nor to guide or monitor treatment. Performed at [facility], [address], [place], [place] [address]    Labs: CBC: Recent Labs  Lab 05/19/24 0430 05/21/24 0243 05/22/24 0231 05/23/24 0227 05/24/24 0547  WBC 7.4 20.0* 16.0* 12.0* 12.2*  NEUTROABS  --   --   --  8.3* 9.6*  HGB 13.3 16.0 13.7 13.7 13.3  HCT 38.5* 45.5 39.2 39.8 39.9  MCV 91.4 89.2 90.5 92.1 93.4  PLT 127* 162 159 153 141*   Basic Metabolic Panel: Recent Labs  Lab 05/18/24 0533 05/19/24 0430 05/21/24 0243 05/22/24 0231 05/23/24 0227 05/24/24 0547  NA 141 138 130* 134* 136 135  K 3.6 3.5 3.6 4.3 3.1* 4.0  CL 103 99 90* 95* 94* 97*  CO2 29 32 30 31 33* 31  GLUCOSE 96 148* 202* 198* 150* 141*  BUN 17 11 9 14 17 11   CREATININE 0.46* 0.42* 0.46* 0.46* 0.55* 0.48*  CALCIUM  8.6* 8.8* 9.7 9.4 9.5 9.3  MG 1.7 1.9  --   --  1.9 1.8  PHOS 2.8  --   --   --   --   --    Liver Function Tests:  Recent Labs  Lab 05/22/24 0231 05/23/24 0227 05/24/24 0547  AST 91* 67* 60*  ALT 113* 95* 101*  ALKPHOS 87 87 90  BILITOT 1.3* 1.0 1.0  PROT 6.3* 6.3* 6.1*  ALBUMIN 3.4* 3.4* 3.1*   CBG: Recent Labs  Lab 05/23/24 1117 05/23/24 1539 05/23/24 2154 05/24/24 0807 05/24/24 1130  GLUCAP 257* 156* 120* 130* 238*     Discharge time spent: 40 minutes  Author: Yetta Blanch, MD  [facility]    "

## 2024-05-24 NOTE — Plan of Care (Signed)

## 2024-05-24 NOTE — TOC Transition Note (Signed)
 Transition of Care [facility]) - Discharge Note   Patient Details  Name: Bradley Mcclure MRN: [id number] Date of Birth: 1960-12-19  [facility]) CM/SW Contact:  Inocente GORMAN Kindle, LCSW Phone Number: 05/24/2024, 4:08 PM   Clinical Narrative:    Patient will DC to: [facility] Anticipated DC date: 05/24/24 Family notified: Notified APS worker Transport by: [place]   Per MD patient ready for DC to [facility]. RN to call report prior to discharge [phone] room 121a). RN, patient, patient's family, and facility notified of DC. Discharge Summary and FL2 sent to facility. DC packet on chart including signed script. Ambulance transport requested for patient.   CSW will sign off for now as social work intervention is no longer needed. Please consult us  again if new needs arise.     Final next level of care: Skilled Nursing Facility Barriers to Discharge: Barriers Resolved   Patient Goals and CMS Choice Patient states their goals for this hospitalization and ongoing recovery are:: rehab CMS Medicare.gov Compare Post Acute Care list provided to:: Patient Choice offered to / list presented to : Patient [place] ownership interest in [facility].provided to:: Patient    Discharge Placement   Existing PASRR number confirmed : 05/24/24          Patient chooses bed at: [facility] Patient to be transferred to facility by: [facility] Name of family member notified: na Patient and family notified of of transfer: 05/24/24  Discharge Plan and Services Additional resources added to the After Visit Summary for   In-house Referral: Clinical Social Work Discharge Planning Services: CM Consult                                 Social Drivers of Health (SDOH) Interventions SDOH Screenings   Food Insecurity: No Food Insecurity (05/17/2024)  Housing: Low Risk (05/17/2024)  Transportation Needs: No Transportation Needs (05/17/2024)  Utilities: Not At Risk  (05/17/2024)  Tobacco Use: Low Risk (05/21/2024)     Readmission Risk Interventions     No data to display

## 2024-05-24 NOTE — TOC Progression Note (Signed)
 Transition of Care [facility]) - Progression Note    Patient Details  Name: Bradley Mcclure MRN: [id number] Date of Birth: 12-20-1960  [facility]) CM/SW Contact  Inocente GORMAN Kindle, LCSW Phone Number: 05/24/2024, 2:43 PM  Clinical Narrative:    [facility] able to accept patient today. CSW left vm for [facility] with APS providing update on DC date as requested. Patient will require [facility] for transport.    Expected Discharge Plan:  (TBD) Barriers to Discharge: Barriers Resolved               Expected Discharge Plan and Services In-house Referral: Clinical Social Work Discharge Planning Services: CM Consult   Living arrangements for the past 2 months: Apartment Expected Discharge Date: 05/24/24                                     Social Drivers of Health (SDOH) Interventions SDOH Screenings   Food Insecurity: No Food Insecurity (05/17/2024)  Housing: Low Risk (05/17/2024)  Transportation Needs: No Transportation Needs (05/17/2024)  Utilities: Not At Risk (05/17/2024)  Tobacco Use: Low Risk (05/21/2024)    Readmission Risk Interventions     No data to display

## 2024-05-24 NOTE — Plan of Care (Signed)
" °  Problem: Education: Goal: Ability to describe self-care measures that may prevent or decrease complications (Diabetes Survival Skills Education) will improve Outcome: Completed/Met Goal: Individualized Educational Video(s) Outcome: Completed/Met   Problem: Coping: Goal: Ability to adjust to condition or change in health will improve Outcome: Completed/Met   Problem: Fluid Volume: Goal: Ability to maintain a balanced intake and output will improve Outcome: Completed/Met   Problem: Health Behavior/Discharge Planning: Goal: Ability to identify and utilize available resources and services will improve Outcome: Completed/Met Goal: Ability to manage health-related needs will improve Outcome: Completed/Met   Problem: Metabolic: Goal: Ability to maintain appropriate glucose levels will improve Outcome: Completed/Met   Problem: Nutritional: Goal: Maintenance of adequate nutrition will improve Outcome: Completed/Met Goal: Progress toward achieving an optimal weight will improve Outcome: Completed/Met   Problem: Skin Integrity: Goal: Risk for impaired skin integrity will decrease Outcome: Completed/Met   Problem: Tissue Perfusion: Goal: Adequacy of tissue perfusion will improve Outcome: Completed/Met   Problem: Education: Goal: Knowledge of General Education information will improve Description: Including pain rating scale, medication(s)/side effects and non-pharmacologic comfort measures Outcome: Completed/Met   Problem: Health Behavior/Discharge Planning: Goal: Ability to manage health-related needs will improve Outcome: Completed/Met   Problem: Clinical Measurements: Goal: Ability to maintain clinical measurements within normal limits will improve Outcome: Completed/Met Goal: Will remain free from infection Outcome: Completed/Met Goal: Diagnostic test results will improve Outcome: Completed/Met Goal: Respiratory complications will improve Outcome: Completed/Met Goal:  Cardiovascular complication will be avoided Outcome: Completed/Met   Problem: Activity: Goal: Risk for activity intolerance will decrease Outcome: Completed/Met   Problem: Nutrition: Goal: Adequate nutrition will be maintained Outcome: Completed/Met   Problem: Coping: Goal: Level of anxiety will decrease Outcome: Completed/Met   Problem: Elimination: Goal: Will not experience complications related to bowel motility Outcome: Completed/Met Goal: Will not experience complications related to urinary retention Outcome: Completed/Met   Problem: Pain Managment: Goal: General experience of comfort will improve and/or be controlled Outcome: Completed/Met   Problem: Safety: Goal: Ability to remain free from injury will improve Outcome: Completed/Met   Problem: Skin Integrity: Goal: Risk for impaired skin integrity will decrease Outcome: Completed/Met   Problem: Education: Goal: Knowledge of the prescribed therapeutic regimen will improve Outcome: Completed/Met   Problem: Bowel/Gastric: Goal: Gastrointestinal status for postoperative course will improve Outcome: Completed/Met   Problem: Cardiac: Goal: Ability to maintain an adequate cardiac output Outcome: Completed/Met Goal: Will show no evidence of cardiac arrhythmias Outcome: Completed/Met   Problem: Nutritional: Goal: Will attain and maintain optimal nutritional status Outcome: Completed/Met   Problem: Neurological: Goal: Will regain or maintain usual level of consciousness Outcome: Completed/Met   Problem: Clinical Measurements: Goal: Ability to maintain clinical measurements within normal limits Outcome: Completed/Met Goal: Postoperative complications will be avoided or minimized Outcome: Completed/Met   Problem: Respiratory: Goal: Will regain and/or maintain adequate ventilation Outcome: Completed/Met Goal: Respiratory status will improve Outcome: Completed/Met   Problem: Skin Integrity: Goal:  Demonstrates signs of wound healing without infection Outcome: Completed/Met   Problem: Urinary Elimination: Goal: Will remain free from infection Outcome: Completed/Met Goal: Ability to achieve and maintain adequate urine output Outcome: Completed/Met   "

## 2024-05-24 NOTE — Progress Notes (Signed)
 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  Evaluated by neurosurgery-recommendations were to hold anticoagulation and proceed with cervical decompression on 2/3. C4-6 decompression completed on 2/3.  PMH significant for esophageal dysphagia (? Prior cricopharyngeal narrowing), cognitive dysphagia, PE on anticoagulation, diabetes type 2, hypertension, hyperlipidemia, NAFLD, depression, cognitive impairment, GERD.    PT Comments  Pt received in supine and agreeable to session. Pt requires up to mod A for bed mobility and transfers. Pt demonstrates impaired awareness with tendency to lateral lean requiring cues to correct. Improved steps this session with low foot clearance. Pt able to perform 2 trails of static standing marches with a seated rest break between. Pt with poor eccentric control when sitting despite cues. SpO2 >93% on RA during mobility. Pt continues to benefit from PT services to progress toward functional mobility goals.    If plan is discharge home, recommend the following: A lot of help with bathing/dressing/bathroom;A lot of help with walking and/or transfers;Assistance with cooking/housework;Assist for transportation;Direct supervision/assist for medications management;Direct supervision/assist for financial management;Supervision due to cognitive status;Help with stairs or ramp for entrance   Can travel by private vehicle     No  Equipment Recommendations  Rolling walker (2 wheels);BSC/3in1    Recommendations for Other Services       Precautions / Restrictions Precautions Precautions: Fall;Cervical Recall of Precautions/Restrictions:  Impaired Precaution/Restrictions Comments: Aspen collar available as needed for  comfort, no strict c-spine precautions but would benefit from following BLTs. Cervical Brace: Hard collar;For comfort Restrictions Weight Bearing Restrictions Per Provider Order: No     Mobility  Bed Mobility Overal bed mobility: Needs Assistance Bed Mobility: Rolling, Sidelying to Sit Rolling: Min assist Sidelying to sit: Mod assist       General bed mobility comments: cues for technique and assist for trunk elevation and scooting forward to EOB    Transfers Overall transfer level: Needs assistance Equipment used: Rolling walker (2 wheels) Transfers: Sit to/from Stand, Bed to chair/wheelchair/BSC Sit to Stand: Mod assist   Step pivot transfers: Min assist       General transfer comment: STS from EOB and recliner with mod A for power up and cues each trial for hand placement. Poor eccentric control during descent to chair. Cues for direction to recliner, but improved stepping and stability this session    Ambulation/Gait             Pre-gait activities: (P) static standing marches General Gait Details: deferred due to lack of +2   Stairs             Wheelchair Mobility     Tilt Bed    Modified Rankin (Stroke Patients Only)       Balance Overall balance assessment: Needs assistance Sitting-balance support: Feet supported, Bilateral upper extremity supported Sitting balance-Leahy Scale: Poor Sitting balance - Comments: CGA with R lateral lean, but pt able to correct with cues   Standing balance support: Bilateral upper extremity supported, During functional activity, Reliant on assistive device for balance Standing balance-Leahy Scale: Poor Standing balance comment: Reliant on RW support                            Communication Communication Communication: Impaired  Factors Affecting Communication: Difficulty expressing self;Reduced clarity of speech   Cognition Arousal: Alert Behavior During Therapy: Flat affect   PT - Cognitive impairments: Awareness, Safety/Judgement, Problem solving, Sequencing, Initiation, Attention                       PT - Cognition Comments: intermittently irritable and poor awareness Following commands: Impaired Following commands impaired: Follows one step commands with increased time    Cueing Cueing Techniques: Verbal cues, Visual cues, Tactile cues  Exercises      General Comments        Pertinent Vitals/Pain Pain Assessment Pain Assessment: 0-10 Pain Score: 2  Pain Location: Neck Pain Descriptors / Indicators: Aching, Sore Pain Intervention(s): Limited activity within patient's tolerance, Monitored during session, Repositioned     PT Goals (current goals can now be found in the care plan section) Acute Rehab PT Goals Patient Stated Goal: Patient's goal is to improve balance and return home. PT Goal Formulation: With patient Time For Goal Achievement: 06/05/24 Progress towards PT goals: Progressing toward goals    Frequency    Min 2X/week       AM-PAC PT 6 Clicks Mobility   Outcome Measure  Help needed turning from your back to your side while in a flat bed without using bedrails?: A Little Help needed moving from lying on your back to sitting on the side of a flat bed without using bedrails?: A Lot Help needed moving to and from a bed to a chair (including a wheelchair)?: A Little Help needed standing up from a chair using your arms (e.g., wheelchair or bedside chair)?: A Lot Help needed to walk in hospital room?: Total Help needed climbing 3-5 steps with a railing? : Total 6 Click Score: 12    End of Session Equipment Utilized During Treatment: Gait belt Activity Tolerance: Patient tolerated treatment well Patient left: with call bell/phone within reach;in chair;with chair alarm set Nurse Communication: Mobility status;Precautions PT Visit Diagnosis:  Unsteadiness on feet (R26.81);Other abnormalities of gait and mobility (R26.89);Difficulty in walking, not elsewhere classified (R26.2);Repeated falls (R29.6);Muscle weakness (generalized) (M62.81);History of falling (Z91.81)     Time: 9142-9081 PT Time Calculation (min) (ACUTE ONLY): 21 min  Charges:    $Therapeutic Activity: 8-22 mins PT General Charges $$ ACUTE PT VISIT: 1 Visit                    Darryle George, PTA Acute Rehabilitation Services Secure Chat Preferred  [phone]    Darryle George 05/24/2024, 10:07 AM
